# Patient Record
Sex: Female | Born: 1965
Health system: Southern US, Community
[De-identification: ages and names within clinical notes are randomized; demographics above are authoritative.]

## PROBLEM LIST (undated history)

## (undated) DIAGNOSIS — K7460 Unspecified cirrhosis of liver: Secondary | ICD-10-CM

## (undated) DIAGNOSIS — I864 Gastric varices: Secondary | ICD-10-CM

## (undated) DIAGNOSIS — R600 Localized edema: Secondary | ICD-10-CM

## (undated) DIAGNOSIS — R0683 Snoring: Secondary | ICD-10-CM

## (undated) DIAGNOSIS — K429 Umbilical hernia without obstruction or gangrene: Secondary | ICD-10-CM

## (undated) DIAGNOSIS — E119 Type 2 diabetes mellitus without complications: Secondary | ICD-10-CM

## (undated) DIAGNOSIS — Z460 Encounter for fitting and adjustment of spectacles and contact lenses: Secondary | ICD-10-CM

## (undated) DIAGNOSIS — K219 Gastro-esophageal reflux disease without esophagitis: Secondary | ICD-10-CM

## (undated) DIAGNOSIS — M2550 Pain in unspecified joint: Secondary | ICD-10-CM

## (undated) DIAGNOSIS — K743 Primary biliary cirrhosis: Secondary | ICD-10-CM

## (undated) DIAGNOSIS — T7840XA Allergy, unspecified, initial encounter: Secondary | ICD-10-CM

## (undated) DIAGNOSIS — R011 Cardiac murmur, unspecified: Secondary | ICD-10-CM

## (undated) DIAGNOSIS — D649 Anemia, unspecified: Secondary | ICD-10-CM

## (undated) DIAGNOSIS — Z91018 Allergy to other foods: Secondary | ICD-10-CM

## (undated) DIAGNOSIS — K5900 Constipation, unspecified: Secondary | ICD-10-CM

## (undated) DIAGNOSIS — E785 Hyperlipidemia, unspecified: Secondary | ICD-10-CM

## (undated) DIAGNOSIS — E559 Vitamin D deficiency, unspecified: Secondary | ICD-10-CM

## (undated) DIAGNOSIS — I10 Essential (primary) hypertension: Secondary | ICD-10-CM

## (undated) HISTORY — DX: Vitamin D deficiency, unspecified: E55.9

## (undated) HISTORY — DX: Hyperlipidemia, unspecified: E78.5

## (undated) HISTORY — PX: LEEP: SHX91

## (undated) HISTORY — DX: Essential (primary) hypertension: I10

## (undated) HISTORY — DX: Pain in unspecified joint: M25.50

## (undated) HISTORY — DX: Type 2 diabetes mellitus without complications: E11.9

## (undated) HISTORY — DX: Cardiac murmur, unspecified: R01.1

## (undated) HISTORY — DX: Localized edema: R60.0

## (undated) HISTORY — DX: Umbilical hernia without obstruction or gangrene: K42.9

## (undated) HISTORY — DX: Gastric varices: I86.4

## (undated) HISTORY — DX: Allergy to other foods: Z91.018

## (undated) HISTORY — DX: Allergy, unspecified, initial encounter: T78.40XA

## (undated) HISTORY — DX: Anemia, unspecified: D64.9

## (undated) HISTORY — PX: DILATION AND CURETTAGE OF UTERUS: SHX78

## (undated) HISTORY — PX: LIVER BIOPSY: SHX301

## (undated) HISTORY — DX: Primary biliary cirrhosis: K74.3

## (undated) HISTORY — DX: Gastro-esophageal reflux disease without esophagitis: K21.9

## (undated) HISTORY — DX: Unspecified cirrhosis of liver: K74.60

## (undated) HISTORY — DX: Constipation, unspecified: K59.00

---

## 1997-10-25 HISTORY — PX: TUBAL LIGATION: SHX77

## 2003-04-22 ENCOUNTER — Encounter: Payer: Self-pay | Admitting: Emergency Medicine

## 2003-04-22 ENCOUNTER — Emergency Department (HOSPITAL_COMMUNITY): Admission: EM | Admit: 2003-04-22 | Discharge: 2003-04-22 | Payer: Self-pay | Admitting: Emergency Medicine

## 2009-06-15 ENCOUNTER — Encounter: Admission: RE | Admit: 2009-06-15 | Discharge: 2009-06-15 | Payer: Self-pay | Admitting: Obstetrics and Gynecology

## 2010-06-16 ENCOUNTER — Encounter: Admission: RE | Admit: 2010-06-16 | Discharge: 2010-06-16 | Payer: Self-pay | Admitting: Obstetrics and Gynecology

## 2011-07-30 ENCOUNTER — Other Ambulatory Visit: Payer: Self-pay | Admitting: Obstetrics and Gynecology

## 2011-07-30 DIAGNOSIS — Z1231 Encounter for screening mammogram for malignant neoplasm of breast: Secondary | ICD-10-CM

## 2011-08-17 ENCOUNTER — Ambulatory Visit
Admission: RE | Admit: 2011-08-17 | Discharge: 2011-08-17 | Disposition: A | Discharge status: Home / Self Care | Payer: BC Managed Care – PPO | Source: Ambulatory Visit | Attending: Obstetrics and Gynecology | Admitting: Obstetrics and Gynecology

## 2011-08-17 DIAGNOSIS — Z1231 Encounter for screening mammogram for malignant neoplasm of breast: Secondary | ICD-10-CM

## 2012-03-25 ENCOUNTER — Other Ambulatory Visit (HOSPITAL_COMMUNITY)
Admission: RE | Admit: 2012-03-25 | Discharge: 2012-03-25 | Disposition: A | Discharge status: Home / Self Care | Payer: BC Managed Care – PPO | Source: Ambulatory Visit | Attending: Obstetrics and Gynecology | Admitting: Obstetrics and Gynecology

## 2012-03-25 ENCOUNTER — Other Ambulatory Visit: Payer: Self-pay | Admitting: Obstetrics and Gynecology

## 2012-03-25 DIAGNOSIS — Z124 Encounter for screening for malignant neoplasm of cervix: Secondary | ICD-10-CM | POA: Insufficient documentation

## 2012-04-23 DIAGNOSIS — Z0000 Encounter for general adult medical examination without abnormal findings: Secondary | ICD-10-CM | POA: Insufficient documentation

## 2012-04-29 ENCOUNTER — Encounter (INDEPENDENT_AMBULATORY_CARE_PROVIDER_SITE_OTHER): Payer: Self-pay | Admitting: General Surgery

## 2012-04-29 ENCOUNTER — Ambulatory Visit (INDEPENDENT_AMBULATORY_CARE_PROVIDER_SITE_OTHER): Payer: BC Managed Care – PPO | Admitting: General Surgery

## 2012-04-29 VITALS — BP 122/84 | HR 70 | Temp 97.6°F | Resp 16 | Ht 61.0 in | Wt 239.1 lb

## 2012-04-29 DIAGNOSIS — K429 Umbilical hernia without obstruction or gangrene: Secondary | ICD-10-CM

## 2012-04-29 NOTE — Progress Notes (Signed)
Patient ID: Susan [name], female   DOB: May 01, 1966, 46 y.o.   MRN: [id number]  Chief Complaint  Patient presents with  . Hernia    Peri-umbilical    HPI Susan [name] is a 46 y.o. female.  Umbilical Hernia HPI  Known hernia about one year, now more symptomatic.  No nausea, vomiting, or diarrhea.  No excruciating abdominal pain. Past Medical History  Diagnosis Date  . Hypertension   . Periumbilical hernia     Past Surgical History  Procedure Date  . Tubal ligation 10/1997  . Dilation and curettage of uterus 11/1995, 10/1997    Family History  Problem Relation Age of Onset  . Cancer Mother     breast  . Cancer Maternal Grandmother     lung  . Cancer Maternal Grandfather     lung    Social History History  Substance Use Topics  . Smoking status: Never Smoker   . Smokeless tobacco: Never Used  . Alcohol Use: No    Allergies  Allergen Reactions  . Sulfa Antibiotics Rash and Other (See Comments)    Severe dehydration    Current Outpatient Prescriptions  Medication Sig Dispense Refill  . amlodipine-olmesartan (AZOR) 10-20 MG per tablet Take 1 tablet by mouth daily.        Review of Systems Review of Systems  Constitutional: Negative.   HENT: Negative.   Eyes: Negative.   Respiratory: Negative.   Cardiovascular: Negative.   Gastrointestinal: Positive for abdominal pain (periumbilical).  Genitourinary: Negative.   Musculoskeletal: Negative.   Skin: Negative.   Neurological: Negative.   Hematological: Negative.   Psychiatric/Behavioral: Negative.     Blood pressure 122/84, pulse 70, temperature 97.6 F (36.4 C), temperature source Temporal, resp. rate 16, height 5\' 1"  (1.549 m), weight 239 lb 2 oz (108.466 kg).  Physical Exam Physical Exam  Constitutional: She appears well-developed and well-nourished.  HENT:  Head: Normocephalic and atraumatic.  Eyes: Conjunctivae and EOM are normal. Pupils are equal, round, and reactive to light.  Neck: Normal  range of motion. Neck supple.  Cardiovascular: Normal rate, regular rhythm and normal heart sounds.   Pulmonary/Chest: Effort normal and breath sounds normal.  Abdominal: Soft. Bowel sounds are normal. There is tenderness in the periumbilical area. A hernia (umbilical in the superior aspect) is present.    Musculoskeletal: Normal range of motion.  Skin: Skin is warm and dry.  Psychiatric: She has a normal mood and affect. Her behavior is normal. Thought content normal.    Data Reviewed None  Assessment    Umbilical hernia with symptoms    Plan    Umbilical hernia repair with mesh.   Patient to call back to schedule.       Cherylynn Ridges 04/29/2012, 12:54 PM

## 2012-09-05 ENCOUNTER — Other Ambulatory Visit: Payer: Self-pay | Admitting: Obstetrics and Gynecology

## 2012-09-05 DIAGNOSIS — Z803 Family history of malignant neoplasm of breast: Secondary | ICD-10-CM

## 2012-09-05 DIAGNOSIS — Z1231 Encounter for screening mammogram for malignant neoplasm of breast: Secondary | ICD-10-CM

## 2012-10-17 ENCOUNTER — Ambulatory Visit
Admission: RE | Admit: 2012-10-17 | Discharge: 2012-10-17 | Disposition: A | Discharge status: Home / Self Care | Payer: Self-pay | Source: Ambulatory Visit | Attending: Obstetrics and Gynecology | Admitting: Obstetrics and Gynecology

## 2012-10-17 DIAGNOSIS — Z1231 Encounter for screening mammogram for malignant neoplasm of breast: Secondary | ICD-10-CM

## 2012-10-17 DIAGNOSIS — Z803 Family history of malignant neoplasm of breast: Secondary | ICD-10-CM

## 2013-07-14 ENCOUNTER — Encounter (INDEPENDENT_AMBULATORY_CARE_PROVIDER_SITE_OTHER): Payer: Self-pay | Admitting: General Surgery

## 2013-07-14 ENCOUNTER — Ambulatory Visit (INDEPENDENT_AMBULATORY_CARE_PROVIDER_SITE_OTHER): Payer: BC Managed Care – PPO | Admitting: General Surgery

## 2013-07-14 VITALS — BP 160/110 | HR 68 | Temp 97.4°F | Resp 14 | Ht 60.0 in | Wt 242.4 lb

## 2013-07-14 DIAGNOSIS — K429 Umbilical hernia without obstruction or gangrene: Secondary | ICD-10-CM

## 2013-07-14 NOTE — Progress Notes (Signed)
Comes in with a continuing supraumbilical hernia. She's also moderately hypertensive with a blood pressure 160/110.  On examination the hernia appears to be slightly larger than previously. The last I saw her was in August of 2013. It is still mostly reducible but I could not reduce the hernia to where I could feel the fascial edges.  The patient wants surgical repair at this time we will go ahead and get it scheduled.  Her hypertension appears to be spurious but her last blood pressure on this visit 122/84. If this continues to be a problem we will get it reevaluated by primary care physician. 

## 2013-08-05 ENCOUNTER — Encounter (HOSPITAL_BASED_OUTPATIENT_CLINIC_OR_DEPARTMENT_OTHER): Payer: Self-pay | Admitting: *Deleted

## 2013-08-05 NOTE — Progress Notes (Signed)
08/05/13 1734  OBSTRUCTIVE SLEEP APNEA  Have you ever been diagnosed with sleep apnea through a sleep study? No  Do you snore loudly (loud enough to be heard through closed doors)?  1  Do you often feel tired, fatigued, or sleepy during the daytime? 1  Has anyone observed you stop breathing during your sleep? 1  Do you have, or are you being treated for high blood pressure? 0  BMI more than 35 kg/m2? 1  Age over 46 years old? 0  Gender: 0  Obstructive Sleep Apnea Score 4  Score 4 or greater  Results sent to PCP

## 2013-08-05 NOTE — Progress Notes (Signed)
Pt probably has sleep apnea-will go ahead and pack overnight bag-was told she may stay if she has problems-we would keep her safe, To come in for ekg per [facility]-

## 2013-08-06 ENCOUNTER — Encounter (HOSPITAL_BASED_OUTPATIENT_CLINIC_OR_DEPARTMENT_OTHER)
Admission: RE | Admit: 2013-08-06 | Discharge: 2013-08-06 | Disposition: A | Discharge status: Home / Self Care | Payer: BC Managed Care – PPO | Source: Ambulatory Visit | Attending: General Surgery | Admitting: General Surgery

## 2013-08-06 DIAGNOSIS — Z01810 Encounter for preprocedural cardiovascular examination: Secondary | ICD-10-CM | POA: Insufficient documentation

## 2013-08-11 ENCOUNTER — Encounter (HOSPITAL_BASED_OUTPATIENT_CLINIC_OR_DEPARTMENT_OTHER): Payer: BC Managed Care – PPO | Admitting: Certified Registered Nurse Anesthetist

## 2013-08-11 ENCOUNTER — Ambulatory Visit (HOSPITAL_BASED_OUTPATIENT_CLINIC_OR_DEPARTMENT_OTHER): Payer: BC Managed Care – PPO | Admitting: Certified Registered Nurse Anesthetist

## 2013-08-11 ENCOUNTER — Encounter (HOSPITAL_BASED_OUTPATIENT_CLINIC_OR_DEPARTMENT_OTHER)
Admission: RE | Disposition: A | Discharge status: Home / Self Care | Payer: Self-pay | Source: Ambulatory Visit | Attending: General Surgery

## 2013-08-11 ENCOUNTER — Ambulatory Visit (HOSPITAL_BASED_OUTPATIENT_CLINIC_OR_DEPARTMENT_OTHER)
Admission: RE | Admit: 2013-08-11 | Discharge: 2013-08-11 | Disposition: A | Discharge status: Home / Self Care | Payer: BC Managed Care – PPO | Source: Ambulatory Visit | Attending: General Surgery | Admitting: General Surgery

## 2013-08-11 ENCOUNTER — Encounter (HOSPITAL_BASED_OUTPATIENT_CLINIC_OR_DEPARTMENT_OTHER): Payer: Self-pay

## 2013-08-11 DIAGNOSIS — E119 Type 2 diabetes mellitus without complications: Secondary | ICD-10-CM | POA: Insufficient documentation

## 2013-08-11 DIAGNOSIS — K439 Ventral hernia without obstruction or gangrene: Secondary | ICD-10-CM

## 2013-08-11 DIAGNOSIS — I10 Essential (primary) hypertension: Secondary | ICD-10-CM | POA: Insufficient documentation

## 2013-08-11 DIAGNOSIS — K429 Umbilical hernia without obstruction or gangrene: Secondary | ICD-10-CM

## 2013-08-11 HISTORY — DX: Snoring: R06.83

## 2013-08-11 HISTORY — PX: UMBILICAL HERNIA REPAIR: SHX196

## 2013-08-11 HISTORY — DX: Encounter for fitting and adjustment of spectacles and contact lenses: Z46.0

## 2013-08-11 LAB — POCT I-STAT, CHEM 8
BUN: 10 mg/dL (ref 6–23)
Calcium, Ion: 1.06 mmol/L — ABNORMAL LOW (ref 1.12–1.23)
Chloride: 104 mEq/L (ref 96–112)
Creatinine, Ser: 0.9 mg/dL (ref 0.50–1.10)
Glucose, Bld: 161 mg/dL — ABNORMAL HIGH (ref 70–99)
HCT: 45 % (ref 36.0–46.0)
Hemoglobin: 15.3 g/dL — ABNORMAL HIGH (ref 12.0–15.0)
Potassium: 4.4 mEq/L (ref 3.5–5.1)
Sodium: 137 mEq/L (ref 135–145)
TCO2: 25 mmol/L (ref 0–100)

## 2013-08-11 LAB — GLUCOSE, CAPILLARY: Glucose-Capillary: 205 mg/dL — ABNORMAL HIGH (ref 70–99)

## 2013-08-11 SURGERY — REPAIR, HERNIA, UMBILICAL, ADULT
Anesthesia: General | Site: Abdomen | Wound class: Clean

## 2013-08-11 MED ORDER — OXYCODONE-ACETAMINOPHEN 5-325 MG PO TABS: 1.0000 | ORAL_TABLET | ORAL | Status: DC | PRN

## 2013-08-11 MED ORDER — SUCCINYLCHOLINE CHLORIDE 20 MG/ML IJ SOLN
INTRAMUSCULAR | Status: DC | PRN
  Administered 2013-08-11: 100 mg via INTRAVENOUS

## 2013-08-11 MED ORDER — KETOROLAC TROMETHAMINE 30 MG/ML IJ SOLN: 15.0000 mg | Freq: Once | INTRAMUSCULAR | Status: DC | PRN

## 2013-08-11 MED ORDER — MIDAZOLAM HCL 5 MG/5ML IJ SOLN
INTRAMUSCULAR | Status: DC | PRN
  Administered 2013-08-11: 2 mg via INTRAVENOUS

## 2013-08-11 MED ORDER — FENTANYL CITRATE 0.05 MG/ML IJ SOLN: 50.0000 ug | INTRAMUSCULAR | Status: DC | PRN

## 2013-08-11 MED ORDER — HYDROMORPHONE HCL PF 1 MG/ML IJ SOLN
0.2500 mg | INTRAMUSCULAR | Status: DC | PRN
  Administered 2013-08-11 (×4): 0.5 mg via INTRAVENOUS

## 2013-08-11 MED ORDER — ONDANSETRON HCL 4 MG/2ML IJ SOLN: 4.0000 mg | Freq: Once | INTRAMUSCULAR | Status: DC | PRN

## 2013-08-11 MED ORDER — FENTANYL CITRATE 0.05 MG/ML IJ SOLN
INTRAMUSCULAR | Status: DC | PRN
  Administered 2013-08-11: 100 ug via INTRAVENOUS

## 2013-08-11 MED ORDER — CHLORHEXIDINE GLUCONATE 4 % EX LIQD: 1.0000 "application " | Freq: Once | CUTANEOUS | Status: DC

## 2013-08-11 MED ORDER — CEFAZOLIN SODIUM-DEXTROSE 2-3 GM-% IV SOLR
2.0000 g | INTRAVENOUS | Status: AC
  Administered 2013-08-11: 2 g via INTRAVENOUS

## 2013-08-11 MED ORDER — HYDROMORPHONE HCL PF 1 MG/ML IJ SOLN
INTRAMUSCULAR | Status: AC
  Filled 2013-08-11: qty 1

## 2013-08-11 MED ORDER — EPHEDRINE SULFATE 50 MG/ML IJ SOLN
INTRAMUSCULAR | Status: DC | PRN
  Administered 2013-08-11: 10 mg via INTRAVENOUS

## 2013-08-11 MED ORDER — SODIUM CHLORIDE 0.9 % IR SOLN
Status: DC | PRN
  Administered 2013-08-11: 09:00:00

## 2013-08-11 MED ORDER — LABETALOL HCL 5 MG/ML IV SOLN
INTRAVENOUS | Status: AC
  Filled 2013-08-11: qty 4

## 2013-08-11 MED ORDER — PHENYLEPHRINE HCL 10 MG/ML IJ SOLN
INTRAMUSCULAR | Status: DC | PRN
  Administered 2013-08-11 (×2): 40 ug via INTRAVENOUS

## 2013-08-11 MED ORDER — LABETALOL HCL 5 MG/ML IV SOLN
5.0000 mg | INTRAVENOUS | Status: DC | PRN
  Administered 2013-08-11 (×2): 5 mg via INTRAVENOUS

## 2013-08-11 MED ORDER — LIDOCAINE HCL (CARDIAC) 20 MG/ML IV SOLN
INTRAVENOUS | Status: DC | PRN
  Administered 2013-08-11: 75 mg via INTRAVENOUS

## 2013-08-11 MED ORDER — MIDAZOLAM HCL 2 MG/2ML IJ SOLN: 1.0000 mg | INTRAMUSCULAR | Status: DC | PRN

## 2013-08-11 MED ORDER — PROPOFOL 10 MG/ML IV EMUL
INTRAVENOUS | Status: AC
  Filled 2013-08-11: qty 50

## 2013-08-11 MED ORDER — LACTATED RINGERS IV SOLN
INTRAVENOUS | Status: DC
  Administered 2013-08-11 (×2): via INTRAVENOUS

## 2013-08-11 MED ORDER — CEFAZOLIN SODIUM 1-5 GM-% IV SOLN
INTRAVENOUS | Status: AC
  Filled 2013-08-11: qty 100

## 2013-08-11 MED ORDER — PROPOFOL 10 MG/ML IV BOLUS
INTRAVENOUS | Status: DC | PRN
  Administered 2013-08-11: 250 mg via INTRAVENOUS
  Administered 2013-08-11: 200 mg via INTRAVENOUS

## 2013-08-11 MED ORDER — ONDANSETRON HCL 4 MG/2ML IJ SOLN
INTRAMUSCULAR | Status: DC | PRN
  Administered 2013-08-11: 4 mg via INTRAVENOUS

## 2013-08-11 MED ORDER — FENTANYL CITRATE 0.05 MG/ML IJ SOLN
INTRAMUSCULAR | Status: AC
  Filled 2013-08-11: qty 6

## 2013-08-11 MED ORDER — MIDAZOLAM HCL 2 MG/2ML IJ SOLN
INTRAMUSCULAR | Status: AC
  Filled 2013-08-11: qty 2

## 2013-08-11 MED ORDER — BUPIVACAINE HCL (PF) 0.5 % IJ SOLN
INTRAMUSCULAR | Status: AC
  Filled 2013-08-11: qty 30

## 2013-08-11 MED ORDER — LABETALOL HCL 5 MG/ML IV SOLN
INTRAVENOUS | Status: DC | PRN
  Administered 2013-08-11: 10 mg via INTRAVENOUS

## 2013-08-11 MED ORDER — BUPIVACAINE HCL (PF) 0.5 % IJ SOLN
INTRAMUSCULAR | Status: DC | PRN
  Administered 2013-08-11: 9 mL

## 2013-08-11 SURGICAL SUPPLY — 66 items
BAG DECANTER FOR FLEXI CONT (MISCELLANEOUS) ×3 IMPLANT
BINDER ABD UNIV 12 30-45 (MISCELLANEOUS) ×2 IMPLANT
BINDER ABDOMINAL 12 (MISCELLANEOUS) ×3
BLADE SURG 10 STRL SS (BLADE) ×3 IMPLANT
BLADE SURG 15 STRL LF DISP TIS (BLADE) ×2 IMPLANT
BLADE SURG 15 STRL SS (BLADE) ×1
BLADE SURG ROTATE 9660 (MISCELLANEOUS) IMPLANT
CANISTER SUCT 1200ML W/VALVE (MISCELLANEOUS) ×3 IMPLANT
CHLORAPREP W/TINT 26ML (MISCELLANEOUS) ×3 IMPLANT
CLEANER CAUTERY TIP 5X5 PAD (MISCELLANEOUS) ×2 IMPLANT
CONT SPEC 4OZ CLIKSEAL STRL BL (MISCELLANEOUS) IMPLANT
COVER MAYO STAND STRL (DRAPES) ×3 IMPLANT
COVER TABLE BACK 60X90 (DRAPES) ×3 IMPLANT
DECANTER SPIKE VIAL GLASS SM (MISCELLANEOUS) IMPLANT
DERMABOND ADVANCED (GAUZE/BANDAGES/DRESSINGS) ×1
DERMABOND ADVANCED .7 DNX12 (GAUZE/BANDAGES/DRESSINGS) ×2 IMPLANT
DRAPE LAPAROTOMY TRNSV 102X78 (DRAPE) ×3 IMPLANT
DRAPE UTILITY XL STRL (DRAPES) ×3 IMPLANT
DRSG TEGADERM 2-3/8X2-3/4 SM (GAUZE/BANDAGES/DRESSINGS) IMPLANT
DRSG TEGADERM 4X4.75 (GAUZE/BANDAGES/DRESSINGS) ×6 IMPLANT
ELECT REM PT RETURN 9FT ADLT (ELECTROSURGICAL) ×3
ELECTRODE REM PT RTRN 9FT ADLT (ELECTROSURGICAL) ×2 IMPLANT
GLOVE BIO SURGEON STRL SZ 6.5 (GLOVE) ×3 IMPLANT
GLOVE BIOGEL PI IND STRL 7.0 (GLOVE) ×2 IMPLANT
GLOVE BIOGEL PI IND STRL 7.5 (GLOVE) ×2 IMPLANT
GLOVE BIOGEL PI IND STRL 8 (GLOVE) ×2 IMPLANT
GLOVE BIOGEL PI INDICATOR 7.0 (GLOVE) ×1
GLOVE BIOGEL PI INDICATOR 7.5 (GLOVE) ×1
GLOVE BIOGEL PI INDICATOR 8 (GLOVE) ×1
GLOVE ECLIPSE 7.5 STRL STRAW (GLOVE) ×3 IMPLANT
GLOVE SURG SS PI 7.5 STRL IVOR (GLOVE) ×3 IMPLANT
GOWN PREVENTION PLUS XLARGE (GOWN DISPOSABLE) ×6 IMPLANT
GOWN PREVENTION PLUS XXLARGE (GOWN DISPOSABLE) ×3 IMPLANT
NEEDLE HYPO 25X1 1.5 SAFETY (NEEDLE) ×3 IMPLANT
NS IRRIG 1000ML POUR BTL (IV SOLUTION) ×3 IMPLANT
PACK BASIN DAY SURGERY FS (CUSTOM PROCEDURE TRAY) ×3 IMPLANT
PAD CLEANER CAUTERY TIP 5X5 (MISCELLANEOUS) ×1
PENCIL BUTTON HOLSTER BLD 10FT (ELECTRODE) ×3 IMPLANT
SLEEVE SCD COMPRESS KNEE MED (MISCELLANEOUS) ×3 IMPLANT
SPONGE INTESTINAL PEANUT (DISPOSABLE) IMPLANT
SPONGE LAP 4X18 X RAY DECT (DISPOSABLE) ×3 IMPLANT
STAPLER VISISTAT 35W (STAPLE) IMPLANT
STRIP CLOSURE SKIN 1/2X4 (GAUZE/BANDAGES/DRESSINGS) ×3 IMPLANT
SUT ETHIBOND 0 MO6 C/R (SUTURE) IMPLANT
SUT MNCRL AB 4-0 PS2 18 (SUTURE) ×3 IMPLANT
SUT NOVA NAB DX-16 0-1 5-0 T12 (SUTURE) ×9 IMPLANT
SUT NOVA NAB GS-21 0 18 T12 DT (SUTURE) IMPLANT
SUT PROLENE 0 CT 2 (SUTURE) IMPLANT
SUT PROLENE 1 CT (SUTURE) ×6 IMPLANT
SUT VIC AB 3-0 FS2 27 (SUTURE) IMPLANT
SUT VIC AB 3-0 SH 27 (SUTURE) ×1
SUT VIC AB 3-0 SH 27X BRD (SUTURE) ×2 IMPLANT
SUT VIC AB 4-0 RB1 27 (SUTURE)
SUT VIC AB 4-0 RB1 27X BRD (SUTURE) IMPLANT
SUT VIC AB 4-0 SH 27 (SUTURE)
SUT VIC AB 4-0 SH 27XANBCTRL (SUTURE) IMPLANT
SUT VIC AB 5-0 PS2 18 (SUTURE) IMPLANT
SUT VICRYL 4-0 PS2 18IN ABS (SUTURE) IMPLANT
SUT VICRYL AB 2 0 TIE (SUTURE) ×6 IMPLANT
SUT VICRYL AB 2 0 TIES (SUTURE) ×3
SYR BULB 3OZ (MISCELLANEOUS) ×3 IMPLANT
SYR CONTROL 10ML LL (SYRINGE) ×3 IMPLANT
TOWEL OR 17X24 6PK STRL BLUE (TOWEL DISPOSABLE) ×3 IMPLANT
TOWEL OR NON WOVEN STRL DISP B (DISPOSABLE) IMPLANT
TUBE CONNECTING 20X1/4 (TUBING) ×3 IMPLANT
YANKAUER SUCT BULB TIP NO VENT (SUCTIONS) ×3 IMPLANT

## 2013-08-11 NOTE — Transfer of Care (Signed)
Immediate Anesthesia Transfer of Care Note  Patient: Susan [name]  Procedure(s) Performed: Procedure(s): HERNIA REPAIR UMBILICAL ADULT (N/A)  Patient Location: PACU  Anesthesia Type:General  Level of Consciousness: awake and alert   Airway & Oxygen Therapy: Patient Spontanous Breathing and Patient connected to face mask oxygen  Post-op Assessment: Report given to PACU RN and Post -op Vital signs reviewed and stable  Post vital signs: Reviewed and stable  Complications: No apparent anesthesia complications

## 2013-08-11 NOTE — H&P (View-Only) (Signed)
Comes in with a continuing supraumbilical hernia. She's also moderately hypertensive with a blood pressure 160/110.  On examination the hernia appears to be slightly larger than previously. The last I saw her was in August of 2013. It is still mostly reducible but I could not reduce the hernia to where I could feel the fascial edges.  The patient wants surgical repair at this time we will go ahead and get it scheduled.  Her hypertension appears to be spurious but her last blood pressure on this visit 122/84. If this continues to be a problem we will get it reevaluated by primary care physician.

## 2013-08-11 NOTE — Op Note (Signed)
OPERATIVE REPORT  DATE OF OPERATION: 08/11/2013  PATIENT:  Norris Cross  47 y.o. female  PRE-OPERATIVE DIAGNOSIS:  Symptomatic umbilical hernia  POST-OPERATIVE DIAGNOSIS:  Symptomatic periumbilical ventral hernia  PROCEDURE:  Procedure(s): HERNIA REPAIR Peri-UMBILICAL Ventral ADULT  SURGEON:  Surgeon(s): Cherylynn Ridges, MD  ASSISTANT: Salomon Fick  ANESTHESIA:   general  EBL: <30 ml  BLOOD ADMINISTERED: none  DRAINS: none   SPECIMEN:  No Specimen  COUNTS CORRECT:  YES  PROCEDURE DETAILS: The patient was taken to the operating room and placed on the table in the supine position. After an adequate general laryngeal airway anesthetic was administered she was prepped and draped in the usual sterile manner.  After proper time out was performed identifying the patient and the procedure to be performed we marked the patient's abdomen in the supraumbilical area using a marking pen. The incision line was approximately 9 cm long. We made the incision in the supraumbilical area in a curvilinear manner down to subcutaneous tissue. We dissected out the large hernia sac from the subumbilical areaUsing electrocautery. We circumferentially isolated the hernia sac down to the fascial level. There was significant scar tissue from patient's previous operation in the periumbilical area from a previous laparoscopy.  Once we had the hernia defect isolated circumferentially we cut into the sac at the level of the fascia using electrocautery. Contained within the hernia sac was omentum and mesentery and small bowel. We resected some of the excess omentum and fatty tissue and allowed that to fall back into the peritoneal cavity making sure that bleeding structures were ligated with 2-0 Vicryl ties.  Care was taken not to injure the bowel. Once the fascia defect was isolated and the bowel was allowed to fall back into the peritoneal cavity we remain was an approximately 7 x 5 cm defect which we subsequently  closed.  Because of the patient's size and the tendency to be a diabetic and hypertensive is felt as though her risk for recurrence in the future was great. That being the case she was also at risk for infection because of her diabetes. I did not want Add meshto this patient. Therefore the hernia was repaired using interrupted simple stitches of #1 Novafil in a transverse manner. We subsequently reinforce his primary closure with a running #1 Prolene suture back-and-forth tied down on top of the interrupted closure. We subsequently irrigated the area with antibiotic solution. 3-0 Vicryl was used in reapproximate the subcutaneous tissue. We then injected cortisone Marcaine without epinephrine into the subcutaneous tissue. We then closed the skin using running subcuticular stitch of 4-0 Monocryl.  All needle counts, sponge count counts and instrument counts were correct.  PATIENT DISPOSITION:  PACU - hemodynamically stable.   Cherylynn Ridges 11/18/20149:41 AM

## 2013-08-11 NOTE — Interval H&P Note (Signed)
History and Physical Interval Note:  08/11/2013 7:35 AM  Norris Cross  has presented today for surgery, with the diagnosis of Symptomatic umbilical hernia  The various methods of treatment have been discussed with the patient and family. After consideration of risks, benefits and other options for treatment, the patient has consented to  Procedure(s): HERNIA REPAIR UMBILICAL ADULT (N/A) INSERTION OF MESH (N/A) as a surgical intervention .  The patient's history has been reviewed, patient examined, no change in status, stable for surgery.  I have reviewed the patient's chart and labs.  Questions were answered to the patient's satisfaction.    Patient had significant diastolic hypertension on arrival today.  Also, her glucose was elevated.  She was encouraged to see her PCP and to lose weight.  She understand.  Will be able to go through with surgery today.  Marta Lamas. Gae Bon, MD, FACS [phone] [facility] Surgery   Bryttney Netzer, Marta Lamas

## 2013-08-11 NOTE — Anesthesia Postprocedure Evaluation (Signed)
  Anesthesia Post-op Note  Patient: Susan [name]  Procedure(s) Performed: Procedure(s): HERNIA REPAIR UMBILICAL ADULT (N/A)  Patient Location: PACU  Anesthesia Type:General  Level of Consciousness: awake, alert  and oriented  Airway and Oxygen Therapy: Patient Spontanous Breathing and Patient connected to nasal cannula oxygen  Post-op Pain: mild  Post-op Assessment: Post-op Vital signs reviewed, Patient's Cardiovascular Status Stable, Respiratory Function Stable, Patent Airway, No signs of Nausea or vomiting and Pain level controlled  Post-op Vital Signs: stable  Complications: No apparent anesthesia complications

## 2013-08-11 NOTE — Anesthesia Preprocedure Evaluation (Signed)
Anesthesia Evaluation  Patient identified by MRN, date of birth, ID band Patient awake    Reviewed: Allergy & Precautions, H&P , NPO status , Patient's Chart, lab work & pertinent test results  Airway Mallampati: II TM Distance: >3 FB Neck ROM: Full    Dental  (+) Teeth Intact and Dental Advisory Given   Pulmonary  breath sounds clear to auscultation        Cardiovascular hypertension, Rhythm:Regular Rate:Normal     Neuro/Psych    GI/Hepatic   Endo/Other    Renal/GU      Musculoskeletal   Abdominal (+) + obese,   Peds  Hematology   Anesthesia Other Findings   Reproductive/Obstetrics                           Anesthesia Physical Anesthesia Plan  ASA: III  Anesthesia Plan: General   Post-op Pain Management:    Induction: Intravenous  Airway Management Planned: Oral ETT  Additional Equipment:   Intra-op Plan:   Post-operative Plan: Extubation in OR  Informed Consent: I have reviewed the patients History and Physical, chart, labs and discussed the procedure including the risks, benefits and alternatives for the proposed anesthesia with the patient or authorized representative who has indicated his/her understanding and acceptance.   Dental advisory given  Plan Discussed with: CRNA and Anesthesiologist  Anesthesia Plan Comments: (Ventral Hernia Hypertension, not compliant with meds,  GERD Obesity  Plan GA with oral ETT  Kipp Brood, MD)        Anesthesia Quick Evaluation

## 2013-08-11 NOTE — Anesthesia Procedure Notes (Addendum)
Procedure Name: LMA Insertion Date/Time: 08/11/2013 7:51 AM Performed by: Zenia Resides D Pre-anesthesia Checklist: Patient identified, Emergency Drugs available, Suction available and Patient being monitored Patient Re-evaluated:Patient Re-evaluated prior to inductionOxygen Delivery Method: Circle System Utilized Preoxygenation: Pre-oxygenation with 100% oxygen Intubation Type: IV induction Ventilation: Mask ventilation without difficulty LMA: LMA inserted LMA Size: 4.0 Number of attempts: 1 Airway Equipment and Method: bite block Placement Confirmation: positive ETCO2 Tube secured with: Tape Dental Injury: Teeth and Oropharynx as per pre-operative assessment    Procedure Name: Intubation Date/Time: 08/11/2013 8:08 AM Performed by: Zenia Resides D Pre-anesthesia Checklist: Patient identified, Emergency Drugs available, Suction available and Patient being monitored Patient Re-evaluated:Patient Re-evaluated prior to inductionOxygen Delivery Method: Circle System Utilized Preoxygenation: Pre-oxygenation with 100% oxygen Intubation Type: IV induction Ventilation: Mask ventilation without difficulty Grade View: Grade II Tube type: Oral Number of attempts: 1 Airway Equipment and Method: stylet,  oral airway and Video-laryngoscopy Placement Confirmation: ETT inserted through vocal cords under direct vision,  positive ETCO2 and breath sounds checked- equal and bilateral Secured at: 22 cm Tube secured with: Tape Dental Injury: Teeth and Oropharynx as per pre-operative assessment and Bloody posterior oropharynx

## 2013-08-12 ENCOUNTER — Encounter (HOSPITAL_BASED_OUTPATIENT_CLINIC_OR_DEPARTMENT_OTHER): Payer: Self-pay | Admitting: General Surgery

## 2013-08-12 ENCOUNTER — Telehealth (INDEPENDENT_AMBULATORY_CARE_PROVIDER_SITE_OTHER): Payer: Self-pay | Admitting: *Deleted

## 2013-08-12 NOTE — Telephone Encounter (Signed)
Husband called in stating that Percocet is not helping with the pain and asking for something stronger.  Explained that the Percocet is the strongest pain medication we prescribe for this type of surgery.  Encourage per protocol for patient to try to add in Ibuprofen 800mg  every 8 hours as needed for pain.  Explained this helps with inflammation and helps in different ways so maybe adding this in would help.  Husband states understanding and agreeable at this time.

## 2013-08-16 ENCOUNTER — Telehealth (INDEPENDENT_AMBULATORY_CARE_PROVIDER_SITE_OTHER): Payer: Self-pay | Admitting: General Surgery

## 2013-08-16 NOTE — Telephone Encounter (Signed)
Pt called because of redness at her umbilical hernia incision.  She wasn't sure if it was bruising or bright red.  She also had some temperatures of 100-100.5 and some dizziness.  Pain was controlled with ibuprofen.  No n/v.  Had a BM.      I advised her to come to urgent office tomorrow.    I will send messages to dr. Lindie Spruce and his nurse.  i also advised her to drink plenty of fluids.

## 2013-08-17 ENCOUNTER — Ambulatory Visit (INDEPENDENT_AMBULATORY_CARE_PROVIDER_SITE_OTHER): Payer: BC Managed Care – PPO | Admitting: General Surgery

## 2013-08-17 ENCOUNTER — Encounter (INDEPENDENT_AMBULATORY_CARE_PROVIDER_SITE_OTHER): Payer: Self-pay | Admitting: General Surgery

## 2013-08-17 VITALS — BP 154/112 | HR 88 | Temp 97.2°F | Resp 25 | Ht 60.0 in | Wt 235.0 lb

## 2013-08-17 DIAGNOSIS — R0602 Shortness of breath: Secondary | ICD-10-CM | POA: Insufficient documentation

## 2013-08-17 DIAGNOSIS — K429 Umbilical hernia without obstruction or gangrene: Secondary | ICD-10-CM

## 2013-08-17 NOTE — Progress Notes (Signed)
Subjective:     Patient ID: Susan [name], female   DOB: [birth year], 47 y.o.   MRN: [id number]  HPI The patient is a 47 year old black female who is 6 days status post umbilical hernia repair by Dr. Lindie Spruce. She states she had some fever over the weekend but this has resolved. Her husband was concerned that there may be some redness near the incision. Her main complaint is that she feels a little bit short of breath. She has not eaten very much. Her bowels move yesterday for the first time. She has not had any vomiting since surgery.  Review of Systems     Objective:   Physical Exam On exam her abdomen is soft with minimal tenderness. There is no distention. Her incision looks good. There is no surrounding cellulitis. Her lungs are clear bilaterally with no use of accessory respiratory muscles    Assessment:     The patient is 6 days status post umbilical hernia repair     Plan:     At this point I will plan to check a chest x-ray to begin working up her shortness of breath. Her shortness of breath is mottled. She has no leg swelling to suggest clots. If her shortness of breath worsens then she will need a CT angiogram chest to rule out pulmonary emboli. If it gradually gets better then I think this is normal postoperative pain causing her to not take deep breaths.

## 2013-08-17 NOTE — Patient Instructions (Signed)
Call if shortness of breath worsens

## 2013-08-18 ENCOUNTER — Ambulatory Visit
Admission: RE | Admit: 2013-08-18 | Discharge: 2013-08-18 | Disposition: A | Discharge status: Home / Self Care | Payer: BC Managed Care – PPO | Source: Ambulatory Visit | Attending: General Surgery | Admitting: General Surgery

## 2013-08-18 DIAGNOSIS — R0602 Shortness of breath: Secondary | ICD-10-CM

## 2013-09-01 ENCOUNTER — Ambulatory Visit (INDEPENDENT_AMBULATORY_CARE_PROVIDER_SITE_OTHER): Payer: BC Managed Care – PPO | Admitting: General Surgery

## 2013-09-01 ENCOUNTER — Encounter (INDEPENDENT_AMBULATORY_CARE_PROVIDER_SITE_OTHER): Payer: Self-pay | Admitting: General Surgery

## 2013-09-01 ENCOUNTER — Encounter (INDEPENDENT_AMBULATORY_CARE_PROVIDER_SITE_OTHER): Payer: Self-pay

## 2013-09-01 VITALS — BP 158/92 | HR 68 | Temp 98.3°F | Resp 16 | Ht 60.0 in | Wt 236.8 lb

## 2013-09-01 DIAGNOSIS — Z09 Encounter for follow-up examination after completed treatment for conditions other than malignant neoplasm: Secondary | ICD-10-CM | POA: Insufficient documentation

## 2013-09-01 NOTE — Progress Notes (Signed)
The patient is doing well although started complaining of some tenderness and pain in her right lower quadrant. This happened last night as she was laying in bed. I postulated that this is secondary to spasm from sutures placed in the fascia over on that side.  A wound has healed well with no evidence of infection. The firmness around the wound likely represents normal postoperative scarring. She is otherwise doing well. Umbilicus is viable and intact.  She is return to see me on a when necessary basis. She is to continue to wear her binder for a total of 6 weeks from the time of surgery. She may need to get another one from a medical supply store.

## 2014-02-21 ENCOUNTER — Encounter (HOSPITAL_BASED_OUTPATIENT_CLINIC_OR_DEPARTMENT_OTHER): Payer: Self-pay | Admitting: Emergency Medicine

## 2014-02-21 ENCOUNTER — Emergency Department (HOSPITAL_BASED_OUTPATIENT_CLINIC_OR_DEPARTMENT_OTHER)
Admission: EM | Admit: 2014-02-21 | Discharge: 2014-02-21 | Disposition: A | Discharge status: Home / Self Care | Payer: BC Managed Care – PPO | Attending: Emergency Medicine | Admitting: Emergency Medicine

## 2014-02-21 ENCOUNTER — Emergency Department (HOSPITAL_BASED_OUTPATIENT_CLINIC_OR_DEPARTMENT_OTHER): Payer: BC Managed Care – PPO

## 2014-02-21 DIAGNOSIS — L0390 Cellulitis, unspecified: Secondary | ICD-10-CM

## 2014-02-21 DIAGNOSIS — Z3202 Encounter for pregnancy test, result negative: Secondary | ICD-10-CM | POA: Insufficient documentation

## 2014-02-21 DIAGNOSIS — I10 Essential (primary) hypertension: Secondary | ICD-10-CM | POA: Insufficient documentation

## 2014-02-21 DIAGNOSIS — F411 Generalized anxiety disorder: Secondary | ICD-10-CM | POA: Insufficient documentation

## 2014-02-21 DIAGNOSIS — L0201 Cutaneous abscess of face: Secondary | ICD-10-CM | POA: Insufficient documentation

## 2014-02-21 DIAGNOSIS — Z79899 Other long term (current) drug therapy: Secondary | ICD-10-CM | POA: Insufficient documentation

## 2014-02-21 DIAGNOSIS — D72829 Elevated white blood cell count, unspecified: Secondary | ICD-10-CM | POA: Insufficient documentation

## 2014-02-21 DIAGNOSIS — L03211 Cellulitis of face: Secondary | ICD-10-CM

## 2014-02-21 DIAGNOSIS — R229 Localized swelling, mass and lump, unspecified: Secondary | ICD-10-CM | POA: Insufficient documentation

## 2014-02-21 DIAGNOSIS — R220 Localized swelling, mass and lump, head: Secondary | ICD-10-CM

## 2014-02-21 DIAGNOSIS — K219 Gastro-esophageal reflux disease without esophagitis: Secondary | ICD-10-CM | POA: Insufficient documentation

## 2014-02-21 LAB — CBC WITH DIFFERENTIAL/PLATELET
Basophils Absolute: 0 10*3/uL (ref 0.0–0.1)
Basophils Relative: 0 % (ref 0–1)
Eosinophils Absolute: 0.4 10*3/uL (ref 0.0–0.7)
Eosinophils Relative: 4 % (ref 0–5)
HCT: 40.8 % (ref 36.0–46.0)
Hemoglobin: 13.9 g/dL (ref 12.0–15.0)
Lymphocytes Relative: 31 % (ref 12–46)
Lymphs Abs: 3.4 10*3/uL (ref 0.7–4.0)
MCH: 30.2 pg (ref 26.0–34.0)
MCHC: 34.1 g/dL (ref 30.0–36.0)
MCV: 88.5 fL (ref 78.0–100.0)
Monocytes Absolute: 0.7 10*3/uL (ref 0.1–1.0)
Monocytes Relative: 6 % (ref 3–12)
Neutro Abs: 6.3 10*3/uL (ref 1.7–7.7)
Neutrophils Relative %: 58 % (ref 43–77)
Platelets: 327 10*3/uL (ref 150–400)
RBC: 4.61 MIL/uL (ref 3.87–5.11)
RDW: 12.5 % (ref 11.5–15.5)
WBC: 10.9 10*3/uL — ABNORMAL HIGH (ref 4.0–10.5)

## 2014-02-21 LAB — BASIC METABOLIC PANEL
BUN: 10 mg/dL (ref 6–23)
CO2: 26 mEq/L (ref 19–32)
Calcium: 8.3 mg/dL — ABNORMAL LOW (ref 8.4–10.5)
Chloride: 102 mEq/L (ref 96–112)
Creatinine, Ser: 0.7 mg/dL (ref 0.50–1.10)
GFR calc Af Amer: >90 mL/min (ref >90)
GFR calc non Af Amer: >90 mL/min (ref >90)
Glucose, Bld: 165 mg/dL — ABNORMAL HIGH (ref 70–99)
Potassium: 3.4 mEq/L — ABNORMAL LOW (ref 3.7–5.3)
Sodium: 139 mEq/L (ref 137–147)

## 2014-02-21 LAB — PREGNANCY, URINE: Preg Test, Ur: NEGATIVE

## 2014-02-21 MED ORDER — IOHEXOL 300 MG/ML  SOLN: 100.0000 mL | Freq: Once | INTRAMUSCULAR | Status: DC | PRN

## 2014-02-21 MED ORDER — CLINDAMYCIN HCL 300 MG PO CAPS: 300.0000 mg | ORAL_CAPSULE | Freq: Three times a day (TID) | ORAL | Status: DC

## 2014-02-21 MED ORDER — IBUPROFEN 600 MG PO TABS: 600.0000 mg | ORAL_TABLET | Freq: Four times a day (QID) | ORAL | Status: DC | PRN

## 2014-02-21 MED ORDER — LABETALOL HCL 5 MG/ML IV SOLN
10.0000 mg | Freq: Once | INTRAVENOUS | Status: AC
  Administered 2014-02-21: 10 mg via INTRAVENOUS
  Filled 2014-02-21: qty 4

## 2014-02-21 MED ORDER — SODIUM CHLORIDE 0.9 % IV BOLUS (SEPSIS)
1000.0000 mL | Freq: Once | INTRAVENOUS | Status: AC
  Administered 2014-02-21: 1000 mL via INTRAVENOUS

## 2014-02-21 MED ORDER — IOHEXOL 350 MG/ML SOLN
100.0000 mL | Freq: Once | INTRAVENOUS | Status: AC | PRN
  Administered 2014-02-21: 100 mL via INTRAVENOUS

## 2014-02-21 NOTE — ED Provider Notes (Addendum)
TIME SEEN: 2:32 PM  CHIEF COMPLAINT: Left-sided facial swelling  HPI: Patient is a 48 y.o. F with history of hypertension who presents to the emergency Department left-sided facial swelling. She states she was eating breakfast at [employer] this morning when shed felt her left cheek and underneath her right ear along her neck was swollen. She states she began to arrival the side of her cheek and ear and noticed that this area was swollen. She did not have any tenderness here. No urothelial or warmth. No history of injury. She states she was seen at urgent care today and they instructed her to come to the emergency department for a CT scan to rule out infection. She denies any fevers or chills. No nausea, vomiting or diarrhea. No ear pain, sore throat, difficulty swallowing or breathing. No rash. No new medications. No swelling over time her lips. No facial numbness or weakness.  ROS: See HPI Constitutional: no fever  Eyes: no drainage  ENT: no runny nose   Cardiovascular:  no chest pain  Resp: no SOB  GI: no vomiting GU: no dysuria Integumentary: no rash  Allergy: no hives  Musculoskeletal: no leg swelling  Neurological: no slurred speech ROS otherwise negative  PAST MEDICAL HISTORY/PAST SURGICAL HISTORY:  Past Medical History  Diagnosis Date  . Hypertension   . Periumbilical hernia   . Snores   . Contact lens/glasses fitting     wears contacts or glasses  . GERD (gastroesophageal reflux disease)     occ pepcid ac    MEDICATIONS:  Prior to Admission medications   Medication Sig Start Date End Date Taking? Authorizing Provider  Ascorbic Acid (VITAMIN C) 1000 MG tablet Take 1,000 mg by mouth daily.    Historical Provider, MD  calcium carbonate (OS-CAL) 600 MG TABS tablet Take 600 mg by mouth 2 (two) times daily with a meal.    Historical Provider, MD  famotidine (PEPCID AC) 10 MG chewable tablet Chew 10 mg by mouth as needed for heartburn.    Historical Provider, MD  ibuprofen  (ADVIL,MOTRIN) 800 MG tablet Take 800 mg by mouth every 8 (eight) hours as needed.    Historical Provider, MD  Multiple Vitamins-Minerals (MULTIVITAMIN WITH MINERALS) tablet Take 1 tablet by mouth daily.    Historical Provider, MD    ALLERGIES:  Allergies  Allergen Reactions  . Sulfa Antibiotics Rash and Other (See Comments)    Severe dehydration  . Mushroom Extract Complex   . Shellfish Allergy   . Strawberry     SOCIAL HISTORY:  History  Substance Use Topics  . Smoking status: Never Smoker   . Smokeless tobacco: Never Used  . Alcohol Use: No     Comment: rare wine    FAMILY HISTORY: Family History  Problem Relation Age of Onset  . Cancer Mother     breast  . Cancer Maternal Grandmother     lung  . Cancer Maternal Grandfather     lung    EXAM: BP 223/108  Pulse 87  Temp(Src) 98.1 F (36.7 C) (Oral)  Resp 26  Ht 5' (1.524 m)  Wt 236 lb (107.049 kg)  BMI 46.09 kg/m2  SpO2 96% CONSTITUTIONAL: Alert and oriented and responds appropriately to questions. Well-appearing; well-nourished HEAD: Normocephalic EYES: Conjunctivae clear, PERRL ENT: normal nose; no rhinorrhea; moist mucous membranes; pharynx without lesions noted, TMs are clear bilaterally, no trismus or drooling, no dental abscess or caries noted, patient does have some soft tissue swelling with fluctuance to  the left cheek next of the ear and underneath the left ear with no associated erythema or warmth or induration or drainage NECK: Supple, no meningismus, no LAD  CARD: RRR; S1 and S2 appreciated; no murmurs, no clicks, no rubs, no gallops RESP: Normal chest excursion without splinting or tachypnea; breath sounds clear and equal bilaterally; no wheezes, no rhonchi, no rales,  ABD/GI: Normal bowel sounds; non-distended; soft, non-tender, no rebound, no guarding BACK:  The back appears normal and is non-tender to palpation, there is no CVA tenderness EXT: Normal ROM in all joints; non-tender to palpation; no  edema; normal capillary refill; no cyanosis    SKIN: Normal color for age and race; warm NEURO: Moves all extremities equally PSYCH: The patient's mood and manner are appropriate. Grooming and personal hygiene are appropriate.  MEDICAL DECISION MAKING: Patient here with sudden onset left-sided facial swelling. She has no angioedema. Her airway is patent. No difficulty swallowing. No obvious sign of infection. Her oropharynx is clear. There is incidental axis negative. TMs are clear bilaterally. She is hypertensive and states she has not been taking her medication and states she is feeling very anxious. Will recheck when patient is more relaxed. We'll obtain labs and a CT of the soft tissues of her neck to rule out infection.  ED PROGRESS: Patient is a mild leukocytosis with left shift. Her CT scan shows very minimal inflammation of her platysmas muscle on the left side compared to the right but only by 1 mm - d/w Dr. [place] Pink with radiology. May represent very early infection. Patient feels that her swelling has improved and this fluctuant area has decreased in size. Unclear etiology of this swelling but given possible infection with leukocytosis, will discharge with prescription for Clindamycin. Have discussed strict return precautions and supportive care instructions. Will have her followup with her primary care physician. Patient verbalizes understanding and is comfortable with plan.     [place], DO 02/21/14 [place], DO 02/21/14 1742

## 2014-02-21 NOTE — ED Notes (Signed)
Patient states that she was eating breakfast and her tongue began to feel numb,  Her left cheek started swelling and then she noticed swelling behind her left ear. Went to UC but was sent here for a CT because the dr felt there might be a cyst there

## 2014-02-21 NOTE — ED Notes (Signed)
Patient transported to CT via stretcher per tech. 

## 2014-02-21 NOTE — ED Notes (Signed)
MD at bedside. 

## 2014-02-21 NOTE — Discharge Instructions (Signed)
Possible Cellulitis Cellulitis is an infection of the skin and the tissue beneath it. The infected area is usually red and tender. Cellulitis occurs most often in the arms and lower legs.  CAUSES  Cellulitis is caused by bacteria that enter the skin through cracks or cuts in the skin. The most common types of bacteria that cause cellulitis are Staphylococcus and Streptococcus. SYMPTOMS   Redness and warmth.  Swelling.  Tenderness or pain.  Fever. DIAGNOSIS  Your caregiver can usually determine what is wrong based on a physical exam. Blood tests may also be done. TREATMENT  Treatment usually involves taking an antibiotic medicine. HOME CARE INSTRUCTIONS   Take your antibiotics as directed. Finish them even if you start to feel better.  Keep the infected arm or leg elevated to reduce swelling.  Apply a warm cloth to the affected area up to 4 times per day to relieve pain.  Only take over-the-counter or prescription medicines for pain, discomfort, or fever as directed by your caregiver.  Keep all follow-up appointments as directed by your caregiver. SEEK MEDICAL CARE IF:   You notice red streaks coming from the infected area.  Your red area gets larger or turns dark in color.  Your bone or joint underneath the infected area becomes painful after the skin has healed.  Your infection returns in the same area or another area.  You notice a swollen bump in the infected area.  You develop new symptoms. SEEK IMMEDIATE MEDICAL CARE IF:   You have a fever.  You feel very sleepy.  You develop vomiting or diarrhea.  You have a general ill feeling (malaise) with muscle aches and pains. MAKE SURE YOU:   Understand these instructions.  Will watch your condition.  Will get help right away if you are not doing well or get worse. Document Released: 06/20/2005 Document Revised: 03/11/2012 Document Reviewed: 11/26/2011 [facility] Patient Information 2014 [place].

## 2016-03-01 DIAGNOSIS — R6889 Other general symptoms and signs: Secondary | ICD-10-CM | POA: Diagnosis not present

## 2016-03-01 DIAGNOSIS — I10 Essential (primary) hypertension: Secondary | ICD-10-CM | POA: Diagnosis not present

## 2016-03-15 DIAGNOSIS — I10 Essential (primary) hypertension: Secondary | ICD-10-CM | POA: Diagnosis not present

## 2016-03-26 DIAGNOSIS — R809 Proteinuria, unspecified: Secondary | ICD-10-CM | POA: Diagnosis not present

## 2016-03-26 DIAGNOSIS — R1013 Epigastric pain: Secondary | ICD-10-CM | POA: Diagnosis not present

## 2016-03-26 DIAGNOSIS — I10 Essential (primary) hypertension: Secondary | ICD-10-CM | POA: Diagnosis not present

## 2016-03-30 DIAGNOSIS — R1013 Epigastric pain: Secondary | ICD-10-CM | POA: Diagnosis not present

## 2016-04-02 ENCOUNTER — Encounter: Payer: Self-pay | Admitting: Physician Assistant

## 2016-04-02 ENCOUNTER — Telehealth: Payer: Self-pay | Admitting: Physician Assistant

## 2016-04-02 NOTE — Telephone Encounter (Signed)
A user error has taken place.

## 2016-06-14 DIAGNOSIS — J019 Acute sinusitis, unspecified: Secondary | ICD-10-CM | POA: Diagnosis not present

## 2016-06-14 DIAGNOSIS — I10 Essential (primary) hypertension: Secondary | ICD-10-CM | POA: Diagnosis not present

## 2016-08-24 LAB — HM DIABETES EYE EXAM

## 2016-11-06 DIAGNOSIS — I10 Essential (primary) hypertension: Secondary | ICD-10-CM | POA: Diagnosis not present

## 2016-11-06 DIAGNOSIS — Z7689 Persons encountering health services in other specified circumstances: Secondary | ICD-10-CM | POA: Diagnosis not present

## 2016-11-06 DIAGNOSIS — R739 Hyperglycemia, unspecified: Secondary | ICD-10-CM | POA: Diagnosis not present

## 2016-11-06 LAB — HEMOGLOBIN A1C: Hemoglobin A1C: 9.8

## 2016-11-06 LAB — HEPATIC FUNCTION PANEL
ALT: 64 U/L — AB (ref 7–35)
AST: 42 U/L — AB (ref 13–35)

## 2016-11-06 LAB — BASIC METABOLIC PANEL: Glucose: 307 mg/dL

## 2016-11-20 DIAGNOSIS — E1165 Type 2 diabetes mellitus with hyperglycemia: Secondary | ICD-10-CM | POA: Diagnosis not present

## 2016-11-20 DIAGNOSIS — I10 Essential (primary) hypertension: Secondary | ICD-10-CM | POA: Diagnosis not present

## 2016-11-20 DIAGNOSIS — Z23 Encounter for immunization: Secondary | ICD-10-CM | POA: Diagnosis not present

## 2016-11-20 DIAGNOSIS — Z6837 Body mass index (BMI) 37.0-37.9, adult: Secondary | ICD-10-CM | POA: Diagnosis not present

## 2016-11-20 DIAGNOSIS — E6609 Other obesity due to excess calories: Secondary | ICD-10-CM | POA: Diagnosis not present

## 2017-01-22 ENCOUNTER — Ambulatory Visit (INDEPENDENT_AMBULATORY_CARE_PROVIDER_SITE_OTHER): Payer: BLUE CROSS/BLUE SHIELD | Admitting: Internal Medicine

## 2017-01-22 ENCOUNTER — Encounter: Payer: Self-pay | Admitting: Internal Medicine

## 2017-01-22 VITALS — BP 130/78 | HR 80 | Temp 98.3°F | Ht 61.25 in | Wt 200.0 lb

## 2017-01-22 DIAGNOSIS — E119 Type 2 diabetes mellitus without complications: Secondary | ICD-10-CM | POA: Diagnosis not present

## 2017-01-22 DIAGNOSIS — I10 Essential (primary) hypertension: Secondary | ICD-10-CM | POA: Diagnosis not present

## 2017-01-22 DIAGNOSIS — E1165 Type 2 diabetes mellitus with hyperglycemia: Secondary | ICD-10-CM | POA: Insufficient documentation

## 2017-01-22 DIAGNOSIS — K219 Gastro-esophageal reflux disease without esophagitis: Secondary | ICD-10-CM | POA: Diagnosis not present

## 2017-01-22 LAB — LIPID PANEL
Cholesterol: 378 mg/dL — ABNORMAL HIGH (ref 0–200)
HDL: 141.8 mg/dL (ref >39.00)
LDL Cholesterol: 209 mg/dL — ABNORMAL HIGH (ref 0–99)
NonHDL: 235.96
Total CHOL/HDL Ratio: 3
Triglycerides: 137 mg/dL (ref 0.0–149.0)
VLDL: 27.4 mg/dL (ref 0.0–40.0)

## 2017-01-22 LAB — CBC
HCT: 38.6 % (ref 36.0–46.0)
Hemoglobin: 12.7 g/dL (ref 12.0–15.0)
MCHC: 32.9 g/dL (ref 30.0–36.0)
MCV: 90.7 fl (ref 78.0–100.0)
Platelets: 327 10*3/uL (ref 150.0–400.0)
RBC: 4.26 Mil/uL (ref 3.87–5.11)
RDW: 13.5 % (ref 11.5–15.5)
WBC: 10.4 10*3/uL (ref 4.0–10.5)

## 2017-01-22 LAB — COMPREHENSIVE METABOLIC PANEL
ALT: 62 U/L — ABNORMAL HIGH (ref 0–35)
AST: 45 U/L — ABNORMAL HIGH (ref 0–37)
Albumin: 3.8 g/dL (ref 3.5–5.2)
Alkaline Phosphatase: 227 U/L — ABNORMAL HIGH (ref 39–117)
BUN: 10 mg/dL (ref 6–23)
CO2: 30 mEq/L (ref 19–32)
Calcium: 10 mg/dL (ref 8.4–10.5)
Chloride: 97 mEq/L (ref 96–112)
Creatinine, Ser: 0.74 mg/dL (ref 0.40–1.20)
GFR: 106.71 mL/min (ref >60.00)
Glucose, Bld: 179 mg/dL — ABNORMAL HIGH (ref 70–99)
Potassium: 4.1 mEq/L (ref 3.5–5.1)
Sodium: 132 mEq/L — ABNORMAL LOW (ref 135–145)
Total Bilirubin: 0.6 mg/dL (ref 0.2–1.2)
Total Protein: 8.6 g/dL — ABNORMAL HIGH (ref 6.0–8.3)

## 2017-01-22 LAB — HEMOGLOBIN A1C: Hgb A1c MFr Bld: 7.7 % — ABNORMAL HIGH (ref 4.6–6.5)

## 2017-01-22 MED ORDER — CEPHALEXIN 500 MG PO CAPS: 500.0000 mg | ORAL_CAPSULE | Freq: Three times a day (TID) | ORAL | 0 refills | Status: DC

## 2017-01-22 NOTE — Progress Notes (Signed)
HPI  Pt presents to the clinic today to establish care and for management of the conditions listed below. She is transferring care from Dr. Bernadette Hoit.  GERD: She reports this is intermittent. It is triggered by acidic and tomato based foods. She takes Tums or Rolaids as needed with good relief.  HTN: Her BP today is 130/78. She is taking Lisinopril and Norvasc as prescribed. ECG from 07/2013 reviewed.  DM 2: Her last A1C was 9.8%, 10/2016. She is taking Metformin as prescribed. She is not checking her sugars. She does not check her feet daily. Her last eye exam was 08/2017.  She does c/o swelling, warmth and redness of her left lower extremity. She noticed this 1 week ago after she fell on her knee. She denies calf pain. The swelling is affecting her ability to walk. She has not tried anything OTC for this.  Flu: 06/2015 Tetanus: ? 2015 Pneumovax: 10/2016 Pap Smear: < 5 years ago Mammogram: 09/2012 Colon Screening: never Vision Screening: annually Dentist: biannually  Past Medical History:  Diagnosis Date  . Contact lens/glasses fitting    wears contacts or glasses  . GERD (gastroesophageal reflux disease)    occ pepcid ac  . Hypertension   . Periumbilical hernia   . Snores     Current Outpatient Prescriptions  Medication Sig Dispense Refill  . Ascorbic Acid (VITAMIN C) 1000 MG tablet Take 1,000 mg by mouth daily.    . calcium carbonate (OS-CAL) 600 MG TABS tablet Take 600 mg by mouth 2 (two) times daily with a meal.    . famotidine (PEPCID AC) 10 MG chewable tablet Chew 10 mg by mouth as needed for heartburn.    Marland Kitchen ibuprofen (ADVIL,MOTRIN) 800 MG tablet Take 800 mg by mouth every 8 (eight) hours as needed.    . Multiple Vitamins-Minerals (MULTIVITAMIN WITH MINERALS) tablet Take 1 tablet by mouth daily.     No current facility-administered medications for this visit.     Allergies  Allergen Reactions  . Grapefruit Concentrate Other (See Comments)    blisters  . Sulfa  Antibiotics Rash and Other (See Comments)    Severe dehydration  . Mushroom Extract Complex   . Shellfish Allergy   . Strawberry Extract     Family History  Problem Relation Age of Onset  . Cancer Mother     breast  . Cancer Maternal Grandmother     lung  . Cancer Maternal Grandfather     lung    Social History   Social History  . Marital status: Married    Spouse name: N/A  . Number of children: N/A  . Years of education: N/A   Occupational History  . Not on file.   Social History Main Topics  . Smoking status: Never Smoker  . Smokeless tobacco: Never Used  . Alcohol use Yes     Comment: rare wine  . Drug use: No  . Sexual activity: Not on file   Other Topics Concern  . Not on file   Social History Narrative  . No narrative on file    ROS:  Constitutional: Denies fever, malaise, fatigue, headache or abrupt weight changes.  HEENT: Denies eye pain, eye redness, ear pain, ringing in the ears, wax buildup, runny nose, nasal congestion, bloody nose, or sore throat. Respiratory: Denies difficulty breathing, shortness of breath, cough or sputum production.   Cardiovascular: Pt reports swelling of LLE. Denies chest pain, chest tightness, palpitations or swelling in the hands.  Gastrointestinal: Pt  reports intermittent reflux. Denies abdominal pain, bloating, constipation, diarrhea or blood in the stool.  GU: Denies frequency, urgency, pain with urination, blood in urine, odor or discharge. Musculoskeletal: Denies decrease in range of motion, difficulty with gait, muscle pain or joint pain and swelling.  Skin: Pt reports redness and warmth of LLE. Denies rashes, lesions or ulcercations.  Neurological: Denies dizziness, difficulty with memory, difficulty with speech or problems with balance and coordination.  Psych: Denies anxiety, depression, SI/HI.  No other specific complaints in a complete review of systems (except as listed in HPI above).  PE:  BP 130/78    Pulse 80   Temp 98.3 F (36.8 C) (Oral)   Ht 5' 1.25" (1.556 m)   Wt 200 lb (90.7 kg)   LMP 12/26/2016   SpO2 98%   BMI 37.48 kg/m   Wt Readings from Last 3 Encounters:  01/22/17 200 lb (90.7 kg)  02/21/14 236 lb (107 kg)  09/01/13 236 lb 12.8 oz (107.4 kg)    General: Appears her stated age, obese in NAD. Skin: Dry and intact. No ulcerations noted. Redness and warmth along anterior shin, concerning for cellulitis. Cardiovascular: Normal rate and rhythm. S1,S2 noted.  Murmur noted. 1+ pitting edema LLE, trace pitting edema RLE. Pulmonary/Chest: Normal effort and positive vesicular breath sounds. No respiratory distress. No wheezes, rales or ronchi noted.  Abdomen: Soft and nontender. Normal bowel sounds, no bruits noted. No distention or masses noted. Musculoskeletal: Normal range of motion. Strength 5/5 BUE/BLE. No signs of joint swelling. No difficulty with gait.  Neurological: Alert and oriented. Cranial nerves II-XII grossly intact. Coordination normal.  Psychiatric: Mood and affect normal. Behavior is normal. Judgment and thought content normal.     BMET    Component Value Date/Time   NA 139 02/21/2014 1510   K 3.4 (L) 02/21/2014 1510   CL 102 02/21/2014 1510   CO2 26 02/21/2014 1510   GLUCOSE 165 (H) 02/21/2014 1510   BUN 10 02/21/2014 1510   CREATININE 0.70 02/21/2014 1510   CALCIUM 8.3 (L) 02/21/2014 1510   GFRNONAA >90 02/21/2014 1510   GFRAA >90 02/21/2014 1510    Lipid Panel  No results found for: CHOL, TRIG, HDL, CHOLHDL, VLDL, LDLCALC  CBC    Component Value Date/Time   WBC 10.9 (H) 02/21/2014 1442   RBC 4.61 02/21/2014 1442   HGB 13.9 02/21/2014 1442   HCT 40.8 02/21/2014 1442   PLT 327 02/21/2014 1442   MCV 88.5 02/21/2014 1442   MCH 30.2 02/21/2014 1442   MCHC 34.1 02/21/2014 1442   RDW 12.5 02/21/2014 1442   LYMPHSABS 3.4 02/21/2014 1442   MONOABS 0.7 02/21/2014 1442   EOSABS 0.4 02/21/2014 1442   BASOSABS 0.0 02/21/2014 1442    Hgb  A1C No results found for: HGBA1C   Assessment and Plan:  Cellulitis of Left Leg:  eRx for Keflex TID x 7 days Encouraged elevation  Make an appt for your annual exam [name], NP

## 2017-01-22 NOTE — Patient Instructions (Signed)
Cellulitis, Adult Cellulitis is a skin infection. The infected area is usually red and sore. This condition occurs most often in the arms and lower legs. It is very important to get treated for this condition. Follow these instructions at home:  Take over-the-counter and prescription medicines only as told by your doctor.  If you were prescribed an antibiotic medicine, take it as told by your doctor. Do not stop taking the antibiotic even if you start to feel better.  Drink enough fluid to keep your pee (urine) clear or pale yellow.  Do not touch or rub the infected area.  Raise (elevate) the infected area above the level of your heart while you are sitting or lying down.  Place warm or cold wet cloths (warm or cold compresses) on the infected area. Do this as told by your doctor.  Keep all follow-up visits as told by your doctor. This is important. These visits let your doctor make sure your infection is not getting worse. Contact a doctor if:  You have a fever.  Your symptoms do not get better after 1-2 days of treatment.  Your bone or joint under the infected area starts to hurt after the skin has healed.  Your infection comes back. This can happen in the same area or another area.  You have a swollen bump in the infected area.  You have new symptoms.  You feel ill and also have muscle aches and pains. Get help right away if:  Your symptoms get worse.  You feel very sleepy.  You throw up (vomit) or have watery poop (diarrhea) for a long time.  There are red streaks coming from the infected area.  Your red area gets larger.  Your red area turns darker. This information is not intended to replace advice given to you by your health care provider. Make sure you discuss any questions you have with your health care provider. Document Released: 02/27/2008 Document Revised: 02/16/2016 Document Reviewed: 07/20/2015 Elsevier Interactive Patient Education  2017 Elsevier  Inc.  

## 2017-01-22 NOTE — Assessment & Plan Note (Signed)
Controlled on Lisinopril and Norvasc Given edema, would like to stop Norvasc and change Lisinopril to Lisinopril HCT 20-12.5 Will obtain CMET first

## 2017-01-22 NOTE — Assessment & Plan Note (Signed)
Discussed avoiding foods that trigger reflux Encouraged weight loss Continue Tums or Rolaids OTC for now Will monitor for new or worsening symptoms

## 2017-01-22 NOTE — Assessment & Plan Note (Signed)
A1C today No microalbumin secondary to ACEI therapy Encouraged her to consume a low fat, low carb diet and exercise for weight loss Continue yearly eye exams Continue Metformin for now Foot exam today

## 2017-01-24 MED ORDER — GLIPIZIDE 5 MG PO TABS: 5.0000 mg | ORAL_TABLET | Freq: Two times a day (BID) | ORAL | 3 refills | Status: DC

## 2017-01-24 MED ORDER — LISINOPRIL-HYDROCHLOROTHIAZIDE 20-12.5 MG PO TABS: 1.0000 | ORAL_TABLET | Freq: Every day | ORAL | 3 refills | Status: DC

## 2017-01-24 MED ORDER — ROSUVASTATIN CALCIUM 10 MG PO TABS: 10.0000 mg | ORAL_TABLET | Freq: Every day | ORAL | 3 refills | Status: DC

## 2017-01-24 NOTE — Addendum Note (Signed)
Addended by: Lurlean Nanny on: 01/24/2017 05:23 PM   Modules accepted: Orders

## 2017-02-14 ENCOUNTER — Encounter: Payer: Self-pay | Admitting: Internal Medicine

## 2017-02-14 ENCOUNTER — Ambulatory Visit (INDEPENDENT_AMBULATORY_CARE_PROVIDER_SITE_OTHER): Payer: BLUE CROSS/BLUE SHIELD | Admitting: Internal Medicine

## 2017-02-14 DIAGNOSIS — E119 Type 2 diabetes mellitus without complications: Secondary | ICD-10-CM

## 2017-02-14 DIAGNOSIS — I10 Essential (primary) hypertension: Secondary | ICD-10-CM | POA: Diagnosis not present

## 2017-02-14 NOTE — Assessment & Plan Note (Signed)
Reports she could not tolerate Glipizide due to subjective hypoglycemia Will stop for now Continue Metformin Repeat A1C, Metformin and CMET in 2 months, lab only

## 2017-02-14 NOTE — Progress Notes (Signed)
Subjective:    Patient ID: Susan [name], female    DOB: 1966-04-23, 51 y.o.   MRN: [id number]  HPI  Pt presents to the clinic today for 3 week follow up of HTN. At her last visit, her Norvasc was stopped due to complaints of LE edema. Her Lisinopril was changed to Lisinopril HCT. She has been taking the medication as directed. She denies adverse side effects. She has noticed improvement in her edema. Her BP today is 122/78.   She also reports concerns with the Glipizide. She reports 20 minutes after taking it, she would feel jittery and lightheaded. She reports she never took her blood sugar when this occurred. She stopped taking it because of these symptoms. She continues to take Metformin.   Review of Systems      Past Medical History:  Diagnosis Date  . Contact lens/glasses fitting    wears contacts or glasses  . Diabetes mellitus without complication ([place])   . GERD (gastroesophageal reflux disease)    occ pepcid ac  . Hypertension   . Periumbilical hernia   . Snores     Current Outpatient Prescriptions  Medication Sig Dispense Refill  . Blood Glucose Monitoring Suppl (ONE TOUCH ULTRA 2) w/Device KIT     . cephALEXin (KEFLEX) 500 MG capsule Take 1 capsule (500 mg total) by mouth 3 (three) times daily. 21 capsule 0  . glipiZIDE (GLUCOTROL) 5 MG tablet Take 1 tablet (5 mg total) by mouth 2 (two) times daily before a meal. 60 tablet 3  . lisinopril-hydrochlorothiazide (ZESTORETIC) 20-12.5 MG tablet Take 1 tablet by mouth daily. 30 tablet 3  . metFORMIN (GLUCOPHAGE-XR) 500 MG 24 hr tablet Take by mouth.    . ONE TOUCH ULTRA TEST test strip     . ONETOUCH DELICA LANCETS 40J MISC     . rosuvastatin (CRESTOR) 10 MG tablet Take 1 tablet (10 mg total) by mouth daily. 30 tablet 3   No current facility-administered medications for this visit.     Allergies  Allergen Reactions  . Grapefruit Concentrate Other (See Comments)    blisters  . Sulfa Antibiotics Rash and Other (See  Comments)    Severe dehydration  . Mushroom Extract Complex   . Shellfish Allergy   . Strawberry Extract     Family History  Problem Relation Age of Onset  . Breast cancer Mother   . Schizophrenia Mother   . Depression Mother   . Renal Disease Father   . Lung cancer Maternal Grandmother   . Lung cancer Maternal Grandfather   . Heart disease Maternal Uncle     Social History   Social History  . Marital status: Married    Spouse name: N/A  . Number of children: N/A  . Years of education: N/A   Occupational History  . Not on file.   Social History Main Topics  . Smoking status: Never Smoker  . Smokeless tobacco: Never Used  . Alcohol use Yes     Comment: rare wine  . Drug use: No  . Sexual activity: Yes   Other Topics Concern  . Not on file   Social History Narrative  . No narrative on file     Constitutional: Denies fever, malaise, fatigue, headache or abrupt weight changes.  Respiratory: Denies difficulty breathing, shortness of breath, cough or sputum production.   Cardiovascular: Denies chest pain, chest tightness, palpitations or swelling in the hands or feet.   No other specific complaints in a complete  review of systems (except as listed in HPI above).  Objective:   Physical Exam  BP 122/78   Pulse 74   Temp 97.9 F (36.6 C) (Oral)   Wt 195 lb 8 oz (88.7 kg)   SpO2 97%   BMI 36.64 kg/m  Wt Readings from Last 3 Encounters:  02/14/17 195 lb 8 oz (88.7 kg)  01/22/17 200 lb (90.7 kg)  02/21/14 236 lb (107 kg)    General: Appears her stated age, obese in NAD. Cardiovascular: Normal rate and rhythm. S1,S2 noted.  No murmur, rubs or gallops noted. No BLE edema.  Pulmonary/Chest: Normal effort and positive vesicular breath sounds. No respiratory distress. No wheezes, rales or ronchi noted.  Neurological: Alert and oriented.    BMET    Component Value Date/Time   NA 132 (L) 01/22/2017 1121   K 4.1 01/22/2017 1121   CL 97 01/22/2017 1121   CO2  30 01/22/2017 1121   GLUCOSE 179 (H) 01/22/2017 1121   BUN 10 01/22/2017 1121   CREATININE 0.74 01/22/2017 1121   CALCIUM 10.0 01/22/2017 1121   GFRNONAA >90 02/21/2014 1510   GFRAA >90 02/21/2014 1510    Lipid Panel     Component Value Date/Time   CHOL 378 (H) 01/22/2017 1121   TRIG 137.0 01/22/2017 1121   HDL 141.80 01/22/2017 1121   CHOLHDL 3 01/22/2017 1121   VLDL 27.4 01/22/2017 1121   LDLCALC 209 (H) 01/22/2017 1121    CBC    Component Value Date/Time   WBC 10.4 01/22/2017 1121   RBC 4.26 01/22/2017 1121   HGB 12.7 01/22/2017 1121   HCT 38.6 01/22/2017 1121   PLT 327.0 01/22/2017 1121   MCV 90.7 01/22/2017 1121   MCH 30.2 02/21/2014 1442   MCHC 32.9 01/22/2017 1121   RDW 13.5 01/22/2017 1121   LYMPHSABS 3.4 02/21/2014 1442   MONOABS 0.7 02/21/2014 1442   EOSABS 0.4 02/21/2014 1442   BASOSABS 0.0 02/21/2014 1442    Hgb A1C Lab Results  Component Value Date   HGBA1C 7.7 (H) 01/22/2017            Assessment & Plan:

## 2017-02-14 NOTE — Patient Instructions (Signed)
Hypertension °Hypertension is another name for high blood pressure. High blood pressure forces your heart to work harder to pump blood. This can cause problems over time. °There are two numbers in a blood pressure reading. There is a top number (systolic) over a bottom number (diastolic). It is best to have a blood pressure below 120/80. Healthy choices can help lower your blood pressure. You may need medicine to help lower your blood pressure if: °· Your blood pressure cannot be lowered with healthy choices. °· Your blood pressure is higher than 130/80. °Follow these instructions at home: °Eating and drinking  °· If directed, follow the DASH eating plan. This diet includes: °¨ Filling half of your plate at each meal with fruits and vegetables. °¨ Filling one quarter of your plate at each meal with whole grains. Whole grains include whole wheat pasta, brown rice, and whole grain bread. °¨ Eating or drinking low-fat dairy products, such as skim milk or low-fat yogurt. °¨ Filling one quarter of your plate at each meal with low-fat (lean) proteins. Low-fat proteins include fish, skinless chicken, eggs, beans, and tofu. °¨ Avoiding fatty meat, cured and processed meat, or chicken with skin. °¨ Avoiding premade or processed food. °· Eat less than 1,500 mg of salt (sodium) a day. °· Limit alcohol use to no more than 1 drink a day for nonpregnant women and 2 drinks a day for men. One drink equals 12 oz of beer, 5 oz of wine, or 1½ oz of hard liquor. °Lifestyle  °· Work with your doctor to stay at a healthy weight or to lose weight. Ask your doctor what the best weight is for you. °· Get at least 30 minutes of exercise that causes your heart to beat faster (aerobic exercise) most days of the week. This may include walking, swimming, or biking. °· Get at least 30 minutes of exercise that strengthens your muscles (resistance exercise) at least 3 days a week. This may include lifting weights or pilates. °· Do not use any  products that contain nicotine or tobacco. This includes cigarettes and e-cigarettes. If you need help quitting, ask your doctor. °· Check your blood pressure at home as told by your doctor. °· Keep all follow-up visits as told by your doctor. This is important. °Medicines  °· Take over-the-counter and prescription medicines only as told by your doctor. Follow directions carefully. °· Do not skip doses of blood pressure medicine. The medicine does not work as well if you skip doses. Skipping doses also puts you at risk for problems. °· Ask your doctor about side effects or reactions to medicines that you should watch for. °Contact a doctor if: °· You think you are having a reaction to the medicine you are taking. °· You have headaches that keep coming back (recurring). °· You feel dizzy. °· You have swelling in your ankles. °· You have trouble with your vision. °Get help right away if: °· You get a very bad headache. °· You start to feel confused. °· You feel weak or numb. °· You feel faint. °· You get very bad pain in your: °¨ Chest. °¨ Belly (abdomen). °· You throw up (vomit) more than once. °· You have trouble breathing. °Summary °· Hypertension is another name for high blood pressure. °· Making healthy choices can help lower blood pressure. If your blood pressure cannot be controlled with healthy choices, you may need to take medicine. °This information is not intended to replace advice given to you by your   health care provider. Make sure you discuss any questions you have with your health care provider. °Document Released: 02/27/2008 Document Revised: 08/08/2016 Document Reviewed: 08/08/2016 °Elsevier Interactive Patient Education © 2017 Elsevier Inc. ° °

## 2017-02-14 NOTE — Assessment & Plan Note (Signed)
At goal Continue Lisinopril HCT Monitor

## 2017-04-25 ENCOUNTER — Other Ambulatory Visit (INDEPENDENT_AMBULATORY_CARE_PROVIDER_SITE_OTHER): Payer: BLUE CROSS/BLUE SHIELD

## 2017-04-25 ENCOUNTER — Other Ambulatory Visit: Payer: Self-pay | Admitting: Internal Medicine

## 2017-04-25 DIAGNOSIS — E119 Type 2 diabetes mellitus without complications: Secondary | ICD-10-CM

## 2017-04-25 LAB — LIPID PANEL
Cholesterol: 167 mg/dL (ref 0–200)
HDL: 84.5 mg/dL (ref >39.00)
LDL Cholesterol: 64 mg/dL (ref 0–99)
NonHDL: 82.06
Total CHOL/HDL Ratio: 2
Triglycerides: 92 mg/dL (ref 0.0–149.0)
VLDL: 18.4 mg/dL (ref 0.0–40.0)

## 2017-04-25 LAB — COMPREHENSIVE METABOLIC PANEL
ALT: 61 U/L — ABNORMAL HIGH (ref 0–35)
AST: 59 U/L — ABNORMAL HIGH (ref 0–37)
Albumin: 3.6 g/dL (ref 3.5–5.2)
Alkaline Phosphatase: 262 U/L — ABNORMAL HIGH (ref 39–117)
BUN: 13 mg/dL (ref 6–23)
CO2: 28 mEq/L (ref 19–32)
Calcium: 9.6 mg/dL (ref 8.4–10.5)
Chloride: 100 mEq/L (ref 96–112)
Creatinine, Ser: 0.87 mg/dL (ref 0.40–1.20)
GFR: 88.44 mL/min (ref >60.00)
Glucose, Bld: 165 mg/dL — ABNORMAL HIGH (ref 70–99)
Potassium: 4 mEq/L (ref 3.5–5.1)
Sodium: 134 mEq/L — ABNORMAL LOW (ref 135–145)
Total Bilirubin: 0.4 mg/dL (ref 0.2–1.2)
Total Protein: 7.9 g/dL (ref 6.0–8.3)

## 2017-04-25 LAB — HEMOGLOBIN A1C: Hgb A1c MFr Bld: 8.2 % — ABNORMAL HIGH (ref 4.6–6.5)

## 2017-04-25 MED ORDER — GLIPIZIDE 10 MG PO TABS: 10.0000 mg | ORAL_TABLET | Freq: Two times a day (BID) | ORAL | 3 refills | Status: DC

## 2017-04-25 NOTE — Addendum Note (Signed)
Addended by: Lurlean Nanny on: 04/25/2017 05:56 PM   Modules accepted: Orders

## 2017-04-29 ENCOUNTER — Ambulatory Visit (INDEPENDENT_AMBULATORY_CARE_PROVIDER_SITE_OTHER): Payer: Self-pay | Admitting: Internal Medicine

## 2017-05-02 NOTE — Progress Notes (Signed)
Erroneous encounter

## 2017-05-20 ENCOUNTER — Other Ambulatory Visit: Payer: Self-pay

## 2017-05-20 MED ORDER — METFORMIN HCL ER 500 MG PO TB24: 1000.0000 mg | ORAL_TABLET | Freq: Every day | ORAL | 2 refills | Status: DC

## 2017-05-20 NOTE — Telephone Encounter (Signed)
Pt request refill metformin 500 mg taking 2 tabs po at hs. Last seen 02/14/17; pt has f/u appt on 07/30/17. Refill done per protocol; pt voiced understanding.[facility].

## 2017-05-22 ENCOUNTER — Other Ambulatory Visit: Payer: Self-pay | Admitting: Internal Medicine

## 2017-07-17 ENCOUNTER — Other Ambulatory Visit: Payer: Self-pay | Admitting: Internal Medicine

## 2017-07-30 ENCOUNTER — Ambulatory Visit: Payer: BLUE CROSS/BLUE SHIELD | Admitting: Internal Medicine

## 2017-07-30 ENCOUNTER — Encounter: Payer: Self-pay | Admitting: Internal Medicine

## 2017-07-30 VITALS — BP 122/70 | HR 76 | Temp 98.0°F | Wt 203.0 lb

## 2017-07-30 DIAGNOSIS — E7800 Pure hypercholesterolemia, unspecified: Secondary | ICD-10-CM | POA: Diagnosis not present

## 2017-07-30 DIAGNOSIS — Z23 Encounter for immunization: Secondary | ICD-10-CM

## 2017-07-30 DIAGNOSIS — E119 Type 2 diabetes mellitus without complications: Secondary | ICD-10-CM | POA: Diagnosis not present

## 2017-07-30 DIAGNOSIS — I10 Essential (primary) hypertension: Secondary | ICD-10-CM | POA: Diagnosis not present

## 2017-07-30 DIAGNOSIS — R748 Abnormal levels of other serum enzymes: Secondary | ICD-10-CM

## 2017-07-30 LAB — HEMOGLOBIN A1C: Hgb A1c MFr Bld: 8 % — ABNORMAL HIGH (ref 4.6–6.5)

## 2017-07-30 LAB — LIPID PANEL
Cholesterol: 229 mg/dL — ABNORMAL HIGH (ref 0–200)
HDL: 111.1 mg/dL (ref >39.00)
LDL Cholesterol: 103 mg/dL — ABNORMAL HIGH (ref 0–99)
NonHDL: 117.69
Total CHOL/HDL Ratio: 2
Triglycerides: 74 mg/dL (ref 0.0–149.0)
VLDL: 14.8 mg/dL (ref 0.0–40.0)

## 2017-07-30 LAB — COMPREHENSIVE METABOLIC PANEL
ALT: 88 U/L — ABNORMAL HIGH (ref 0–35)
AST: 70 U/L — ABNORMAL HIGH (ref 0–37)
Albumin: 3.8 g/dL (ref 3.5–5.2)
Alkaline Phosphatase: 239 U/L — ABNORMAL HIGH (ref 39–117)
BUN: 12 mg/dL (ref 6–23)
CO2: 28 mEq/L (ref 19–32)
Calcium: 10.1 mg/dL (ref 8.4–10.5)
Chloride: 99 mEq/L (ref 96–112)
Creatinine, Ser: 0.85 mg/dL (ref 0.40–1.20)
GFR: 90.75 mL/min (ref >60.00)
Glucose, Bld: 163 mg/dL — ABNORMAL HIGH (ref 70–99)
Potassium: 4.2 mEq/L (ref 3.5–5.1)
Sodium: 134 mEq/L — ABNORMAL LOW (ref 135–145)
Total Bilirubin: 0.6 mg/dL (ref 0.2–1.2)
Total Protein: 8.6 g/dL — ABNORMAL HIGH (ref 6.0–8.3)

## 2017-07-30 NOTE — Progress Notes (Signed)
Subjective:    Patient ID: Susan [name], female    DOB: 1966/03/30, 51 y.o.   MRN: [id number]  HPI  Pt presents to the clinic today for follow up of chronic conditions.  HTN: Her BP today is 122/70. She is taking Lisinopril HCT as prescribed. ECG from 07/2013 reviewed.  DM 2: Her last A1C was 8.2%, 04/2017. She is taking Glipizide and Metformin as prescribed. She does not check her sugars. Her last eye exam 08/2016, [facility]. She needs a flu shot today. Her pneumovax was 10/2016.  HLD: Her last LDL was 64, 04/2017. She denies myalgias on Crestor. She tries to consume a low fat diet but could be better at it.   Review of Systems      Past Medical History:  Diagnosis Date  . Contact lens/glasses fitting    wears contacts or glasses  . Diabetes mellitus without complication ([place])   . GERD (gastroesophageal reflux disease)    occ pepcid ac  . Hypertension   . Periumbilical hernia   . Snores     Current Outpatient Medications  Medication Sig Dispense Refill  . Blood Glucose Monitoring Suppl (ONE TOUCH ULTRA 2) w/Device KIT     . glipiZIDE (GLUCOTROL) 10 MG tablet Take 1 tablet (10 mg total) by mouth 2 (two) times daily before a meal. 60 tablet 3  . lisinopril-hydrochlorothiazide (PRINZIDE,ZESTORETIC) 20-12.5 MG tablet TAKE 1 TABLET BY MOUTH EVERY DAY 30 tablet 1  . metFORMIN (GLUCOPHAGE-XR) 500 MG 24 hr tablet Take 2 tablets (1,000 mg total) by mouth at bedtime. 60 tablet 2  . ONE TOUCH ULTRA TEST test strip     . ONETOUCH DELICA LANCETS 35T MISC     . rosuvastatin (CRESTOR) 10 MG tablet TAKE 1 TABLET BY MOUTH EVERY DAY 30 tablet 1   No current facility-administered medications for this visit.     Allergies  Allergen Reactions  . Grapefruit Concentrate Other (See Comments)    blisters  . Sulfa Antibiotics Rash and Other (See Comments)    Severe dehydration  . Mushroom Extract Complex   . Shellfish Allergy   . Strawberry Extract     Family History  Problem  Relation Age of Onset  . Breast cancer Mother   . Schizophrenia Mother   . Depression Mother   . Renal Disease Father   . Lung cancer Maternal Grandmother   . Lung cancer Maternal Grandfather   . Heart disease Maternal Uncle     Social History   Socioeconomic History  . Marital status: Married    Spouse name: Not on file  . Number of children: Not on file  . Years of education: Not on file  . Highest education level: Not on file  Social Needs  . Financial resource strain: Not on file  . Food insecurity - worry: Not on file  . Food insecurity - inability: Not on file  . Transportation needs - medical: Not on file  . Transportation needs - non-medical: Not on file  Occupational History  . Not on file  Tobacco Use  . Smoking status: Never Smoker  . Smokeless tobacco: Never Used  Substance and Sexual Activity  . Alcohol use: Yes    Comment: rare wine  . Drug use: No  . Sexual activity: Yes  Other Topics Concern  . Not on file  Social History Narrative  . Not on file     Constitutional: Denies fever, malaise, fatigue, headache or abrupt weight changes.  Respiratory: Denies difficulty breathing, shortness of breath, cough or sputum production.   Cardiovascular: Denies chest pain, chest tightness, palpitations or swelling in the hands or feet.  Skin: Denies redness, rashes, lesions or ulcercations.  Neurological: Denies dizziness, difficulty with memory, difficulty with speech or problems with balance and coordination.    No other specific complaints in a complete review of systems (except as listed in HPI above).  Objective:   Physical Exam   BP 122/70 (BP Location: Left Arm, Patient Position: Sitting, Cuff Size: Normal)   Pulse 76   Temp 98 F (36.7 C) (Oral)   Wt 203 lb (92.1 kg)   SpO2 97%   BMI 38.04 kg/m  Wt Readings from Last 3 Encounters:  07/30/17 203 lb (92.1 kg)  02/14/17 195 lb 8 oz (88.7 kg)  01/22/17 200 lb (90.7 kg)    General: Appears her  stated age, obese in NAD. Skin: Warm, dry and intact. No ulcerations noted. Cardiovascular: Normal rate and rhythm. S1,S2 noted.  No murmur, rubs or gallops noted.  Pulmonary/Chest: Normal effort and positive vesicular breath sounds. No respiratory distress. No wheezes, rales or ronchi noted.  Neurological: Alert and oriented. Sensation intact to BLE.   BMET    Component Value Date/Time   NA 134 (L) 07/30/2017 0827   K 4.2 07/30/2017 0827   CL 99 07/30/2017 0827   CO2 28 07/30/2017 0827   GLUCOSE 163 (H) 07/30/2017 0827   BUN 12 07/30/2017 0827   CREATININE 0.85 07/30/2017 0827   CALCIUM 10.1 07/30/2017 0827   GFRNONAA >90 02/21/2014 1510   GFRAA >90 02/21/2014 1510    Lipid Panel     Component Value Date/Time   CHOL 229 (H) 07/30/2017 0827   TRIG 74.0 07/30/2017 0827   HDL 111.10 07/30/2017 0827   CHOLHDL 2 07/30/2017 0827   VLDL 14.8 07/30/2017 0827   LDLCALC 103 (H) 07/30/2017 0827    CBC    Component Value Date/Time   WBC 10.4 01/22/2017 1121   RBC 4.26 01/22/2017 1121   HGB 12.7 01/22/2017 1121   HCT 38.6 01/22/2017 1121   PLT 327.0 01/22/2017 1121   MCV 90.7 01/22/2017 1121   MCH 30.2 02/21/2014 1442   MCHC 32.9 01/22/2017 1121   RDW 13.5 01/22/2017 1121   LYMPHSABS 3.4 02/21/2014 1442   MONOABS 0.7 02/21/2014 1442   EOSABS 0.4 02/21/2014 1442   BASOSABS 0.0 02/21/2014 1442    Hgb A1C Lab Results  Component Value Date   HGBA1C 8.0 (H) 07/30/2017        Assessment & Plan:

## 2017-07-31 NOTE — Patient Instructions (Signed)
Fat and Cholesterol Restricted Diet Getting too much fat and cholesterol in your diet may cause health problems. Following this diet helps keep your fat and cholesterol at normal levels. This can keep you from getting sick. What types of fat should I choose?  Choose monosaturated and polyunsaturated fats. These are found in foods such as olive oil, canola oil, flaxseeds, walnuts, almonds, and seeds.  Eat more omega-3 fats. Good choices include salmon, mackerel, sardines, tuna, flaxseed oil, and ground flaxseeds.  Limit saturated fats. These are in animal products such as meats, butter, and cream. They can also be in plant products such as palm oil, palm kernel oil, and coconut oil.  Avoid foods with partially hydrogenated oils in them. These contain trans fats. Examples of foods that have trans fats are stick margarine, some tub margarines, cookies, crackers, and other baked goods. What general guidelines do I need to follow?  Check food labels. Look for the words "trans fat" and "saturated fat."  When preparing a meal: ? Fill half of your plate with vegetables and green salads. ? Fill one fourth of your plate with whole grains. Look for the word "whole" as the first word in the ingredient list. ? Fill one fourth of your plate with lean protein foods.  Eat more foods that have fiber, like apples, carrots, beans, peas, and barley.  Eat more home-cooked foods. Eat less at restaurants and buffets.  Limit or avoid alcohol.  Limit foods high in starch and sugar.  Limit fried foods.  Cook foods without frying them. Baking, boiling, grilling, and broiling are all great options.  Lose weight if you are overweight. Losing even a small amount of weight can help your overall health. It can also help prevent diseases such as diabetes and heart disease. What foods can I eat? Grains Whole grains, such as whole wheat or whole grain breads, crackers, cereals, and pasta. Unsweetened oatmeal,  bulgur, barley, quinoa, or brown rice. Corn or whole wheat flour tortillas. Vegetables Fresh or frozen vegetables (raw, steamed, roasted, or grilled). Green salads. Fruits All fresh, canned (in natural juice), or frozen fruits. Meat and Other Protein Products Ground beef (85% or leaner), grass-fed beef, or beef trimmed of fat. Skinless chicken or turkey. Ground chicken or turkey. Pork trimmed of fat. All fish and seafood. Eggs. Dried beans, peas, or lentils. Unsalted nuts or seeds. Unsalted canned or dry beans. Dairy Low-fat dairy products, such as skim or 1% milk, 2% or reduced-fat cheeses, low-fat ricotta or cottage cheese, or plain low-fat yogurt. Fats and Oils Tub margarines without trans fats. Light or reduced-fat mayonnaise and salad dressings. Avocado. Olive, canola, sesame, or safflower oils. Natural peanut or almond butter (choose ones without added sugar and oil). The items listed above may not be a complete list of recommended foods or beverages. Contact your dietitian for more options. What foods are not recommended? Grains White bread. White pasta. White rice. Cornbread. Bagels, pastries, and croissants. Crackers that contain trans fat. Vegetables White potatoes. Corn. Creamed or fried vegetables. Vegetables in a cheese sauce. Fruits Dried fruits. Canned fruit in light or heavy syrup. Fruit juice. Meat and Other Protein Products Fatty cuts of meat. Ribs, chicken wings, bacon, sausage, bologna, salami, chitterlings, fatback, hot dogs, bratwurst, and packaged luncheon meats. Liver and organ meats. Dairy Whole or 2% milk, cream, half-and-half, and cream cheese. Whole milk cheeses. Whole-fat or sweetened yogurt. Full-fat cheeses. Nondairy creamers and whipped toppings. Processed cheese, cheese spreads, or cheese curds. Sweets and Desserts Corn   syrup, sugars, honey, and molasses. Candy. Jam and jelly. Syrup. Sweetened cereals. Cookies, pies, cakes, donuts, muffins, and ice  cream. Fats and Oils Butter, stick margarine, lard, shortening, ghee, or bacon fat. Coconut, palm kernel, or palm oils. Beverages Alcohol. Sweetened drinks (such as sodas, lemonade, and fruit drinks or punches). The items listed above may not be a complete list of foods and beverages to avoid. Contact your dietitian for more information. This information is not intended to replace advice given to you by your health care provider. Make sure you discuss any questions you have with your health care provider. Document Released: 03/11/2012 Document Revised: 05/17/2016 Document Reviewed: 12/10/2013 Elsevier Interactive Patient Education  2018 Elsevier Inc.  

## 2017-08-07 MED ORDER — METFORMIN HCL ER (MOD) 1000 MG PO TB24: 1000.0000 mg | ORAL_TABLET | Freq: Two times a day (BID) | ORAL | 3 refills | Status: DC

## 2017-08-07 NOTE — Addendum Note (Signed)
Addended by: Lurlean Nanny on: 08/07/2017 12:23 PM   Modules accepted: Orders

## 2017-08-07 NOTE — Addendum Note (Signed)
Addended by: Lurlean Nanny on: 08/07/2017 12:20 PM   Modules accepted: Orders

## 2017-08-08 ENCOUNTER — Other Ambulatory Visit (INDEPENDENT_AMBULATORY_CARE_PROVIDER_SITE_OTHER): Payer: BLUE CROSS/BLUE SHIELD

## 2017-08-08 DIAGNOSIS — R748 Abnormal levels of other serum enzymes: Secondary | ICD-10-CM | POA: Diagnosis not present

## 2017-08-09 LAB — HEPATITIS PANEL, ACUTE
Hep A IgM: NONREACTIVE
Hep B C IgM: NONREACTIVE
Hepatitis B Surface Ag: NONREACTIVE
Hepatitis C Ab: NONREACTIVE
SIGNAL TO CUT-OFF: 0.06 (ref <1.00)

## 2017-08-12 ENCOUNTER — Other Ambulatory Visit: Payer: Self-pay | Admitting: Internal Medicine

## 2017-08-13 ENCOUNTER — Other Ambulatory Visit: Payer: Self-pay | Admitting: Internal Medicine

## 2017-08-13 ENCOUNTER — Ambulatory Visit
Admission: RE | Admit: 2017-08-13 | Discharge: 2017-08-13 | Disposition: A | Discharge status: Home / Self Care | Payer: BLUE CROSS/BLUE SHIELD | Source: Ambulatory Visit | Attending: Internal Medicine | Admitting: Internal Medicine

## 2017-08-13 DIAGNOSIS — R748 Abnormal levels of other serum enzymes: Secondary | ICD-10-CM

## 2017-08-13 DIAGNOSIS — R7989 Other specified abnormal findings of blood chemistry: Secondary | ICD-10-CM

## 2017-08-13 DIAGNOSIS — R945 Abnormal results of liver function studies: Principal | ICD-10-CM

## 2017-08-13 DIAGNOSIS — K7460 Unspecified cirrhosis of liver: Secondary | ICD-10-CM

## 2017-08-18 ENCOUNTER — Other Ambulatory Visit: Payer: Self-pay | Admitting: Internal Medicine

## 2017-08-19 ENCOUNTER — Encounter: Payer: Self-pay | Admitting: Gastroenterology

## 2017-08-19 ENCOUNTER — Other Ambulatory Visit: Payer: Self-pay | Admitting: Internal Medicine

## 2017-08-19 MED ORDER — GLIPIZIDE ER 10 MG PO TB24: 10.0000 mg | ORAL_TABLET | Freq: Every day | ORAL | 2 refills | Status: DC

## 2017-09-17 ENCOUNTER — Other Ambulatory Visit: Payer: Self-pay | Admitting: Internal Medicine

## 2017-09-24 LAB — HM DIABETES EYE EXAM

## 2017-10-07 ENCOUNTER — Other Ambulatory Visit (INDEPENDENT_AMBULATORY_CARE_PROVIDER_SITE_OTHER): Payer: BLUE CROSS/BLUE SHIELD

## 2017-10-07 ENCOUNTER — Ambulatory Visit: Payer: BLUE CROSS/BLUE SHIELD | Admitting: Gastroenterology

## 2017-10-07 ENCOUNTER — Encounter: Payer: Self-pay | Admitting: Gastroenterology

## 2017-10-07 VITALS — BP 138/84 | HR 76 | Ht 59.75 in | Wt 201.1 lb

## 2017-10-07 DIAGNOSIS — Z1211 Encounter for screening for malignant neoplasm of colon: Secondary | ICD-10-CM | POA: Diagnosis not present

## 2017-10-07 DIAGNOSIS — R748 Abnormal levels of other serum enzymes: Secondary | ICD-10-CM

## 2017-10-07 DIAGNOSIS — R932 Abnormal findings on diagnostic imaging of liver and biliary tract: Secondary | ICD-10-CM

## 2017-10-07 LAB — CBC WITH DIFFERENTIAL/PLATELET
Basophils Absolute: 0.1 10*3/uL (ref 0.0–0.1)
Basophils Relative: 1 % (ref 0.0–3.0)
Eosinophils Absolute: 0.3 10*3/uL (ref 0.0–0.7)
Eosinophils Relative: 3.5 % (ref 0.0–5.0)
HCT: 42.5 % (ref 36.0–46.0)
Hemoglobin: 14 g/dL (ref 12.0–15.0)
Lymphocytes Relative: 38.4 % (ref 12.0–46.0)
Lymphs Abs: 3 10*3/uL (ref 0.7–4.0)
MCHC: 32.9 g/dL (ref 30.0–36.0)
MCV: 90.6 fl (ref 78.0–100.0)
Monocytes Absolute: 0.5 10*3/uL (ref 0.1–1.0)
Monocytes Relative: 6 % (ref 3.0–12.0)
Neutro Abs: 3.9 10*3/uL (ref 1.4–7.7)
Neutrophils Relative %: 51.1 % (ref 43.0–77.0)
Platelets: 224 10*3/uL (ref 150.0–400.0)
RBC: 4.69 Mil/uL (ref 3.87–5.11)
RDW: 12.9 % (ref 11.5–15.5)
WBC: 7.7 10*3/uL (ref 4.0–10.5)

## 2017-10-07 LAB — PROTIME-INR
INR: 1.1 ratio — ABNORMAL HIGH (ref 0.8–1.0)
Prothrombin Time: 12.4 s (ref 9.6–13.1)

## 2017-10-07 LAB — IBC PANEL
Iron: 100 ug/dL (ref 42–145)
Saturation Ratios: 22 % (ref 20.0–50.0)
Transferrin: 325 mg/dL (ref 212.0–360.0)

## 2017-10-07 LAB — HEPATIC FUNCTION PANEL
ALT: 98 U/L — ABNORMAL HIGH (ref 0–35)
AST: 61 U/L — ABNORMAL HIGH (ref 0–37)
Albumin: 3.7 g/dL (ref 3.5–5.2)
Alkaline Phosphatase: 325 U/L — ABNORMAL HIGH (ref 39–117)
Bilirubin, Direct: 0.2 mg/dL (ref 0.0–0.3)
Total Bilirubin: 0.7 mg/dL (ref 0.2–1.2)
Total Protein: 8.5 g/dL — ABNORMAL HIGH (ref 6.0–8.3)

## 2017-10-07 LAB — FERRITIN: Ferritin: 69.1 ng/mL (ref 10.0–291.0)

## 2017-10-07 NOTE — Progress Notes (Signed)
HPI :  52 y/o female with a history of DM, GERD, HTN, referred here by [name] NP for evaluation for elevated LFTs / abnormal [place].   She reports she is aware of elevated liver enzymes for the past few months.  Review of chart shows elevations in liver enzymes dating back to February 2018. She reports no known history of liver disease. She denies any family history of liver disease. She denies any history of jaundice. She was diagnosed with diabetes about a year ago, currently on glipizide. She denies any alcohol routinely, she has roughly 2 alcoholic beverages per year. She denies any tobacco use. She denies any significant weight change over the past year. 2 years ago she dieted aggressively and lost about 40 pounds over a year intentionally. Her weight has been stable over the past year or so. Crestor is a new medication for her, has been taking this for the past 3 months. She also endorses taking fenugreek for the past 3 weeks. She denies any other herbal supplements.   She's had lab work with negative testing for hepatitis B and C. AST ALT and alkaline phosphatase elevated as outlined with a normal bilirubin. Ultrasound of the liver done November suggests underlying cirrhosis. No fatty liver noted.  Otherwise she's never had a screening colonoscopy. She endorses some baseline constipation which she is managing with high-fiber diet at this time and control her symptoms well. She has roughly 2 bowel movements per day. She has a history of hemorrhoids related to her pregnancy. She denies any routine rectal bleeding.  [place] 08/13/2017 - liver with nodular contour suggestive of cirrhosis, normal gallbladder  ALT 88, AP 239, AST 70 Negative hep B and C testing A1c 8.0    Past Medical History:  Diagnosis Date  . Contact lens/glasses fitting    wears contacts or glasses  . Diabetes mellitus without complication ([place])   . GERD (gastroesophageal reflux disease)    occ pepcid ac  . Hypertension    . Periumbilical hernia   . Snores      Past Surgical History:  Procedure Laterality Date  . DILATION AND CURETTAGE OF UTERUS  11/1995, 10/1997  . TUBAL LIGATION  10/1997  . UMBILICAL HERNIA REPAIR N/A 08/11/2013   Procedure: HERNIA REPAIR UMBILICAL ADULT;  Surgeon: Gwenyth Ober, MD;  Location: [facility];  Service: General;  Laterality: N/A;   Family History  Problem Relation Age of Onset  . Breast cancer Mother   . Schizophrenia Mother   . Depression Mother   . Renal Disease Father   . Diabetes Father   . Other Father        Agent Orange  . Lung cancer Maternal Grandmother   . Lung cancer Maternal Grandfather   . Heart disease Maternal Uncle   . Diabetes Other        Aunts and Uncles on both sides  . Heart disease Maternal Aunt    Social History   Tobacco Use  . Smoking status: Never Smoker  . Smokeless tobacco: Never Used  Substance Use Topics  . Alcohol use: Yes    Comment: wine 2 times a year  . Drug use: No   Current Outpatient Medications  Medication Sig Dispense Refill  . Blood Glucose Monitoring Suppl (ONE TOUCH ULTRA 2) w/Device KIT     . glipiZIDE (GLUCOTROL XL) 10 MG 24 hr tablet Take 1 tablet (10 mg total) by mouth daily with breakfast. 30 tablet 2  .  lisinopril-hydrochlorothiazide (PRINZIDE,ZESTORETIC) 20-12.5 MG tablet TAKE 1 TABLET BY MOUTH EVERY DAY 30 tablet 1  . ONE TOUCH ULTRA TEST test strip     . ONETOUCH DELICA LANCETS 36R MISC     . rosuvastatin (CRESTOR) 10 MG tablet TAKE 1 TABLET BY MOUTH EVERY DAY 30 tablet 1   No current facility-administered medications for this visit.    Allergies  Allergen Reactions  . Grapefruit Concentrate Other (See Comments)    blisters  . Sulfa Antibiotics Rash and Other (See Comments)    Severe dehydration  . Mushroom Extract Complex   . Shellfish Allergy   . Strawberry Extract      Review of Systems: All systems reviewed and negative except where noted in HPI.    Labs reviewed in  Epic  Physical Exam: BP 138/84 (BP Location: Left Arm, Patient Position: Sitting, Cuff Size: Normal)   Pulse 76   Ht 4' 11.75" (1.518 m) Comment: height measured without shoes  Wt 201 lb 2 oz (91.2 kg)   LMP 08/31/2017   BMI 39.61 kg/m  Constitutional: Pleasant,well-developed, female in no acute distress. HEENT: Normocephalic and atraumatic. Conjunctivae are normal. No scleral icterus. Neck supple.  Cardiovascular: Normal rate, regular rhythm.  Pulmonary/chest: Effort normal and breath sounds normal. No wheezing, rales or rhonchi. Abdominal: Soft, potuberant, nontender.  There are no masses palpable. No hepatomegaly. Extremities: no edema Lymphadenopathy: No cervical adenopathy noted. Neurological: Alert and oriented to person place and time. Skin: Skin is warm and dry. No rashes noted. Psychiatric: Normal mood and affect. Behavior is normal.   ASSESSMENT AND PLAN: 52 year old female history diabetes, referred here for evaluation of liver enzymes and abnormal US of the liver.   I reviewed her liver enzymes with her and ultrasound of the liver. The changes suggestive of cirrhosis or concerning, however her platelets and INR relatively normal, not convinced she has cirrhosis at this time, but if she does it appears well compensated. I discussed what cirrhosis is at length and potential for decompensations moving forward if she does have it. I discussed the differential for her liver enzyme elevation with her, which appears to be a mixed pattern. Thus far hepatitis B/C negative, no evidence of steatosis on [place]. Recommending a full serologic workup for underlying chronic liver diseases at this time. Pending this result, may also consider cross sectional imaging of the liver and to assess spleen size, and/or liver biopsy pending her course. Last LFTs done in November, we'll repeat them today to see where they are trending. She started Crestor the past 3 months, her enzymes have risen recently,  we'll stop this at present time and she should stop the fenugreek as well (even though this has not been listed to cause abnormalities in liver enzymes). Recommend she avoid all other herbal supplements at this time.   She will be contacted with lab results with further recommendations.   Otherwise, due for routine colon cancer screening. She is agreeable to optical colonoscopy but wishes to have the liver issue addressed first prior to scheduling. Will follow up with her to schedule this in the upcoming few months.   Weston Cellar, MD [facility] Pager [phone]  CC: Jearld Fenton, NP

## 2017-10-07 NOTE — Patient Instructions (Addendum)
If you are age 51 or older, your body mass index should be between 23-30. Your Body mass index is 39.61 kg/m. If this is out of the aforementioned range listed, please consider follow up with your Primary Care Provider.  If you are age 20 or younger, your body mass index should be between 19-25. Your Body mass index is 39.61 kg/m. If this is out of the aformentioned range listed, please consider follow up with your Primary Care Provider.   It has been recommended to you by your physician that you have a(n) colonoscopy completed. Per your request, we did not schedule the procedure(s) today. Please contact our office at [phone] should you decide to have the procedure completed.  Please go to the lab in the basement of our building to have lab work done as you leave today.  Please hold your Crestor and Fenugreek supplement.   Thank you for entrusting me with your care and for [facility], Dr. [place] Cellar

## 2017-10-09 LAB — HEPATITIS A ANTIBODY, TOTAL: Hepatitis A AB,Total: NONREACTIVE

## 2017-10-09 LAB — ANA: Anti Nuclear Antibody(ANA): POSITIVE — AB

## 2017-10-09 LAB — ANTI-NUCLEAR AB-TITER (ANA TITER): ANA Titer 1: 1:160 {titer} — ABNORMAL HIGH

## 2017-10-09 LAB — IGG: IgG (Immunoglobin G), Serum: 2094 mg/dL — ABNORMAL HIGH (ref 694–1618)

## 2017-10-09 LAB — MITOCHONDRIAL ANTIBODIES: Mitochondrial M2 Ab, IgG: <=20 U

## 2017-10-09 LAB — AFP TUMOR MARKER: AFP-Tumor Marker: 8.5 ng/mL — ABNORMAL HIGH

## 2017-10-09 LAB — ALPHA-1-ANTITRYPSIN: A-1 Antitrypsin, Ser: 148 mg/dL (ref 83–199)

## 2017-10-09 LAB — ANTI-SMOOTH MUSCLE ANTIBODY, IGG: Actin (Smooth Muscle) Antibody (IGG): 35 U — ABNORMAL HIGH (ref <20)

## 2017-10-09 LAB — CERULOPLASMIN: Ceruloplasmin: 54 mg/dL — ABNORMAL HIGH (ref 18–53)

## 2017-10-09 LAB — HEPATITIS B SURFACE ANTIBODY,QUALITATIVE: Hep B S Ab: NONREACTIVE

## 2017-10-10 ENCOUNTER — Other Ambulatory Visit: Payer: Self-pay

## 2017-10-10 DIAGNOSIS — R899 Unspecified abnormal finding in specimens from other organs, systems and tissues: Secondary | ICD-10-CM

## 2017-10-15 ENCOUNTER — Ambulatory Visit (INDEPENDENT_AMBULATORY_CARE_PROVIDER_SITE_OTHER): Payer: BLUE CROSS/BLUE SHIELD | Admitting: Gastroenterology

## 2017-10-15 DIAGNOSIS — Z23 Encounter for immunization: Secondary | ICD-10-CM

## 2017-10-21 ENCOUNTER — Other Ambulatory Visit: Payer: Self-pay | Admitting: General Surgery

## 2017-10-21 ENCOUNTER — Other Ambulatory Visit: Payer: Self-pay | Admitting: Radiology

## 2017-10-22 ENCOUNTER — Encounter (HOSPITAL_COMMUNITY): Payer: Self-pay

## 2017-10-22 ENCOUNTER — Ambulatory Visit (HOSPITAL_COMMUNITY)
Admission: RE | Admit: 2017-10-22 | Discharge: 2017-10-22 | Disposition: A | Discharge status: Home / Self Care | Payer: BLUE CROSS/BLUE SHIELD | Source: Ambulatory Visit | Attending: Gastroenterology | Admitting: Gastroenterology

## 2017-10-22 DIAGNOSIS — K219 Gastro-esophageal reflux disease without esophagitis: Secondary | ICD-10-CM | POA: Insufficient documentation

## 2017-10-22 DIAGNOSIS — R899 Unspecified abnormal finding in specimens from other organs, systems and tissues: Secondary | ICD-10-CM

## 2017-10-22 DIAGNOSIS — Z7984 Long term (current) use of oral hypoglycemic drugs: Secondary | ICD-10-CM | POA: Insufficient documentation

## 2017-10-22 DIAGNOSIS — E119 Type 2 diabetes mellitus without complications: Secondary | ICD-10-CM | POA: Diagnosis not present

## 2017-10-22 DIAGNOSIS — K740 Hepatic fibrosis: Secondary | ICD-10-CM | POA: Diagnosis not present

## 2017-10-22 DIAGNOSIS — R945 Abnormal results of liver function studies: Secondary | ICD-10-CM | POA: Insufficient documentation

## 2017-10-22 DIAGNOSIS — R7989 Other specified abnormal findings of blood chemistry: Secondary | ICD-10-CM | POA: Diagnosis not present

## 2017-10-22 DIAGNOSIS — Z79899 Other long term (current) drug therapy: Secondary | ICD-10-CM | POA: Insufficient documentation

## 2017-10-22 DIAGNOSIS — I10 Essential (primary) hypertension: Secondary | ICD-10-CM | POA: Insufficient documentation

## 2017-10-22 LAB — CBC
HCT: 41.1 % (ref 36.0–46.0)
Hemoglobin: 14.2 g/dL (ref 12.0–15.0)
MCH: 30.3 pg (ref 26.0–34.0)
MCHC: 34.5 g/dL (ref 30.0–36.0)
MCV: 87.8 fL (ref 78.0–100.0)
Platelets: 249 10*3/uL (ref 150–400)
RBC: 4.68 MIL/uL (ref 3.87–5.11)
RDW: 12.3 % (ref 11.5–15.5)
WBC: 9.2 10*3/uL (ref 4.0–10.5)

## 2017-10-22 LAB — GLUCOSE, CAPILLARY: Glucose-Capillary: 260 mg/dL — ABNORMAL HIGH (ref 65–99)

## 2017-10-22 LAB — PROTIME-INR
INR: 0.97
Prothrombin Time: 12.8 s (ref 11.4–15.2)

## 2017-10-22 MED ORDER — FLUMAZENIL 0.5 MG/5ML IV SOLN
INTRAVENOUS | Status: AC
  Filled 2017-10-22: qty 5

## 2017-10-22 MED ORDER — FENTANYL CITRATE (PF) 100 MCG/2ML IJ SOLN
INTRAMUSCULAR | Status: AC | PRN
  Administered 2017-10-22 (×4): 50 ug via INTRAVENOUS

## 2017-10-22 MED ORDER — NALOXONE HCL 0.4 MG/ML IJ SOLN
INTRAMUSCULAR | Status: AC
  Filled 2017-10-22: qty 1

## 2017-10-22 MED ORDER — HYDROCODONE-ACETAMINOPHEN 5-325 MG PO TABS
1.0000 | ORAL_TABLET | ORAL | Status: DC | PRN
  Administered 2017-10-22: 1 via ORAL
  Filled 2017-10-22: qty 1

## 2017-10-22 MED ORDER — MIDAZOLAM HCL 2 MG/2ML IJ SOLN
INTRAMUSCULAR | Status: AC | PRN
  Administered 2017-10-22 (×4): 1 mg via INTRAVENOUS

## 2017-10-22 MED ORDER — LIDOCAINE HCL 1 % IJ SOLN
INTRAMUSCULAR | Status: AC
  Filled 2017-10-22: qty 20

## 2017-10-22 MED ORDER — SODIUM CHLORIDE 0.9 % IV SOLN
INTRAVENOUS | Status: DC
  Administered 2017-10-22: 12:00:00 via INTRAVENOUS

## 2017-10-22 MED ORDER — MIDAZOLAM HCL 2 MG/2ML IJ SOLN
INTRAMUSCULAR | Status: AC
  Filled 2017-10-22: qty 4

## 2017-10-22 MED ORDER — FENTANYL CITRATE (PF) 100 MCG/2ML IJ SOLN
INTRAMUSCULAR | Status: AC
  Filled 2017-10-22: qty 4

## 2017-10-22 NOTE — Sedation Documentation (Signed)
Patient denies pain and is resting comfortably.  

## 2017-10-22 NOTE — Discharge Instructions (Signed)
Liver Biopsy, Care After These instructions give you information on caring for yourself after your procedure. Your doctor may also give you more specific instructions. Call your doctor if you have any problems or questions after your procedure. Follow these instructions at home:  Rest at home for 1-2 days or as told by your doctor.  Have someone stay with you for at least 24 hours.  Do not do these things in the first 24 hours: ? Drive. ? Use machinery. ? Take care of other people. ? Sign legal documents. ? Take a bath or shower.  You may shower tomorrow.  There are many different ways to close and cover a cut (incision). For example, a cut can be closed with stitches, skin glue, or adhesive strips. Follow your doctor's instructions on: ? Taking care of your cut. ? Changing and removing your bandage (dressing). ? Removing whatever was used to close your cut.  Do not drink alcohol in the first week.  Do not lift more than 5 pounds or play contact sports for the first 2 weeks. Contact a doctor if:  A cut bleeds and leaves more than just a small spot of blood.  A cut is red, puffs up (swells), or hurts more than before.  Fluid or something else comes from a cut.  A cut smells bad.  You have a fever or chills. Get help right away if:  You have swelling, bloating, or pain in your belly (abdomen).  You get dizzy or faint.  You have a rash.  You feel sick to your stomach (nauseous) or throw up (vomit).  You have trouble breathing, feel short of breath, or feel faint.  Your chest hurts.  You have problems talking or seeing.  You have trouble balancing or moving your arms or legs. This information is not intended to replace advice given to you by your health care provider. Make sure you discuss any questions you have with your health care provider. Document Released: 06/19/2008 Document Revised: 02/16/2016 Document Reviewed: 11/06/2013 Elsevier Interactive Patient  Education  2018 [place].   Moderate Conscious Sedation, Adult, Care After These instructions provide you with information about caring for yourself after your procedure. Your health care provider may also give you more specific instructions. Your treatment has been planned according to current medical practices, but problems sometimes occur. Call your health care provider if you have any problems or questions after your procedure. What can I expect after the procedure? After your procedure, it is common:  To feel sleepy for several hours.  To feel clumsy and have poor balance for several hours.  To have poor judgment for several hours.  To vomit if you eat too soon.  Follow these instructions at home: For at least 24 hours after the procedure:   Do not: ? Participate in activities where you could fall or become injured. ? Drive. ? Use heavy machinery. ? Drink alcohol. ? Take sleeping pills or medicines that cause drowsiness. ? Make important decisions or sign legal documents. ? Take care of children on your own.  Rest. Eating and drinking  Follow the diet recommended by your health care provider.  If you vomit: ? Drink water, juice, or soup when you can drink without vomiting. ? Make sure you have little or no nausea before eating solid foods. General instructions  Have a responsible adult stay with you until you are awake and alert.  Take over-the-counter and prescription medicines only as told by your health care  provider.  If you smoke, do not smoke without supervision.  Keep all follow-up visits as told by your health care provider. This is important. Contact a health care provider if:  You keep feeling nauseous or you keep vomiting.  You feel light-headed.  You develop a rash.  You have a fever. Get help right away if:  You have trouble breathing. This information is not intended to replace advice given to you by your health care provider. Make  sure you discuss any questions you have with your health care provider. Document Released: 07/01/2013 Document Revised: 02/13/2016 Document Reviewed: 12/31/2015 Elsevier Interactive Patient Education  [employer].

## 2017-10-22 NOTE — Consult Note (Signed)
Chief Complaint: Patient was seen in consultation today for ultrasound-guided random core liver biopsy  Referring Physician(s): [place]  Supervising Physician: Aletta Edouard  Patient Status: [facility]  History of Present Illness: Susan [name] is a 52 y.o. female with history of elevated liver function tests, diabetes, cirrhosis by imaging who presents today for ultrasound-guided random core liver biopsy for further evaluation.  Past Medical History:  Diagnosis Date  . Contact lens/glasses fitting    wears contacts or glasses  . Diabetes mellitus without complication ([place])   . GERD (gastroesophageal reflux disease)    occ pepcid ac  . Hypertension   . Periumbilical hernia   . Snores     Past Surgical History:  Procedure Laterality Date  . DILATION AND CURETTAGE OF UTERUS  11/1995, 10/1997  . TUBAL LIGATION  10/1997  . UMBILICAL HERNIA REPAIR N/A 08/11/2013   Procedure: HERNIA REPAIR UMBILICAL ADULT;  Surgeon: Gwenyth Ober, MD;  Location: [facility];  Service: General;  Laterality: N/A;    Allergies: Grapefruit concentrate; Sulfa antibiotics; Mushroom extract complex; Shellfish allergy; and Strawberry extract  Medications: Prior to Admission medications   Medication Sig Start Date End Date Taking? Authorizing Provider  lisinopril-hydrochlorothiazide (PRINZIDE,ZESTORETIC) 20-12.5 MG tablet TAKE 1 TABLET BY MOUTH EVERY DAY 09/18/17  Yes Baity, Coralie Keens, NP  Blood Glucose Monitoring Suppl (ONE TOUCH ULTRA 2) w/Device KIT  11/20/16   [provider]  glipiZIDE (GLUCOTROL XL) 10 MG 24 hr tablet Take 1 tablet (10 mg total) by mouth daily with breakfast. 08/19/17   Jearld Fenton, NP  ONE TOUCH ULTRA TEST test strip  11/20/16   [provider]  Jonetta Speak LANCETS 11X [place]  11/20/16   [provider]  rosuvastatin (CRESTOR) 10 MG tablet TAKE 1 TABLET BY MOUTH EVERY DAY 09/18/17   Jearld Fenton, NP     Family  History  Problem Relation Age of Onset  . Breast cancer Mother   . Schizophrenia Mother   . Depression Mother   . Renal Disease Father   . Diabetes Father   . Other Father        Agent Orange  . Lung cancer Maternal Grandmother   . Lung cancer Maternal Grandfather   . Heart disease Maternal Uncle   . Diabetes Other        Aunts and Uncles on both sides  . Heart disease Maternal Aunt     Social History   Socioeconomic History  . Marital status: Married    Spouse name: None  . Number of children: 1  . Years of education: None  . Highest education level: None  Social Needs  . Financial resource strain: None  . Food insecurity - worry: None  . Food insecurity - inability: None  . Transportation needs - medical: None  . Transportation needs - non-medical: None  Occupational History  . Occupation: Professional  Tobacco Use  . Smoking status: Never Smoker  . Smokeless tobacco: Never Used  Substance and Sexual Activity  . Alcohol use: Yes    Comment: wine 2 times a year  . Drug use: No  . Sexual activity: Yes  Other Topics Concern  . None  Social History Narrative  . None      Review of Systems patient currently denies fever, headache, chest pain, dyspnea, cough, abdominal pain, nausea, vomiting.  She does report some occasional constipation, occasional lower back pain as well as one instance of bloody stool,  possibly hemorrhoidal in origin.  Vital Signs: BP (!) 149/86 (BP Location: Right Arm)   Pulse 72   Temp 98 F (36.7 C) (Oral)   Resp 16   LMP 08/31/2017   SpO2 99%   Physical Exam patient awake, alert.  Chest clear to auscultation bilaterally.  Heart with regular rate and rhythm.  Abdomen soft, positive bowel sounds, nontender.  No lower extremity edema.  Imaging: No results found.  Labs:  CBC: Recent Labs    01/22/17 1121 10/07/17 1603  WBC 10.4 7.7  HGB 12.7 14.0  HCT 38.6 42.5  PLT 327.0 224.0    COAGS: Recent Labs    10/07/17 1603    INR 1.1*    BMP: Recent Labs    01/22/17 1121 04/25/17 0826 07/30/17 0827  NA 132* 134* 134*  K 4.1 4.0 4.2  CL 97 100 99  CO2 30 28 28  GLUCOSE 179* 165* 163*  BUN 10 13 12  CALCIUM 10.0 9.6 10.1  CREATININE 0.74 0.87 0.85    LIVER FUNCTION TESTS: Recent Labs    01/22/17 1121 04/25/17 0826 07/30/17 0827 10/07/17 1603  BILITOT 0.6 0.4 0.6 0.7  AST 45* 59* 70* 61*  ALT 62* 61* 88* 98*  ALKPHOS 227* 262* 239* 325*  PROT 8.6* 7.9 8.6* 8.5*  ALBUMIN 3.8 3.6 3.8 3.7    TUMOR MARKERS: Recent Labs    10/07/17 1603  AFPTM 8.5*    Assessment and Plan: 52 y.o. female with history of elevated liver function tests, diabetes, cirrhosis by imaging who presents today for ultrasound-guided random core liver biopsy for further evaluation.Risks and benefits discussed with the patient including, but not limited to bleeding, infection, damage to adjacent structures or low yield requiring additional tests.All of the patient's questions were answered, patient is agreeable to proceed.Consent signed and in chart.     Thank you for this interesting consult.  I greatly enjoyed meeting Susan [name] and look forward to participating in their care.  A copy of this report was sent to the requesting provider on this date.  Electronically Signed: D. Kevin , PA-C 10/22/2017, 11:37 AM   I spent a total of 25 minutes in face to face in clinical consultation, greater than 50% of which was counseling/coordinating care for ultrasound-guided random core liver biopsy  

## 2017-10-22 NOTE — Procedures (Signed)
Interventional Radiology Procedure Note  Procedure: US guided liver biopsy  Complications: None  Estimated Blood Loss: < 10 mL  Findings: 18 G core biopsy x 2 via 17 G needle in right lobe of liver.  Gelfoam slurry injected on completion as outer needle retracted and removed.  Venetia Night. Kathlene Cote, M.D Pager:  [phone]

## 2017-11-07 ENCOUNTER — Other Ambulatory Visit: Payer: Self-pay

## 2017-11-07 DIAGNOSIS — K760 Fatty (change of) liver, not elsewhere classified: Secondary | ICD-10-CM

## 2017-11-15 ENCOUNTER — Telehealth: Payer: Self-pay

## 2017-11-15 ENCOUNTER — Other Ambulatory Visit: Payer: Self-pay

## 2017-11-15 NOTE — Telephone Encounter (Signed)
Called [facility] today, left a message to see if they have scheduled patient for 2nd opinion consult, asking them to call back to let us know when it is scheduled. Also contacted [facility] to see if they have contacted patient about scheduling MRCP, they lvm on 2/18 for patient. Called patient today, [place] and [place] not get the message. I have given her their phone number to call them to schedule this test.

## 2017-11-18 ENCOUNTER — Ambulatory Visit (INDEPENDENT_AMBULATORY_CARE_PROVIDER_SITE_OTHER): Payer: BLUE CROSS/BLUE SHIELD | Admitting: Gastroenterology

## 2017-11-18 DIAGNOSIS — Z23 Encounter for immunization: Secondary | ICD-10-CM

## 2017-11-20 ENCOUNTER — Telehealth: Payer: Self-pay

## 2017-11-20 NOTE — Telephone Encounter (Signed)
Patient is scheduled for 12/06/17 at [facility], patient needed a Friday appointment.

## 2017-11-28 ENCOUNTER — Other Ambulatory Visit: Payer: Self-pay | Admitting: Internal Medicine

## 2017-11-28 ENCOUNTER — Ambulatory Visit
Admission: RE | Admit: 2017-11-28 | Discharge: 2017-11-28 | Disposition: A | Discharge status: Home / Self Care | Payer: BLUE CROSS/BLUE SHIELD | Source: Ambulatory Visit | Attending: Gastroenterology | Admitting: Gastroenterology

## 2017-11-28 DIAGNOSIS — K760 Fatty (change of) liver, not elsewhere classified: Secondary | ICD-10-CM

## 2017-11-28 DIAGNOSIS — K7460 Unspecified cirrhosis of liver: Secondary | ICD-10-CM | POA: Diagnosis not present

## 2017-11-28 MED ORDER — GADOBENATE DIMEGLUMINE 529 MG/ML IV SOLN
20.0000 mL | Freq: Once | INTRAVENOUS | Status: AC | PRN
  Administered 2017-11-28: 20 mL via INTRAVENOUS

## 2017-12-05 ENCOUNTER — Telehealth: Payer: Self-pay

## 2017-12-05 NOTE — Telephone Encounter (Signed)
Called patient had to lvm that per Dr. Havery Moros it is okay to schedule screening EGD (screen for varices)/colonoscopy (screening for colon cancer) at our facility. Asked her to call back to schedule, will also need a pre-visit appointment with endoscopy nurse.

## 2017-12-18 DIAGNOSIS — K743 Primary biliary cirrhosis: Secondary | ICD-10-CM | POA: Diagnosis not present

## 2017-12-24 DIAGNOSIS — K740 Hepatic fibrosis: Secondary | ICD-10-CM | POA: Diagnosis not present

## 2018-01-02 ENCOUNTER — Encounter: Payer: Self-pay | Admitting: Internal Medicine

## 2018-01-02 ENCOUNTER — Ambulatory Visit: Payer: BLUE CROSS/BLUE SHIELD | Admitting: Internal Medicine

## 2018-01-02 VITALS — BP 120/82 | HR 71 | Temp 98.1°F | Wt 198.0 lb

## 2018-01-02 DIAGNOSIS — K743 Primary biliary cirrhosis: Secondary | ICD-10-CM | POA: Diagnosis not present

## 2018-01-02 DIAGNOSIS — E7800 Pure hypercholesterolemia, unspecified: Secondary | ICD-10-CM | POA: Insufficient documentation

## 2018-01-02 DIAGNOSIS — I10 Essential (primary) hypertension: Secondary | ICD-10-CM | POA: Diagnosis not present

## 2018-01-02 DIAGNOSIS — E119 Type 2 diabetes mellitus without complications: Secondary | ICD-10-CM

## 2018-01-02 DIAGNOSIS — K219 Gastro-esophageal reflux disease without esophagitis: Secondary | ICD-10-CM | POA: Diagnosis not present

## 2018-01-02 NOTE — Assessment & Plan Note (Signed)
Following with [facility] today Continue Urosidol as prescribed

## 2018-01-02 NOTE — Progress Notes (Signed)
Subjective:    Patient ID: Susan [name], female    DOB: April 23, 1966, 52 y.o.   MRN: [id number]  HPI  Pt presents to the clinic today to follow up chronic conditions.   HTN: Her BP today is 120/82. She is taking Lisinopril HCT as prescribed. ECG from 07/2013 reviewed.  HLD: Her last LDL was 103, 07/2017. She stopped taking Rosuvastatin due to elevated liver enzymes. She denies myalgias. She has been trying to consume a low fat diet.  DM 2: Her last A1C was 8%, 07/2017. She is taking Glipizide as prescribed, although she feels like this may be causing her to itch some. She does not really monitor her sugars. She checks her feet routinely. Her last eye exam was 09/2017, no retinopathy. Flu 07/2017. Pneumovax 10/2016.  GERD: Triggered by eating too fast. She is not currently taking anything OTC for this.  PBC: She had elevated liver enzymes. RUQ ultrasound was concerning for cirrhosis. She had a liver biopsy which was normal per her report. She follows with Dr. Enis Gash. She has also been referred to the [facility]. She has an UGI and colonoscopy pending. She is taking Urosidol as prescribed.   Review of Systems  Past Medical History:  Diagnosis Date  . Contact lens/glasses fitting    wears contacts or glasses  . Diabetes mellitus without complication ([place])   . GERD (gastroesophageal reflux disease)    occ pepcid ac  . Hypertension   . Periumbilical hernia   . Snores     Current Outpatient Medications  Medication Sig Dispense Refill  . Blood Glucose Monitoring Suppl (ONE TOUCH ULTRA 2) w/Device KIT     . glipiZIDE (GLUCOTROL XL) 10 MG 24 hr tablet TAKE 1 TABLET (10 MG TOTAL) BY MOUTH DAILY WITH BREAKFAST. 30 tablet 2  . lisinopril-hydrochlorothiazide (PRINZIDE,ZESTORETIC) 20-12.5 MG tablet Take 1 tablet by mouth daily. MUST SCHEDULE ANNUAL PHYSICAL 30 tablet 1  . ONE TOUCH ULTRA TEST test strip     . ONETOUCH DELICA LANCETS 01B MISC     . rosuvastatin (CRESTOR) 10 MG  tablet Take 1 tablet (10 mg total) by mouth daily. MUST SCHEDULE ANNUAL PHYSICAL 30 tablet 1   No current facility-administered medications for this visit.     Allergies  Allergen Reactions  . Grapefruit Concentrate Other (See Comments)    blisters  . Sulfa Antibiotics Rash and Other (See Comments)    Severe dehydration  . Mushroom Extract Complex   . Shellfish Allergy   . Strawberry Extract     Family History  Problem Relation Age of Onset  . Breast cancer Mother   . Schizophrenia Mother   . Depression Mother   . Renal Disease Father   . Diabetes Father   . Other Father        Agent Orange  . Lung cancer Maternal Grandmother   . Lung cancer Maternal Grandfather   . Heart disease Maternal Uncle   . Diabetes Other        Aunts and Uncles on both sides  . Heart disease Maternal Aunt     Social History   Socioeconomic History  . Marital status: Married    Spouse name: Not on file  . Number of children: 1  . Years of education: Not on file  . Highest education level: Not on file  Occupational History  . Occupation: Medical illustrator  Social Needs  . Financial resource strain: Not on file  . Food insecurity:    Worry:  Not on file    Inability: Not on file  . Transportation needs:    Medical: Not on file    Non-medical: Not on file  Tobacco Use  . Smoking status: Never Smoker  . Smokeless tobacco: Never Used  Substance and Sexual Activity  . Alcohol use: Yes    Comment: wine 2 times a year  . Drug use: No  . Sexual activity: Yes  Lifestyle  . Physical activity:    Days per week: Not on file    Minutes per session: Not on file  . Stress: Not on file  Relationships  . Social connections:    Talks on phone: Not on file    Gets together: Not on file    Attends religious service: Not on file    Active member of club or organization: Not on file    Attends meetings of clubs or organizations: Not on file    Relationship status: Not on file  . Intimate partner  violence:    Fear of current or ex partner: Not on file    Emotionally abused: Not on file    Physically abused: Not on file    Forced sexual activity: Not on file  Other Topics Concern  . Not on file  Social History Narrative  . Not on file     Constitutional: Denies fever, malaise, fatigue, headache or abrupt weight changes.  HEENT: Denies eye pain, eye redness, ear pain, ringing in the ears, wax buildup, runny nose, nasal congestion, bloody nose, or sore throat. Respiratory: Denies difficulty breathing, shortness of breath, cough or sputum production.   Cardiovascular: Denies chest pain, chest tightness, palpitations or swelling in the hands or feet.  Gastrointestinal: Denies abdominal pain, bloating, constipation, diarrhea or blood in the stool.  GU: Denies urgency, frequency, pain with urination, burning sensation, blood in urine, odor or discharge. Musculoskeletal: Denies decrease in range of motion, difficulty with gait, muscle pain or joint pain and swelling.  Skin: Denies redness, rashes, lesions or ulcercations.  Neurological: Denies dizziness, difficulty with memory, difficulty with speech or problems with balance and coordination.  Psych: Denies anxiety, depression, SI/HI.  No other specific complaints in a complete review of systems (except as listed in HPI above).     Objective:   Physical Exam  BP 120/82   Pulse 71   Temp 98.1 F (36.7 C) (Oral)   Wt 198 lb (89.8 kg)   SpO2 98%   BMI 38.99 kg/m  Wt Readings from Last 3 Encounters:  01/02/18 198 lb (89.8 kg)  10/07/17 201 lb 2 oz (91.2 kg)  07/30/17 203 lb (92.1 kg)    General: Appears her stated age, obese in NAD. Skin: Warm, dry and intact. No ulcerations noted. Cardiovascular: Normal rate and rhythm. S1,S2 noted.  Murmur noted. No JVD or BLE edema. No carotid bruits noted. Pulmonary/Chest: Normal effort and positive vesicular breath sounds. No respiratory distress. No wheezes, rales or ronchi noted.    Abdomen: Soft and nontender. Normal bowel sounds. No distention or masses noted.  Neurological: Alert and oriented. Sensation intact to BLE. Psychiatric: Mood and affect normal. Behavior is normal. Judgment and thought content normal.    BMET    Component Value Date/Time   NA 134 (L) 07/30/2017 0827   K 4.2 07/30/2017 0827   CL 99 07/30/2017 0827   CO2 28 07/30/2017 0827   GLUCOSE 163 (H) 07/30/2017 0827   BUN 12 07/30/2017 0827   CREATININE 0.85 07/30/2017 0827  CALCIUM 10.1 07/30/2017 0827   GFRNONAA >90 02/21/2014 1510   GFRAA >90 02/21/2014 1510    Lipid Panel     Component Value Date/Time   CHOL 229 (H) 07/30/2017 0827   TRIG 74.0 07/30/2017 0827   HDL 111.10 07/30/2017 0827   CHOLHDL 2 07/30/2017 0827   VLDL 14.8 07/30/2017 0827   LDLCALC 103 (H) 07/30/2017 0827    CBC    Component Value Date/Time   WBC 9.2 10/22/2017 1116   RBC 4.68 10/22/2017 1116   HGB 14.2 10/22/2017 1116   HCT 41.1 10/22/2017 1116   PLT 249 10/22/2017 1116   MCV 87.8 10/22/2017 1116   MCH 30.3 10/22/2017 1116   MCHC 34.5 10/22/2017 1116   RDW 12.3 10/22/2017 1116   LYMPHSABS 3.0 10/07/2017 1603   MONOABS 0.5 10/07/2017 1603   EOSABS 0.3 10/07/2017 1603   BASOSABS 0.1 10/07/2017 1603    Hgb A1C Lab Results  Component Value Date   HGBA1C 8.0 (H) 07/30/2017            Assessment & Plan:

## 2018-01-02 NOTE — Assessment & Plan Note (Signed)
A1C today No microalbumin secondary to ACEI therapy Encouraged her to consume a low carb diet and exercise for weight loss Encouraged yearly eye exam Foot exam today Continue Glipizide- concerned that this may be causing some itching Fly and Pneumovax UTD

## 2018-01-02 NOTE — Assessment & Plan Note (Signed)
CBC and CMET today Continue Lisinopril HCT Reinforced DASH diet and exercise for weight loss

## 2018-01-02 NOTE — Assessment & Plan Note (Signed)
CMET and Lipid profile today No statins secondary to PBC Encouraged her to consume a low fat diet

## 2018-01-02 NOTE — Patient Instructions (Signed)
Fat and Cholesterol Restricted Diet Getting too much fat and cholesterol in your diet may cause health problems. Following this diet helps keep your fat and cholesterol at normal levels. This can keep you from getting sick. What types of fat should I choose?  Choose monosaturated and polyunsaturated fats. These are found in foods such as olive oil, canola oil, flaxseeds, walnuts, almonds, and seeds.  Eat more omega-3 fats. Good choices include salmon, mackerel, sardines, tuna, flaxseed oil, and ground flaxseeds.  Limit saturated fats. These are in animal products such as meats, butter, and cream. They can also be in plant products such as palm oil, palm kernel oil, and coconut oil.  Avoid foods with partially hydrogenated oils in them. These contain trans fats. Examples of foods that have trans fats are stick margarine, some tub margarines, cookies, crackers, and other baked goods. What general guidelines do I need to follow?  Check food labels. Look for the words "trans fat" and "saturated fat."  When preparing a meal: ? Fill half of your plate with vegetables and green salads. ? Fill one fourth of your plate with whole grains. Look for the word "whole" as the first word in the ingredient list. ? Fill one fourth of your plate with lean protein foods.  Eat more foods that have fiber, like apples, carrots, beans, peas, and barley.  Eat more home-cooked foods. Eat less at restaurants and buffets.  Limit or avoid alcohol.  Limit foods high in starch and sugar.  Limit fried foods.  Cook foods without frying them. Baking, boiling, grilling, and broiling are all great options.  Lose weight if you are overweight. Losing even a small amount of weight can help your overall health. It can also help prevent diseases such as diabetes and heart disease. What foods can I eat? Grains Whole grains, such as whole wheat or whole grain breads, crackers, cereals, and pasta. Unsweetened oatmeal,  bulgur, barley, quinoa, or brown rice. Corn or whole wheat flour tortillas. Vegetables Fresh or frozen vegetables (raw, steamed, roasted, or grilled). Green salads. Fruits All fresh, canned (in natural juice), or frozen fruits. Meat and Other Protein Products Ground beef (85% or leaner), grass-fed beef, or beef trimmed of fat. Skinless chicken or turkey. Ground chicken or turkey. Pork trimmed of fat. All fish and seafood. Eggs. Dried beans, peas, or lentils. Unsalted nuts or seeds. Unsalted canned or dry beans. Dairy Low-fat dairy products, such as skim or 1% milk, 2% or reduced-fat cheeses, low-fat ricotta or cottage cheese, or plain low-fat yogurt. Fats and Oils Tub margarines without trans fats. Light or reduced-fat mayonnaise and salad dressings. Avocado. Olive, canola, sesame, or safflower oils. Natural peanut or almond butter (choose ones without added sugar and oil). The items listed above may not be a complete list of recommended foods or beverages. Contact your dietitian for more options. What foods are not recommended? Grains White bread. White pasta. White rice. Cornbread. Bagels, pastries, and croissants. Crackers that contain trans fat. Vegetables White potatoes. Corn. Creamed or fried vegetables. Vegetables in a cheese sauce. Fruits Dried fruits. Canned fruit in light or heavy syrup. Fruit juice. Meat and Other Protein Products Fatty cuts of meat. Ribs, chicken wings, bacon, sausage, bologna, salami, chitterlings, fatback, hot dogs, bratwurst, and packaged luncheon meats. Liver and organ meats. Dairy Whole or 2% milk, cream, half-and-half, and cream cheese. Whole milk cheeses. Whole-fat or sweetened yogurt. Full-fat cheeses. Nondairy creamers and whipped toppings. Processed cheese, cheese spreads, or cheese curds. Sweets and Desserts Corn   syrup, sugars, honey, and molasses. Candy. Jam and jelly. Syrup. Sweetened cereals. Cookies, pies, cakes, donuts, muffins, and ice  cream. Fats and Oils Butter, stick margarine, lard, shortening, ghee, or bacon fat. Coconut, palm kernel, or palm oils. Beverages Alcohol. Sweetened drinks (such as sodas, lemonade, and fruit drinks or punches). The items listed above may not be a complete list of foods and beverages to avoid. Contact your dietitian for more information. This information is not intended to replace advice given to you by your health care provider. Make sure you discuss any questions you have with your health care provider. Document Released: 03/11/2012 Document Revised: 05/17/2016 Document Reviewed: 12/10/2013 Elsevier Interactive Patient Education  2018 Elsevier Inc.  

## 2018-01-02 NOTE — Assessment & Plan Note (Signed)
Currently not an issue Will monitor 

## 2018-01-03 LAB — COMPREHENSIVE METABOLIC PANEL
ALT: 23 U/L (ref 0–35)
AST: 28 U/L (ref 0–37)
Albumin: 3.7 g/dL (ref 3.5–5.2)
Alkaline Phosphatase: 167 U/L — ABNORMAL HIGH (ref 39–117)
BUN: 12 mg/dL (ref 6–23)
CO2: 28 mEq/L (ref 19–32)
Calcium: 9.7 mg/dL (ref 8.4–10.5)
Chloride: 100 mEq/L (ref 96–112)
Creatinine, Ser: 0.83 mg/dL (ref 0.40–1.20)
GFR: 93.12 mL/min (ref >60.00)
Glucose, Bld: 282 mg/dL — ABNORMAL HIGH (ref 70–99)
Potassium: 4 mEq/L (ref 3.5–5.1)
Sodium: 136 mEq/L (ref 135–145)
Total Bilirubin: 0.6 mg/dL (ref 0.2–1.2)
Total Protein: 8.2 g/dL (ref 6.0–8.3)

## 2018-01-03 LAB — LIPID PANEL
Cholesterol: 269 mg/dL — ABNORMAL HIGH (ref 0–200)
HDL: 66.9 mg/dL (ref >39.00)
LDL Cholesterol: 172 mg/dL — ABNORMAL HIGH (ref 0–99)
NonHDL: 202.3
Total CHOL/HDL Ratio: 4
Triglycerides: 154 mg/dL — ABNORMAL HIGH (ref 0.0–149.0)
VLDL: 30.8 mg/dL (ref 0.0–40.0)

## 2018-01-03 LAB — CBC
HCT: 38.1 % (ref 36.0–46.0)
Hemoglobin: 13 g/dL (ref 12.0–15.0)
MCHC: 34.1 g/dL (ref 30.0–36.0)
MCV: 90.8 fl (ref 78.0–100.0)
Platelets: 271 10*3/uL (ref 150.0–400.0)
RBC: 4.19 Mil/uL (ref 3.87–5.11)
RDW: 13.3 % (ref 11.5–15.5)
WBC: 8 10*3/uL (ref 4.0–10.5)

## 2018-01-03 LAB — HEMOGLOBIN A1C: Hgb A1c MFr Bld: 8.5 % — ABNORMAL HIGH (ref 4.6–6.5)

## 2018-01-08 ENCOUNTER — Other Ambulatory Visit: Payer: Self-pay | Admitting: Internal Medicine

## 2018-01-08 MED ORDER — LISINOPRIL-HYDROCHLOROTHIAZIDE 20-12.5 MG PO TABS: 1.0000 | ORAL_TABLET | Freq: Every day | ORAL | 3 refills | Status: DC

## 2018-01-08 MED ORDER — SIMVASTATIN 10 MG PO TABS: 10.0000 mg | ORAL_TABLET | Freq: Every day | ORAL | 2 refills | Status: DC

## 2018-01-08 MED ORDER — GLIPIZIDE ER 10 MG PO TB24: 10.0000 mg | ORAL_TABLET | Freq: Two times a day (BID) | ORAL | 3 refills | Status: DC

## 2018-01-08 NOTE — Addendum Note (Signed)
Addended by: Lurlean Nanny on: 01/08/2018 05:06 PM   Modules accepted: Orders

## 2018-03-07 ENCOUNTER — Other Ambulatory Visit: Payer: Self-pay | Admitting: Internal Medicine

## 2018-03-12 ENCOUNTER — Ambulatory Visit: Payer: BLUE CROSS/BLUE SHIELD | Admitting: Internal Medicine

## 2018-03-12 ENCOUNTER — Encounter: Payer: Self-pay | Admitting: Internal Medicine

## 2018-03-12 ENCOUNTER — Ambulatory Visit (INDEPENDENT_AMBULATORY_CARE_PROVIDER_SITE_OTHER)
Admission: RE | Admit: 2018-03-12 | Discharge: 2018-03-12 | Disposition: A | Discharge status: Home / Self Care | Payer: BLUE CROSS/BLUE SHIELD | Source: Ambulatory Visit | Attending: Internal Medicine | Admitting: Internal Medicine

## 2018-03-12 VITALS — BP 136/92 | HR 85 | Temp 98.1°F | Wt 205.0 lb

## 2018-03-12 DIAGNOSIS — M25512 Pain in left shoulder: Secondary | ICD-10-CM | POA: Diagnosis not present

## 2018-03-12 MED ORDER — PREDNISONE 20 MG PO TABS
40.0000 mg | ORAL_TABLET | Freq: Every day | ORAL | 0 refills | Status: DC
Start: 2018-03-12 — End: 2018-04-15

## 2018-03-12 NOTE — Progress Notes (Signed)
Subjective:    Patient ID: Susan [name], female    DOB: 06-27-1966, 52 y.o.   MRN: [id number]  HPI  Pt presents to the clinic today with c/o left shoulder pain. This started 1 week ago. She describes the pain as dull and achy. The pain can be sharp and stabbing. She denies numbness, tingling or weakness. She denies any injury to the area that she is aware of. She has tried Aleve and Ibuprofen, heat, ice and Blue Emu with minimal relief.  Review of Systems      Past Medical History:  Diagnosis Date  . Contact lens/glasses fitting    wears contacts or glasses  . Diabetes mellitus without complication ([place])   . GERD (gastroesophageal reflux disease)    occ pepcid ac  . Hypertension   . Periumbilical hernia   . Snores     Current Outpatient Medications  Medication Sig Dispense Refill  . Blood Glucose Monitoring Suppl (ONE TOUCH ULTRA 2) w/Device KIT     . glipiZIDE (GLUCOTROL XL) 10 MG 24 hr tablet Take 1 tablet (10 mg total) by mouth 2 (two) times daily with a meal. 60 tablet 3  . lisinopril-hydrochlorothiazide (PRINZIDE,ZESTORETIC) 20-12.5 MG tablet Take 1 tablet by mouth daily. 30 tablet 3  . ONE TOUCH ULTRA TEST test strip     . ONETOUCH DELICA LANCETS 62E MISC     . simvastatin (ZOCOR) 10 MG tablet Take 1 tablet (10 mg total) by mouth at bedtime. 30 tablet 2  . ursodiol (ACTIGALL) 250 MG tablet Take 250 mg by mouth 2 (two) times daily.  3   No current facility-administered medications for this visit.     Allergies  Allergen Reactions  . Grapefruit Concentrate Other (See Comments)    blisters  . Sulfa Antibiotics Rash and Other (See Comments)    Severe dehydration  . Mushroom Extract Complex   . Shellfish Allergy   . Strawberry Extract     Family History  Problem Relation Age of Onset  . Breast cancer Mother   . Schizophrenia Mother   . Depression Mother   . Renal Disease Father   . Diabetes Father   . Other Father        Agent Orange  . Lung cancer  Maternal Grandmother   . Lung cancer Maternal Grandfather   . Heart disease Maternal Uncle   . Diabetes Other        Aunts and Uncles on both sides  . Heart disease Maternal Aunt     Social History   Socioeconomic History  . Marital status: Married    Spouse name: Not on file  . Number of children: 1  . Years of education: Not on file  . Highest education level: Not on file  Occupational History  . Occupation: Medical illustrator  Social Needs  . Financial resource strain: Not on file  . Food insecurity:    Worry: Not on file    Inability: Not on file  . Transportation needs:    Medical: Not on file    Non-medical: Not on file  Tobacco Use  . Smoking status: Never Smoker  . Smokeless tobacco: Never Used  Substance and Sexual Activity  . Alcohol use: Yes    Comment: wine 2 times a year  . Drug use: No  . Sexual activity: Yes  Lifestyle  . Physical activity:    Days per week: Not on file    Minutes per session: Not on file  .  Stress: Not on file  Relationships  . Social connections:    Talks on phone: Not on file    Gets together: Not on file    Attends religious service: Not on file    Active member of club or organization: Not on file    Attends meetings of clubs or organizations: Not on file    Relationship status: Not on file  . Intimate partner violence:    Fear of current or ex partner: Not on file    Emotionally abused: Not on file    Physically abused: Not on file    Forced sexual activity: Not on file  Other Topics Concern  . Not on file  Social History Narrative  . Not on file     Constitutional: Denies fever, malaise, fatigue, headache or abrupt weight changes.  Musculoskeletal: Pt reports left shoulder pain. Denies difficulty with gait, muscle pain or joint swelling.   No other specific complaints in a complete review of systems (except as listed in HPI above).  Objective:   Physical Exam   BP (!) 136/92   Pulse 85   Temp 98.1 F (36.7 C)  (Oral)   Wt 205 lb (93 kg)   SpO2 98%   BMI 40.37 kg/m  Wt Readings from Last 3 Encounters:  03/12/18 205 lb (93 kg)  01/02/18 198 lb (89.8 kg)  10/07/17 201 lb 2 oz (91.2 kg)    General: Appears her stated age, obese in NAD. Cardiovascular: Radial pulse 2+ bilaterally. Musculoskeletal: Unable to externally rotate. Decreased internal rotation. Unable to abduct her left shoulder. Pain with palpation over the left AC joint, anterior proximal biceps tendon. No pain with palpation of the subacromial bursa, scapula. Strength 3/5 LUE, 5/5 RUE. Hand grips equal.  Neurological: Alert and oriented. Sensation intact to BUE.   BMET    Component Value Date/Time   NA 136 01/02/2018 1523   K 4.0 01/02/2018 1523   CL 100 01/02/2018 1523   CO2 28 01/02/2018 1523   GLUCOSE 282 (H) 01/02/2018 1523   BUN 12 01/02/2018 1523   CREATININE 0.83 01/02/2018 1523   CALCIUM 9.7 01/02/2018 1523   GFRNONAA >90 02/21/2014 1510   GFRAA >90 02/21/2014 1510    Lipid Panel     Component Value Date/Time   CHOL 269 (H) 01/02/2018 1523   TRIG 154.0 (H) 01/02/2018 1523   HDL 66.90 01/02/2018 1523   CHOLHDL 4 01/02/2018 1523   VLDL 30.8 01/02/2018 1523   LDLCALC 172 (H) 01/02/2018 1523    CBC    Component Value Date/Time   WBC 8.0 01/02/2018 1523   RBC 4.19 01/02/2018 1523   HGB 13.0 01/02/2018 1523   HCT 38.1 01/02/2018 1523   PLT 271.0 01/02/2018 1523   MCV 90.8 01/02/2018 1523   MCH 30.3 10/22/2017 1116   MCHC 34.1 01/02/2018 1523   RDW 13.3 01/02/2018 1523   LYMPHSABS 3.0 10/07/2017 1603   MONOABS 0.5 10/07/2017 1603   EOSABS 0.3 10/07/2017 1603   BASOSABS 0.1 10/07/2017 1603    Hgb A1C Lab Results  Component Value Date   HGBA1C 8.5 (H) 01/02/2018          Assessment & Plan:   Left Shoulder Pain:  Xray left shoulder today eRx for Pred Burst x 5 days, avoid NSAID's Tylenol 650 TID prn Alternate ice and heat throughout the day Consider PT vs MRI  Will follow up after xray,  return precautions discussed [name], NP

## 2018-03-12 NOTE — Patient Instructions (Signed)
Shoulder Exercises Ask your health care provider which exercises are safe for you. Do exercises exactly as told by your health care provider and adjust them as directed. It is normal to feel mild stretching, pulling, tightness, or discomfort as you do these exercises, but you should stop right away if you feel sudden pain or your pain gets worse.Do not begin these exercises until told by your health care provider. RANGE OF MOTION EXERCISES These exercises warm up your muscles and joints and improve the movement and flexibility of your shoulder. These exercises also help to relieve pain, numbness, and tingling. These exercises involve stretching your injured shoulder directly. Exercise A: Pendulum  1. Stand near a wall or a surface that you can hold onto for balance. 2. Bend at the waist and let your left / right arm hang straight down. Use your other arm to support you. Keep your back straight and do not lock your knees. 3. Relax your left / right arm and shoulder muscles, and move your hips and your trunk so your left / right arm swings freely. Your arm should swing because of the motion of your body, not because you are using your arm or shoulder muscles. 4. Keep moving your body so your arm swings in the following directions, as told by your health care provider: ? Side to side. ? Forward and backward. ? In clockwise and counterclockwise circles. 5. Continue each motion for __________ seconds, or for as long as told by your health care provider. 6. Slowly return to the starting position. Repeat __________ times. Complete this exercise __________ times a day. Exercise B:Flexion, Standing  1. Stand and hold a broomstick, a cane, or a similar object. Place your hands a little more than shoulder-width apart on the object. Your left / right hand should be palm-up, and your other hand should be palm-down. 2. Keep your elbow straight and keep your shoulder muscles relaxed. Push the stick down with  your healthy arm to raise your left / right arm in front of your body, and then over your head until you feel a stretch in your shoulder. ? Avoid shrugging your shoulder while you raise your arm. Keep your shoulder blade tucked down toward the middle of your back. 3. Hold for __________ seconds. 4. Slowly return to the starting position. Repeat __________ times. Complete this exercise __________ times a day. Exercise C: Abduction, Standing 1. Stand and hold a broomstick, a cane, or a similar object. Place your hands a little more than shoulder-width apart on the object. Your left / right hand should be palm-up, and your other hand should be palm-down. 2. While keeping your elbow straight and your shoulder muscles relaxed, push the stick across your body toward your left / right side. Raise your left / right arm to the side of your body and then over your head until you feel a stretch in your shoulder. ? Do not raise your arm above shoulder height, unless your health care provider tells you to do that. ? Avoid shrugging your shoulder while you raise your arm. Keep your shoulder blade tucked down toward the middle of your back. 3. Hold for __________ seconds. 4. Slowly return to the starting position. Repeat __________ times. Complete this exercise __________ times a day. Exercise D:Internal Rotation  1. Place your left / right hand behind your back, palm-up. 2. Use your other hand to dangle an exercise band, a towel, or a similar object over your shoulder. Grasp the band with   your left / right hand so you are holding onto both ends. 3. Gently pull up on the band until you feel a stretch in the front of your left / right shoulder. ? Avoid shrugging your shoulder while you raise your arm. Keep your shoulder blade tucked down toward the middle of your back. 4. Hold for __________ seconds. 5. Release the stretch by letting go of the band and lowering your hands. Repeat __________ times. Complete  this exercise __________ times a day. STRETCHING EXERCISES These exercises warm up your muscles and joints and improve the movement and flexibility of your shoulder. These exercises also help to relieve pain, numbness, and tingling. These exercises are done using your healthy shoulder to help stretch the muscles of your injured shoulder. Exercise E: Corner Stretch (External Rotation and Abduction)  1. Stand in a doorway with one of your feet slightly in front of the other. This is called a staggered stance. If you cannot reach your forearms to the door frame, stand facing a corner of a room. 2. Choose one of the following positions as told by your health care provider: ? Place your hands and forearms on the door frame above your head. ? Place your hands and forearms on the door frame at the height of your head. ? Place your hands on the door frame at the height of your elbows. 3. Slowly move your weight onto your front foot until you feel a stretch across your chest and in the front of your shoulders. Keep your head and chest upright and keep your abdominal muscles tight. 4. Hold for __________ seconds. 5. To release the stretch, shift your weight to your back foot. Repeat __________ times. Complete this stretch __________ times a day. Exercise F:Extension, Standing 1. Stand and hold a broomstick, a cane, or a similar object behind your back. ? Your hands should be a little wider than shoulder-width apart. ? Your palms should face away from your back. 2. Keeping your elbows straight and keeping your shoulder muscles relaxed, move the stick away from your body until you feel a stretch in your shoulder. ? Avoid shrugging your shoulders while you move the stick. Keep your shoulder blade tucked down toward the middle of your back. 3. Hold for __________ seconds. 4. Slowly return to the starting position. Repeat __________ times. Complete this exercise __________ times a day. STRENGTHENING  EXERCISES These exercises build strength and endurance in your shoulder. Endurance is the ability to use your muscles for a long time, even after they get tired. Exercise G:External Rotation  1. Sit in a stable chair without armrests. 2. Secure an exercise band at elbow height on your left / right side. 3. Place a soft object, such as a folded towel or a small pillow, between your left / right upper arm and your body to move your elbow a few inches away (about 10 cm) from your side. 4. Hold the end of the band so it is tight and there is no slack. 5. Keeping your elbow pressed against the soft object, move your left / right forearm out, away from your abdomen. Keep your body steady so only your forearm moves. 6. Hold for __________ seconds. 7. Slowly return to the starting position. Repeat __________ times. Complete this exercise __________ times a day. Exercise H:Shoulder Abduction  1. Sit in a stable chair without armrests, or stand. 2. Hold a __________ weight in your left / right hand, or hold an exercise band with both hands.   3. Start with your arms straight down and your left / right palm facing in, toward your body. 4. Slowly lift your left / right hand out to your side. Do not lift your hand above shoulder height unless your health care provider tells you that this is safe. ? Keep your arms straight. ? Avoid shrugging your shoulder while you do this movement. Keep your shoulder blade tucked down toward the middle of your back. 5. Hold for __________ seconds. 6. Slowly lower your arm, and return to the starting position. Repeat __________ times. Complete this exercise __________ times a day. Exercise I:Shoulder Extension 1. Sit in a stable chair without armrests, or stand. 2. Secure an exercise band to a stable object in front of you where it is at shoulder height. 3. Hold one end of the exercise band in each hand. Your palms should face each other. 4. Straighten your elbows and  lift your hands up to shoulder height. 5. Step back, away from the secured end of the exercise band, until the band is tight and there is no slack. 6. Squeeze your shoulder blades together as you pull your hands down to the sides of your thighs. Stop when your hands are straight down by your sides. Do not let your hands go behind your body. 7. Hold for __________ seconds. 8. Slowly return to the starting position. Repeat __________ times. Complete this exercise __________ times a day. Exercise J:Standing Shoulder Row 1. Sit in a stable chair without armrests, or stand. 2. Secure an exercise band to a stable object in front of you so it is at waist height. 3. Hold one end of the exercise band in each hand. Your palms should be in a thumbs-up position. 4. Bend each of your elbows to an "L" shape (about 90 degrees) and keep your upper arms at your sides. 5. Step back until the band is tight and there is no slack. 6. Slowly pull your elbows back behind you. 7. Hold for __________ seconds. 8. Slowly return to the starting position. Repeat __________ times. Complete this exercise __________ times a day. Exercise K:Shoulder Press-Ups  1. Sit in a stable chair that has armrests. Sit upright, with your feet flat on the floor. 2. Put your hands on the armrests so your elbows are bent and your fingers are pointing forward. Your hands should be about even with the sides of your body. 3. Push down on the armrests and use your arms to lift yourself off of the chair. Straighten your elbows and lift yourself up as much as you comfortably can. ? Move your shoulder blades down, and avoid letting your shoulders move up toward your ears. ? Keep your feet on the ground. As you get stronger, your feet should support less of your body weight as you lift yourself up. 4. Hold for __________ seconds. 5. Slowly lower yourself back into the chair. Repeat __________ times. Complete this exercise __________ times a  day. Exercise L: Wall Push-Ups  1. Stand so you are facing a stable wall. Your feet should be about one arm-length away from the wall. 2. Lean forward and place your palms on the wall at shoulder height. 3. Keep your feet flat on the floor as you bend your elbows and lean forward toward the wall. 4. Hold for __________ seconds. 5. Straighten your elbows to push yourself back to the starting position. Repeat __________ times. Complete this exercise __________ times a day. This information is not intended to replace advice   given to you by your health care provider. Make sure you discuss any questions you have with your health care provider. Document Released: 07/25/2005 Document Revised: 06/04/2016 Document Reviewed: 05/22/2015 Elsevier Interactive Patient Education  2018 [employer]  

## 2018-03-18 ENCOUNTER — Telehealth: Payer: Self-pay

## 2018-03-18 NOTE — Telephone Encounter (Signed)
-----   Message from [place] sent at 11/18/2017  1:47 PM EST ----- Patient is due for 6 month f/u standard Twinrix inj in July 2019.

## 2018-03-18 NOTE — Telephone Encounter (Signed)
Pt due for 3rd and final Twinrix on July 25th. Orders entered.  Called patient. Scheduled for 04-17-18 at 9:00am

## 2018-03-19 ENCOUNTER — Other Ambulatory Visit: Payer: Self-pay | Admitting: Internal Medicine

## 2018-03-19 DIAGNOSIS — M25512 Pain in left shoulder: Secondary | ICD-10-CM

## 2018-03-19 DIAGNOSIS — K743 Primary biliary cirrhosis: Secondary | ICD-10-CM | POA: Diagnosis not present

## 2018-03-19 DIAGNOSIS — E559 Vitamin D deficiency, unspecified: Secondary | ICD-10-CM | POA: Diagnosis not present

## 2018-03-24 ENCOUNTER — Other Ambulatory Visit: Payer: Self-pay | Admitting: Internal Medicine

## 2018-03-24 DIAGNOSIS — M25512 Pain in left shoulder: Secondary | ICD-10-CM | POA: Diagnosis not present

## 2018-04-07 ENCOUNTER — Other Ambulatory Visit: Payer: Self-pay | Admitting: Internal Medicine

## 2018-04-15 ENCOUNTER — Encounter: Payer: Self-pay | Admitting: Internal Medicine

## 2018-04-15 ENCOUNTER — Other Ambulatory Visit (HOSPITAL_COMMUNITY)
Admission: RE | Admit: 2018-04-15 | Discharge: 2018-04-15 | Disposition: A | Discharge status: Home / Self Care | Payer: BLUE CROSS/BLUE SHIELD | Source: Ambulatory Visit | Attending: Internal Medicine | Admitting: Internal Medicine

## 2018-04-15 ENCOUNTER — Ambulatory Visit (INDEPENDENT_AMBULATORY_CARE_PROVIDER_SITE_OTHER): Payer: BLUE CROSS/BLUE SHIELD | Admitting: Internal Medicine

## 2018-04-15 VITALS — BP 128/76 | HR 85 | Temp 98.1°F | Ht 59.75 in | Wt 202.2 lb

## 2018-04-15 DIAGNOSIS — K219 Gastro-esophageal reflux disease without esophagitis: Secondary | ICD-10-CM

## 2018-04-15 DIAGNOSIS — Z0000 Encounter for general adult medical examination without abnormal findings: Secondary | ICD-10-CM | POA: Diagnosis not present

## 2018-04-15 DIAGNOSIS — E119 Type 2 diabetes mellitus without complications: Secondary | ICD-10-CM

## 2018-04-15 DIAGNOSIS — Z1231 Encounter for screening mammogram for malignant neoplasm of breast: Secondary | ICD-10-CM | POA: Diagnosis not present

## 2018-04-15 DIAGNOSIS — Z1321 Encounter for screening for nutritional disorder: Secondary | ICD-10-CM

## 2018-04-15 DIAGNOSIS — Z1239 Encounter for other screening for malignant neoplasm of breast: Secondary | ICD-10-CM

## 2018-04-15 DIAGNOSIS — E7800 Pure hypercholesterolemia, unspecified: Secondary | ICD-10-CM

## 2018-04-15 DIAGNOSIS — K743 Primary biliary cirrhosis: Secondary | ICD-10-CM | POA: Diagnosis not present

## 2018-04-15 DIAGNOSIS — I10 Essential (primary) hypertension: Secondary | ICD-10-CM | POA: Diagnosis not present

## 2018-04-15 NOTE — Patient Instructions (Signed)

## 2018-04-15 NOTE — Progress Notes (Signed)
Subjective:    Patient ID: Susan [name], female    DOB: 12-22-1965, 52 y.o.   MRN: [id number]  HPI  Pt presents to the clinic today for her annual exam. She is also due to follow up chronic conditions.   HLD: Her last LDL was 172, 12/2017. She is taking Simvastatin as prescribed. She tries to consume a low fat diet.   HTN: Her BP today is 128/76. She is taking Lisinopril HCT as prescribed. ECG from 07/2013 reviewed.  GERD: Triggered by eating too fast. She in not taking any medications OTC.  PBC: She is taking Urosidiol as prescribed. She follows with Dr. Enis Gash.  DM 2: Her last A1C was 8.5%, 12/2017. She is taking Glipizide as prescribed. She does not check her sugars. She checks her feet daily. Her last eye exam was within the last year. Her pneumovax is UTD.  Flu: 07/2017 Tetanus: 07/2013 Pneumovax: 10/2016 Pap Smear: 03/2012 Mammogram: 09/2012 Colon Screening: never Vision Screening: annually Dentist: biannually  Diet: She does not eat a lot of meat. She consumes fruits and veggies daily. She tries to avoid fried foods. She drinks mostly water, coffee. Exercise: walking,    Review of Systems      Past Medical History:  Diagnosis Date  . Contact lens/glasses fitting    wears contacts or glasses  . Diabetes mellitus without complication ([place])   . GERD (gastroesophageal reflux disease)    occ pepcid ac  . Hypertension   . Periumbilical hernia   . Snores     Current Outpatient Medications  Medication Sig Dispense Refill  . Blood Glucose Monitoring Suppl (ONE TOUCH ULTRA 2) w/Device KIT     . Cholecalciferol (VITAMIN D3) 50000 units CAPS 1 CAPSULE ORAL QWEEK (EVERY WEEK),X12 WEEK  0  . glipiZIDE (GLUCOTROL XL) 10 MG 24 hr tablet Take 1 tablet (10 mg total) by mouth 2 (two) times daily with a meal. 60 tablet 3  . lisinopril-hydrochlorothiazide (PRINZIDE,ZESTORETIC) 20-12.5 MG tablet TAKE 1 TABLET BY MOUTH EVERY DAY 30 tablet 0  . Multiple Vitamins-Minerals  (WOMENS MULTIVITAMIN PO) Take 2 tablets by mouth daily.    . ONE TOUCH ULTRA TEST test strip     . ONETOUCH DELICA LANCETS 11M MISC     . simvastatin (ZOCOR) 10 MG tablet TAKE 1 TABLET BY MOUTH EVERYDAY AT BEDTIME 30 tablet 0  . ursodiol (ACTIGALL) 250 MG tablet Take 250 mg by mouth 2 (two) times daily.  3   No current facility-administered medications for this visit.     Allergies  Allergen Reactions  . Grapefruit Concentrate Other (See Comments)    blisters  . Sulfa Antibiotics Rash and Other (See Comments)    Severe dehydration  . Mushroom Extract Complex   . Shellfish Allergy   . Strawberry Extract     Family History  Problem Relation Age of Onset  . Breast cancer Mother   . Schizophrenia Mother   . Depression Mother   . Renal Disease Father   . Diabetes Father   . Other Father        Agent Orange  . Lung cancer Maternal Grandmother   . Lung cancer Maternal Grandfather   . Heart disease Maternal Uncle   . Diabetes Other        Aunts and Uncles on both sides  . Heart disease Maternal Aunt     Social History   Socioeconomic History  . Marital status: Married    Spouse name: Not on file  .  Number of children: 1  . Years of education: Not on file  . Highest education level: Not on file  Occupational History  . Occupation: Medical illustrator  Social Needs  . Financial resource strain: Not on file  . Food insecurity:    Worry: Not on file    Inability: Not on file  . Transportation needs:    Medical: Not on file    Non-medical: Not on file  Tobacco Use  . Smoking status: Never Smoker  . Smokeless tobacco: Never Used  Substance and Sexual Activity  . Alcohol use: Yes    Comment: wine 2 times a year  . Drug use: No  . Sexual activity: Yes  Lifestyle  . Physical activity:    Days per week: Not on file    Minutes per session: Not on file  . Stress: Not on file  Relationships  . Social connections:    Talks on phone: Not on file    Gets together: Not on file      Attends religious service: Not on file    Active member of club or organization: Not on file    Attends meetings of clubs or organizations: Not on file    Relationship status: Not on file  . Intimate partner violence:    Fear of current or ex partner: Not on file    Emotionally abused: Not on file    Physically abused: Not on file    Forced sexual activity: Not on file  Other Topics Concern  . Not on file  Social History Narrative  . Not on file     Constitutional: Pt reports weight gain. Denies fever, malaise, fatigue, headache.  HEENT: Denies eye pain, eye redness, ear pain, ringing in the ears, wax buildup, runny nose, nasal congestion, bloody nose, or sore throat. Respiratory: Denies difficulty breathing, shortness of breath, cough or sputum production.   Cardiovascular: Pt reports swelling in her legs. Denies chest pain, chest tightness, palpitations or swelling in the hands.  Gastrointestinal: Denies abdominal pain, bloating, constipation, diarrhea or blood in the stool.  GU: Denies urgency, frequency, pain with urination, burning sensation, blood in urine, odor or discharge. Musculoskeletal: Denies decrease in range of motion, difficulty with gait, muscle pain or joint pain and swelling.  Skin: Denies redness, rashes, lesions or ulcercations.  Neurological: Denies dizziness, difficulty with memory, difficulty with speech or problems with balance and coordination.  Psych: Denies anxiety, depression, SI/HI.  No other specific complaints in a complete review of systems (except as listed in HPI above).  Objective:   Physical Exam  BP 128/76   Pulse 85   Temp 98.1 F (36.7 C) (Oral)   Ht 4' 11.75" (1.518 m)   Wt 202 lb 4 oz (91.7 kg)   LMP 03/24/2018   SpO2 96%   BMI 39.83 kg/m  Wt Readings from Last 3 Encounters:  04/15/18 202 lb 4 oz (91.7 kg)  03/12/18 205 lb (93 kg)  01/02/18 198 lb (89.8 kg)    General: Appears her stated age, obese in NAD. Skin: Warm, dry  and intact. No  ulcerations noted. HEENT: Head: normal shape and size; Eyes: sclera white, no icterus, conjunctiva pink, PERRLA and EOMs intact; Ears: Tm's gray and intact, normal light reflex;  Throat/Mouth: Teeth present, mucosa pink and moist, no exudate, lesions or ulcerations noted.  Neck:  Neck supple, trachea midline. No masses, lumps or thyromegaly present.  Cardiovascular: Normal rate and rhythm. S1,S2 noted.  No murmur, rubs or gallops  noted. No JVD or BLE edema. No carotid bruits noted. Pulmonary/Chest: Normal effort and positive vesicular breath sounds. No respiratory distress. No wheezes, rales or ronchi noted.  Abdomen: Soft and nontender. Normal bowel sounds. No distention or masses noted. Liver, spleen and kidneys non palpable. Pelvic: Normal female anatomy. Cervix not visualized. No CMT. Adnexa non palpable. No discharge or odor present. Musculoskeletal: Strength 5/5 BUE/BLE. No difficulty with gait.  Neurological: Alert and oriented. Cranial nerves II-XII grossly intact. Coordination normal.  Psychiatric: Mood and affect normal. Behavior is normal. Judgment and thought content normal.     BMET    Component Value Date/Time   NA 136 01/02/2018 1523   K 4.0 01/02/2018 1523   CL 100 01/02/2018 1523   CO2 28 01/02/2018 1523   GLUCOSE 282 (H) 01/02/2018 1523   BUN 12 01/02/2018 1523   CREATININE 0.83 01/02/2018 1523   CALCIUM 9.7 01/02/2018 1523   GFRNONAA >90 02/21/2014 1510   GFRAA >90 02/21/2014 1510    Lipid Panel     Component Value Date/Time   CHOL 269 (H) 01/02/2018 1523   TRIG 154.0 (H) 01/02/2018 1523   HDL 66.90 01/02/2018 1523   CHOLHDL 4 01/02/2018 1523   VLDL 30.8 01/02/2018 1523   LDLCALC 172 (H) 01/02/2018 1523    CBC    Component Value Date/Time   WBC 8.0 01/02/2018 1523   RBC 4.19 01/02/2018 1523   HGB 13.0 01/02/2018 1523   HCT 38.1 01/02/2018 1523   PLT 271.0 01/02/2018 1523   MCV 90.8 01/02/2018 1523   MCH 30.3 10/22/2017 1116   MCHC  34.1 01/02/2018 1523   RDW 13.3 01/02/2018 1523   LYMPHSABS 3.0 10/07/2017 1603   MONOABS 0.5 10/07/2017 1603   EOSABS 0.3 10/07/2017 1603   BASOSABS 0.1 10/07/2017 1603    Hgb A1C Lab Results  Component Value Date   HGBA1C 8.5 (H) 01/02/2018            Assessment & Plan:   Preventative Health Maintenance:  Encouraged her to get a flu shot in the fall Tetanus UTD Pap smear today- she declines STD screening Mammogram ordered, she will call to schedule She declines referral to GI for screening colonoscopy Encouraged her to consume a balanced diet and exercise regimen Advised her to see an eye doctor and dentist annually  Will check CBC, CMET, Lipid, Vit D and A1C today  RTC in 3 moths, follow up DM 2 [name], NP

## 2018-04-17 ENCOUNTER — Ambulatory Visit (INDEPENDENT_AMBULATORY_CARE_PROVIDER_SITE_OTHER): Payer: BLUE CROSS/BLUE SHIELD | Admitting: Gastroenterology

## 2018-04-17 DIAGNOSIS — Z23 Encounter for immunization: Secondary | ICD-10-CM | POA: Diagnosis not present

## 2018-04-17 DIAGNOSIS — K760 Fatty (change of) liver, not elsewhere classified: Secondary | ICD-10-CM

## 2018-04-17 DIAGNOSIS — R748 Abnormal levels of other serum enzymes: Secondary | ICD-10-CM

## 2018-04-17 LAB — CYTOLOGY - PAP
Diagnosis: NEGATIVE
HPV: NOT DETECTED

## 2018-04-18 ENCOUNTER — Encounter: Payer: Self-pay | Admitting: Internal Medicine

## 2018-04-18 NOTE — Assessment & Plan Note (Signed)
Continue Urosidiol She will continue to follow with GI

## 2018-04-18 NOTE — Assessment & Plan Note (Signed)
A1C today No microalbumin secondary to ACEI therapy Encouraged her to consume a low carb diet and exercise for weight loss Continue Glipizide Foot exam today Pneumovax UTD Encouraged yearly eye exam

## 2018-04-18 NOTE — Assessment & Plan Note (Signed)
Controlled off meds CBC and CMET today

## 2018-04-18 NOTE — Assessment & Plan Note (Signed)
CMET and Lipid profile today Encouraged herto consume a low fat diet Continue Simvastatin for now, will adjust if needed based on labs 

## 2018-04-18 NOTE — Assessment & Plan Note (Signed)
Controlled on Lisinopril HCT CBC and CMET today Reinforced DASH diet and exercise for weight loss

## 2018-05-02 ENCOUNTER — Other Ambulatory Visit: Payer: Self-pay | Admitting: Internal Medicine

## 2018-05-06 ENCOUNTER — Other Ambulatory Visit: Payer: Self-pay | Admitting: Internal Medicine

## 2018-05-06 DIAGNOSIS — Z1239 Encounter for other screening for malignant neoplasm of breast: Secondary | ICD-10-CM

## 2018-05-07 MED ORDER — GLIPIZIDE ER 10 MG PO TB24: 10.0000 mg | ORAL_TABLET | Freq: Two times a day (BID) | ORAL | 3 refills | Status: DC

## 2018-05-21 ENCOUNTER — Ambulatory Visit
Admission: RE | Admit: 2018-05-21 | Discharge: 2018-05-21 | Disposition: A | Discharge status: Home / Self Care | Payer: BLUE CROSS/BLUE SHIELD | Source: Ambulatory Visit | Attending: Internal Medicine | Admitting: Internal Medicine

## 2018-05-21 DIAGNOSIS — Z1239 Encounter for other screening for malignant neoplasm of breast: Secondary | ICD-10-CM

## 2018-05-21 DIAGNOSIS — Z1231 Encounter for screening mammogram for malignant neoplasm of breast: Secondary | ICD-10-CM | POA: Diagnosis not present

## 2018-05-28 ENCOUNTER — Other Ambulatory Visit: Payer: Self-pay

## 2018-06-09 ENCOUNTER — Other Ambulatory Visit: Payer: Self-pay | Admitting: Internal Medicine

## 2018-06-17 ENCOUNTER — Ambulatory Visit: Payer: BLUE CROSS/BLUE SHIELD | Admitting: Gastroenterology

## 2018-06-17 ENCOUNTER — Encounter: Payer: Self-pay | Admitting: Gastroenterology

## 2018-06-17 ENCOUNTER — Ambulatory Visit (INDEPENDENT_AMBULATORY_CARE_PROVIDER_SITE_OTHER)
Admission: RE | Admit: 2018-06-17 | Discharge: 2018-06-17 | Disposition: A | Discharge status: Home / Self Care | Payer: BLUE CROSS/BLUE SHIELD | Source: Ambulatory Visit | Attending: Gastroenterology | Admitting: Gastroenterology

## 2018-06-17 VITALS — BP 134/92 | HR 80 | Ht 59.75 in | Wt 201.4 lb

## 2018-06-17 DIAGNOSIS — Z1211 Encounter for screening for malignant neoplasm of colon: Secondary | ICD-10-CM | POA: Diagnosis not present

## 2018-06-17 DIAGNOSIS — K7460 Unspecified cirrhosis of liver: Secondary | ICD-10-CM | POA: Diagnosis not present

## 2018-06-17 DIAGNOSIS — K743 Primary biliary cirrhosis: Secondary | ICD-10-CM | POA: Diagnosis not present

## 2018-06-17 MED ORDER — SUPREP BOWEL PREP KIT 17.5-3.13-1.6 GM/177ML PO SOLN: ORAL | 0 refills | Status: DC

## 2018-06-17 NOTE — Patient Instructions (Addendum)
If you are age 51 or older, your body mass index should be between 23-30. Your Body mass index is 39.66 kg/m. If this is out of the aforementioned range listed, please consider follow up with your Primary Care Provider.  If you are age 51 or younger, your body mass index should be between 19-25. Your Body mass index is 39.66 kg/m. If this is out of the aformentioned range listed, please consider follow up with your Primary Care Provider.   You have been scheduled for an endoscopy and colonoscopy. Please follow the written instructions given to you at your visit today. Please pick up your prep supplies at the pharmacy within the next 1-3 days. If you use inhalers (even only as needed), please bring them with you on the day of your procedure. Your physician has requested that you go to www.startemmi.com and enter the access code given to you at your visit today. This web site gives a general overview about your procedure. However, you should still follow specific instructions given to you by our office regarding your preparation for the procedure.   You have been scheduled for an abdominal ultrasound at [facility] Radiology (1st floor of hospital) on Friday, 06-20-18 at 9:00am. Please arrive 15 minutes prior to your appointment for registration. Make certain not to have anything to eat or drink 6 hours prior to your appointment. Should you need to reschedule your appointment, please contact radiology at [phone]. This test typically takes about 30 minutes.  You have been scheduled for a bone density test. Please go to Radiology in the basement floor of [place] location for this test. If you need to cancel or reschedule for any reason, please contact radiology at [phone].  We will request your labs from [facility] office.  Thank you for entrusting me with your care and for choosing [facility], Dr. Sneads Cellar

## 2018-06-17 NOTE — Progress Notes (Signed)
HPI :  52 year old female here for a follow-up visit. She was referred to [place] for abnormal liver enzymes in January of this year. She had a serologic workup done which showed ANA positive 1:160, SMA 35, and IgG 2094, AMA negative. Liver biopsy done 10/22/2017 - fibrotic changes noted 3-4/4 noted, AMA negative PBC versus PSC versus drug reaction were noted. MRCP 11/28/2017 - suggestion of cirrhosis, bile ducts normal, no evidence of PSC, suggestion of gastric varices. She was referred to see Hepatology, Dr. Zollie Scale / Corliss Skains - thought to have AMA negative PBC, started Ursodiol 541m BID. Her liver enzymes improved on this regimen (although per report, can't see labs from liver clinic). She is tolerating the ursodiol well.  She denies any abdominal pains. She is having some rare reflux which is usually related to diet, using Pepcid as needed which works well. No dysphagia. She denies any significant problems with her bowels. She has some rare scant bleeding at times if she is constipated, nothing significant. She denies any family history of colon cancer. She denies any family history of liver disease. She's never had a prior colonoscopy. She has not yet had an EGD for screening for varices. She has not had a DEXA scan for screening for osteoporosis. I believe she has had TSH checked with the liver clinic.  We immunized her to hepatitis A and B since her last visit.     Past Medical History:  Diagnosis Date  . Cirrhosis ([facility]   . Contact lens/glasses fitting    wears contacts or glasses  . Diabetes mellitus without complication ([facility]   . GERD (gastroesophageal reflux disease)    occ pepcid ac  . Hypertension   . Periumbilical hernia   . Primary biliary cholangitis ([facility])    AMA negative PBC  . Snores      Past Surgical History:  Procedure Laterality Date  . DILATION AND CURETTAGE OF UTERUS  11/1995, 10/1997  . TUBAL LIGATION  10/1997  . UMBILICAL HERNIA REPAIR N/A 08/11/2013   Procedure:  HERNIA REPAIR UMBILICAL ADULT;  Surgeon: JGwenyth Ober MD;  [facility]  Service: General;  Laterality: N/A;   Family History  Problem Relation Age of Onset  . Breast cancer Mother   . Schizophrenia Mother   . Depression Mother   . Renal Disease Father   . Diabetes Father   . Other Father        Agent Orange  . Lung cancer Maternal Grandmother   . Lung cancer Maternal Grandfather   . Heart disease Maternal Uncle   . Diabetes Other        Aunts and Uncles on both sides  . Heart disease Maternal Aunt    Social History   Tobacco Use  . Smoking status: Never Smoker  . Smokeless tobacco: Never Used  Substance Use Topics  . Alcohol use: Yes    Comment: wine 2 times a year  . Drug use: No   Current Outpatient Medications  Medication Sig Dispense Refill  . Blood Glucose Monitoring Suppl (ONE TOUCH ULTRA 2) w/Device KIT     . Cholecalciferol (VITAMIN D3) 50000 units CAPS 1 CAPSULE ORAL QWEEK (EVERY WEEK),X12 WEEK  0  . glipiZIDE (GLUCOTROL XL) 10 MG 24 hr tablet Take 1 tablet (10 mg total) by mouth 2 (two) times daily with a meal. 60 tablet 3  . lisinopril-hydrochlorothiazide (PRINZIDE,ZESTORETIC) 20-12.5 MG tablet TAKE 1 TABLET BY MOUTH EVERY DAY 30 tablet 0  .  Multiple Vitamins-Minerals (WOMENS MULTIVITAMIN PO) Take 2 tablets by mouth daily.    . ONE TOUCH ULTRA TEST test strip     . ONETOUCH DELICA LANCETS 84O MISC     . simvastatin (ZOCOR) 10 MG tablet TAKE 1 TABLET BY MOUTH EVERYDAY AT BEDTIME 30 tablet 1  . ursodiol (ACTIGALL) 250 MG tablet Take 250 mg by mouth 2 (two) times daily.  3  . SUPREP BOWEL PREP KIT 17.5-3.13-1.6 GM/177ML SOLN Suprep-Use as directed 354 mL 0   No current facility-administered medications for this visit.    Allergies  Allergen Reactions  . Grapefruit Concentrate Other (See Comments)    blisters  . Sulfa Antibiotics Rash and Other (See Comments)    Severe dehydration  . Mushroom Extract Complex   . Shellfish Allergy     . Strawberry Extract      Review of Systems: All systems reviewed and negative except where noted in HPI.   Lab Results  Component Value Date   WBC 8.0 01/02/2018   HGB 13.0 01/02/2018   HCT 38.1 01/02/2018   MCV 90.8 01/02/2018   PLT 271.0 01/02/2018    Lab Results  Component Value Date   CREATININE 0.83 01/02/2018   BUN 12 01/02/2018   NA 136 01/02/2018   K 4.0 01/02/2018   CL 100 01/02/2018   CO2 28 01/02/2018    Lab Results  Component Value Date   ALT 23 01/02/2018   AST 28 01/02/2018   ALKPHOS 167 (H) 01/02/2018   BILITOT 0.6 01/02/2018    Lab Results  Component Value Date   INR 0.97 10/22/2017   INR 1.1 (H) 10/07/2017      Physical Exam: BP (!) 134/92   Pulse 80   Ht 4' 11.75" (1.518 m)   Wt 201 lb 6 oz (91.3 kg)   BMI 39.66 kg/m  Constitutional: Pleasant,well-developed, female in no acute distress. HEENT: Normocephalic and atraumatic. Conjunctivae are normal. No scleral icterus. Neck supple.  Cardiovascular: Normal rate, regular rhythm.  Pulmonary/chest: Effort normal and breath sounds normal. No wheezing, rales or rhonchi. Abdominal: Soft, nondistended, nontender. There are no masses palpable. No hepatomegaly. Extremities: no edema Lymphadenopathy: No cervical adenopathy noted. Neurological: Alert and oriented to person place and time. No asterixis. Skin: Skin is warm and dry. No rashes noted. Psychiatric: Normal mood and affect. Behavior is normal.   ASSESSMENT AND PLAN: 52 year old female here for reassessment of the following issues:  AMA negative PBC / cirrhosis - given liver biopsy and lab findings, her workup has revealed a diagnosis of AMA negative PBC along with compensated cirrhosis. She's been placed on ursodiol and appears to be tolerating it well, her labs have reportedly improved, I will reach out to [facility] office to get a copy of her most recent lab work. We discussed PBC and cirrhosis in general, long-term risks for  decompensation and HCC. Generally she is done quite well on the regimen so far and improvement of her liver function testing is encouraging. Otherwise we discussed options for colon cancer screening, optical colonoscopy is recommended.  I'm recommending the following at this time:  - get most recent lab work from Hepatology office, ensure up to date, check LFTs q 6 months or so - continue Ursodiol indefinitely - upper endoscopy to screen for esophageal/gastric varices. - DEXA scan to screen for osteoporosis, will refer for this - ultrasound of the right upper quadrant for [place] screening, will schedule this, to be done every 6 months - screening colonoscopy  I discussed the risks and benefits of endoscopy and anesthesia with the patient. Following this discussion she wanted to proceed. Further recommendations pending the results  Finlayson Cellar, MD [facility]

## 2018-06-18 ENCOUNTER — Telehealth: Payer: Self-pay | Admitting: Gastroenterology

## 2018-06-18 ENCOUNTER — Other Ambulatory Visit: Payer: Self-pay

## 2018-06-18 DIAGNOSIS — Z1211 Encounter for screening for malignant neoplasm of colon: Secondary | ICD-10-CM | POA: Diagnosis not present

## 2018-06-18 DIAGNOSIS — K743 Primary biliary cirrhosis: Secondary | ICD-10-CM | POA: Diagnosis not present

## 2018-06-18 DIAGNOSIS — R748 Abnormal levels of other serum enzymes: Secondary | ICD-10-CM

## 2018-06-18 DIAGNOSIS — K7460 Unspecified cirrhosis of liver: Secondary | ICD-10-CM | POA: Diagnosis not present

## 2018-06-18 NOTE — Progress Notes (Signed)
Repeats labs due 08-2018 (CMET)

## 2018-06-18 NOTE — Telephone Encounter (Signed)
Records received from Hepatology:  Labs 03/19/18:  - T bil 0.7, AP 176, AST 43, ALT 55  Labs 12/18/17: - T bil 0.6, AP 351, AST 82, ALT 122   Improved while on Ursodiol, recommend repeat CMET in December. Jan can you please place recall for labs for this patient? Thanks

## 2018-06-20 ENCOUNTER — Ambulatory Visit (HOSPITAL_COMMUNITY)
Admission: RE | Admit: 2018-06-20 | Discharge: 2018-06-20 | Disposition: A | Discharge status: Home / Self Care | Payer: BLUE CROSS/BLUE SHIELD | Source: Ambulatory Visit | Attending: Gastroenterology | Admitting: Gastroenterology

## 2018-06-20 DIAGNOSIS — K743 Primary biliary cirrhosis: Secondary | ICD-10-CM

## 2018-06-20 DIAGNOSIS — K7460 Unspecified cirrhosis of liver: Secondary | ICD-10-CM | POA: Diagnosis not present

## 2018-06-20 DIAGNOSIS — Z1211 Encounter for screening for malignant neoplasm of colon: Secondary | ICD-10-CM | POA: Diagnosis not present

## 2018-06-23 DIAGNOSIS — K743 Primary biliary cirrhosis: Secondary | ICD-10-CM | POA: Diagnosis not present

## 2018-07-08 ENCOUNTER — Other Ambulatory Visit: Payer: Self-pay

## 2018-07-08 MED ORDER — SIMVASTATIN 10 MG PO TABS: ORAL_TABLET | ORAL | 2 refills | Status: DC

## 2018-07-08 MED ORDER — GLIPIZIDE ER 10 MG PO TB24: 10.0000 mg | ORAL_TABLET | Freq: Two times a day (BID) | ORAL | 0 refills | Status: DC

## 2018-07-08 MED ORDER — LISINOPRIL-HYDROCHLOROTHIAZIDE 20-12.5 MG PO TABS: 1.0000 | ORAL_TABLET | Freq: Every day | ORAL | 2 refills | Status: DC

## 2018-07-10 ENCOUNTER — Other Ambulatory Visit: Payer: Self-pay | Admitting: Internal Medicine

## 2018-07-17 ENCOUNTER — Encounter: Payer: Self-pay | Admitting: Internal Medicine

## 2018-07-17 ENCOUNTER — Ambulatory Visit: Payer: BLUE CROSS/BLUE SHIELD | Admitting: Internal Medicine

## 2018-07-17 VITALS — BP 128/82 | HR 65 | Temp 98.0°F | Wt 203.0 lb

## 2018-07-17 DIAGNOSIS — E1165 Type 2 diabetes mellitus with hyperglycemia: Secondary | ICD-10-CM | POA: Diagnosis not present

## 2018-07-17 DIAGNOSIS — E782 Mixed hyperlipidemia: Secondary | ICD-10-CM | POA: Diagnosis not present

## 2018-07-17 DIAGNOSIS — E119 Type 2 diabetes mellitus without complications: Secondary | ICD-10-CM

## 2018-07-17 DIAGNOSIS — Z23 Encounter for immunization: Secondary | ICD-10-CM

## 2018-07-17 LAB — LIPID PANEL
Cholesterol: 274 mg/dL — ABNORMAL HIGH (ref 0–200)
HDL: 79.8 mg/dL (ref >39.00)
LDL Cholesterol: 174 mg/dL — ABNORMAL HIGH (ref 0–99)
NonHDL: 193.72
Total CHOL/HDL Ratio: 3
Triglycerides: 100 mg/dL (ref 0.0–149.0)
VLDL: 20 mg/dL (ref 0.0–40.0)

## 2018-07-17 LAB — COMPREHENSIVE METABOLIC PANEL
ALT: 29 U/L (ref 0–35)
AST: 28 U/L (ref 0–37)
Albumin: 3.8 g/dL (ref 3.5–5.2)
Alkaline Phosphatase: 140 U/L — ABNORMAL HIGH (ref 39–117)
BUN: 14 mg/dL (ref 6–23)
CO2: 29 mEq/L (ref 19–32)
Calcium: 9.8 mg/dL (ref 8.4–10.5)
Chloride: 100 mEq/L (ref 96–112)
Creatinine, Ser: 0.88 mg/dL (ref 0.40–1.20)
GFR: 86.85 mL/min (ref >60.00)
Glucose, Bld: 176 mg/dL — ABNORMAL HIGH (ref 70–99)
Potassium: 4 mEq/L (ref 3.5–5.1)
Sodium: 135 mEq/L (ref 135–145)
Total Bilirubin: 0.6 mg/dL (ref 0.2–1.2)
Total Protein: 8.1 g/dL (ref 6.0–8.3)

## 2018-07-17 LAB — HEMOGLOBIN A1C: Hgb A1c MFr Bld: 8.4 % — ABNORMAL HIGH (ref 4.6–6.5)

## 2018-07-17 NOTE — Assessment & Plan Note (Signed)
A1C today No microalbumin secondary to ACEI therapy Encouraged her to consume a low carb, low saturated fat diet and exercise for weight loss Continue Glipizide for now Encouraged yearly eye exams Foot exam today Flu shot today Pneumovax UTD

## 2018-07-17 NOTE — Progress Notes (Signed)
Subjective:    Patient ID: Susan [name], female    DOB: Sep 04, 1966, 52 y.o.   MRN: [id number]  HPI  Pt presents to the clinic today to follow up of HLD and DM 2.  HLD: Her last LDL was 172, 12/2017. She was started on Simvastatin at that time. She has been taking the medication as prescribed (although she has not taken in 2 weeks because she had to transition to mail order) and denies myalgias but reports she has noticed some joint pains. She has been trying to consume a low fat diet.  DM 2: Her last A1C was 8.5%. Her Glipizide was increased to 10 mg BID. She has been taking the medication as prescribed. She has not been checking her sugars, but denies feelings of hypoglycemia. She checks her feet routinely. Her last eye exam was 08/2017. Flu 07/2017. Pneumovax 10/2016.  Review of Systems  Past Medical History:  Diagnosis Date  . Cirrhosis ([place])   . Contact lens/glasses fitting    wears contacts or glasses  . Diabetes mellitus without complication ([place])   . GERD (gastroesophageal reflux disease)    occ pepcid ac  . Hypertension   . Periumbilical hernia   . Primary biliary cholangitis ([facility])    AMA negative PBC  . Snores     Current Outpatient Medications  Medication Sig Dispense Refill  . Blood Glucose Monitoring Suppl (ONE TOUCH ULTRA 2) w/Device KIT     . glipiZIDE (GLUCOTROL XL) 10 MG 24 hr tablet Take 1 tablet (10 mg total) by mouth 2 (two) times daily with a meal. 180 tablet 0  . lisinopril-hydrochlorothiazide (PRINZIDE,ZESTORETIC) 20-12.5 MG tablet Take 1 tablet by mouth daily. 90 tablet 2  . Multiple Vitamins-Minerals (WOMENS MULTIVITAMIN PO) Take 2 tablets by mouth daily.    . ONE TOUCH ULTRA TEST test strip     . ONETOUCH DELICA LANCETS 35T MISC     . simvastatin (ZOCOR) 10 MG tablet TAKE 1 TABLET BY MOUTH EVERYDAY AT BEDTIME 90 tablet 2  . SUPREP BOWEL PREP KIT 17.5-3.13-1.6 GM/177ML SOLN Suprep-Use as directed 354 mL 0  . ursodiol (ACTIGALL) 250 MG tablet Take 250  mg by mouth 2 (two) times daily.  3   No current facility-administered medications for this visit.     Allergies  Allergen Reactions  . Grapefruit Concentrate Other (See Comments)    blisters  . Sulfa Antibiotics Rash and Other (See Comments)    Severe dehydration  . Mushroom Extract Complex   . Shellfish Allergy   . Strawberry Extract     Family History  Problem Relation Age of Onset  . Breast cancer Mother   . Schizophrenia Mother   . Depression Mother   . Renal Disease Father   . Diabetes Father   . Other Father        Agent Orange  . Lung cancer Maternal Grandmother   . Lung cancer Maternal Grandfather   . Heart disease Maternal Uncle   . Diabetes Other        Aunts and Uncles on both sides  . Heart disease Maternal Aunt     Social History   Socioeconomic History  . Marital status: Married    Spouse name: Not on file  . Number of children: 1  . Years of education: Not on file  . Highest education level: Not on file  Occupational History  . Occupation: Medical illustrator  Social Needs  . Financial resource strain: Not on file  .  Food insecurity:    Worry: Not on file    Inability: Not on file  . Transportation needs:    Medical: Not on file    Non-medical: Not on file  Tobacco Use  . Smoking status: Never Smoker  . Smokeless tobacco: Never Used  Substance and Sexual Activity  . Alcohol use: Yes    Comment: wine 2 times a year  . Drug use: No  . Sexual activity: Yes  Lifestyle  . Physical activity:    Days per week: Not on file    Minutes per session: Not on file  . Stress: Not on file  Relationships  . Social connections:    Talks on phone: Not on file    Gets together: Not on file    Attends religious service: Not on file    Active member of club or organization: Not on file    Attends meetings of clubs or organizations: Not on file    Relationship status: Not on file  . Intimate partner violence:    Fear of current or ex partner: Not on file     Emotionally abused: Not on file    Physically abused: Not on file    Forced sexual activity: Not on file  Other Topics Concern  . Not on file  Social History Narrative  . Not on file     Constitutional: Denies fever, malaise, fatigue, headache or abrupt weight changes.  Respiratory: Denies difficulty breathing, shortness of breath, cough or sputum production.   Cardiovascular: Denies chest pain, chest tightness, palpitations or swelling in the hands or feet.  Musculoskeletal: Pt reports intermittent joint pains. Denies decrease in range of motion, difficulty with gait, muscle pain or joint swelling.  Skin: Denies redness, rashes, lesions or ulcercations.  Neurological: Denies dizziness, difficulty with memory, difficulty with speech or problems with balance and coordination.    No other specific complaints in a complete review of systems (except as listed in HPI above).     Objective:   Physical Exam   BP 128/82   Pulse 65   Temp 98 F (36.7 C) (Oral)   Wt 203 lb (92.1 kg)   SpO2 98%   BMI 39.98 kg/m  Wt Readings from Last 3 Encounters:  07/17/18 203 lb (92.1 kg)  06/17/18 201 lb 6 oz (91.3 kg)  04/15/18 202 lb 4 oz (91.7 kg)    General: Appears her stated age, obese, in NAD. Skin: Warm, dry and intact. No ulcerations noted. Cardiovascular: Normal rate and rhythm. S1,S2 noted.  No murmur, rubs or gallops noted. No JVD or BLE edema. No carotid bruits noted. Pulmonary/Chest: Normal effort and positive vesicular breath sounds. No respiratory distress. No wheezes, rales or ronchi noted.  Neurological: Alert and oriented. Sensation intact to BLE.   BMET    Component Value Date/Time   NA 136 01/02/2018 1523   K 4.0 01/02/2018 1523   CL 100 01/02/2018 1523   CO2 28 01/02/2018 1523   GLUCOSE 282 (H) 01/02/2018 1523   BUN 12 01/02/2018 1523   CREATININE 0.83 01/02/2018 1523   CALCIUM 9.7 01/02/2018 1523   GFRNONAA >90 02/21/2014 1510   GFRAA >90 02/21/2014 1510     Lipid Panel     Component Value Date/Time   CHOL 269 (H) 01/02/2018 1523   TRIG 154.0 (H) 01/02/2018 1523   HDL 66.90 01/02/2018 1523   CHOLHDL 4 01/02/2018 1523   VLDL 30.8 01/02/2018 1523   LDLCALC 172 (H) 01/02/2018 1523  CBC    Component Value Date/Time   WBC 8.0 01/02/2018 1523   RBC 4.19 01/02/2018 1523   HGB 13.0 01/02/2018 1523   HCT 38.1 01/02/2018 1523   PLT 271.0 01/02/2018 1523   MCV 90.8 01/02/2018 1523   MCH 30.3 10/22/2017 1116   MCHC 34.1 01/02/2018 1523   RDW 13.3 01/02/2018 1523   LYMPHSABS 3.0 10/07/2017 1603   MONOABS 0.5 10/07/2017 1603   EOSABS 0.3 10/07/2017 1603   BASOSABS 0.1 10/07/2017 1603    Hgb A1C Lab Results  Component Value Date   HGBA1C 8.5 (H) 01/02/2018           Assessment & Plan:

## 2018-07-17 NOTE — Patient Instructions (Signed)
Fat and Cholesterol Restricted Diet Getting too much fat and cholesterol in your diet may cause health problems. Following this diet helps keep your fat and cholesterol at normal levels. This can keep you from getting sick. What types of fat should I choose?  Choose monosaturated and polyunsaturated fats. These are found in foods such as olive oil, canola oil, flaxseeds, walnuts, almonds, and seeds.  Eat more omega-3 fats. Good choices include salmon, mackerel, sardines, tuna, flaxseed oil, and ground flaxseeds.  Limit saturated fats. These are in animal products such as meats, butter, and cream. They can also be in plant products such as palm oil, palm kernel oil, and coconut oil.  Avoid foods with partially hydrogenated oils in them. These contain trans fats. Examples of foods that have trans fats are stick margarine, some tub margarines, cookies, crackers, and other baked goods. What general guidelines do I need to follow?  Check food labels. Look for the words "trans fat" and "saturated fat."  When preparing a meal: ? Fill half of your plate with vegetables and green salads. ? Fill one fourth of your plate with whole grains. Look for the word "whole" as the first word in the ingredient list. ? Fill one fourth of your plate with lean protein foods.  Eat more foods that have fiber, like apples, carrots, beans, peas, and barley.  Eat more home-cooked foods. Eat less at restaurants and buffets.  Limit or avoid alcohol.  Limit foods high in starch and sugar.  Limit fried foods.  Cook foods without frying them. Baking, boiling, grilling, and broiling are all great options.  Lose weight if you are overweight. Losing even a small amount of weight can help your overall health. It can also help prevent diseases such as diabetes and heart disease. What foods can I eat? Grains Whole grains, such as whole wheat or whole grain breads, crackers, cereals, and pasta. Unsweetened oatmeal,  bulgur, barley, quinoa, or brown rice. Corn or whole wheat flour tortillas. Vegetables Fresh or frozen vegetables (raw, steamed, roasted, or grilled). Green salads. Fruits All fresh, canned (in natural juice), or frozen fruits. Meat and Other Protein Products Ground beef (85% or leaner), grass-fed beef, or beef trimmed of fat. Skinless chicken or turkey. Ground chicken or turkey. Pork trimmed of fat. All fish and seafood. Eggs. Dried beans, peas, or lentils. Unsalted nuts or seeds. Unsalted canned or dry beans. Dairy Low-fat dairy products, such as skim or 1% milk, 2% or reduced-fat cheeses, low-fat ricotta or cottage cheese, or plain low-fat yogurt. Fats and Oils Tub margarines without trans fats. Light or reduced-fat mayonnaise and salad dressings. Avocado. Olive, canola, sesame, or safflower oils. Natural peanut or almond butter (choose ones without added sugar and oil). The items listed above may not be a complete list of recommended foods or beverages. Contact your dietitian for more options. What foods are not recommended? Grains White bread. White pasta. White rice. Cornbread. Bagels, pastries, and croissants. Crackers that contain trans fat. Vegetables White potatoes. Corn. Creamed or fried vegetables. Vegetables in a cheese sauce. Fruits Dried fruits. Canned fruit in light or heavy syrup. Fruit juice. Meat and Other Protein Products Fatty cuts of meat. Ribs, chicken wings, bacon, sausage, bologna, salami, chitterlings, fatback, hot dogs, bratwurst, and packaged luncheon meats. Liver and organ meats. Dairy Whole or 2% milk, cream, half-and-half, and cream cheese. Whole milk cheeses. Whole-fat or sweetened yogurt. Full-fat cheeses. Nondairy creamers and whipped toppings. Processed cheese, cheese spreads, or cheese curds. Sweets and Desserts Corn   syrup, sugars, honey, and molasses. Candy. Jam and jelly. Syrup. Sweetened cereals. Cookies, pies, cakes, donuts, muffins, and ice  cream. Fats and Oils Butter, stick margarine, lard, shortening, ghee, or bacon fat. Coconut, palm kernel, or palm oils. Beverages Alcohol. Sweetened drinks (such as sodas, lemonade, and fruit drinks or punches). The items listed above may not be a complete list of foods and beverages to avoid. Contact your dietitian for more information. This information is not intended to replace advice given to you by your health care provider. Make sure you discuss any questions you have with your health care provider. Document Released: 03/11/2012 Document Revised: 05/17/2016 Document Reviewed: 12/10/2013 Elsevier Interactive Patient Education  2018 Elsevier Inc.  

## 2018-07-17 NOTE — Assessment & Plan Note (Signed)
CMET and Lipid profile today I don't think her joint pains are r/t Simvastatin Encouraged her to consume a low carb, low saturated fat diet Continue Simvastatin for now, will adjust if needed based on labs

## 2018-07-23 ENCOUNTER — Telehealth: Payer: Self-pay

## 2018-07-23 NOTE — Telephone Encounter (Signed)
Left detailed msg on VM per HIPAA  A1C is unchanged. We would like to know if pt is ok to add Januvia 50 mg daily for diabetes.She needs to restart cholesterol medicine. Schedule follow up with in 3 months.

## 2018-07-31 ENCOUNTER — Encounter: Payer: Self-pay | Admitting: Gastroenterology

## 2018-07-31 ENCOUNTER — Ambulatory Visit (AMBULATORY_SURGERY_CENTER): Payer: BLUE CROSS/BLUE SHIELD | Admitting: Gastroenterology

## 2018-07-31 VITALS — BP 143/76 | HR 94 | Temp 98.9°F | Resp 16 | Ht 59.75 in | Wt 203.0 lb

## 2018-07-31 DIAGNOSIS — K5140 Inflammatory polyps of colon without complications: Secondary | ICD-10-CM

## 2018-07-31 DIAGNOSIS — Z1211 Encounter for screening for malignant neoplasm of colon: Secondary | ICD-10-CM

## 2018-07-31 DIAGNOSIS — K743 Primary biliary cirrhosis: Secondary | ICD-10-CM

## 2018-07-31 DIAGNOSIS — D120 Benign neoplasm of cecum: Secondary | ICD-10-CM

## 2018-07-31 DIAGNOSIS — K7460 Unspecified cirrhosis of liver: Secondary | ICD-10-CM | POA: Diagnosis not present

## 2018-07-31 MED ORDER — SODIUM CHLORIDE 0.9 % IV SOLN: 500.0000 mL | Freq: Once | INTRAVENOUS | Status: DC

## 2018-07-31 NOTE — Patient Instructions (Signed)
Handouts given on polyps and hemorrhoids.   YOU HAD AN ENDOSCOPIC PROCEDURE TODAY AT [facility]:   Refer to the procedure report that was given to you for any specific questions about what was found during the examination.  If the procedure report does not answer your questions, please call your gastroenterologist to clarify.  If you requested that your care partner not be given the details of your procedure findings, then the procedure report has been included in a sealed envelope for you to review at your convenience later.  YOU SHOULD EXPECT: Some feelings of bloating in the abdomen. Passage of more gas than usual.  Walking can help get rid of the air that was put into your GI tract during the procedure and reduce the bloating. If you had a lower endoscopy (such as a colonoscopy or flexible sigmoidoscopy) you may notice spotting of blood in your stool or on the toilet paper. If you underwent a bowel prep for your procedure, you may not have a normal bowel movement for a few days.  Please Note:  You might notice some irritation and congestion in your nose or some drainage.  This is from the oxygen used during your procedure.  There is no need for concern and it should clear up in a day or so.  SYMPTOMS TO REPORT IMMEDIATELY:   Following lower endoscopy (colonoscopy or flexible sigmoidoscopy):  Excessive amounts of blood in the stool  Significant tenderness or worsening of abdominal pains  Swelling of the abdomen that is new, acute  Fever of 100F or higher   Following upper endoscopy (EGD)  Vomiting of blood or coffee ground material  New chest pain or pain under the shoulder blades  Painful or persistently difficult swallowing  New shortness of breath  Fever of 100F or higher  Black, tarry-looking stools  For urgent or emergent issues, a gastroenterologist can be reached at any hour by calling [phone].   DIET:  We do recommend a small meal at first, but then  you may proceed to your regular diet.  Drink plenty of fluids but you should avoid alcoholic beverages for 24 hours.  ACTIVITY:  You should plan to take it easy for the rest of today and you should NOT DRIVE or use heavy machinery until tomorrow (because of the sedation medicines used during the test).    FOLLOW UP: Our staff will call the number listed on your records the next business day following your procedure to check on you and address any questions or concerns that you may have regarding the information given to you following your procedure. If we do not reach you, we will leave a message.  However, if you are feeling well and you are not experiencing any problems, there is no need to return our call.  We will assume that you have returned to your regular daily activities without incident.  If any biopsies were taken you will be contacted by phone or by letter within the next 1-3 weeks.  Please call us at [phone] if you have not heard about the biopsies in 3 weeks.    SIGNATURES/CONFIDENTIALITY: You and/or your care partner have signed paperwork which will be entered into your electronic medical record.  These signatures attest to the fact that that the information above on your After Visit Summary has been reviewed and is understood.  Full responsibility of the confidentiality of this discharge information lies with you and/or your care-partner.

## 2018-07-31 NOTE — Op Note (Signed)
[place] Patient Name: Susan [name] Procedure Date: 07/31/2018 8:22 AM MRN: [id number] Endoscopist: Remo Lipps P. Havery Moros , MD Age: 52 Referring MD:  Date of Birth: 1966/02/14 Gender: Female Account #: [id number] Procedure:                Colonoscopy Indications:              Screening for colorectal malignant neoplasm, This                            is the patient's first colonoscopy Medicines:                Monitored Anesthesia Care Procedure:                Pre-Anesthesia Assessment:                           - Prior to the procedure, a History and Physical                            was performed, and patient medications and                            allergies were reviewed. The patient's tolerance of                            previous anesthesia was also reviewed. The risks                            and benefits of the procedure and the sedation                            options and risks were discussed with the patient.                            All questions were answered, and informed consent                            was obtained. Prior Anticoagulants: The patient has                            taken no previous anticoagulant or antiplatelet                            agents. ASA Grade Assessment: II - A patient with                            mild systemic disease. After reviewing the risks                            and benefits, the patient was deemed in                            satisfactory condition to undergo the procedure.  After obtaining informed consent, the colonoscope                            was passed under direct vision. Throughout the                            procedure, the patient's blood pressure, pulse, and                            oxygen saturations were monitored continuously. The                            Model PCF-[id number]) scope was introduced                            through the anus  and advanced to the the cecum,                            identified by appendiceal orifice and ileocecal                            valve. The colonoscopy was performed without                            difficulty. The patient tolerated the procedure                            well. The quality of the bowel preparation was                            good. The ileocecal valve, appendiceal orifice, and                            rectum were photographed. Scope In: 8:42:05 AM Scope Out: 8:58:35 AM Scope Withdrawal Time: 0 hours 13 minutes 21 seconds  Total Procedure Duration: 0 hours 16 minutes 30 seconds  Findings:                 The perianal and digital rectal examinations were                            normal.                           A 3 mm polyp was found in the cecum. The polyp was                            sessile. The polyp was removed with a cold snare.                            Resection and retrieval were complete.                           A diminutive polyp was found in the cecum. The  polyp was sessile. It was not able to be grasped                            with the snare. The polyp was removed with a cold                            biopsy forceps. Resection and retrieval were                            complete.                           Anal papilla(e) were hypertrophied.                           Internal hemorrhoids were found during                            retroflexion, with a suspected rectal varix.                           There were prominent lymphoid aggregates in the                            right colon. The exam was otherwise without                            abnormality. Complications:            No immediate complications. Estimated blood loss:                            Minimal. Estimated Blood Loss:     Estimated blood loss was minimal. Impression:               - One 3 mm polyp in the cecum, removed with a cold                             snare. Resected and retrieved.                           - One diminutive polyp in the cecum, removed with a                            cold biopsy forceps. Resected and retrieved.                           - Anal papilla(e) were hypertrophied.                           - Internal hemorrhoids.                           - The examination was otherwise normal. Recommendation:           - Patient has a contact number available for  emergencies. The signs and symptoms of potential                            delayed complications were discussed with the                            patient. Return to normal activities tomorrow.                            Written discharge instructions were provided to the                            patient.                           - Resume previous diet.                           - Continue present medications.                           - Await pathology results. Remo Lipps P. Sharay Bellissimo, MD 07/31/2018 9:03:08 AM This report has been signed electronically.

## 2018-07-31 NOTE — Progress Notes (Signed)
Called to room to assist during endoscopic procedure.  Patient ID and intended procedure confirmed with present staff. Received instructions for my participation in the procedure from the performing physician.  

## 2018-07-31 NOTE — Progress Notes (Signed)
Report given to PACU, vss 

## 2018-07-31 NOTE — Op Note (Signed)
[facility] Patient Name: Susan [name] Procedure Date: 07/31/2018 8:23 AM MRN: [id number] Endoscopist: Remo Lipps P. Havery Moros , MD Age: 52 Referring MD:  Date of Birth: March 19, 1966 Gender: Female Account #: [id number] Procedure:                Upper GI endoscopy Indications:              Cirrhosis rule out esophageal varices (history of                            PBC with compensated cirrhosis) Medicines:                Monitored Anesthesia Care Procedure:                Pre-Anesthesia Assessment:                           - Prior to the procedure, a History and Physical                            was performed, and patient medications and                            allergies were reviewed. The patient's tolerance of                            previous anesthesia was also reviewed. The risks                            and benefits of the procedure and the sedation                            options and risks were discussed with the patient.                            All questions were answered, and informed consent                            was obtained. Prior Anticoagulants: The patient has                            taken no previous anticoagulant or antiplatelet                            agents. ASA Grade Assessment: II - A patient with                            mild systemic disease. After reviewing the risks                            and benefits, the patient was deemed in                            satisfactory condition to undergo the procedure.  After obtaining informed consent, the endoscope was                            passed under direct vision. Throughout the                            procedure, the patient's blood pressure, pulse, and                            oxygen saturations were monitored continuously. The                            Model GIF-[id number]) scope was introduced                            through the  mouth, and advanced to the second part                            of duodenum. The upper GI endoscopy was                            accomplished without difficulty. The patient                            tolerated the procedure well. Scope In: Scope Out: Findings:                 Esophagogastric landmarks were identified: the                            Z-line was found at 37 cm, the gastroesophageal                            junction was found at 37 cm and the upper extent of                            the gastric folds was found at 37 cm from the                            incisors.                           The exam of the esophagus was otherwise normal. No                            esophageal varices.                           Possible type 1 isolated gastric varices (IGV1,                            varices located in the fundus) were found in the                            cardia /  fundus. The fundus was angulated. Despite                            the endoscope maximally retroflexed in multiple                            angles, the entire cardia could not be well                            visualized.                           The exam of the stomach was otherwise normal.                           The duodenal bulb and second portion of the                            duodenum were normal. Complications:            No immediate complications. Estimated blood loss:                            None. Estimated Blood Loss:     Estimated blood loss: none. Impression:               - Esophagogastric landmarks identified.                           - No esophageal varices                           - Possible type 1 isolated gastric varices (IGV1,                            varices located in the fundus) - views of the                            cardia limited due to angulated proximal stomach -                            unclear if varices are truly present versus                             prominent soft tissue / folds.                           - Normal duodenal bulb and second portion of the                            duodenum. Recommendation:           - Patient has a contact number available for                            emergencies. The signs and symptoms of potential  delayed complications were discussed with the                            patient. Return to normal activities tomorrow.                            Written discharge instructions were provided to the                            patient.                           - Resume previous diet.                           - Continue present medications.                           - Prior MRI showed no gastric varices or                            splenomegaly. Will discuss with advanced endoscopy                            regarding possible EUS to confirm if gastric                            varices are present Remo Lipps P. Armbruster, MD 07/31/2018 9:11:19 AM This report has been signed electronically.

## 2018-08-01 ENCOUNTER — Telehealth: Payer: Self-pay | Admitting: *Deleted

## 2018-08-01 ENCOUNTER — Telehealth: Payer: Self-pay

## 2018-08-01 NOTE — Telephone Encounter (Signed)
  Follow up Call-  Call back number 07/31/2018  Post procedure Call Back phone  # [phone]  Permission to leave phone message Yes  Some recent data might be hidden     Patient questions:  Do you have a fever, pain , or abdominal swelling? No. Pain Score  0 *  Have you tolerated food without any problems? Yes.    Have you been able to return to your normal activities? Yes.    Do you have any questions about your discharge instructions: Diet   No. Medications  No. Follow up visit  No.  Do you have questions or concerns about your Care? No.  Actions: * If pain score is 4 or above: No action needed, pain <4.

## 2018-08-01 NOTE — Telephone Encounter (Signed)
  Follow up Call-  Call back number 07/31/2018  Post procedure Call Back phone  # [phone]  Permission to leave phone message Yes  Some recent data might be hidden     Left message on voicemail

## 2018-08-08 MED ORDER — SITAGLIPTIN PHOSPHATE 50 MG PO TABS: 50.0000 mg | ORAL_TABLET | Freq: Every day | ORAL | 0 refills | Status: DC

## 2018-08-08 NOTE — Addendum Note (Signed)
Addended by: Lurlean Nanny on: 08/08/2018 11:21 AM   Modules accepted: Orders

## 2018-08-14 ENCOUNTER — Telehealth: Payer: Self-pay | Admitting: Gastroenterology

## 2018-08-14 ENCOUNTER — Encounter: Payer: Self-pay | Admitting: Internal Medicine

## 2018-08-14 DIAGNOSIS — I864 Gastric varices: Secondary | ICD-10-CM

## 2018-08-14 MED ORDER — NADOLOL 40 MG PO TABS: 40.0000 mg | ORAL_TABLET | Freq: Every day | ORAL | 3 refills | Status: DC

## 2018-08-14 NOTE — Telephone Encounter (Signed)
Called patient. She had small isolated gastric varices noted on EGD. These were also seen on MRI earlier in the year. We discussed options - namely if she wanted to try a beta blocker for preventative purposes versus close monitoring. For prevention for first variceal hemorrhage, guidelines state that NSBB can be used although the evidence is not as strong as for esophageal varices. We discussed whether or not she wished to try nadolol or propranolol versus close surveillance. I have spoken with her PCP [name] - if she wishes to start one of these we may dose reduce her other BP med to ensure she tolerates it. The patient did want to try nadolol 40mg  once daily after discussion of options. Will prescribe this for her. She may wish to cut her lisinopril / HCTZ dose in half when she starts this to ensure she tolerates it. She will follow up with her PCP For BP check in a few weeks. I will see her back in March or so for routine office follow up. She will contact me in the interim with any questions.

## 2018-09-01 ENCOUNTER — Ambulatory Visit: Payer: BLUE CROSS/BLUE SHIELD | Admitting: Internal Medicine

## 2018-09-01 ENCOUNTER — Encounter: Payer: Self-pay | Admitting: Internal Medicine

## 2018-09-01 DIAGNOSIS — E119 Type 2 diabetes mellitus without complications: Secondary | ICD-10-CM

## 2018-09-01 DIAGNOSIS — I10 Essential (primary) hypertension: Secondary | ICD-10-CM | POA: Diagnosis not present

## 2018-09-01 MED ORDER — LISINOPRIL 10 MG PO TABS: 10.0000 mg | ORAL_TABLET | Freq: Every day | ORAL | 2 refills | Status: DC

## 2018-09-01 MED ORDER — HYDROCHLOROTHIAZIDE 25 MG PO TABS: 25.0000 mg | ORAL_TABLET | Freq: Every day | ORAL | 2 refills | Status: DC

## 2018-09-01 NOTE — Progress Notes (Signed)
Subjective:    Patient ID: Susan [name], female    DOB: 01-29-1966, 52 y.o.   MRN: [id number]  HPI  Pt presents to the clinic today for blood pressure follow up. She was started on Nadolol by Dr. Enis Gash for esophageal varices. She has been taking this as night as prescribed. We cute her Lisinopril HCT in half to 10-12.5 mg. Over the last 2 weeks, she has been experiencing lower extremity swelling, shortness of breath, especially when walking up the stairs. She also reports headache which she describes as pressure, and intermittent dizziness. She reports associated joint pain but denies stiffness or joint swelling. She denies chest pain or chest tightness. Her BP today is 122/76. She reports she also recently started the Januvia around this time and is not sure if this could be related. She has not been checking her sugars. Her last A1C was 8.5%, 06/2018.  Review of Systems      Past Medical History:  Diagnosis Date  . Allergy   . Anemia   . Cirrhosis ([place])   . Contact lens/glasses fitting    wears contacts or glasses  . Diabetes mellitus without complication ([place])   . GERD (gastroesophageal reflux disease)    occ pepcid ac  . Heart murmur   . Hyperlipidemia   . Hypertension   . Periumbilical hernia   . Primary biliary cholangitis ([facility])    AMA negative PBC  . Snores     Current Outpatient Medications  Medication Sig Dispense Refill  . Blood Glucose Monitoring Suppl (ONE TOUCH ULTRA 2) w/Device KIT     . glipiZIDE (GLUCOTROL XL) 10 MG 24 hr tablet Take 1 tablet (10 mg total) by mouth 2 (two) times daily with a meal. 180 tablet 0  . lisinopril-hydrochlorothiazide (PRINZIDE,ZESTORETIC) 20-12.5 MG tablet Take 1 tablet by mouth daily. 90 tablet 2  . Multiple Vitamins-Minerals (WOMENS MULTIVITAMIN PO) Take 2 tablets by mouth daily.    . nadolol (CORGARD) 40 MG tablet Take 1 tablet (40 mg total) by mouth daily. 90 tablet 3  . ONE TOUCH ULTRA TEST test strip     . ONETOUCH  DELICA LANCETS 67H MISC     . simvastatin (ZOCOR) 10 MG tablet TAKE 1 TABLET BY MOUTH EVERYDAY AT BEDTIME 90 tablet 2  . sitaGLIPtin (JANUVIA) 50 MG tablet Take 1 tablet (50 mg total) by mouth daily. 90 tablet 0  . ursodiol (ACTIGALL) 250 MG tablet Take 250 mg by mouth 2 (two) times daily.  3   No current facility-administered medications for this visit.     Allergies  Allergen Reactions  . Grapefruit Concentrate Other (See Comments)    blisters  . Sulfa Antibiotics Rash and Other (See Comments)    Severe dehydration  . Mushroom Extract Complex     Throat swells  . Shellfish Allergy     Hives   . Strawberry Extract     hives    Family History  Problem Relation Age of Onset  . Breast cancer Mother   . Schizophrenia Mother   . Depression Mother   . Renal Disease Father   . Diabetes Father   . Other Father        Agent Orange  . Lung cancer Maternal Grandmother   . Lung cancer Maternal Grandfather   . Heart disease Maternal Uncle   . Diabetes Other        Aunts and Uncles on both sides  . Heart disease Maternal Aunt   .  Colon cancer Neg Hx   . Esophageal cancer Neg Hx   . Stomach cancer Neg Hx   . Rectal cancer Neg Hx     Social History   Socioeconomic History  . Marital status: Married    Spouse name: Not on file  . Number of children: 1  . Years of education: Not on file  . Highest education level: Not on file  Occupational History  . Occupation: Medical illustrator  Social Needs  . Financial resource strain: Not on file  . Food insecurity:    Worry: Not on file    Inability: Not on file  . Transportation needs:    Medical: Not on file    Non-medical: Not on file  Tobacco Use  . Smoking status: Never Smoker  . Smokeless tobacco: Never Used  Substance and Sexual Activity  . Alcohol use: Yes    Comment: wine 2 times a year  . Drug use: No  . Sexual activity: Yes    Birth control/protection: Surgical  Lifestyle  . Physical activity:    Days per week: Not  on file    Minutes per session: Not on file  . Stress: Not on file  Relationships  . Social connections:    Talks on phone: Not on file    Gets together: Not on file    Attends religious service: Not on file    Active member of club or organization: Not on file    Attends meetings of clubs or organizations: Not on file    Relationship status: Not on file  . Intimate partner violence:    Fear of current or ex partner: Not on file    Emotionally abused: Not on file    Physically abused: Not on file    Forced sexual activity: Not on file  Other Topics Concern  . Not on file  Social History Narrative  . Not on file     Constitutional: Pt reports headache and abrupt weight changes. Denies fever, malaise, fatigue.  HEENT: Denies eye pain, eye redness, ear pain, ringing in the ears, wax buildup, runny nose, nasal congestion, bloody nose, or sore throat. Respiratory: Pt reports shortness of breath while walking up stairs. Denies difficulty breathing, cough or sputum production.   Cardiovascular: Pt reports swelling in legs, worse in the evening, better in the morning. Denies chest pain, chest tightness, palpitations or swelling in the hands.  Gastrointestinal: Denies abdominal pain, bloating, constipation, diarrhea or blood in the stool.  GU: Denies urgency, frequency, pain with urination, burning sensation, blood in urine, odor or discharge. Musculoskeletal: Pt reports joint pain. Denies decrease in range of motion, difficulty with gait, muscle pain or joint swelling.  Skin: Denies redness, rashes, lesions or ulcercations.  Neurological: Denies dizziness, difficulty with memory, difficulty with speech or problems with balance and coordination.  Psych: Denies anxiety, depression, SI/HI.  No other specific complaints in a complete review of systems (except as listed in HPI above).  Objective:   Physical Exam  BP 122/76   Pulse 61   Temp 98.4 F (36.9 C) (Oral)   Wt 210 lb (95.3 kg)    LMP 08/19/2018   SpO2 98%   BMI 41.36 kg/m  Wt Readings from Last 3 Encounters:  09/01/18 210 lb (95.3 kg)  07/31/18 203 lb (92.1 kg)  07/17/18 203 lb (92.1 kg)    General: Appears her stated age, obese in NAD. Neck:  Neck supple, trachea midline. No masses, lumps or thyromegaly present.  Cardiovascular:  Normal rate and rhythm. S1,S2 noted.  No murmur, rubs or gallops noted. Trace nonpitting BLE edema.  Pulmonary/Chest: Normal effort and positive vesicular breath sounds. No respiratory distress. No wheezes, rales or ronchi noted.  Musculoskeletal:  No signs of joint swelling. No difficulty with gait.  Neurological: Alert and oriented.    BMET    Component Value Date/Time   NA 135 07/17/2018 0822   K 4.0 07/17/2018 0822   CL 100 07/17/2018 0822   CO2 29 07/17/2018 0822   GLUCOSE 176 (H) 07/17/2018 0822   BUN 14 07/17/2018 0822   CREATININE 0.88 07/17/2018 0822   CALCIUM 9.8 07/17/2018 0822   GFRNONAA >90 02/21/2014 1510   GFRAA >90 02/21/2014 1510    Lipid Panel     Component Value Date/Time   CHOL 274 (H) 07/17/2018 0822   TRIG 100.0 07/17/2018 0822   HDL 79.80 07/17/2018 0822   CHOLHDL 3 07/17/2018 0822   VLDL 20.0 07/17/2018 0822   LDLCALC 174 (H) 07/17/2018 0822    CBC    Component Value Date/Time   WBC 8.0 01/02/2018 1523   RBC 4.19 01/02/2018 1523   HGB 13.0 01/02/2018 1523   HCT 38.1 01/02/2018 1523   PLT 271.0 01/02/2018 1523   MCV 90.8 01/02/2018 1523   MCH 30.3 10/22/2017 1116   MCHC 34.1 01/02/2018 1523   RDW 13.3 01/02/2018 1523   LYMPHSABS 3.0 10/07/2017 1603   MONOABS 0.5 10/07/2017 1603   EOSABS 0.3 10/07/2017 1603   BASOSABS 0.1 10/07/2017 1603    Hgb A1C Lab Results  Component Value Date   HGBA1C 8.4 (H) 07/17/2018           Assessment & Plan:

## 2018-09-01 NOTE — Patient Instructions (Signed)

## 2018-09-01 NOTE — Assessment & Plan Note (Signed)
Controlled, but concerned about SE and weight gain Likely fluid related Encouraged DASH diet, exercise for weight loss Continue Nadalol Stop Lisinopril HCTZ combined RX for Lisinopril 10 mg daily RX for HCTZ 25 mg daily Monitor BP and let me know the readings in 2 weeks If worse, repeat CMET, obtain BNP, ECG and 2D echo

## 2018-09-01 NOTE — Assessment & Plan Note (Signed)
Advised her to start monitoring fasting sugars Let me know if < 70 consistently

## 2018-09-11 ENCOUNTER — Telehealth: Payer: Self-pay

## 2018-09-11 ENCOUNTER — Other Ambulatory Visit: Payer: Self-pay

## 2018-09-11 DIAGNOSIS — K743 Primary biliary cirrhosis: Secondary | ICD-10-CM

## 2018-09-11 NOTE — Telephone Encounter (Signed)
Called and spoke to pt. Asked her to go to the lab. She expressed understanding.

## 2018-09-11 NOTE — Progress Notes (Unsigned)
Patient due for labs in December 2019.

## 2018-09-11 NOTE — Telephone Encounter (Signed)
-----   Message from Roetta Sessions, [place] sent at 06/23/2018 10:06 AM EDT ----- Regarding: labs due in December Labs due in Dec 2019: CMET, INR, AFP  PBC (primary biliary cholangitis) and cirrhosis w/o ascites,

## 2018-10-24 ENCOUNTER — Other Ambulatory Visit: Payer: Self-pay

## 2018-10-24 DIAGNOSIS — I864 Gastric varices: Secondary | ICD-10-CM

## 2018-10-24 MED ORDER — NADOLOL 40 MG PO TABS: 40.0000 mg | ORAL_TABLET | Freq: Every day | ORAL | 3 refills | Status: DC

## 2018-10-24 NOTE — Progress Notes (Signed)
Ok to fill per SA

## 2018-11-05 ENCOUNTER — Other Ambulatory Visit: Payer: Self-pay

## 2018-11-05 NOTE — Telephone Encounter (Signed)
Susan [name] with [facility] called to ck on status of refill request for HCTZ 25 mg and Lisinopril 10 mg; pt seen on 09/01/18;and pt was to cb in 2 wks with BP readings. Do not see where pt called back and unable to reach pt by phone.

## 2018-11-07 MED ORDER — HYDROCHLOROTHIAZIDE 25 MG PO TABS: 25.0000 mg | ORAL_TABLET | Freq: Every day | ORAL | 1 refills | Status: DC

## 2018-11-07 MED ORDER — LISINOPRIL 10 MG PO TABS: 10.0000 mg | ORAL_TABLET | Freq: Every day | ORAL | 1 refills | Status: DC

## 2018-11-19 ENCOUNTER — Other Ambulatory Visit: Payer: Self-pay | Admitting: Internal Medicine

## 2018-11-25 ENCOUNTER — Other Ambulatory Visit: Payer: Self-pay | Admitting: Internal Medicine

## 2018-12-29 DIAGNOSIS — K7469 Other cirrhosis of liver: Secondary | ICD-10-CM | POA: Diagnosis not present

## 2018-12-29 DIAGNOSIS — K743 Primary biliary cirrhosis: Secondary | ICD-10-CM | POA: Diagnosis not present

## 2019-02-09 ENCOUNTER — Other Ambulatory Visit: Payer: Self-pay | Admitting: Nurse Practitioner

## 2019-02-09 DIAGNOSIS — K743 Primary biliary cirrhosis: Secondary | ICD-10-CM

## 2019-02-10 ENCOUNTER — Other Ambulatory Visit: Payer: Self-pay | Admitting: Internal Medicine

## 2019-02-19 ENCOUNTER — Ambulatory Visit: Payer: BLUE CROSS/BLUE SHIELD | Admitting: Internal Medicine

## 2019-02-20 ENCOUNTER — Ambulatory Visit (INDEPENDENT_AMBULATORY_CARE_PROVIDER_SITE_OTHER): Payer: BLUE CROSS/BLUE SHIELD | Admitting: Internal Medicine

## 2019-02-20 ENCOUNTER — Encounter: Payer: Self-pay | Admitting: Internal Medicine

## 2019-02-20 DIAGNOSIS — R21 Rash and other nonspecific skin eruption: Secondary | ICD-10-CM

## 2019-02-20 MED ORDER — PERMETHRIN 5 % EX CREA: 1.0000 "application " | TOPICAL_CREAM | Freq: Once | CUTANEOUS | 0 refills | Status: AC

## 2019-02-20 MED ORDER — PREDNISONE 10 MG PO TABS: ORAL_TABLET | ORAL | 0 refills | Status: DC

## 2019-02-20 NOTE — Progress Notes (Signed)
Virtual Visit via Video Note  I connected with Susan [name] on 02/20/19 at  2:00 PM EDT by a video enabled telemedicine application and verified that I am speaking with the correct person using two identifiers.  Location: Patient: Home Provider: Office   I discussed the limitations of evaluation and management by telemedicine and the availability of in person appointments. The patient expressed understanding and agreed to proceed.  History of Present Illness:  Pt reports a rash. This started 2 weeks ago. The rash is on her face, back, arms and legs. She reports the rash looks like scratches with little red bumps. The rash is very itchy. She denies changes to soaps, lotions or detergents. She recently started a Probiotic but that is the only new med. No one in her home has a similar rash. She has tried Neosporin with minimal relief.    Past Medical History:  Diagnosis Date  . Allergy   . Anemia   . Cirrhosis ([place])   . Contact lens/glasses fitting    wears contacts or glasses  . Diabetes mellitus without complication ([place])   . GERD (gastroesophageal reflux disease)    occ pepcid ac  . Heart murmur   . Hyperlipidemia   . Hypertension   . Periumbilical hernia   . Primary biliary cholangitis ([facility])    AMA negative PBC  . Snores     Current Outpatient Medications  Medication Sig Dispense Refill  . Blood Glucose Monitoring Suppl (ONE TOUCH ULTRA 2) w/Device KIT     . glipiZIDE (GLUCOTROL XL) 10 MG 24 hr tablet Take 1 tablet (10 mg total) by mouth 2 (two) times daily with a meal. 180 tablet 0  . hydrochlorothiazide (HYDRODIURIL) 25 MG tablet Take 1 tablet (25 mg total) by mouth daily. 90 tablet 1  . JANUVIA 50 MG tablet TAKE 1 TABLET DAILY 90 tablet 0  . lisinopril (PRINIVIL,ZESTRIL) 10 MG tablet Take 1 tablet (10 mg total) by mouth daily. 90 tablet 1  . Multiple Vitamins-Minerals (WOMENS MULTIVITAMIN PO) Take 2 tablets by mouth daily.    . nadolol (CORGARD) 40 MG tablet Take 1  tablet (40 mg total) by mouth daily. 90 tablet 3  . ONE TOUCH ULTRA TEST test strip     . ONETOUCH DELICA LANCETS 87G MISC     . simvastatin (ZOCOR) 10 MG tablet TAKE 1 TABLET BY MOUTH EVERYDAY AT BEDTIME 90 tablet 2  . ursodiol (ACTIGALL) 250 MG tablet Take 250 mg by mouth 2 (two) times daily.  3  . predniSONE (DELTASONE) 10 MG tablet Take 3 tabs on days 1-3, take 2 tabs on days 4-6, take 1 tab on days 7-9 18 tablet 0   No current facility-administered medications for this visit.     Allergies  Allergen Reactions  . Grapefruit Concentrate Other (See Comments)    blisters  . Sulfa Antibiotics Rash and Other (See Comments)    Severe dehydration  . Mushroom Extract Complex     Throat swells  . Shellfish Allergy     Hives   . Strawberry Extract     hives    Family History  Problem Relation Age of Onset  . Breast cancer Mother   . Schizophrenia Mother   . Depression Mother   . Renal Disease Father   . Diabetes Father   . Other Father        Agent Orange  . Lung cancer Maternal Grandmother   . Lung cancer Maternal Grandfather   .  Heart disease Maternal Uncle   . Diabetes Other        Aunts and Uncles on both sides  . Heart disease Maternal Aunt   . Colon cancer Neg Hx   . Esophageal cancer Neg Hx   . Stomach cancer Neg Hx   . Rectal cancer Neg Hx     Social History   Socioeconomic History  . Marital status: Married    Spouse name: Not on file  . Number of children: 1  . Years of education: Not on file  . Highest education level: Not on file  Occupational History  . Occupation: Medical illustrator  Social Needs  . Financial resource strain: Not on file  . Food insecurity:    Worry: Not on file    Inability: Not on file  . Transportation needs:    Medical: Not on file    Non-medical: Not on file  Tobacco Use  . Smoking status: Never Smoker  . Smokeless tobacco: Never Used  Substance and Sexual Activity  . Alcohol use: Yes    Comment: wine 2 times a year  . Drug  use: No  . Sexual activity: Yes    Birth control/protection: Surgical  Lifestyle  . Physical activity:    Days per week: Not on file    Minutes per session: Not on file  . Stress: Not on file  Relationships  . Social connections:    Talks on phone: Not on file    Gets together: Not on file    Attends religious service: Not on file    Active member of club or organization: Not on file    Attends meetings of clubs or organizations: Not on file    Relationship status: Not on file  . Intimate partner violence:    Fear of current or ex partner: Not on file    Emotionally abused: Not on file    Physically abused: Not on file    Forced sexual activity: Not on file  Other Topics Concern  . Not on file  Social History Narrative  . Not on file     Constitutional: Denies fever, malaise, fatigue, headache or abrupt weight changes.  Respiratory: Denies difficulty breathing, shortness of breath, cough or sputum production.   Cardiovascular: Denies chest pain, chest tightness, palpitations or swelling in the hands or feet.  Skin: Pt reports rash. Denies ulcercations.   No other specific complaints in a complete review of systems (except as listed in HPI above).   Wt Readings from Last 3 Encounters:  09/01/18 210 lb (95.3 kg)  07/31/18 203 lb (92.1 kg)  07/17/18 203 lb (92.1 kg)    General: Appears her stated age, well developed, well nourished in NAD. Skin: Scattered excoriated lesions noted on face, arms, legs. Pulmonary/Chest: Normal effort. No respiratory distress.  Neurological: Alert and oriented.    BMET    Component Value Date/Time   NA 135 07/17/2018 0822   K 4.0 07/17/2018 0822   CL 100 07/17/2018 0822   CO2 29 07/17/2018 0822   GLUCOSE 176 (H) 07/17/2018 0822   BUN 14 07/17/2018 0822   CREATININE 0.88 07/17/2018 0822   CALCIUM 9.8 07/17/2018 0822   GFRNONAA >90 02/21/2014 1510   GFRAA >90 02/21/2014 1510    Lipid Panel     Component Value Date/Time   CHOL  274 (H) 07/17/2018 0822   TRIG 100.0 07/17/2018 0822   HDL 79.80 07/17/2018 0822   CHOLHDL 3 07/17/2018 0822   VLDL 20.0  07/17/2018 0822   LDLCALC 174 (H) 07/17/2018 0822    CBC    Component Value Date/Time   WBC 8.0 01/02/2018 1523   RBC 4.19 01/02/2018 1523   HGB 13.0 01/02/2018 1523   HCT 38.1 01/02/2018 1523   PLT 271.0 01/02/2018 1523   MCV 90.8 01/02/2018 1523   MCH 30.3 10/22/2017 1116   MCHC 34.1 01/02/2018 1523   RDW 13.3 01/02/2018 1523   LYMPHSABS 3.0 10/07/2017 1603   MONOABS 0.5 10/07/2017 1603   EOSABS 0.3 10/07/2017 1603   BASOSABS 0.1 10/07/2017 1603    Hgb A1C Lab Results  Component Value Date   HGBA1C 8.4 (H) 07/17/2018       Assessment and Plan:  Rash:  Allergic Dermatitis vs Scabies Will treat with Pred Taper x 6 days RX also for Permethrin 5%cream  Return precautions discussed   Follow Up Instructions:    I discussed the assessment and treatment plan with the patient. The patient was provided an opportunity to ask questions and all were answered. The patient agreed with the plan and demonstrated an understanding of the instructions.   The patient was advised to call back or seek an in-person evaluation if the symptoms worsen or if the condition fails to improve as anticipated.    [name], NP

## 2019-02-21 NOTE — Patient Instructions (Signed)
Rash, Adult    A rash is a change in the color of your skin. A rash can also change the way your skin feels. There are many different conditions and factors that can cause a rash.  Follow these instructions at home:  The goal of treatment is to stop the itching and keep the rash from spreading. Watch for any changes in your symptoms. Let your doctor know about them. Follow these instructions to help with your condition:  Medicine  Take or apply over-the-counter and prescription medicines only as told by your doctor. These may include medicines:   To treat red or swollen skin (corticosteroid creams).   To treat itching.   To treat an allergy (oral antihistamines).   To treat very bad symptoms (oral corticosteroids).    Skin care   Put cool cloths (compresses) on the affected areas.   Do not scratch or rub your skin.   Avoid covering the rash. Make sure that the rash is exposed to air as much as possible.  Managing itching and discomfort   Avoid hot showers or baths. These can make itching worse. A cold shower may help.   Try taking a bath with:  ? Epsom salts. You can get these at your local pharmacy or grocery store. Follow the instructions on the package.  ? Baking soda. Pour a small amount into the bath as told by your doctor.  ? Colloidal oatmeal. You can get this at your local pharmacy or grocery store. Follow the instructions on the package.   Try putting baking soda paste onto your skin. Stir water into baking soda until it gets like a paste.   Try putting on a lotion that relieves itchiness (calamine lotion).   Keep cool and out of the sun. Sweating and being hot can make itching worse.  General instructions     Rest as needed.   Drink enough fluid to keep your pee (urine) pale yellow.   Wear loose-fitting clothing.   Avoid scented soaps, detergents, and perfumes. Use gentle soaps, detergents, perfumes, and other cosmetic products.   Avoid anything that causes your rash. Keep a journal to  help track what causes your rash. Write down:  ? What you eat.  ? What cosmetic products you use.  ? What you drink.  ? What you wear. This includes jewelry.   Keep all follow-up visits as told by your doctor. This is important.  Contact a doctor if:   You sweat at night.   You lose weight.   You pee (urinate) more than normal.   You pee less than normal, or you notice that your pee is a darker color than normal.   You feel weak.   You throw up (vomit).   Your skin or the whites of your eyes look yellow (jaundice).   Your skin:  ? Tingles.  ? Is numb.   Your rash:  ? Does not go away after a few days.  ? Gets worse.   You are:  ? More thirsty than normal.  ? More tired than normal.   You have:  ? New symptoms.  ? Pain in your belly (abdomen).  ? A fever.  ? Watery poop (diarrhea).  Get help right away if:   You have a fever and your symptoms suddenly get worse.   You start to feel mixed up (confused).   You have a very bad headache or a stiff neck.   You have very bad joint pains   or stiffness.   You have jerky movements that you cannot control (seizure).   Your rash covers all or most of your body. The rash may or may not be painful.   You have blisters that:  ? Are on top of the rash.  ? Grow larger.  ? Grow together.  ? Are painful.  ? Are inside your nose or mouth.   You have a rash that:  ? Looks like purple pinprick-sized spots all over your body.  ? Has a "bull's eye" or looks like a target.  ? Is red and painful, causes your skin to peel, and is not from being in the sun too long.  Summary   A rash is a change in the color of your skin. A rash can also change the way your skin feels.   The goal of treatment is to stop the itching and keep the rash from spreading.   Take or apply over-the-counter and prescription medicines only as told by your doctor.   Contact a doctor if you have new symptoms or symptoms that get worse.   Keep all follow-up visits as told by your doctor. This is  important.  This information is not intended to replace advice given to you by your health care provider. Make sure you discuss any questions you have with your health care provider.  Document Released: 02/27/2008 Document Revised: 04/14/2018 Document Reviewed: 04/14/2018  Elsevier Interactive Patient Education  2019 [employer]

## 2019-02-23 ENCOUNTER — Encounter: Payer: Self-pay | Admitting: Internal Medicine

## 2019-03-12 IMAGING — MG DIGITAL SCREENING BILATERAL MAMMOGRAM WITH CAD
4 series · 4 of 4 positions shown · non-contrast
Comparison: Previous exam(s).

CLINICAL DATA: Screening.

EXAM:
DIGITAL SCREENING BILATERAL MAMMOGRAM WITH CAD

[R CC]
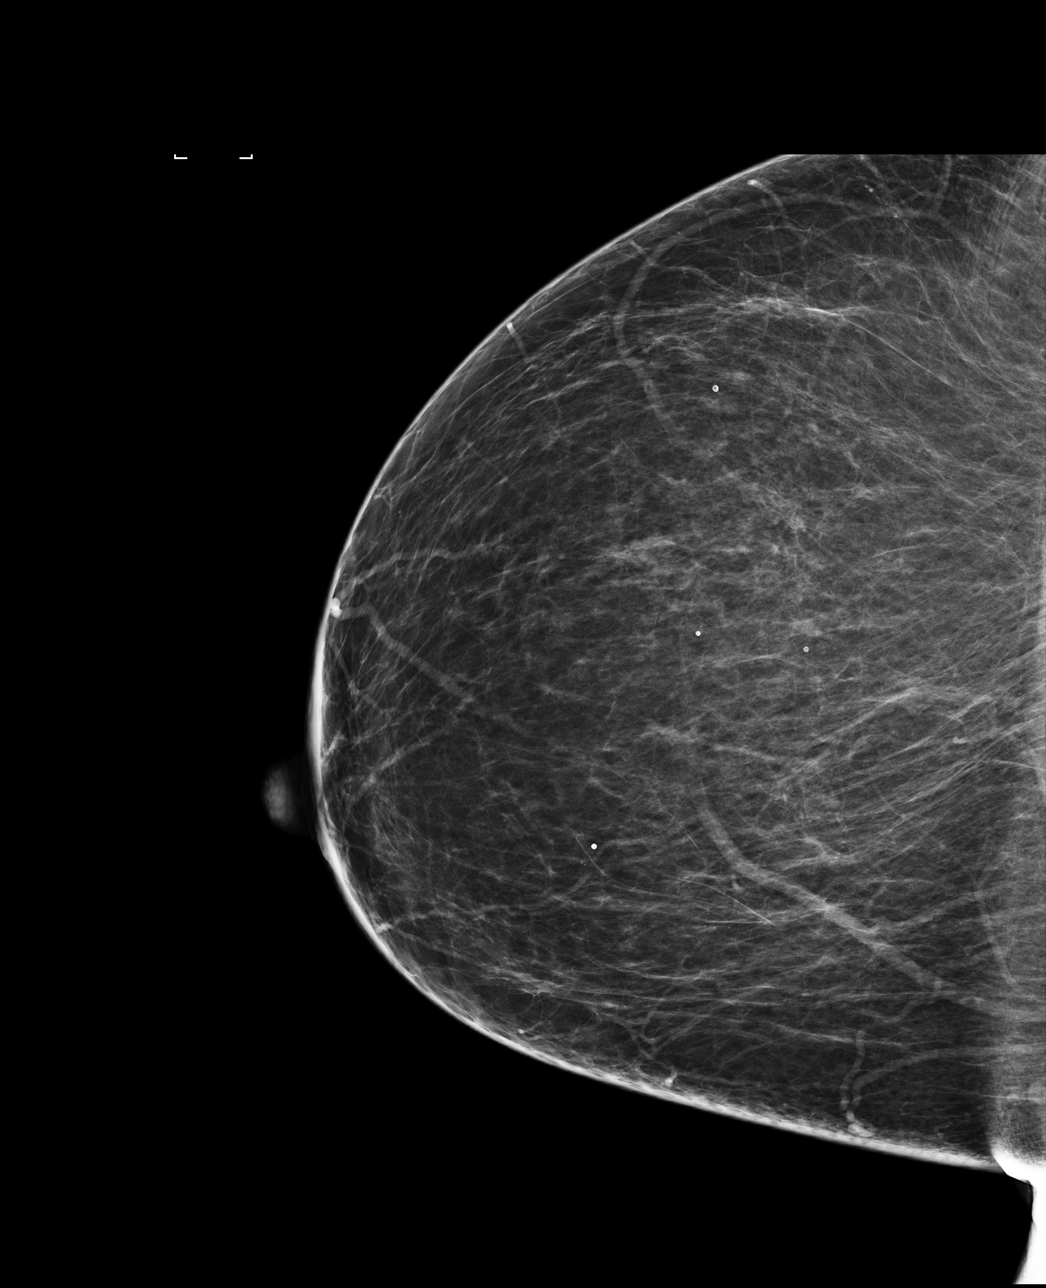

[R MLO]
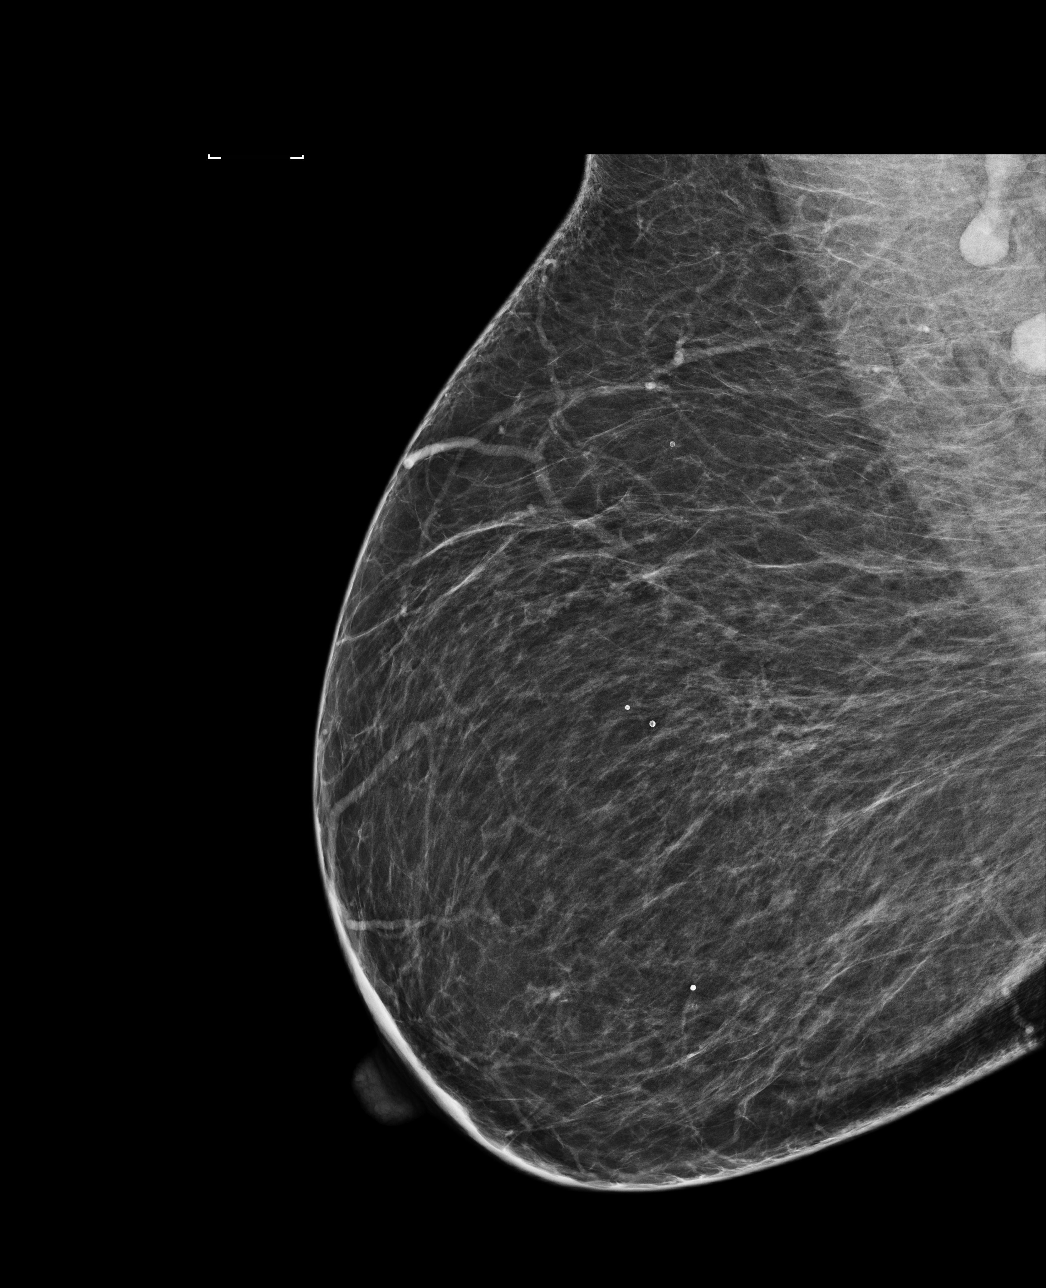

[L MLO]
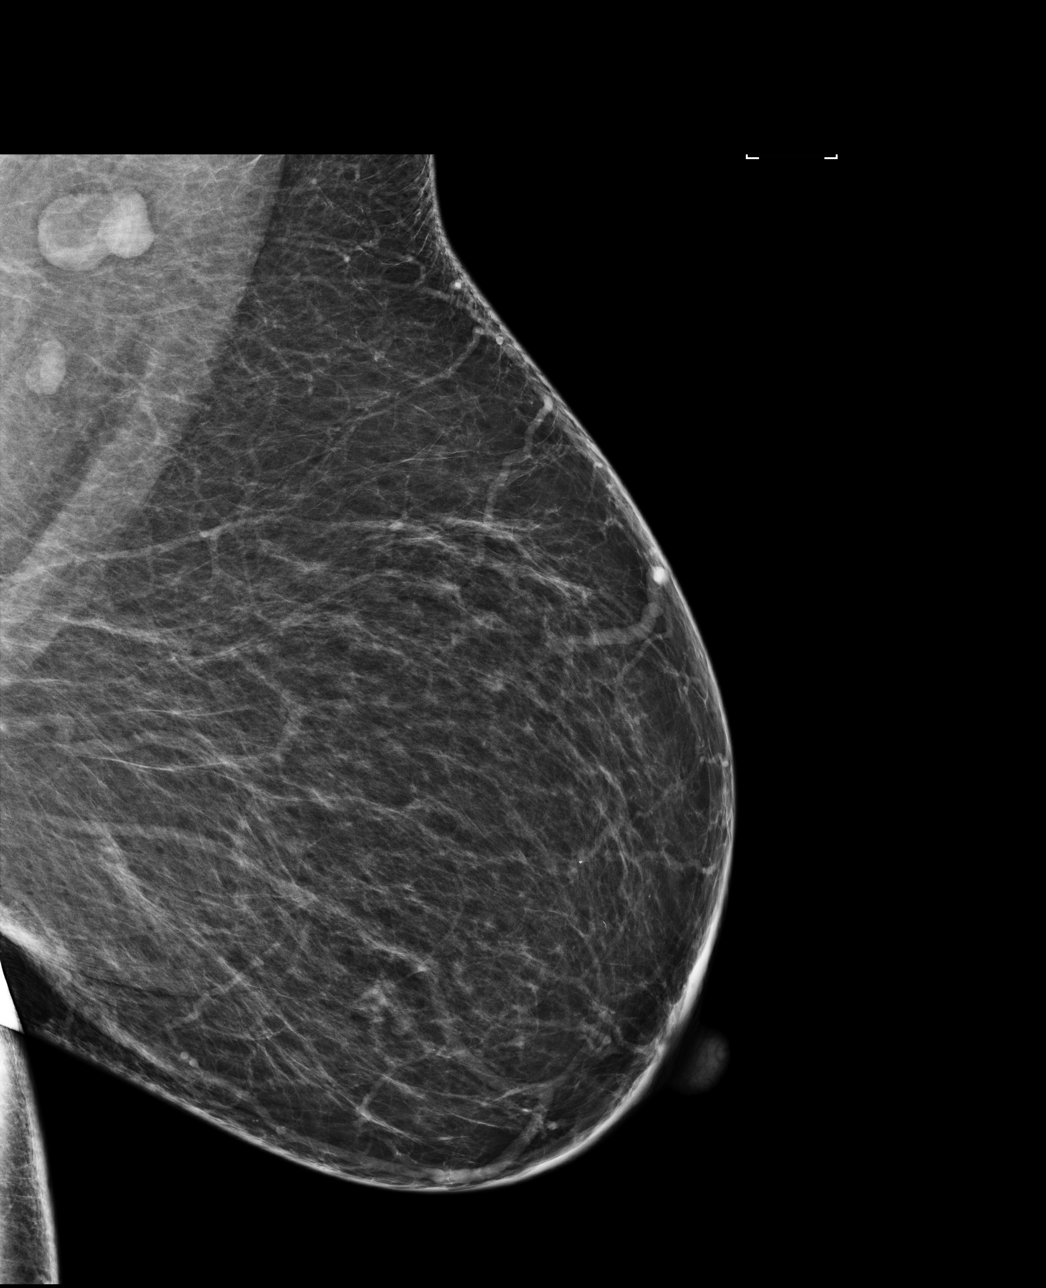

[L CC]
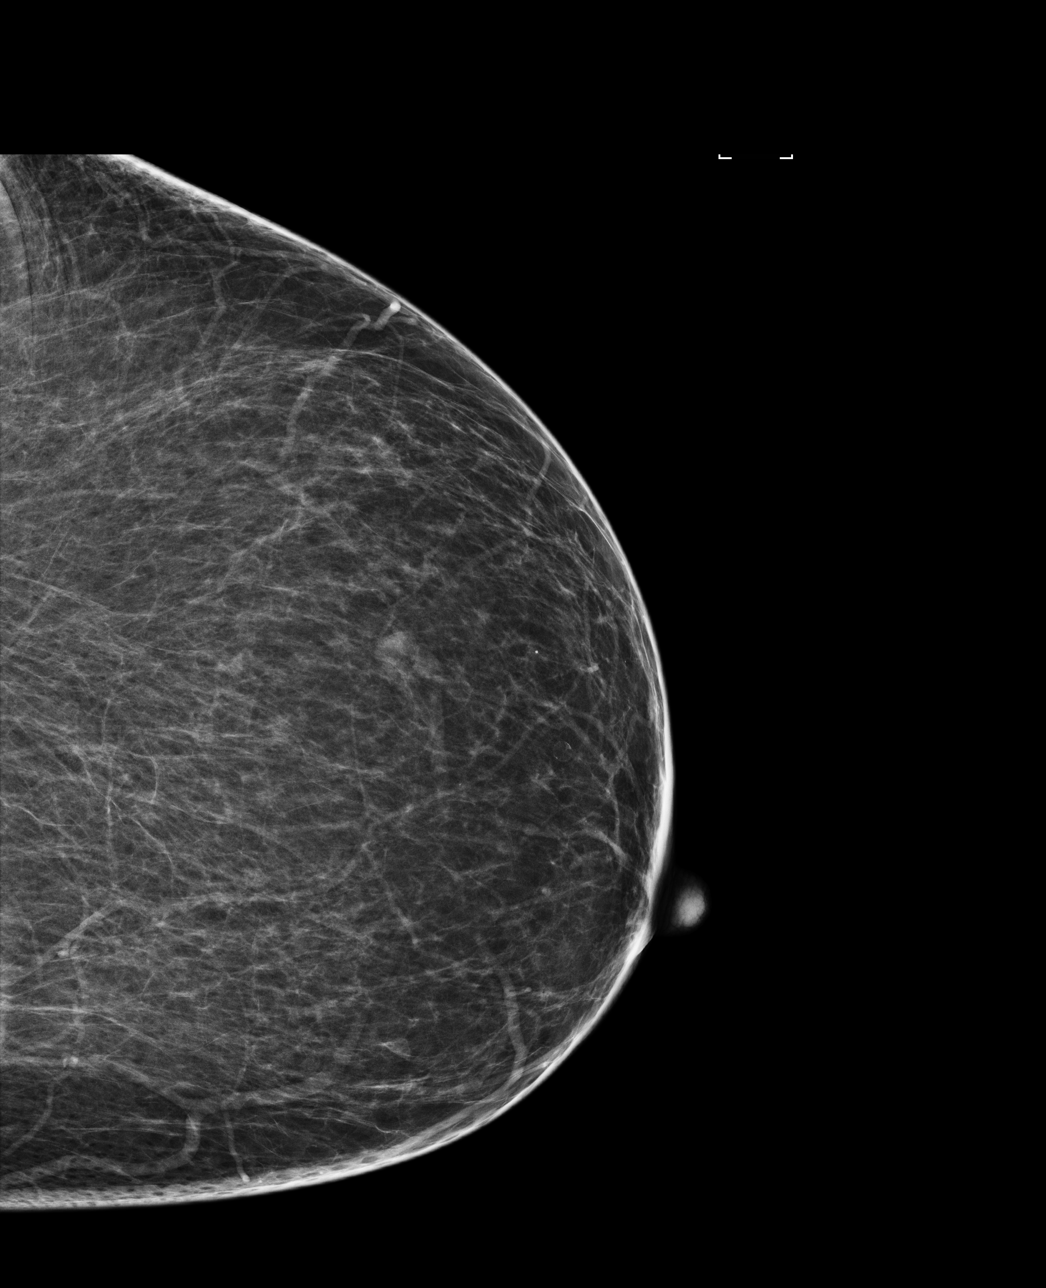

[4 of 4 positions shown; findings below may reference images not displayed]

ACR Breast Density Category b: There are scattered areas of
fibroglandular density.
FINDINGS: There are no findings suspicious for malignancy. Images were
processed with CAD.
IMPRESSION: No mammographic evidence of malignancy. A result letter of this
screening mammogram will be mailed directly to the patient.

RECOMMENDATION:
Screening mammogram in one year. (Code:AS-G-LCT)

BI-RADS CATEGORY  1: Negative.

## 2019-05-04 DIAGNOSIS — M7662 Achilles tendinitis, left leg: Secondary | ICD-10-CM | POA: Diagnosis not present

## 2019-05-04 DIAGNOSIS — M71571 Other bursitis, not elsewhere classified, right ankle and foot: Secondary | ICD-10-CM | POA: Diagnosis not present

## 2019-05-04 DIAGNOSIS — M7731 Calcaneal spur, right foot: Secondary | ICD-10-CM | POA: Diagnosis not present

## 2019-05-04 DIAGNOSIS — M71572 Other bursitis, not elsewhere classified, left ankle and foot: Secondary | ICD-10-CM | POA: Diagnosis not present

## 2019-05-04 DIAGNOSIS — M7732 Calcaneal spur, left foot: Secondary | ICD-10-CM | POA: Diagnosis not present

## 2019-05-04 DIAGNOSIS — M7661 Achilles tendinitis, right leg: Secondary | ICD-10-CM | POA: Diagnosis not present

## 2019-05-04 DIAGNOSIS — M722 Plantar fascial fibromatosis: Secondary | ICD-10-CM | POA: Diagnosis not present

## 2019-05-07 ENCOUNTER — Other Ambulatory Visit: Payer: Self-pay | Admitting: Internal Medicine

## 2019-05-12 DIAGNOSIS — M71572 Other bursitis, not elsewhere classified, left ankle and foot: Secondary | ICD-10-CM | POA: Diagnosis not present

## 2019-05-12 DIAGNOSIS — M71571 Other bursitis, not elsewhere classified, right ankle and foot: Secondary | ICD-10-CM | POA: Diagnosis not present

## 2019-05-12 DIAGNOSIS — M722 Plantar fascial fibromatosis: Secondary | ICD-10-CM | POA: Diagnosis not present

## 2019-05-12 DIAGNOSIS — M7661 Achilles tendinitis, right leg: Secondary | ICD-10-CM | POA: Diagnosis not present

## 2019-05-12 DIAGNOSIS — M7662 Achilles tendinitis, left leg: Secondary | ICD-10-CM | POA: Diagnosis not present

## 2019-05-20 DIAGNOSIS — M7661 Achilles tendinitis, right leg: Secondary | ICD-10-CM | POA: Diagnosis not present

## 2019-05-20 DIAGNOSIS — M71571 Other bursitis, not elsewhere classified, right ankle and foot: Secondary | ICD-10-CM | POA: Diagnosis not present

## 2019-05-20 DIAGNOSIS — M71572 Other bursitis, not elsewhere classified, left ankle and foot: Secondary | ICD-10-CM | POA: Diagnosis not present

## 2019-06-12 ENCOUNTER — Other Ambulatory Visit: Payer: Self-pay | Admitting: Internal Medicine

## 2019-07-03 DIAGNOSIS — K743 Primary biliary cirrhosis: Secondary | ICD-10-CM | POA: Diagnosis not present

## 2019-07-03 DIAGNOSIS — K7469 Other cirrhosis of liver: Secondary | ICD-10-CM | POA: Diagnosis not present

## 2019-07-08 ENCOUNTER — Other Ambulatory Visit: Payer: Self-pay | Admitting: Nurse Practitioner

## 2019-07-08 DIAGNOSIS — K7469 Other cirrhosis of liver: Secondary | ICD-10-CM

## 2019-07-13 ENCOUNTER — Ambulatory Visit: Payer: BC Managed Care – PPO | Admitting: Internal Medicine

## 2019-07-15 ENCOUNTER — Ambulatory Visit
Admission: RE | Admit: 2019-07-15 | Discharge: 2019-07-15 | Disposition: A | Discharge status: Home / Self Care | Payer: BLUE CROSS/BLUE SHIELD | Source: Ambulatory Visit | Attending: Nurse Practitioner | Admitting: Nurse Practitioner

## 2019-07-15 DIAGNOSIS — K7469 Other cirrhosis of liver: Secondary | ICD-10-CM

## 2019-07-15 DIAGNOSIS — K8020 Calculus of gallbladder without cholecystitis without obstruction: Secondary | ICD-10-CM | POA: Diagnosis not present

## 2019-07-15 DIAGNOSIS — K7460 Unspecified cirrhosis of liver: Secondary | ICD-10-CM | POA: Diagnosis not present

## 2019-07-21 ENCOUNTER — Other Ambulatory Visit: Payer: Self-pay

## 2019-07-21 ENCOUNTER — Ambulatory Visit (INDEPENDENT_AMBULATORY_CARE_PROVIDER_SITE_OTHER): Payer: BC Managed Care – PPO | Admitting: Internal Medicine

## 2019-07-21 ENCOUNTER — Encounter: Payer: Self-pay | Admitting: Internal Medicine

## 2019-07-21 VITALS — BP 124/80 | HR 74 | Temp 97.9°F | Ht 60.0 in | Wt 213.0 lb

## 2019-07-21 DIAGNOSIS — K7469 Other cirrhosis of liver: Secondary | ICD-10-CM | POA: Diagnosis not present

## 2019-07-21 DIAGNOSIS — K743 Primary biliary cirrhosis: Secondary | ICD-10-CM | POA: Diagnosis not present

## 2019-07-21 DIAGNOSIS — I10 Essential (primary) hypertension: Secondary | ICD-10-CM | POA: Diagnosis not present

## 2019-07-21 DIAGNOSIS — K219 Gastro-esophageal reflux disease without esophagitis: Secondary | ICD-10-CM | POA: Diagnosis not present

## 2019-07-21 DIAGNOSIS — Z0000 Encounter for general adult medical examination without abnormal findings: Secondary | ICD-10-CM

## 2019-07-21 DIAGNOSIS — E119 Type 2 diabetes mellitus without complications: Secondary | ICD-10-CM | POA: Diagnosis not present

## 2019-07-21 DIAGNOSIS — E7800 Pure hypercholesterolemia, unspecified: Secondary | ICD-10-CM

## 2019-07-21 LAB — COMPREHENSIVE METABOLIC PANEL
ALT: 24 U/L (ref 0–35)
AST: 28 U/L (ref 0–37)
Albumin: 3.7 g/dL (ref 3.5–5.2)
Alkaline Phosphatase: 132 U/L — ABNORMAL HIGH (ref 39–117)
BUN: 12 mg/dL (ref 6–23)
CO2: 34 mEq/L — ABNORMAL HIGH (ref 19–32)
Calcium: 9.6 mg/dL (ref 8.4–10.5)
Chloride: 98 mEq/L (ref 96–112)
Creatinine, Ser: 0.73 mg/dL (ref 0.40–1.20)
GFR: 100.99 mL/min (ref >60.00)
Glucose, Bld: 172 mg/dL — ABNORMAL HIGH (ref 70–99)
Potassium: 4.1 mEq/L (ref 3.5–5.1)
Sodium: 136 mEq/L (ref 135–145)
Total Bilirubin: 0.7 mg/dL (ref 0.2–1.2)
Total Protein: 7.2 g/dL (ref 6.0–8.3)

## 2019-07-21 LAB — LIPID PANEL
Cholesterol: 321 mg/dL — ABNORMAL HIGH (ref 0–200)
HDL: 104.4 mg/dL (ref >39.00)
LDL Cholesterol: 198 mg/dL — ABNORMAL HIGH (ref 0–99)
NonHDL: 216.17
Total CHOL/HDL Ratio: 3
Triglycerides: 90 mg/dL (ref 0.0–149.0)
VLDL: 18 mg/dL (ref 0.0–40.0)

## 2019-07-21 LAB — CBC
HCT: 41.5 % (ref 36.0–46.0)
Hemoglobin: 13.7 g/dL (ref 12.0–15.0)
MCHC: 33 g/dL (ref 30.0–36.0)
MCV: 93.8 fl (ref 78.0–100.0)
Platelets: 213 10*3/uL (ref 150.0–400.0)
RBC: 4.42 Mil/uL (ref 3.87–5.11)
RDW: 12.9 % (ref 11.5–15.5)
WBC: 8 10*3/uL (ref 4.0–10.5)

## 2019-07-21 LAB — HEMOGLOBIN A1C: Hgb A1c MFr Bld: 8 % — ABNORMAL HIGH (ref 4.6–6.5)

## 2019-07-21 LAB — PROTIME-INR
INR: 1.2 ratio — ABNORMAL HIGH (ref 0.8–1.0)
Prothrombin Time: 13.6 s — ABNORMAL HIGH (ref 9.6–13.1)

## 2019-07-21 MED ORDER — FLUTICASONE PROPIONATE 50 MCG/ACT NA SUSP: 2.0000 | Freq: Every day | NASAL | 1 refills | Status: DC

## 2019-07-21 NOTE — Progress Notes (Signed)
Subjective:    Patient ID: Susan [name], female    DOB: Jul 05, 1966, 53 y.o.   MRN: [id number]  HPI  Patient presents the clinic today for her annual exam.  She is also due to follow-up chronic conditions.  DM 2: Her last A1c was 8.4%, 06/2018.  She does not monitor her sugars.  She is taking Glipizide as prescribed.  She checks her feet routinely.  Her last eye exam was 10/2018.  HLD: Her last LDL was 174, 06/2018.  She denies myalgias on Simvastatin.  She tries to consume a low-fat diet.  HTN: Her BP today is 124/80.  She is taking Lisinopril, and Nadolol and HCTZ as prescribed.  ECG from 07/2013 reviewed.  PBC/Florrid Cirrhosis: No issues on Urosodiol.  She is requesting labs to be performed that are needed by her liver specialist.  She follows with hepatology yearly.  Flu: 06/2018 Tetanus: 07/2013 Pneumovax: 10/2016 Pap smear: 03/2018 Mammogram: 04/2018 Colon screening:07/2018 Vision screening: 10/2018 Dentist: [facility]  Diet: She does eat meat.  She consumes some fruits but more veggies.  She tries to avoid fried foods.  She drinks mostly water. Exercise: Walking.  Review of Systems      Past Medical History:  Diagnosis Date  . Allergy   . Anemia   . Cirrhosis ([place])   . Contact lens/glasses fitting    wears contacts or glasses  . Diabetes mellitus without complication ([place])   . GERD (gastroesophageal reflux disease)    occ pepcid ac  . Heart murmur   . Hyperlipidemia   . Hypertension   . Periumbilical hernia   . Primary biliary cholangitis ([facility])    AMA negative PBC  . Snores     Current Outpatient Medications  Medication Sig Dispense Refill  . Blood Glucose Monitoring Suppl (ONE TOUCH ULTRA 2) w/Device KIT     . glipiZIDE (GLUCOTROL XL) 10 MG 24 hr tablet Take 1 tablet (10 mg total) by mouth 2 (two) times daily with a meal. MUST SCHEDULE PHYSICAL 180 tablet 0  . hydrochlorothiazide (HYDRODIURIL) 25 MG tablet Take 1 tablet (25 mg total) by mouth daily.  MUST SCHEDULE PHYSICAL 90 tablet 0  . lisinopril (ZESTRIL) 10 MG tablet Take 1 tablet (10 mg total) by mouth daily. MUST SCHEDULE PHYSICAL 90 tablet 0  . Multiple Vitamins-Minerals (WOMENS MULTIVITAMIN PO) Take 2 tablets by mouth daily.    . nadolol (CORGARD) 40 MG tablet Take 1 tablet (40 mg total) by mouth daily. 90 tablet 3  . ONE TOUCH ULTRA TEST test strip     . ONETOUCH DELICA LANCETS 37T MISC     . simvastatin (ZOCOR) 10 MG tablet TAKE 1 TABLET BY MOUTH EVERYDAY AT BEDTIME 90 tablet 2  . ursodiol (ACTIGALL) 250 MG tablet Take 250 mg by mouth 2 (two) times daily.  3  . fluticasone (FLONASE) 50 MCG/ACT nasal spray Place 2 sprays into both nostrils daily. 16 g 1  . meloxicam (MOBIC) 7.5 MG tablet Take 7.5 mg by mouth daily.     No current facility-administered medications for this visit.     Allergies  Allergen Reactions  . Grapefruit Concentrate Other (See Comments)    blisters  . Sulfa Antibiotics Rash and Other (See Comments)    Severe dehydration  . Mushroom Extract Complex     Throat swells  . Shellfish Allergy     Hives   . Strawberry Extract     hives    Family History  Problem Relation Age  of Onset  . Breast cancer Mother   . Schizophrenia Mother   . Depression Mother   . Renal Disease Father   . Diabetes Father   . Other Father        Agent Orange  . Lung cancer Maternal Grandmother   . Lung cancer Maternal Grandfather   . Heart disease Maternal Uncle   . Diabetes Other        Aunts and Uncles on both sides  . Heart disease Maternal Aunt   . Colon cancer Neg Hx   . Esophageal cancer Neg Hx   . Stomach cancer Neg Hx   . Rectal cancer Neg Hx     Social History   Socioeconomic History  . Marital status: Married    Spouse name: Not on file  . Number of children: 1  . Years of education: Not on file  . Highest education level: Not on file  Occupational History  . Occupation: Medical illustrator  Social Needs  . Financial resource strain: Not on file  .  Food insecurity    Worry: Not on file    Inability: Not on file  . Transportation needs    Medical: Not on file    Non-medical: Not on file  Tobacco Use  . Smoking status: Never Smoker  . Smokeless tobacco: Never Used  Substance and Sexual Activity  . Alcohol use: Yes    Comment: wine 2 times a year  . Drug use: No  . Sexual activity: Yes    Birth control/protection: Surgical  Lifestyle  . Physical activity    Days per week: Not on file    Minutes per session: Not on file  . Stress: Not on file  Relationships  . Social Herbalist on phone: Not on file    Gets together: Not on file    Attends religious service: Not on file    Active member of club or organization: Not on file    Attends meetings of clubs or organizations: Not on file    Relationship status: Not on file  . Intimate partner violence    Fear of current or ex partner: Not on file    Emotionally abused: Not on file    Physically abused: Not on file    Forced sexual activity: Not on file  Other Topics Concern  . Not on file  Social History Narrative  . Not on file     Constitutional: Denies fever, malaise, fatigue, headache or abrupt weight changes.  HEENT: Denies eye pain, eye redness, ear pain, ringing in the ears, wax buildup, runny nose, nasal congestion, bloody nose, or sore throat. Respiratory: Denies difficulty breathing, shortness of breath, cough or sputum production.   Cardiovascular: Denies chest pain, chest tightness, palpitations or swelling in the hands or feet.  Gastrointestinal: Denies abdominal pain, bloating, constipation, diarrhea or blood in the stool.  GU: Denies urgency, frequency, pain with urination, burning sensation, blood in urine, odor or discharge. Musculoskeletal: Denies decrease in range of motion, difficulty with gait, muscle pain or joint pain and swelling.  Skin: Denies redness, rashes, lesions or ulcercations.  Neurological: Denies dizziness, difficulty with  memory, difficulty with speech or problems with balance and coordination.  Psych: Denies anxiety, depression, SI/HI.  No other specific complaints in a complete review of systems (except as listed in HPI above).  Objective:   Physical Exam BP 124/80   Pulse 74   Temp 97.9 F (36.6 C) (Temporal)   Ht  5' (1.524 m)   Wt 213 lb (96.6 kg)   LMP 07/07/2019   SpO2 98%   BMI 41.60 kg/m  Wt Readings from Last 3 Encounters:  07/21/19 213 lb (96.6 kg)  09/01/18 210 lb (95.3 kg)  07/31/18 203 lb (92.1 kg)    General: Appears her stated age, obese, in NAD. Skin: Warm, dry and intact. No rashes ulcerations noted. HEENT: Head: normal shape and size; Eyes: sclera white, no icterus, conjunctiva pink, PERRLA and EOMs intact; Ears: Tm's gray and intact, normal light reflex;  Neck:  Neck supple, trachea midline. No masses, lumps or thyromegaly present.  Cardiovascular: Normal rate and rhythm. S1,S2 noted.  No murmur, rubs or gallops noted. No JVD or BLE edema. No carotid bruits noted. Pulmonary/Chest: Normal effort and positive vesicular breath sounds. No respiratory distress. No wheezes, rales or ronchi noted.  Abdomen: Soft and nontender. Normal bowel sounds. No distention or masses noted. Liver, spleen and kidneys non palpable. Musculoskeletal: Strength 5/5 BUE/BLE. No difficulty with gait.  Neurological: Alert and oriented. Cranial nerves II-XII grossly intact. Coordination normal.  Psychiatric: Mood and affect normal. Behavior is normal. Judgment and thought content normal.     BMET    Component Value Date/Time   NA 135 07/17/2018 0822   K 4.0 07/17/2018 0822   CL 100 07/17/2018 0822   CO2 29 07/17/2018 0822   GLUCOSE 176 (H) 07/17/2018 0822   BUN 14 07/17/2018 0822   CREATININE 0.88 07/17/2018 0822   CALCIUM 9.8 07/17/2018 0822   GFRNONAA >90 02/21/2014 1510   GFRAA >90 02/21/2014 1510    Lipid Panel     Component Value Date/Time   CHOL 274 (H) 07/17/2018 0822   TRIG 100.0  07/17/2018 0822   HDL 79.80 07/17/2018 0822   CHOLHDL 3 07/17/2018 0822   VLDL 20.0 07/17/2018 0822   LDLCALC 174 (H) 07/17/2018 0822    CBC    Component Value Date/Time   WBC 8.0 01/02/2018 1523   RBC 4.19 01/02/2018 1523   HGB 13.0 01/02/2018 1523   HCT 38.1 01/02/2018 1523   PLT 271.0 01/02/2018 1523   MCV 90.8 01/02/2018 1523   MCH 30.3 10/22/2017 1116   MCHC 34.1 01/02/2018 1523   RDW 13.3 01/02/2018 1523   LYMPHSABS 3.0 10/07/2017 1603   MONOABS 0.5 10/07/2017 1603   EOSABS 0.3 10/07/2017 1603   BASOSABS 0.1 10/07/2017 1603    Hgb A1C Lab Results  Component Value Date   HGBA1C 8.4 (H) 07/17/2018             Assessment & Plan:   Preventative Health Maintenance:  Flu shot today Tetanus UTD Pneumovax UTD Pap smear UTD She will call to schedule her mammogram Colon screening UTD Encouraged her to consume a balanced diet and exercise regimen Advised her to see an eye doctor and dentist annually Will check CBC, CMET, TSH, Lipid, A1C and Vit D today   RTC in 3 months to follow up DM 2 [name], NP

## 2019-07-22 LAB — IGG, IGA, IGM
IgG (Immunoglobin G), Serum: 1823 mg/dL — ABNORMAL HIGH (ref 600–1640)
IgM, Serum: 207 mg/dL (ref 50–300)
Immunoglobulin A: 55 mg/dL (ref 47–310)

## 2019-07-22 LAB — AFP TUMOR MARKER: AFP-Tumor Marker: 10.1 ng/mL — ABNORMAL HIGH

## 2019-07-23 ENCOUNTER — Encounter: Payer: Self-pay | Admitting: Gastroenterology

## 2019-07-23 LAB — VITAMIN D 25 HYDROXY (VIT D DEFICIENCY, FRACTURES): VITD: 35.65 ng/mL (ref 30.00–100.00)

## 2019-07-23 LAB — TSH: TSH: 1.39 u[IU]/mL (ref 0.35–4.50)

## 2019-07-26 NOTE — Patient Instructions (Signed)
Health Maintenance, Female Adopting a healthy lifestyle and getting preventive care are important in promoting health and wellness. Ask your health care provider about:  The right schedule for you to have regular tests and exams.  Things you can do on your own to prevent diseases and keep yourself healthy. What should I know about diet, weight, and exercise? Eat a healthy diet   Eat a diet that includes plenty of vegetables, fruits, low-fat dairy products, and lean protein.  Do not eat a lot of foods that are high in solid fats, added sugars, or sodium. Maintain a healthy weight Body mass index (BMI) is used to identify weight problems. It estimates body fat based on height and weight. Your health care provider can help determine your BMI and help you achieve or maintain a healthy weight. Get regular exercise Get regular exercise. This is one of the most important things you can do for your health. Most adults should:  Exercise for at least 150 minutes each week. The exercise should increase your heart rate and make you sweat (moderate-intensity exercise).  Do strengthening exercises at least twice a week. This is in addition to the moderate-intensity exercise.  Spend less time sitting. Even light physical activity can be beneficial. Watch cholesterol and blood lipids Have your blood tested for lipids and cholesterol at 52 years of age, then have this test every 5 years. Have your cholesterol levels checked more often if:  Your lipid or cholesterol levels are high.  You are older than 52 years of age.  You are at high risk for heart disease. What should I know about cancer screening? Depending on your health history and family history, you may need to have cancer screening at various ages. This may include screening for:  Breast cancer.  Cervical cancer.  Colorectal cancer.  Skin cancer.  Lung cancer. What should I know about heart disease, diabetes, and high blood  pressure? Blood pressure and heart disease  High blood pressure causes heart disease and increases the risk of stroke. This is more likely to develop in people who have high blood pressure readings, are of African descent, or are overweight.  Have your blood pressure checked: ? Every 3-5 years if you are 18-39 years of age. ? Every year if you are 40 years old or older. Diabetes Have regular diabetes screenings. This checks your fasting blood sugar level. Have the screening done:  Once every three years after age 40 if you are at a normal weight and have a low risk for diabetes.  More often and at a younger age if you are overweight or have a high risk for diabetes. What should I know about preventing infection? Hepatitis B If you have a higher risk for hepatitis B, you should be screened for this virus. Talk with your health care provider to find out if you are at risk for hepatitis B infection. Hepatitis C Testing is recommended for:  Everyone born from 1945 through 1965.  Anyone with known risk factors for hepatitis C. Sexually transmitted infections (STIs)  Get screened for STIs, including gonorrhea and chlamydia, if: ? You are sexually active and are younger than 52 years of age. ? You are older than 52 years of age and your health care provider tells you that you are at risk for this type of infection. ? Your sexual activity has changed since you were last screened, and you are at increased risk for chlamydia or gonorrhea. Ask your health care provider if   you are at risk.  Ask your health care provider about whether you are at high risk for HIV. Your health care provider may recommend a prescription medicine to help prevent HIV infection. If you choose to take medicine to prevent HIV, you should first get tested for HIV. You should then be tested every 3 months for as long as you are taking the medicine. Pregnancy  If you are about to stop having your period (premenopausal) and  you may become pregnant, seek counseling before you get pregnant.  Take 400 to 800 micrograms (mcg) of folic acid every day if you become pregnant.  Ask for birth control (contraception) if you want to prevent pregnancy. Osteoporosis and menopause Osteoporosis is a disease in which the bones lose minerals and strength with aging. This can result in bone fractures. If you are 65 years old or older, or if you are at risk for osteoporosis and fractures, ask your health care provider if you should:  Be screened for bone loss.  Take a calcium or vitamin D supplement to lower your risk of fractures.  Be given hormone replacement therapy (HRT) to treat symptoms of menopause. Follow these instructions at home: Lifestyle  Do not use any products that contain nicotine or tobacco, such as cigarettes, e-cigarettes, and chewing tobacco. If you need help quitting, ask your health care provider.  Do not use street drugs.  Do not share needles.  Ask your health care provider for help if you need support or information about quitting drugs. Alcohol use  Do not drink alcohol if: ? Your health care provider tells you not to drink. ? You are pregnant, may be pregnant, or are planning to become pregnant.  If you drink alcohol: ? Limit how much you use to 0-1 drink a day. ? Limit intake if you are breastfeeding.  Be aware of how much alcohol is in your drink. In the [place] one drink equals one 12 oz bottle of beer (355 mL), one 5 oz glass of wine (148 mL), or one 1 oz glass of hard liquor (44 mL). General instructions  Schedule regular health, dental, and eye exams.  Stay current with your vaccines.  Tell your health care provider if: ? You often feel depressed. ? You have ever been abused or do not feel safe at home. Summary  Adopting a healthy lifestyle and getting preventive care are important in promoting health and wellness.  Follow your health care provider's instructions about healthy  diet, exercising, and getting tested or screened for diseases.  Follow your health care provider's instructions on monitoring your cholesterol and blood pressure. This information is not intended to replace advice given to you by your health care provider. Make sure you discuss any questions you have with your health care provider. Document Released: 03/26/2011 Document Revised: 09/03/2018 Document Reviewed: 09/03/2018 Elsevier Patient Education  2020 [employer] Inc.  

## 2019-07-26 NOTE — Assessment & Plan Note (Signed)
Controlled on Lisinoporil, Nadalol and HCTZ Reinforced DASH diet and exercise for weight loss  CMET today

## 2019-07-26 NOTE — Assessment & Plan Note (Signed)
A1c today No urine microalbumin secondary to ACE therapy Encouraged her to consume a low carb diet and exercise for weight loss Continue Glipizide for now Foot exam today Encouraged yearly eye exam Flu shot today Pneumovax up-to-date

## 2019-07-26 NOTE — Assessment & Plan Note (Signed)
AFP, IgG, IgA, and IgM today per request of her hepatologist Continue Urosodiol

## 2019-07-26 NOTE — Assessment & Plan Note (Signed)
CMET and Lipid profile today Encouraged her to consume a low fat diet Continue Simvastatin for now 

## 2019-07-27 ENCOUNTER — Encounter: Payer: Self-pay | Admitting: Internal Medicine

## 2019-07-29 ENCOUNTER — Encounter: Payer: Self-pay | Admitting: Internal Medicine

## 2019-08-05 ENCOUNTER — Other Ambulatory Visit: Payer: Self-pay | Admitting: Internal Medicine

## 2019-08-12 MED ORDER — SIMVASTATIN 10 MG PO TABS: ORAL_TABLET | ORAL | 0 refills | Status: DC

## 2019-08-13 ENCOUNTER — Other Ambulatory Visit: Payer: Self-pay | Admitting: Internal Medicine

## 2019-08-26 ENCOUNTER — Other Ambulatory Visit: Payer: Self-pay | Admitting: Internal Medicine

## 2019-10-12 ENCOUNTER — Ambulatory Visit: Payer: BC Managed Care – PPO | Admitting: Gastroenterology

## 2019-10-12 ENCOUNTER — Encounter: Payer: Self-pay | Admitting: Gastroenterology

## 2019-10-12 VITALS — BP 150/86 | HR 80 | Temp 98.2°F | Ht 59.75 in | Wt 218.1 lb

## 2019-10-12 DIAGNOSIS — K743 Primary biliary cirrhosis: Secondary | ICD-10-CM | POA: Diagnosis not present

## 2019-10-12 DIAGNOSIS — K7460 Unspecified cirrhosis of liver: Secondary | ICD-10-CM

## 2019-10-12 DIAGNOSIS — I864 Gastric varices: Secondary | ICD-10-CM | POA: Diagnosis not present

## 2019-10-12 MED ORDER — NADOLOL 40 MG PO TABS: 40.0000 mg | ORAL_TABLET | Freq: Every day | ORAL | 3 refills | Status: DC

## 2019-10-12 NOTE — Progress Notes (Signed)
HPI :  54 year old female here for a follow-up visit for AMA negative PBC.  Dx January 2019.  ANA positive 1:160, SMA 35, and IgG 2094, AMA negative. Liver biopsy done 10/22/2017 - fibrotic changes noted 3-4/4 noted, AMA negative PBC versus PSC versus drug reaction were noted. MRCP 11/28/2017 - suggestion of cirrhosis, bile ducts normal, no evidence of PSC, suggestion of gastric varices. She was referred to see Hepatology, Dr. Zollie Scale / Osie Cheeks - thought to have AMA negative PBC, started Ursodiol 583m BID. Her liver enzymes improved on this regimen.  I last saw her at the end of 2019 for an EGD and colonoscopy.  Results as outlined below.  She had small gastric varices noted, which were also noted on her prior MRI imaging.  She was started on nadolol 40 mg a day.  She had been tolerating it but ran out of it and recently resumed it, she does need a refill on this.  She has continue to follow-up with hepatology periodically, they do not think she meets criteria for autoimmune overlap at this time.  Her liver enzymes are mostly normal other than mild elevation in alkaline phosphatase.  She had an ultrasound in October which showed stable changes of cirrhosis with some cholelithiasis.  She did not have any abdominal pains that bother her.  She denies any problems with reflux that are bothering her at present.  She had been taking Mobic for ankle pain bilaterally and for her Achilles.  She stopped taking it as a she does not think it works very well.  She has had a few steroid injections to the area.  She is frustrated by weight gain recently, has had a hard time losing weight.  We discussed options for that. We immunized her to hepatitis A and B since her last visit.   EGD 07/31/18 -  - The exam of the esophagus was otherwise normal. No esophageal varices. - Possible type 1 isolated gastric varices (IGV1, varices located in the fundus) were found in the cardia / fundus. The fundus was angulated. Despite  the endoscope maximally retroflexed in multiple angles, the entire cardia could not be well visualized. - The exam of the stomach was otherwise normal. - The duodenal bulb and second portion of the duodenum were normal.   Colonoscopy 07/31/18 - The perianal and digital rectal examinations were normal. - A 3 mm polyp was found in the cecum. The polyp was sessile. The polyp was removed with a cold snare. Resection and retrieval were complete. - A diminutive polyp was found in the cecum. The polyp was sessile. It was not able to be grasped with the snare. The polyp was removed with a cold biopsy forceps. Resection and retrieval were complete. - Anal papilla(e) were hypertrophied. - Internal hemorrhoids were found during retroflexion, with a suspected rectal varix. - There were prominent lymphoid aggregates in the right colon. The exam was otherwise without abnormality.  Inflammatory polyps, no adenomas  [place] - gallstones, cirrhosis       Past Medical History:  Diagnosis Date  . Allergy   . Anemia   . Cirrhosis ([place]   . Contact lens/glasses fitting    wears contacts or glasses  . Diabetes mellitus without complication ([place]   . Gastric varices   . GERD (gastroesophageal reflux disease)    occ pepcid ac  . Heart murmur   . Hyperlipidemia   . Hypertension   . Periumbilical hernia   . Primary biliary cholangitis ([place]  AMA negative PBC  . Snores      Past Surgical History:  Procedure Laterality Date  . DILATION AND CURETTAGE OF UTERUS  11/1995, 10/1997  . TUBAL LIGATION  10/1997  . UMBILICAL HERNIA REPAIR N/A 08/11/2013   Procedure: HERNIA REPAIR UMBILICAL ADULT;  Surgeon: Gwenyth Ober, MD;  Location: [facility];  Service: General;  Laterality: N/A;   Family History  Problem Relation Age of Onset  . Breast cancer Mother   . Schizophrenia Mother   . Depression Mother   . Uterine cancer Mother   . Renal Disease Father   . Diabetes Father   .  Other Father        Agent Orange  . Lung cancer Maternal Grandmother   . Lung cancer Maternal Grandfather   . Heart disease Maternal Uncle   . Diabetes Other        Aunts and Uncles on both sides  . Heart disease Maternal Aunt   . Colon cancer Neg Hx   . Esophageal cancer Neg Hx   . Stomach cancer Neg Hx   . Rectal cancer Neg Hx    Social History   Tobacco Use  . Smoking status: Never Smoker  . Smokeless tobacco: Never Used  Substance Use Topics  . Alcohol use: Yes    Comment: wine 2 times a year  . Drug use: No   Current Outpatient Medications  Medication Sig Dispense Refill  . Blood Glucose Monitoring Suppl (ONE TOUCH ULTRA 2) w/Device KIT     . fluticasone (FLONASE) 50 MCG/ACT nasal spray SPRAY 2 SPRAYS INTO EACH NOSTRIL EVERY DAY (Patient taking differently: as needed. ) 48 mL 1  . glipiZIDE (GLUCOTROL XL) 10 MG 24 hr tablet Take 1 tablet (10 mg total) by mouth 2 (two) times daily with a meal. MUST SCHEDULE PHYSICAL 180 tablet 0  . hydrochlorothiazide (HYDRODIURIL) 25 MG tablet Take 1 tablet (25 mg total) by mouth daily. 90 tablet 0  . lisinopril (ZESTRIL) 10 MG tablet Take 1 tablet (10 mg total) by mouth daily. 90 tablet 0  . meloxicam (MOBIC) 7.5 MG tablet Take 7.5 mg by mouth daily. As needed    . Multiple Vitamins-Minerals (WOMENS MULTIVITAMIN PO) Take 2 tablets by mouth daily.    . nadolol (CORGARD) 40 MG tablet Take 1 tablet (40 mg total) by mouth daily. 90 tablet 3  . ONE TOUCH ULTRA TEST test strip     . ONETOUCH DELICA LANCETS 53I MISC     . simvastatin (ZOCOR) 10 MG tablet TAKE 1 TABLET BY MOUTH EVERYDAY AT BEDTIME 90 tablet 0  . ursodiol (ACTIGALL) 250 MG tablet Take 250 mg by mouth 2 (two) times daily.  3   No current facility-administered medications for this visit.   Allergies  Allergen Reactions  . Grapefruit Concentrate Other (See Comments)    blisters  . Sulfa Antibiotics Rash and Other (See Comments)    Severe dehydration  . Mushroom Extract Complex      Throat swells  . Shellfish Allergy     Hives   . Strawberry Extract     hives     Review of Systems: All systems reviewed and negative except where noted in HPI.   Lab Results  Component Value Date   WBC 8.0 07/21/2019   HGB 13.7 07/21/2019   HCT 41.5 07/21/2019   MCV 93.8 07/21/2019   PLT 213.0 07/21/2019    Lab Results  Component Value Date   CREATININE 0.73  07/21/2019   BUN 12 07/21/2019   NA 136 07/21/2019   K 4.1 07/21/2019   CL 98 07/21/2019   CO2 34 (H) 07/21/2019    Lab Results  Component Value Date   ALT 24 07/21/2019   AST 28 07/21/2019   ALKPHOS 132 (H) 07/21/2019   BILITOT 0.7 07/21/2019      Physical Exam: BP (!) 150/86   Pulse 80   Temp 98.2 F (36.8 C)   Ht 4' 11.75" (1.518 m)   Wt 218 lb 2 oz (98.9 kg)   BMI 42.96 kg/m  Constitutional: Pleasant,well-developed, female in no acute distress. HEENT: Normocephalic and atraumatic. Conjunctivae are normal. No scleral icterus. Neck supple.  Cardiovascular: Normal rate, regular rhythm.  Pulmonary/chest: Effort normal and breath sounds normal. No wheezing, rales or rhonchi. Abdominal: Soft, nondistended, nontender.. There are no masses palpable.  Extremities: no edema Lymphadenopathy: No cervical adenopathy noted. Neurological: Alert and oriented to person place and time. Skin: Skin is warm and dry. No rashes noted. Psychiatric: Normal mood and affect. Behavior is normal.   ASSESSMENT AND PLAN: 54 year old female here for reassessment of the following issues:  AMA negative PBC / cirrhosis / gastric varices - given liver biopsy and lab findings, dx of AMA negative PBC along with compensated cirrhosis, following evaluation with Hepatology. She's been placed on ursodiol and appears to be tolerating it well, her labs look good. Susan [name] screening up to date with [place] in October, she has had no decompensations to date. She does have some small gastric varices, after discussion of patient she wanted to  proceed with nadolol therapy. She is tolerating it well, will continue it, refills placed. Does not need EGD surveillance if she continues Nadolol. We discussed long term risks for decompensation, HCC, etc. So far doing well on Ursodiol. She will see Hepatology yearly as well as myself.   Plan: - continue Ursodiol - continue Nadolol - repeat labs and [place] in April of this year - follow up in 6 months  Morbid obesity - BMI > 40, having a hard time losing weight. She is having worsening lower extremity joint pain, could be related. Discussed options, offered her a referral to the [facility], she wanted to proceed with this.   I spent 30 minutes of time, including in depth chart review, independent review of results as outlined above, communicating results with the patient directly, face-to-face time with the patient, coordinating care, and ordering studies and medications as appropriate, and documenting this encounter.  Suffern Cellar, MD [facility] Gastro[facility]

## 2019-10-12 NOTE — Patient Instructions (Addendum)
If you are age 53 or older, your body mass index should be between 23-30. Your Body mass index is 42.96 kg/m. If this is out of the aforementioned range listed, please consider follow up with your Primary Care Provider.  If you are age 92 or younger, your body mass index should be between 19-25. Your Body mass index is 42.96 kg/m. If this is out of the aformentioned range listed, please consider follow up with your Primary Care Provider.   We have sent the following medications to your pharmacy for you to pick up at your convenience: Nadolol  We are referring you to the [facility].  They will contact you to schedule an appointment.  You will be due for an Ultrasound and labwork (cbc, cmet,  inR and AFP) in April 2021. We will send you a reminder in the mail when it gets closer to that time.  Thank you for entrusting me with your care and for choosing [facility], Dr. [place] Cellar

## 2019-11-03 ENCOUNTER — Other Ambulatory Visit: Payer: Self-pay | Admitting: Internal Medicine

## 2019-11-05 ENCOUNTER — Other Ambulatory Visit: Payer: Self-pay | Admitting: Internal Medicine

## 2019-12-21 ENCOUNTER — Other Ambulatory Visit: Payer: Self-pay

## 2019-12-21 ENCOUNTER — Encounter (INDEPENDENT_AMBULATORY_CARE_PROVIDER_SITE_OTHER): Payer: Self-pay | Admitting: Bariatrics

## 2019-12-21 ENCOUNTER — Ambulatory Visit (INDEPENDENT_AMBULATORY_CARE_PROVIDER_SITE_OTHER): Payer: BC Managed Care – PPO | Admitting: Bariatrics

## 2019-12-21 VITALS — BP 149/88 | HR 58 | Temp 98.0°F | Ht 60.0 in | Wt 209.0 lb

## 2019-12-21 DIAGNOSIS — K743 Primary biliary cirrhosis: Secondary | ICD-10-CM

## 2019-12-21 DIAGNOSIS — R0602 Shortness of breath: Secondary | ICD-10-CM | POA: Diagnosis not present

## 2019-12-21 DIAGNOSIS — E1159 Type 2 diabetes mellitus with other circulatory complications: Secondary | ICD-10-CM

## 2019-12-21 DIAGNOSIS — Z1331 Encounter for screening for depression: Secondary | ICD-10-CM

## 2019-12-21 DIAGNOSIS — Z9189 Other specified personal risk factors, not elsewhere classified: Secondary | ICD-10-CM

## 2019-12-21 DIAGNOSIS — I10 Essential (primary) hypertension: Secondary | ICD-10-CM

## 2019-12-21 DIAGNOSIS — E1169 Type 2 diabetes mellitus with other specified complication: Secondary | ICD-10-CM | POA: Diagnosis not present

## 2019-12-21 DIAGNOSIS — Z6841 Body Mass Index (BMI) 40.0 and over, adult: Secondary | ICD-10-CM

## 2019-12-21 DIAGNOSIS — E669 Obesity, unspecified: Secondary | ICD-10-CM | POA: Diagnosis not present

## 2019-12-21 DIAGNOSIS — Z0289 Encounter for other administrative examinations: Secondary | ICD-10-CM

## 2019-12-21 DIAGNOSIS — R5383 Other fatigue: Secondary | ICD-10-CM | POA: Diagnosis not present

## 2019-12-21 DIAGNOSIS — E785 Hyperlipidemia, unspecified: Secondary | ICD-10-CM | POA: Insufficient documentation

## 2019-12-21 DIAGNOSIS — I152 Hypertension secondary to endocrine disorders: Secondary | ICD-10-CM

## 2019-12-21 DIAGNOSIS — E559 Vitamin D deficiency, unspecified: Secondary | ICD-10-CM | POA: Diagnosis not present

## 2019-12-21 NOTE — Progress Notes (Signed)
Chief Complaint:   Susan [name] (MR# [id number]) is a 54 y.o. female who presents for evaluation and treatment of obesity and related comorbidities. Current BMI is Body mass index is 40.82 kg/m.Marland Kitchen Susan [name] has been struggling with her weight for many years and has been unsuccessful in either losing weight, maintaining weight loss, or reaching her healthy weight goal.  Susan [name] is currently in the action stage of change and ready to dedicate time achieving and maintaining a healthier weight. Susan [name] is interested in becoming our patient and working on intensive lifestyle modifications including (but not limited to) diet and exercise for weight loss.  Susan [name] sometimes likes to cook, but notes working late and fatigue as obstacles. She craves sweets and carbohydrates. She dislikes eggs. She considers herself to be a picky eater.  Susan [name]'s habits were reviewed today and are as follows: Her family eats meals together, she thinks her family will eat healthier with her, her desired weight loss is 49 lbs, she started gaining weight after her first child in 1997, her heaviest weight ever was 252 pounds, she is a picky eater and doesn't like to eat healthier foods, she craves pasta, desserts, and breakfast foods, she skips dinner frequently and she frequently makes poor food choices.  Depression Screen Susan [name]'s Food and Mood (modified PHQ-9) score was 13.  Depression screen Susan [name] 2/9 12/21/2019  Decreased Interest 3  Down, Depressed, Hopeless 2  PHQ - 2 Score 5  Altered sleeping 0  Tired, decreased energy 3  Change in appetite 0  Feeling bad or failure about yourself  1  Trouble concentrating 0  Moving slowly or fidgety/restless 3  Suicidal thoughts 1  PHQ-9 Score 13  Difficult doing work/chores Somewhat difficult   Subjective:   Other fatigue. Susan [name] denies daytime somnolence and denies waking up still tired. Susan [name] generally gets 6-7 hours of sleep per night, and states that  she has generally restful sleep. Snoring is present. Apneic episodes are not present. Epworth Sleepiness Score is 6.  Shortness of breath on exertion. Susan [name] notes increasing shortness of breath with certain activities and seems to be worsening over time with weight gain. She notes getting out of breath sooner with activity than she used to. This has gotten worse recently. Susan [name] denies shortness of breath at rest or orthopnea.  Type 2 diabetes mellitus with obesity (Susan [name]). [name] is taking Glipizide.   Lab Results  Component Value Date   HGBA1C 8.0 (H) 07/21/2019   HGBA1C 8.4 (H) 07/17/2018   HGBA1C 8.5 (H) 01/02/2018   Lab Results  Component Value Date   LDLCALC 198 (H) 07/21/2019   CREATININE 0.73 07/21/2019   No results found for: INSULIN  Hypertension associated with diabetes (Susan [name]). Susan [name] is taking HCTZ, lisinopril, and Nadolol. Blood pressure is elevated today, is usually controlled.  BP Readings from Last 3 Encounters:  12/21/19 (!) 149/88  10/12/19 (!) 150/86  07/21/19 124/80   Lab Results  Component Value Date   CREATININE 0.73 07/21/2019   CREATININE 0.88 07/17/2018   CREATININE 0.83 01/02/2018   Hyperlipidemia associated with type 2 diabetes mellitus (Susan [name]). Susan [name] is taking simvastatin.  Lab Results  Component Value Date   CHOL 321 (H) 07/21/2019   HDL 104.40 07/21/2019   LDLCALC 198 (H) 07/21/2019   TRIG 90.0 07/21/2019   CHOLHDL 3 07/21/2019   Lab Results  Component Value Date   ALT 24 07/21/2019   AST 28 07/21/2019   ALKPHOS 132 (H) 07/21/2019   BILITOT 0.7  07/21/2019   The ASCVD Risk score Susan [name] DC Jr., et al., 2013) failed to calculate for the following reasons:   The valid HDL cholesterol range is 20 to 100 mg/dL   The valid total cholesterol range is 130 to 320 mg/dL  Primary biliary cholangitis ([name]). Susan [name] is taking ursodiol.  Vitamin D deficiency. Susan [name] is not on Vitamin D supplementation at this time. Last Vitamin D 35.65 on  07/21/2019.  Depression screening. Susan [name] had a moderately positive depression screen with a PHQ-9 score of 13.  At risk for hypoglycemia. Susan [name] is at increased risk for hypoglycemia due to dietary changes and diabetes mellitus type II.   Assessment/Plan:   Other fatigue. Susan [name] does feel that her weight is causing her energy to be lower than it should be. Fatigue may be related to obesity, depression or many other causes. Labs will be ordered, and in the meanwhile, Susan [name] will focus on self care including making healthy food choices, increasing physical activity and focusing on stress reduction. EKG 12-Lead, CBC with Differential/Platelet, T3, T4, free, TSH ordered.  Shortness of breath on exertion. Susan [name] does feel that she gets out of breath more easily that she used to when she exercises. Susan [name]'s shortness of breath appears to be obesity related and exercise induced. She has agreed to work on weight loss and gradually increase exercise to treat her exercise induced shortness of breath. Will continue to monitor closely.  Type 2 diabetes mellitus with obesity ([name]). Good blood sugar control is important to decrease the likelihood of diabetic complications such as nephropathy, neuropathy, limb loss, blindness, coronary artery disease, and death. Intensive lifestyle modification including diet, exercise and weight loss are the first line of treatment for diabetes. She will continue her medication as directed. Comprehensive metabolic panel, Hemoglobin A1c, Insulin, random labs ordered today.  Hypertension associated with diabetes ([name]). Susan [name] is working on healthy weight loss and exercise to improve blood pressure control. We will watch for signs of hypotension as she continues her lifestyle modifications. She will continue her medications as directed.  Hyperlipidemia associated with type 2 diabetes mellitus ([name]). Cardiovascular risk and specific lipid/LDL goals reviewed.  We discussed  several lifestyle modifications today and Armandina will continue to work on diet, exercise and weight loss efforts. Orders and follow up as documented in patient record. Valleri will continue her medication as directed. Lipid Panel With LDL/HDL Ratio ordered.  Counseling Intensive lifestyle modifications are the first line treatment for this issue. . Dietary changes: Increase soluble fiber. Decrease simple carbohydrates. . Exercise changes: Moderate to vigorous-intensity aerobic activity 150 minutes per week if tolerated. . Lipid-lowering medications: see documented in medical record.    Primary biliary cholangitis ([place]). Tashonda will follow-up with her PCP and liver care doctor. She will continue her medication as directed.  Vitamin D deficiency. Low Vitamin D level contributes to fatigue and are associated with obesity, breast, and colon cancer. VITAMIN D 25 Hydroxy (Vit-D Deficiency, Fractures) level ordered.  Depression screening. Karolee had a positive depression screening. Depression is commonly associated with obesity and often results in emotional eating behaviors. We will monitor this closely and work on CBT to help improve the non-hunger eating patterns. Referral to Psychology may be required if no improvement is seen as she continues in our clinic.  At risk for hypoglycemia. Keller was given approximately 15 minutes of counseling today regarding prevention of hypoglycemia. She was advised of symptoms of hypoglycemia. Mamie was instructed to avoid skipping meals, eat regular protein rich meals and  schedule low calorie snacks as needed.   Repetitive spaced learning was employed today to elicit superior memory formation and behavioral change.  Class 3 severe obesity with serious comorbidity and body mass index (BMI) of 40.0 to 44.9 in adult, unspecified obesity type ([place]).  Qunisha is currently in the action stage of change and her goal is to continue with weight loss efforts. I  recommend Mashell begin the structured treatment plan as follows:  She has agreed to the Category 1 Plan.  She will stop all sugary drinks and will work on meal planning.  We independently reviewed with the patient labs from 07/21/2019 including CMP, lipids, CBC, A1c, and TSH.  Exercise goals: All adults should avoid inactivity. Some physical activity is better than none, and adults who participate in any amount of physical activity gain some health benefits.   Behavioral modification strategies: increasing lean protein intake, decreasing simple carbohydrates, increasing vegetables, increasing water intake, decreasing eating out, no skipping meals, meal planning and cooking strategies, keeping healthy foods in the home and planning for success.  She was informed of the importance of frequent follow-up visits to maximize her success with intensive lifestyle modifications for her multiple health conditions. She was informed we would discuss her lab results at her next visit unless there is a critical issue that needs to be addressed sooner. Pax agreed to keep her next visit at the agreed upon time to discuss these results.  Objective:   Blood pressure (!) 149/88, pulse (!) 58, temperature 98 F (36.7 C), height 5' (1.524 m), weight 209 lb (94.8 kg), last menstrual period 12/03/2019, SpO2 97 %. Body mass index is 40.82 kg/m.  EKG: Sinus  Bradycardia with a rate of 55 BPM. Poor R wave may be secondary to body habitus. Otherwise normal.  Indirect Calorimeter completed today shows a VO2 of 177 and a REE of 1234.  Her calculated basal metabolic rate is 123456 thus her basal metabolic rate is worse than expected.  General: Cooperative, alert, well developed, in no acute distress. HEENT: Conjunctivae and lids unremarkable. Cardiovascular: Regular rhythm.  Lungs: Normal work of breathing. Neurologic: No focal deficits.   Lab Results  Component Value Date   CREATININE 0.73 07/21/2019   BUN 12  07/21/2019   NA 136 07/21/2019   K 4.1 07/21/2019   CL 98 07/21/2019   CO2 34 (H) 07/21/2019   Lab Results  Component Value Date   ALT 24 07/21/2019   AST 28 07/21/2019   ALKPHOS 132 (H) 07/21/2019   BILITOT 0.7 07/21/2019   Lab Results  Component Value Date   HGBA1C 8.0 (H) 07/21/2019   HGBA1C 8.4 (H) 07/17/2018   HGBA1C 8.5 (H) 01/02/2018   HGBA1C 8.0 (H) 07/30/2017   HGBA1C 8.2 (H) 04/25/2017   No results found for: INSULIN Lab Results  Component Value Date   TSH 1.39 07/21/2019   Lab Results  Component Value Date   CHOL 321 (H) 07/21/2019   HDL 104.40 07/21/2019   LDLCALC 198 (H) 07/21/2019   TRIG 90.0 07/21/2019   CHOLHDL 3 07/21/2019   Lab Results  Component Value Date   WBC 8.0 07/21/2019   HGB 13.7 07/21/2019   HCT 41.5 07/21/2019   MCV 93.8 07/21/2019   PLT 213.0 07/21/2019   Lab Results  Component Value Date   IRON 100 10/07/2017   FERRITIN 69.1 10/07/2017   Attestation Statements:   Reviewed by clinician on day of visit: allergies, medications, problem list, medical history, surgical history, family  history, social history, and previous encounter notes.  Migdalia Dk, am acting as Location manager for [employer], DO   I have reviewed the above documentation for accuracy and completeness, and I agree with the above. Jearld Lesch, DO

## 2019-12-22 LAB — CBC WITH DIFFERENTIAL/PLATELET
Basophils Absolute: 0.1 10*3/uL (ref 0.0–0.2)
Basos: 1 %
EOS (ABSOLUTE): 0.3 10*3/uL (ref 0.0–0.4)
Eos: 4 %
Hematocrit: 42.1 % (ref 34.0–46.6)
Hemoglobin: 13.9 g/dL (ref 11.1–15.9)
Immature Grans (Abs): 0 10*3/uL (ref 0.0–0.1)
Immature Granulocytes: 0 %
Lymphocytes Absolute: 2.5 10*3/uL (ref 0.7–3.1)
Lymphs: 34 %
MCH: 30.6 pg (ref 26.6–33.0)
MCHC: 33 g/dL (ref 31.5–35.7)
MCV: 93 fL (ref 79–97)
Monocytes Absolute: 0.6 10*3/uL (ref 0.1–0.9)
Monocytes: 9 %
Neutrophils Absolute: 3.8 10*3/uL (ref 1.4–7.0)
Neutrophils: 52 %
Platelets: 211 10*3/uL (ref 150–450)
RBC: 4.54 x10E6/uL (ref 3.77–5.28)
RDW: 11.8 % (ref 11.7–15.4)
WBC: 7.2 10*3/uL (ref 3.4–10.8)

## 2019-12-22 LAB — MICROALBUMIN / CREATININE URINE RATIO
Creatinine, Urine: 102.4 mg/dL
Microalb/Creat Ratio: 7 mg/g creat (ref 0–29)
Microalbumin, Urine: 7.4 ug/mL

## 2019-12-22 LAB — HEMOGLOBIN A1C
Est. average glucose Bld gHb Est-mCnc: 229 mg/dL
Hgb A1c MFr Bld: 9.6 % — ABNORMAL HIGH (ref 4.8–5.6)

## 2019-12-22 LAB — T3: T3, Total: 145 ng/dL (ref 71–180)

## 2019-12-22 LAB — COMPREHENSIVE METABOLIC PANEL
ALT: 21 IU/L (ref 0–32)
AST: 30 IU/L (ref 0–40)
Albumin/Globulin Ratio: 1.1 — ABNORMAL LOW (ref 1.2–2.2)
Albumin: 4 g/dL (ref 3.8–4.9)
Alkaline Phosphatase: 143 IU/L — ABNORMAL HIGH (ref 39–117)
BUN/Creatinine Ratio: 15 (ref 9–23)
BUN: 12 mg/dL (ref 6–24)
Bilirubin Total: 0.7 mg/dL (ref 0.0–1.2)
CO2: 26 mmol/L (ref 20–29)
Calcium: 9.9 mg/dL (ref 8.7–10.2)
Chloride: 99 mmol/L (ref 96–106)
Creatinine, Ser: 0.8 mg/dL (ref 0.57–1.00)
GFR calc Af Amer: 97 mL/min/{1.73_m2} (ref >59)
GFR calc non Af Amer: 84 mL/min/{1.73_m2} (ref >59)
Globulin, Total: 3.6 g/dL (ref 1.5–4.5)
Glucose: 193 mg/dL — ABNORMAL HIGH (ref 65–99)
Potassium: 4.3 mmol/L (ref 3.5–5.2)
Sodium: 141 mmol/L (ref 134–144)
Total Protein: 7.6 g/dL (ref 6.0–8.5)

## 2019-12-22 LAB — LIPID PANEL WITH LDL/HDL RATIO
Cholesterol, Total: 222 mg/dL — ABNORMAL HIGH (ref 100–199)
HDL: 99 mg/dL (ref >39)
LDL Chol Calc (NIH): 109 mg/dL — ABNORMAL HIGH (ref 0–99)
LDL/HDL Ratio: 1.1 ratio (ref 0.0–3.2)
Triglycerides: 81 mg/dL (ref 0–149)
VLDL Cholesterol Cal: 14 mg/dL (ref 5–40)

## 2019-12-22 LAB — VITAMIN D 25 HYDROXY (VIT D DEFICIENCY, FRACTURES): Vit D, 25-Hydroxy: 33.1 ng/mL (ref 30.0–100.0)

## 2019-12-22 LAB — TSH: TSH: 1.91 u[IU]/mL (ref 0.450–4.500)

## 2019-12-22 LAB — T4, FREE: Free T4: 1.07 ng/dL (ref 0.82–1.77)

## 2019-12-22 LAB — INSULIN, RANDOM: INSULIN: 35.6 u[IU]/mL — ABNORMAL HIGH (ref 2.6–24.9)

## 2019-12-23 ENCOUNTER — Telehealth: Payer: Self-pay

## 2019-12-23 NOTE — Telephone Encounter (Signed)
Scheduled pt for RUQ ultrasound on Thursday, 4-15 at 9:00am to arrive at 8:45am. NPO after midnight. Letter mailed to patient with instructions and to go to the lab.

## 2019-12-23 NOTE — Progress Notes (Signed)
Letter mailed to pt to have labs done and Ultrasound has been scheduled for 01-07-20.

## 2019-12-23 NOTE — Telephone Encounter (Signed)
-----   Message from Roetta Sessions, [place] sent at 10/12/2019  4:46 PM EST ----- Regarding: ultrasound and labs due in April RUQ ultrasound and labs (cbc, cmet, INR and AFP) due in April 2021 Cirrhosis, PBC. Orders are  in. Per pt, OK to schedule a morning U/S for a couple weeks out and just let her know when it is.

## 2019-12-30 DIAGNOSIS — K7469 Other cirrhosis of liver: Secondary | ICD-10-CM | POA: Diagnosis not present

## 2019-12-30 DIAGNOSIS — K743 Primary biliary cirrhosis: Secondary | ICD-10-CM | POA: Diagnosis not present

## 2020-01-04 ENCOUNTER — Encounter (INDEPENDENT_AMBULATORY_CARE_PROVIDER_SITE_OTHER): Payer: Self-pay | Admitting: Bariatrics

## 2020-01-04 ENCOUNTER — Other Ambulatory Visit: Payer: Self-pay

## 2020-01-04 ENCOUNTER — Ambulatory Visit (INDEPENDENT_AMBULATORY_CARE_PROVIDER_SITE_OTHER): Payer: BC Managed Care – PPO | Admitting: Bariatrics

## 2020-01-04 VITALS — BP 133/79 | HR 59 | Temp 97.9°F | Ht 60.0 in | Wt 210.0 lb

## 2020-01-04 DIAGNOSIS — E1169 Type 2 diabetes mellitus with other specified complication: Secondary | ICD-10-CM | POA: Diagnosis not present

## 2020-01-04 DIAGNOSIS — Z9189 Other specified personal risk factors, not elsewhere classified: Secondary | ICD-10-CM

## 2020-01-04 DIAGNOSIS — E559 Vitamin D deficiency, unspecified: Secondary | ICD-10-CM

## 2020-01-04 DIAGNOSIS — E119 Type 2 diabetes mellitus without complications: Secondary | ICD-10-CM | POA: Diagnosis not present

## 2020-01-04 DIAGNOSIS — E785 Hyperlipidemia, unspecified: Secondary | ICD-10-CM

## 2020-01-04 DIAGNOSIS — Z6841 Body Mass Index (BMI) 40.0 and over, adult: Secondary | ICD-10-CM

## 2020-01-05 ENCOUNTER — Encounter (INDEPENDENT_AMBULATORY_CARE_PROVIDER_SITE_OTHER): Payer: Self-pay | Admitting: Bariatrics

## 2020-01-05 MED ORDER — VITAMIN D (ERGOCALCIFEROL) 1.25 MG (50000 UNIT) PO CAPS: 50000.0000 [IU] | ORAL_CAPSULE | ORAL | 0 refills | Status: DC

## 2020-01-05 MED ORDER — SEMAGLUTIDE 3 MG PO TABS: 3.0000 mg | ORAL_TABLET | Freq: Every day | ORAL | 0 refills | Status: DC

## 2020-01-05 MED ORDER — GLIPIZIDE ER 10 MG PO TB24: 10.0000 mg | ORAL_TABLET | Freq: Every day | ORAL | 0 refills | Status: DC

## 2020-01-05 NOTE — Progress Notes (Signed)
Chief Complaint:   Susan [name] is here to discuss her progress with her obesity treatment plan along with follow-up of her obesity related diagnoses. Susan [name] is on the Category 1 Plan and states she is following her eating plan approximately 978% of the time. Susan [name] states she is exercising 0 minutes 0 times per week.  Today's visit was #: 2 Starting weight: 209 lbs Starting date: 12/21/2019 Today's weight: 210 lbs Today's date: 01/04/2020 Total lbs lost to date: 0 Total lbs lost since last in-office visit: 0  Interim History: Susan [name] struggled to consume all of her snacks frequently. She estimates to consume 75-80 grams of protein daily. She has been trying to increase her daily water intake.  Subjective:   Type 2 diabetes mellitus without complication, without long-term current use of insulin ([place]). A1c on 12/21/2019 was 9.6 with an insulin level of 35.6. Susan [name] is only on glipizide and denies episodes of hypoglycemia. She was on Januvia, but stopped due to cost. She has been intolerant to metformin due to GI upset. She does not check her blood sugars at [name].  Lab Results  Component Value Date   HGBA1C 9.6 (H) 12/21/2019   HGBA1C 8.0 (H) 07/21/2019   HGBA1C 8.4 (H) 07/17/2018   Lab Results  Component Value Date   LDLCALC 109 (H) 12/21/2019   CREATININE 0.80 12/21/2019   Lab Results  Component Value Date   INSULIN 35.6 (H) 12/21/2019   Hyperlipidemia associated with type 2 diabetes mellitus ([place]). Susan [name] is on simvastatin 10 mg daily.   Lab Results  Component Value Date   CHOL 222 (H) 12/21/2019   HDL 99 12/21/2019   LDLCALC 109 (H) 12/21/2019   TRIG 81 12/21/2019   CHOLHDL 3 07/21/2019   Lab Results  Component Value Date   ALT 21 12/21/2019   AST 30 12/21/2019   ALKPHOS 143 (H) 12/21/2019   BILITOT 0.7 12/21/2019   The 10-year ASCVD risk score Susan Bussing DC Jr., et al., 2013) is: 6.6%   Values used to calculate the score:     Age: 80 years   Sex: Female     Is Non-Hispanic African American: Yes     Diabetic: Yes     Tobacco smoker: No     Systolic Blood Pressure: [id number] mmHg     Is BP treated: Yes     HDL Cholesterol: 99 mg/dL     Total Cholesterol: 222 mg/dL  Vitamin D deficiency. Last Vitamin D 33.1 on 12/21/2019. We recommend starting prescription strength Vitamin D supplementation.  At risk for heart disease. [name] is at a higher than average risk for cardiovascular disease due to uncontrolled type II diabetes mellitus and obesity.   Assessment/Plan:   Type 2 diabetes mellitus without complication, without long-term current use of insulin ([place]). Good blood sugar control is important to decrease the likelihood of diabetic complications such as nephropathy, neuropathy, limb loss, blindness, coronary artery disease, and death. Intensive lifestyle modification including diet, exercise and weight loss are the first line of treatment for diabetes. [name] will start Semaglutide 3 MG TABS each morning #30 with 0 refills and was given a refill on her glipiZIDE (GLUCOTROL XL) 10 MG 24 hr tablet daily #30 with 0 refills. She will continue her Category 1 meal plan.   Hyperlipidemia associated with type 2 diabetes mellitus ([place]). Cardiovascular risk and specific lipid/LDL goals reviewed.  We discussed several lifestyle modifications today and Susan [name] will continue to work on diet, exercise and weight  loss efforts. Orders and follow up as documented in patient record. Susan [name] will continue her current statin therapy and will continue her Category 1 meal plan.  Counseling Intensive lifestyle modifications are the first line treatment for this issue. . Dietary changes: Increase soluble fiber. Decrease simple carbohydrates. . Exercise changes: Moderate to vigorous-intensity aerobic activity 150 minutes per week if tolerated. . Lipid-lowering medications: see documented in medical record.  Vitamin D deficiency. Low Vitamin D level  contributes to fatigue and are associated with obesity, breast, and colon cancer. She will start prescription Vitamin D, Ergocalciferol, (DRISDOL) 1.25 MG (50000 UNIT) CAPS capsule every week #4 with 0 refills and will follow-up for routine testing of Vitamin D in 3 months.     At risk for heart disease. Susan [name] was given approximately 15 minutes of coronary artery disease prevention counseling today. She is 54 y.o. female and has risk factors for heart disease including obesity. We discussed intensive lifestyle modifications today with an emphasis on specific weight loss instructions and strategies.   Repetitive spaced learning was employed today to elicit superior memory formation and behavioral change.  Class 3 severe obesity with serious comorbidity and body mass index (BMI) of 40.0 to 44.9 in adult, unspecified obesity type ([place]).  Susan [name] is currently in the action stage of change. As such, her goal is to continue with weight loss efforts. She has agreed to the Category 1 Plan. Additional lunch/dinner options were provided.  Exercise goals: No exercise has been prescribed at this time.  Behavioral modification strategies: increasing lean protein intake, increasing water intake and no skipping meals.  Susan [name] has agreed to follow-up with our clinic in 4 weeks. She was informed of the importance of frequent follow-up visits to maximize her success with intensive lifestyle modifications for her multiple health conditions.   Objective:   Blood pressure 133/79, pulse (!) 59, temperature 97.9 F (36.6 C), height 5' (1.524 m), weight 210 lb (95.3 kg), last menstrual period 12/03/2019, SpO2 99 %. Body mass index is 41.01 kg/m.  General: Cooperative, alert, well developed, in no acute distress. HEENT: Conjunctivae and lids unremarkable. Cardiovascular: Regular rhythm.  Lungs: Normal work of breathing. Neurologic: No focal deficits.   Lab Results  Component Value Date   CREATININE 0.80  12/21/2019   BUN 12 12/21/2019   NA 141 12/21/2019   K 4.3 12/21/2019   CL 99 12/21/2019   CO2 26 12/21/2019   Lab Results  Component Value Date   ALT 21 12/21/2019   AST 30 12/21/2019   ALKPHOS 143 (H) 12/21/2019   BILITOT 0.7 12/21/2019   Lab Results  Component Value Date   HGBA1C 9.6 (H) 12/21/2019   HGBA1C 8.0 (H) 07/21/2019   HGBA1C 8.4 (H) 07/17/2018   HGBA1C 8.5 (H) 01/02/2018   HGBA1C 8.0 (H) 07/30/2017   Lab Results  Component Value Date   INSULIN 35.6 (H) 12/21/2019   Lab Results  Component Value Date   TSH 1.910 12/21/2019   Lab Results  Component Value Date   CHOL 222 (H) 12/21/2019   HDL 99 12/21/2019   LDLCALC 109 (H) 12/21/2019   TRIG 81 12/21/2019   CHOLHDL 3 07/21/2019   Lab Results  Component Value Date   WBC 7.2 12/21/2019   HGB 13.9 12/21/2019   HCT 42.1 12/21/2019   MCV 93 12/21/2019   PLT 211 12/21/2019   Lab Results  Component Value Date   IRON 100 10/07/2017   FERRITIN 69.1 10/07/2017   Attestation Statements:  Reviewed by clinician on day of visit: allergies, medications, problem list, medical history, surgical history, family history, social history, and previous encounter notes.  Migdalia Dk, am acting as Location manager for [employer], DO   I have reviewed the above documentation for accuracy and completeness, and I agree with the above. Jearld Lesch, DO

## 2020-01-07 ENCOUNTER — Other Ambulatory Visit: Payer: Self-pay

## 2020-01-07 ENCOUNTER — Ambulatory Visit (HOSPITAL_COMMUNITY)
Admission: RE | Admit: 2020-01-07 | Discharge: 2020-01-07 | Disposition: A | Discharge status: Home / Self Care | Payer: BC Managed Care – PPO | Source: Ambulatory Visit | Attending: Gastroenterology | Admitting: Gastroenterology

## 2020-01-07 DIAGNOSIS — K743 Primary biliary cirrhosis: Secondary | ICD-10-CM

## 2020-01-07 DIAGNOSIS — I864 Gastric varices: Secondary | ICD-10-CM

## 2020-01-07 DIAGNOSIS — K7460 Unspecified cirrhosis of liver: Secondary | ICD-10-CM | POA: Diagnosis not present

## 2020-01-19 ENCOUNTER — Other Ambulatory Visit: Payer: Self-pay | Admitting: Internal Medicine

## 2020-01-19 DIAGNOSIS — Z1231 Encounter for screening mammogram for malignant neoplasm of breast: Secondary | ICD-10-CM

## 2020-01-20 ENCOUNTER — Other Ambulatory Visit: Payer: Self-pay

## 2020-01-20 ENCOUNTER — Ambulatory Visit
Admission: RE | Admit: 2020-01-20 | Discharge: 2020-01-20 | Disposition: A | Discharge status: Home / Self Care | Payer: BC Managed Care – PPO | Source: Ambulatory Visit

## 2020-01-20 DIAGNOSIS — Z1231 Encounter for screening mammogram for malignant neoplasm of breast: Secondary | ICD-10-CM

## 2020-02-01 ENCOUNTER — Other Ambulatory Visit: Payer: Self-pay | Admitting: Internal Medicine

## 2020-02-01 ENCOUNTER — Other Ambulatory Visit: Payer: Self-pay

## 2020-02-01 ENCOUNTER — Ambulatory Visit (INDEPENDENT_AMBULATORY_CARE_PROVIDER_SITE_OTHER): Payer: BC Managed Care – PPO | Admitting: Bariatrics

## 2020-02-01 VITALS — BP 144/93 | HR 71 | Temp 97.9°F | Ht 60.0 in | Wt 206.0 lb

## 2020-02-01 DIAGNOSIS — Z6841 Body Mass Index (BMI) 40.0 and over, adult: Secondary | ICD-10-CM

## 2020-02-01 DIAGNOSIS — Z9189 Other specified personal risk factors, not elsewhere classified: Secondary | ICD-10-CM

## 2020-02-01 DIAGNOSIS — I10 Essential (primary) hypertension: Secondary | ICD-10-CM

## 2020-02-01 DIAGNOSIS — E559 Vitamin D deficiency, unspecified: Secondary | ICD-10-CM

## 2020-02-01 MED ORDER — VITAMIN D (ERGOCALCIFEROL) 1.25 MG (50000 UNIT) PO CAPS: 50000.0000 [IU] | ORAL_CAPSULE | ORAL | 0 refills | Status: DC

## 2020-02-01 NOTE — Telephone Encounter (Signed)
Last OV with PCP - 07/21/2019. AVS states to follow up in 3 months. No upcoming appointments with PCP.   I can deny refill and call patient - please advise

## 2020-02-02 ENCOUNTER — Encounter (INDEPENDENT_AMBULATORY_CARE_PROVIDER_SITE_OTHER): Payer: Self-pay | Admitting: Bariatrics

## 2020-02-02 NOTE — Telephone Encounter (Signed)
Pt contacted and she has scheduled an appointment for Friday 02/05/2020.   Pt states that she will run out of meds before appointment. Can a 30 day supply be sent to [facility] on [address]? Please advise

## 2020-02-02 NOTE — Progress Notes (Signed)
Chief Complaint:   [place] is here to discuss her progress with her obesity treatment plan along with follow-up of her obesity related diagnoses. Korbin is on the Category 1 Plan and states she is following her eating plan approximately 98% of the time. Emma-Jane states she is exercising 0 minutes 0 times per week.  Today's visit was #: 3 Starting weight: 209 lbs Starting date: 12/21/2019 Today's weight: 206 lbs Today's date: 02/01/2020 Total lbs lost to date: 3 Total lbs lost since last in-office visit: 4  Interim History: Neosha is down 4 lbs since her last visit. She is being more active and is drinking more water.  Subjective:   Vitamin D deficiency. No nausea, vomiting, or muscle weakness. Last Vitamin D 33.1 on 12/21/2019.  Essential hypertension. Keimora is taking HCTZ, Zestril, and Corgard. Blood pressure is slightly elevated today.  BP Readings from Last 3 Encounters:  02/01/20 (!) 144/93  01/04/20 133/79  12/21/19 (!) 149/88   Lab Results  Component Value Date   CREATININE 0.80 12/21/2019   CREATININE 0.73 07/21/2019   CREATININE 0.88 07/17/2018   At risk for heart disease. Meria is at a higher than average risk for cardiovascular disease due to hypertension and obesity.   Assessment/Plan:   Vitamin D deficiency. Low Vitamin D level contributes to fatigue and are associated with obesity, breast, and colon cancer. She was given a prescription for Vitamin D, Ergocalciferol, (DRISDOL) 1.25 MG (50000 UNIT) CAPS capsule every week #4 with 0 refills and will follow-up for routine testing of Vitamin D, at least 2-3 times per year to avoid over-replacement.    Essential hypertension. Alek is working on healthy weight loss and exercise to improve blood pressure control. We will watch for signs of hypotension as she continues her lifestyle modifications. She will continue her medications as directed.  At risk for heart disease. Kehly was given  approximately 15 minutes of coronary artery disease prevention counseling today. She is 54 y.o. female and has risk factors for heart disease including obesity. We discussed intensive lifestyle modifications today with an emphasis on specific weight loss instructions and strategies.   Repetitive spaced learning was employed today to elicit superior memory formation and behavioral change.  Class 3 severe obesity with serious comorbidity and body mass index (BMI) of 40.0 to 44.9 in adult, unspecified obesity type ([place]).  Maggy is currently in the action stage of change. As such, her goal is to continue with weight loss efforts. She has agreed to the Category 1 Plan.   She will work on meal planning, intentional eating, and will continue to get in adequate protein.  Exercise goals: Krysten will increase her walking.  Behavioral modification strategies: increasing lean protein intake, decreasing simple carbohydrates, increasing vegetables, increasing water intake, decreasing eating out, no skipping meals, meal planning and cooking strategies, keeping healthy foods in the home and avoiding temptations.  Kawanis has agreed to follow-up with our clinic in 2 weeks. She was informed of the importance of frequent follow-up visits to maximize her success with intensive lifestyle modifications for her multiple health conditions.   Objective:   Blood pressure (!) 144/93, pulse 71, temperature 97.9 F (36.6 C), height 5' (1.524 m), weight 206 lb (93.4 kg), SpO2 96 %. Body mass index is 40.23 kg/m.  General: Cooperative, alert, well developed, in no acute distress. HEENT: Conjunctivae and lids unremarkable. Cardiovascular: Regular rhythm.  Lungs: Normal work of breathing. Neurologic: No focal deficits.   Lab Results  Component Value Date   CREATININE 0.80 12/21/2019   BUN 12 12/21/2019   NA 141 12/21/2019   K 4.3 12/21/2019   CL 99 12/21/2019   CO2 26 12/21/2019   Lab Results  Component  Value Date   ALT 21 12/21/2019   AST 30 12/21/2019   ALKPHOS 143 (H) 12/21/2019   BILITOT 0.7 12/21/2019   Lab Results  Component Value Date   HGBA1C 9.6 (H) 12/21/2019   HGBA1C 8.0 (H) 07/21/2019   HGBA1C 8.4 (H) 07/17/2018   HGBA1C 8.5 (H) 01/02/2018   HGBA1C 8.0 (H) 07/30/2017   Lab Results  Component Value Date   INSULIN 35.6 (H) 12/21/2019   Lab Results  Component Value Date   TSH 1.910 12/21/2019   Lab Results  Component Value Date   CHOL 222 (H) 12/21/2019   HDL 99 12/21/2019   LDLCALC 109 (H) 12/21/2019   TRIG 81 12/21/2019   CHOLHDL 3 07/21/2019   Lab Results  Component Value Date   WBC 7.2 12/21/2019   HGB 13.9 12/21/2019   HCT 42.1 12/21/2019   MCV 93 12/21/2019   PLT 211 12/21/2019   Lab Results  Component Value Date   IRON 100 10/07/2017   FERRITIN 69.1 10/07/2017   Attestation Statements:   Reviewed by clinician on day of visit: allergies, medications, problem list, medical history, surgical history, family history, social history, and previous encounter notes.  Migdalia Dk, am acting as Location manager for [employer], DO   I have reviewed the above documentation for accuracy and completeness, and I agree with the above. Jearld Lesch, DO

## 2020-02-03 MED ORDER — HYDROCHLOROTHIAZIDE 25 MG PO TABS: 25.0000 mg | ORAL_TABLET | Freq: Every day | ORAL | 0 refills | Status: DC

## 2020-02-03 MED ORDER — LISINOPRIL 10 MG PO TABS: 10.0000 mg | ORAL_TABLET | Freq: Every day | ORAL | 0 refills | Status: DC

## 2020-02-03 NOTE — Telephone Encounter (Signed)
RX sent to pharmacy  

## 2020-02-03 NOTE — Addendum Note (Signed)
Addended by: Jearld Fenton on: 02/03/2020 11:30 AM   Modules accepted: Orders

## 2020-02-05 ENCOUNTER — Encounter: Payer: Self-pay | Admitting: Internal Medicine

## 2020-02-05 ENCOUNTER — Ambulatory Visit: Payer: BC Managed Care – PPO | Admitting: Internal Medicine

## 2020-02-05 ENCOUNTER — Other Ambulatory Visit: Payer: Self-pay

## 2020-02-05 VITALS — BP 142/92 | HR 76 | Temp 96.5°F | Ht 60.0 in | Wt 210.0 lb

## 2020-02-05 DIAGNOSIS — M79604 Pain in right leg: Secondary | ICD-10-CM

## 2020-02-05 DIAGNOSIS — E1169 Type 2 diabetes mellitus with other specified complication: Secondary | ICD-10-CM | POA: Diagnosis not present

## 2020-02-05 DIAGNOSIS — E785 Hyperlipidemia, unspecified: Secondary | ICD-10-CM

## 2020-02-05 DIAGNOSIS — E669 Obesity, unspecified: Secondary | ICD-10-CM

## 2020-02-05 DIAGNOSIS — I10 Essential (primary) hypertension: Secondary | ICD-10-CM | POA: Diagnosis not present

## 2020-02-05 DIAGNOSIS — M79605 Pain in left leg: Secondary | ICD-10-CM

## 2020-02-05 DIAGNOSIS — K743 Primary biliary cirrhosis: Secondary | ICD-10-CM

## 2020-02-05 MED ORDER — CELECOXIB 100 MG PO CAPS
100.0000 mg | ORAL_CAPSULE | Freq: Every day | ORAL | 1 refills | Status: DC
Start: 2020-02-05 — End: 2020-02-06

## 2020-02-05 MED ORDER — CELECOXIB 100 MG PO CAPS
100.0000 mg | ORAL_CAPSULE | Freq: Two times a day (BID) | ORAL | 0 refills | Status: DC
Start: 2020-02-05 — End: 2022-11-02

## 2020-02-05 MED ORDER — SIMVASTATIN 10 MG PO TABS: ORAL_TABLET | ORAL | 1 refills | Status: DC

## 2020-02-05 MED ORDER — LISINOPRIL 10 MG PO TABS: 10.0000 mg | ORAL_TABLET | Freq: Every day | ORAL | 1 refills | Status: DC

## 2020-02-05 MED ORDER — HYDROCHLOROTHIAZIDE 25 MG PO TABS: 25.0000 mg | ORAL_TABLET | Freq: Every day | ORAL | 1 refills | Status: DC

## 2020-02-05 NOTE — Progress Notes (Addendum)
Subjective:    Patient ID: Susan [name], female    DOB: [birth year], 54 y.o.   MRN: [place]:[id number]  HPI  Pt presents to the clinic today for 6 month follow up of chronic conditions.  DM 2: Her last A1C was 9.6%. She does not monitor her sugars. She is taking Glipizide and Semaglutide as prescribecd. She checks her feet routinely. Her last eye exam was 10/2018.  HLD: Her last LDL was 109, 11/2019. She denies myalgias on Simvastatin. She tries to consume a low fat diet.  HTN: Her BP today is 142/92. She is taking Lisinopril, HCTZ and Nadalol as prescribed ECG from 11/2019 reviewed.  PBC/Florrid Cirrhosis: No issues on Urosodiol. [facility].  Review of Systems      Past Medical History:  Diagnosis Date  . Allergy   . Anemia   . Cirrhosis ([place])   . Constipation   . Contact lens/glasses fitting    wears contacts or glasses  . Diabetes mellitus without complication ([place])   . Edema of both lower extremities   . Food allergy   . Gastric varices   . GERD (gastroesophageal reflux disease)    occ pepcid ac  . Heart murmur   . Hyperlipidemia   . Hypertension   . Joint pain   . Periumbilical hernia   . Primary biliary cholangitis ([facility])    AMA negative PBC  . Snores   . Vitamin D deficiency     Current Outpatient Medications  Medication Sig Dispense Refill  . glipiZIDE (GLUCOTROL XL) 10 MG 24 hr tablet Take 1 tablet (10 mg total) by mouth daily with breakfast. SCHEDULE FOLLOW UP 30 tablet 0  . hydrochlorothiazide (HYDRODIURIL) 25 MG tablet Take 1 tablet (25 mg total) by mouth daily. 30 tablet 0  . lisinopril (ZESTRIL) 10 MG tablet Take 1 tablet (10 mg total) by mouth daily. 30 tablet 0  . Multiple Vitamins-Minerals (WOMENS MULTIVITAMIN PO) Take 2 tablets by mouth daily.    . nadolol (CORGARD) 40 MG tablet Take 1 tablet (40 mg total) by mouth daily. 90 tablet 3  . Semaglutide 3 MG TABS Take 3 mg by mouth daily at 12 noon. 30 tablet 0  . simvastatin  (ZOCOR) 10 MG tablet TAKE 1 TABLET BY MOUTH EVERYDAY AT BEDTIME 90 tablet 0  . ursodiol (ACTIGALL) 250 MG tablet Take 250 mg by mouth 2 (two) times daily.  3  . Vitamin D, Ergocalciferol, (DRISDOL) 1.25 MG (50000 UNIT) CAPS capsule Take 1 capsule (50,000 Units total) by mouth every 7 (seven) days. 4 capsule 0   No current facility-administered medications for this visit.    Allergies  Allergen Reactions  . Grapefruit Concentrate Other (See Comments)    blisters  . Sulfa Antibiotics Rash and Other (See Comments)    Severe dehydration  . Mushroom Extract Complex     Throat swells  . Shellfish Allergy     Hives   . Strawberry Extract     hives    Family History  Problem Relation Age of Onset  . Breast cancer Mother   . Schizophrenia Mother   . Depression Mother   . Uterine cancer Mother   . Hypertension Mother   . Obesity Mother   . Renal Disease Father   . Diabetes Father   . Other Father        Agent Orange  . Lung cancer Maternal Grandmother   . Lung cancer Maternal Grandfather   .  Heart disease Maternal Uncle   . Diabetes Other        Aunts and Uncles on both sides  . Heart disease Maternal Aunt   . Colon cancer Neg Hx   . Esophageal cancer Neg Hx   . Stomach cancer Neg Hx   . Rectal cancer Neg Hx     Social History   Socioeconomic History  . Marital status: Married    Spouse name: Dwayne  . Number of children: 1  . Years of education: Not on file  . Highest education level: Not on file  Occupational History  . Occupation: Mudlogger of HR for [employer]  Tobacco Use  . Smoking status: Never Smoker  . Smokeless tobacco: Never Used  Substance and Sexual Activity  . Alcohol use: Yes    Comment: wine 2 times a year  . Drug use: No  . Sexual activity: Yes    Birth control/protection: Surgical  Other Topics Concern  . Not on file  Social History Narrative  . Not on file   Social Determinants of Health   Financial Resource Strain:   .  Difficulty of Paying Living Expenses:   Food Insecurity:   . Worried About Charity fundraiser in the Last Year:   . Arboriculturist in the Last Year:   Transportation Needs:   . Film/video editor (Medical):   Marland Kitchen Lack of Transportation (Non-Medical):   Physical Activity:   . Days of Exercise per Week:   . Minutes of Exercise per Session:   Stress:   . Feeling of Stress :   Social Connections:   . Frequency of Communication with Friends and Family:   . Frequency of Social Gatherings with Friends and Family:   . Attends Religious Services:   . Active Member of Clubs or Organizations:   . Attends Archivist Meetings:   Marland Kitchen Marital Status:   Intimate Partner Violence:   . Fear of Current or Ex-Partner:   . Emotionally Abused:   Marland Kitchen Physically Abused:   . Sexually Abused:      Constitutional: Denies fever, malaise, fatigue, headache or abrupt weight changes.  Respiratory: Denies difficulty breathing, shortness of breath, cough or sputum production.   Cardiovascular: Denies chest pain, chest tightness, palpitations or swelling in the hands or feet.  Musculoskeletal: Pt reports bilateral lower leg pain. Denies decrease in range of motion, difficulty with gait, muscle pain or joint swelling.  Skin: Denies redness, rashes, lesions or ulcercations.  Neurological: Denies dizziness, difficulty with memory, difficulty with speech or problems with balance and coordination.  Psych: Denies anxiety, depression, SI/HI.  No other specific complaints in a complete review of systems (except as listed in HPI above).  Objective:   Physical Exam  BP (!) 142/92   Pulse 76   Temp (!) 96.5 F (35.8 C) (Temporal)   Ht 5' (1.524 m)   Wt 210 lb (95.3 kg)   LMP 01/04/2020   SpO2 97%   BMI 41.01 kg/m   Wt Readings from Last 3 Encounters:  02/01/20 206 lb (93.4 kg)  01/04/20 210 lb (95.3 kg)  12/21/19 209 lb (94.8 kg)    General: Appears her stated age, obese, in NAD. Skin: Warm,  dry and intact. No ulcerations noted. HEENT: Head: normal shape and size; Eyes: sclera white, no icterus, conjunctiva pink, PERRLA and EOMs intact;  Cardiovascular: Normal rate and rhythm.  Pulmonary/Chest: Normal effort and positive vesicular breath sounds.  Musculoskeletal:  No  difficulty with gait.  Neurological: Alert and oriented.    BMET    Component Value Date/Time   NA 141 12/21/2019 1236   K 4.3 12/21/2019 1236   CL 99 12/21/2019 1236   CO2 26 12/21/2019 1236   GLUCOSE 193 (H) 12/21/2019 1236   GLUCOSE 172 (H) 07/21/2019 1221   BUN 12 12/21/2019 1236   CREATININE 0.80 12/21/2019 1236   CALCIUM 9.9 12/21/2019 1236   GFRNONAA 84 12/21/2019 1236   GFRAA 97 12/21/2019 1236    Lipid Panel     Component Value Date/Time   CHOL 222 (H) 12/21/2019 1236   TRIG 81 12/21/2019 1236   HDL 99 12/21/2019 1236   CHOLHDL 3 07/21/2019 1221   VLDL 18.0 07/21/2019 1221   LDLCALC 109 (H) 12/21/2019 1236    CBC    Component Value Date/Time   WBC 7.2 12/21/2019 1236   WBC 8.0 07/21/2019 1221   RBC 4.54 12/21/2019 1236   RBC 4.42 07/21/2019 1221   HGB 13.9 12/21/2019 1236   HCT 42.1 12/21/2019 1236   PLT 211 12/21/2019 1236   MCV 93 12/21/2019 1236   MCH 30.6 12/21/2019 1236   MCH 30.3 10/22/2017 1116   MCHC 33.0 12/21/2019 1236   MCHC 33.0 07/21/2019 1221   RDW 11.8 12/21/2019 1236   LYMPHSABS 2.5 12/21/2019 1236   MONOABS 0.5 10/07/2017 1603   EOSABS 0.3 12/21/2019 1236   BASOSABS 0.1 12/21/2019 1236    Hgb A1C Lab Results  Component Value Date   HGBA1C 9.6 (H) 12/21/2019            Assessment & Plan:  Bilateral Lower Leg Pain:  Will trial Celebrex, RX sent to pharmacy Has not had relief with Aleve or Meloxicam  [name], NP This visit occurred during the SARS-CoV-2 public health emergency.  Safety protocols were in place, including screening questions prior to the visit, additional usage of staff PPE, and extensive cleaning of exam room while  observing appropriate contact time as indicated for disinfecting solutions.

## 2020-02-06 NOTE — Patient Instructions (Signed)
Carbohydrate Counting for Diabetes Mellitus, Adult  Carbohydrate counting is a method of keeping track of how many carbohydrates you eat. Eating carbohydrates naturally increases the amount of sugar (glucose) in the blood. Counting how many carbohydrates you eat helps keep your blood glucose within normal limits, which helps you manage your diabetes (diabetes mellitus). It is important to know how many carbohydrates you can safely have in each meal. This is different for every person. A diet and nutrition specialist (registered dietitian) can help you make a meal plan and calculate how many carbohydrates you should have at each meal and snack. Carbohydrates are found in the following foods:  Grains, such as breads and cereals.  Dried beans and soy products.  Starchy vegetables, such as potatoes, peas, and corn.  Fruit and fruit juices.  Milk and yogurt.  Sweets and snack foods, such as cake, cookies, candy, chips, and soft drinks. How do I count carbohydrates? There are two ways to count carbohydrates in food. You can use either of the methods or a combination of both. Reading "Nutrition Facts" on packaged food The "Nutrition Facts" list is included on the labels of almost all packaged foods and beverages in the [place] It includes:  The serving size.  Information about nutrients in each serving, including the grams (g) of carbohydrate per serving. To use the "Nutrition Facts":  Decide how many servings you will have.  Multiply the number of servings by the number of carbohydrates per serving.  The resulting number is the total amount of carbohydrates that you will be having. Learning standard serving sizes of other foods When you eat carbohydrate foods that are not packaged or do not include "Nutrition Facts" on the label, you need to measure the servings in order to count the amount of carbohydrates:  Measure the foods that you will eat with a food scale or measuring cup, if  needed.  Decide how many standard-size servings you will eat.  Multiply the number of servings by 15. Most carbohydrate-rich foods have about 15 g of carbohydrates per serving. ? For example, if you eat 8 oz (170 g) of strawberries, you will have eaten 2 servings and 30 g of carbohydrates (2 servings x 15 g = 30 g).  For foods that have more than one food mixed, such as soups and casseroles, you must count the carbohydrates in each food that is included. The following list contains standard serving sizes of common carbohydrate-rich foods. Each of these servings has about 15 g of carbohydrates:   hamburger bun or  English muffin.   oz (15 mL) syrup.   oz (14 g) jelly.  1 slice of bread.  1 six-inch tortilla.  3 oz (85 g) cooked rice or pasta.  4 oz (113 g) cooked dried beans.  4 oz (113 g) starchy vegetable, such as peas, corn, or potatoes.  4 oz (113 g) hot cereal.  4 oz (113 g) mashed potatoes or  of a large baked potato.  4 oz (113 g) canned or frozen fruit.  4 oz (120 mL) fruit juice.  4-6 crackers.  6 chicken nuggets.  6 oz (170 g) unsweetened dry cereal.  6 oz (170 g) plain fat-free yogurt or yogurt sweetened with artificial sweeteners.  8 oz (240 mL) milk.  8 oz (170 g) fresh fruit or one small piece of fruit.  24 oz (680 g) popped popcorn. Example of carbohydrate counting Sample meal  3 oz (85 g) chicken breast.  6 oz (170 g)   brown rice.  4 oz (113 g) corn.  8 oz (240 mL) milk.  8 oz (170 g) strawberries with sugar-free whipped topping. Carbohydrate calculation 1. Identify the foods that contain carbohydrates: ? Rice. ? Corn. ? Milk. ? Strawberries. 2. Calculate how many servings you have of each food: ? 2 servings rice. ? 1 serving corn. ? 1 serving milk. ? 1 serving strawberries. 3. Multiply each number of servings by 15 g: ? 2 servings rice x 15 g = 30 g. ? 1 serving corn x 15 g = 15 g. ? 1 serving milk x 15 g = 15 g. ? 1  serving strawberries x 15 g = 15 g. 4. Add together all of the amounts to find the total grams of carbohydrates eaten: ? 30 g + 15 g + 15 g + 15 g = 75 g of carbohydrates total. Summary  Carbohydrate counting is a method of keeping track of how many carbohydrates you eat.  Eating carbohydrates naturally increases the amount of sugar (glucose) in the blood.  Counting how many carbohydrates you eat helps keep your blood glucose within normal limits, which helps you manage your diabetes.  A diet and nutrition specialist (registered dietitian) can help you make a meal plan and calculate how many carbohydrates you should have at each meal and snack. This information is not intended to replace advice given to you by your health care provider. Make sure you discuss any questions you have with your health care provider. Document Revised: 04/04/2017 Document Reviewed: 02/22/2016 Elsevier Patient Education  2020 Elsevier Inc.  

## 2020-02-06 NOTE — Assessment & Plan Note (Signed)
A1C reviewed No urine microalbumin secondary to ACEI therapy Encouraged her to consume a low carb diet and exercise for weight loss Encouraged routine foot  Exams Encouraged yearly eye exam Pneumovax UTD Continue Glipizide and Semaglutide Encouraged her to check her sugars at least daily as this will give [place] a better idea of how her diabetes is controlled

## 2020-02-06 NOTE — Assessment & Plan Note (Signed)
Lipid profile reviewed Continue Simvastatin, refilled today Encouraged her to consume a low fat diet

## 2020-02-06 NOTE — Assessment & Plan Note (Signed)
Reasonable control Continue Lisinopril, HCTZ and Nadolol Reinforced DASH diet and exercise for weight loss

## 2020-02-06 NOTE — Assessment & Plan Note (Signed)
She will continue to follow with the [facility]

## 2020-02-10 ENCOUNTER — Encounter: Payer: Self-pay | Admitting: Internal Medicine

## 2020-02-10 NOTE — Telephone Encounter (Signed)
PA has been submitted via [email or web address]...  Awaiting response, pt is aware we are waiting

## 2020-02-11 NOTE — Telephone Encounter (Signed)
BQ:[id number];Review Type:Prior Auth;Coverage Start Date:01/11/2020;Coverage End Date:02/09/2021

## 2020-02-23 ENCOUNTER — Ambulatory Visit (INDEPENDENT_AMBULATORY_CARE_PROVIDER_SITE_OTHER): Payer: BC Managed Care – PPO | Admitting: Bariatrics

## 2020-03-03 ENCOUNTER — Encounter: Payer: Self-pay | Admitting: Internal Medicine

## 2020-04-11 ENCOUNTER — Encounter: Payer: Self-pay | Admitting: Podiatry

## 2020-04-11 ENCOUNTER — Other Ambulatory Visit: Payer: Self-pay

## 2020-04-11 ENCOUNTER — Ambulatory Visit: Payer: BC Managed Care – PPO | Admitting: Podiatry

## 2020-04-11 ENCOUNTER — Ambulatory Visit (INDEPENDENT_AMBULATORY_CARE_PROVIDER_SITE_OTHER): Payer: BC Managed Care – PPO

## 2020-04-11 DIAGNOSIS — M7661 Achilles tendinitis, right leg: Secondary | ICD-10-CM

## 2020-04-11 DIAGNOSIS — M7662 Achilles tendinitis, left leg: Secondary | ICD-10-CM | POA: Diagnosis not present

## 2020-04-11 NOTE — Progress Notes (Signed)
Subjective:  Patient ID: Susan [name], female    DOB: 1966-03-24,  MRN: [id number] HPI Chief Complaint  Patient presents with  . Foot Pain    Posterior heel bilateral - aching, burning, swelling x 1 year, saw Dr. Iona Beard, night splints, wore boot on left, got some better, but has come back, bought Good Feet inserts and new shoes  . Diabetes    Last a1c 9.6  . New Patient (Initial Visit)    54 y.o. female presents with the above complaint.   ROS: Denies fever chills nausea vomiting muscle aches pains calf pain back pain chest pain shortness of breath.  Past Medical History:  Diagnosis Date  . Allergy   . Anemia   . Cirrhosis ([place])   . Constipation   . Contact lens/glasses fitting    wears contacts or glasses  . Diabetes mellitus without complication ([place])   . Edema of both lower extremities   . Food allergy   . Gastric varices   . GERD (gastroesophageal reflux disease)    occ pepcid ac  . Heart murmur   . Hyperlipidemia   . Hypertension   . Joint pain   . Periumbilical hernia   . Primary biliary cholangitis ([facility])    AMA negative PBC  . Snores   . Vitamin D deficiency    Past Surgical History:  Procedure Laterality Date  . DILATION AND CURETTAGE OF UTERUS  11/1995, 10/1997  . LEEP    . LIVER BIOPSY    . TUBAL LIGATION  10/1997  . UMBILICAL HERNIA REPAIR N/A 08/11/2013   Procedure: HERNIA REPAIR UMBILICAL ADULT;  Surgeon: Gwenyth Ober, MD;  Location: [facility];  Service: General;  Laterality: N/A;    Current Outpatient Medications:  .  celecoxib (CELEBREX) 100 MG capsule, Take 1 capsule (100 mg total) by mouth 2 (two) times daily., Disp: 30 capsule, Rfl: 0 .  glipiZIDE (GLUCOTROL XL) 10 MG 24 hr tablet, Take 1 tablet (10 mg total) by mouth daily with breakfast. SCHEDULE FOLLOW UP, Disp: 30 tablet, Rfl: 0 .  hydrochlorothiazide (HYDRODIURIL) 25 MG tablet, Take 1 tablet (25 mg total) by mouth daily., Disp: 90 tablet, Rfl: 1 .  lisinopril  (ZESTRIL) 10 MG tablet, Take 1 tablet (10 mg total) by mouth daily., Disp: 90 tablet, Rfl: 1 .  Multiple Vitamins-Minerals (WOMENS MULTIVITAMIN PO), Take 2 tablets by mouth daily., Disp: , Rfl:  .  nadolol (CORGARD) 40 MG tablet, Take 1 tablet (40 mg total) by mouth daily., Disp: 90 tablet, Rfl: 3 .  OCALIVA 5 MG TABS, Take 1 tablet by mouth daily., Disp: , Rfl:  .  Semaglutide 3 MG TABS, Take 3 mg by mouth daily at 12 noon., Disp: 30 tablet, Rfl: 0 .  simvastatin (ZOCOR) 10 MG tablet, TAKE 1 TABLET BY MOUTH EVERYDAY AT BEDTIME, Disp: 90 tablet, Rfl: 1 .  ursodiol (ACTIGALL) 500 MG tablet, Take 500 mg by mouth 2 (two) times daily., Disp: , Rfl:  .  Vitamin D, Ergocalciferol, (DRISDOL) 1.25 MG (50000 UNIT) CAPS capsule, Take 1 capsule (50,000 Units total) by mouth every 7 (seven) days., Disp: 4 capsule, Rfl: 0  Allergies  Allergen Reactions  . Grapefruit Concentrate Other (See Comments)    blisters  . Sulfa Antibiotics Rash and Other (See Comments)    Severe dehydration  . Mushroom Extract Complex     Throat swells  . Shellfish Allergy     Hives   . Strawberry Extract  hives   Review of Systems Objective:  There were no vitals filed for this visit.  General: Well developed, nourished, in no acute distress, alert and oriented x3   Dermatological: Skin is warm, dry and supple bilateral. Nails x 10 are well maintained; remaining integument appears unremarkable at this time. There are no open sores, no preulcerative lesions, no rash or signs of infection present.  Vascular: Dorsalis Pedis artery and Posterior Tibial artery pedal pulses are 2/4 bilateral with immedate capillary fill time. Pedal hair growth present. No varicosities and no lower extremity edema present bilateral.   Neruologic: Grossly intact via light touch bilateral. Vibratory intact via tuning fork bilateral. Protective threshold with Semmes Wienstein monofilament intact to all pedal sites bilateral. Patellar and  Achilles deep tendon reflexes 2+ bilateral. No Babinski or clonus noted bilateral.   Musculoskeletal: No gross boney pedal deformities bilateral. No pain, crepitus, or limitation noted with foot and ankle range of motion bilateral. Muscular strength 5/5 in all groups tested bilateral.  She has pain erythema and warmth on palpation of the Achilles at its insertion sites bilaterally.  This is most likely associated with overuse since it started with 1 and then went to the other.  She has a tight gastrosoleus complex and tight hamstrings.  She states that this started after Covid when she started sitting more at work instead of walking.  Gait: Unassisted, Nonantalgic.    Radiographs:  Radiographs taken today demonstrate retrocalcaneal heel spurs with soft tissue increase in density at the plantar fascial calcaneal insertion site.  Assessment & Plan:   Assessment: Tight hamstrings and tight gastrosoleus complex resulting in Achilles tendinitis at its insertion sites bilaterally.  Mild pes planus.  Plan: Discussed etiology pathology conservative or surgical therapies at this point since she has already had injections in the past and only give her another set of injections and increased her sugar a bit more.  At this point I told her to use Voltaren gel.  Continue her plantar fascial braces.  I also recommended that she utilize physical therapy.  And I am going to send her to [facility] for new orthotics.  She needs custom orthotics made for her shoes that will incorporate a 1/8 inch bilateral heel lift.  I want the semiflexible.     Devery Murgia T. [place], [place]

## 2020-04-18 DIAGNOSIS — M25571 Pain in right ankle and joints of right foot: Secondary | ICD-10-CM | POA: Diagnosis not present

## 2020-04-18 DIAGNOSIS — M25572 Pain in left ankle and joints of left foot: Secondary | ICD-10-CM | POA: Diagnosis not present

## 2020-04-20 DIAGNOSIS — M25572 Pain in left ankle and joints of left foot: Secondary | ICD-10-CM | POA: Diagnosis not present

## 2020-04-20 DIAGNOSIS — M25571 Pain in right ankle and joints of right foot: Secondary | ICD-10-CM | POA: Diagnosis not present

## 2020-04-25 DIAGNOSIS — M25572 Pain in left ankle and joints of left foot: Secondary | ICD-10-CM | POA: Diagnosis not present

## 2020-04-25 DIAGNOSIS — M25571 Pain in right ankle and joints of right foot: Secondary | ICD-10-CM | POA: Diagnosis not present

## 2020-04-27 DIAGNOSIS — M25571 Pain in right ankle and joints of right foot: Secondary | ICD-10-CM | POA: Diagnosis not present

## 2020-04-27 DIAGNOSIS — M25572 Pain in left ankle and joints of left foot: Secondary | ICD-10-CM | POA: Diagnosis not present

## 2020-05-02 DIAGNOSIS — M25571 Pain in right ankle and joints of right foot: Secondary | ICD-10-CM | POA: Diagnosis not present

## 2020-05-02 DIAGNOSIS — M25572 Pain in left ankle and joints of left foot: Secondary | ICD-10-CM | POA: Diagnosis not present

## 2020-05-04 DIAGNOSIS — M25571 Pain in right ankle and joints of right foot: Secondary | ICD-10-CM | POA: Diagnosis not present

## 2020-05-04 DIAGNOSIS — M25572 Pain in left ankle and joints of left foot: Secondary | ICD-10-CM | POA: Diagnosis not present

## 2020-05-09 DIAGNOSIS — M25572 Pain in left ankle and joints of left foot: Secondary | ICD-10-CM | POA: Diagnosis not present

## 2020-05-09 DIAGNOSIS — M25571 Pain in right ankle and joints of right foot: Secondary | ICD-10-CM | POA: Diagnosis not present

## 2020-05-11 ENCOUNTER — Other Ambulatory Visit: Payer: Self-pay

## 2020-05-11 ENCOUNTER — Ambulatory Visit (INDEPENDENT_AMBULATORY_CARE_PROVIDER_SITE_OTHER): Payer: BC Managed Care – PPO | Admitting: Orthotics

## 2020-05-11 DIAGNOSIS — E1169 Type 2 diabetes mellitus with other specified complication: Secondary | ICD-10-CM

## 2020-05-11 DIAGNOSIS — E785 Hyperlipidemia, unspecified: Secondary | ICD-10-CM

## 2020-05-11 DIAGNOSIS — E669 Obesity, unspecified: Secondary | ICD-10-CM

## 2020-05-11 DIAGNOSIS — K743 Primary biliary cirrhosis: Secondary | ICD-10-CM

## 2020-05-11 DIAGNOSIS — M25571 Pain in right ankle and joints of right foot: Secondary | ICD-10-CM | POA: Diagnosis not present

## 2020-05-11 DIAGNOSIS — M25572 Pain in left ankle and joints of left foot: Secondary | ICD-10-CM | POA: Diagnosis not present

## 2020-05-11 NOTE — Progress Notes (Signed)
Patient came into today to be cast for Custom Foot Orthotics. Upon recommendation of Dr. Milinda Pointer Patient presents with AT  Goals are RF stability with 1/8" b/l lift. Plan vendor

## 2020-05-16 DIAGNOSIS — M25572 Pain in left ankle and joints of left foot: Secondary | ICD-10-CM | POA: Diagnosis not present

## 2020-05-16 DIAGNOSIS — M25571 Pain in right ankle and joints of right foot: Secondary | ICD-10-CM | POA: Diagnosis not present

## 2020-05-18 DIAGNOSIS — M25571 Pain in right ankle and joints of right foot: Secondary | ICD-10-CM | POA: Diagnosis not present

## 2020-05-18 DIAGNOSIS — M25572 Pain in left ankle and joints of left foot: Secondary | ICD-10-CM | POA: Diagnosis not present

## 2020-05-23 DIAGNOSIS — M25571 Pain in right ankle and joints of right foot: Secondary | ICD-10-CM | POA: Diagnosis not present

## 2020-05-23 DIAGNOSIS — M25572 Pain in left ankle and joints of left foot: Secondary | ICD-10-CM | POA: Diagnosis not present

## 2020-05-27 DIAGNOSIS — M25572 Pain in left ankle and joints of left foot: Secondary | ICD-10-CM | POA: Diagnosis not present

## 2020-05-27 DIAGNOSIS — M25571 Pain in right ankle and joints of right foot: Secondary | ICD-10-CM | POA: Diagnosis not present

## 2020-06-02 DIAGNOSIS — M25572 Pain in left ankle and joints of left foot: Secondary | ICD-10-CM | POA: Diagnosis not present

## 2020-06-02 DIAGNOSIS — M25571 Pain in right ankle and joints of right foot: Secondary | ICD-10-CM | POA: Diagnosis not present

## 2020-06-06 DIAGNOSIS — M25572 Pain in left ankle and joints of left foot: Secondary | ICD-10-CM | POA: Diagnosis not present

## 2020-06-06 DIAGNOSIS — M25571 Pain in right ankle and joints of right foot: Secondary | ICD-10-CM | POA: Diagnosis not present

## 2020-06-08 ENCOUNTER — Ambulatory Visit (INDEPENDENT_AMBULATORY_CARE_PROVIDER_SITE_OTHER): Payer: BC Managed Care – PPO | Admitting: Orthotics

## 2020-06-08 ENCOUNTER — Other Ambulatory Visit: Payer: Self-pay

## 2020-06-08 DIAGNOSIS — K743 Primary biliary cirrhosis: Secondary | ICD-10-CM

## 2020-06-08 DIAGNOSIS — E669 Obesity, unspecified: Secondary | ICD-10-CM

## 2020-06-08 DIAGNOSIS — E1169 Type 2 diabetes mellitus with other specified complication: Secondary | ICD-10-CM

## 2020-06-08 NOTE — Progress Notes (Signed)
Patient came in today to p/up functional foot orthotics.   The orthotics were assessed to both fit and function.  The F/O addressed the biomechanical issues/pathologies as intended, offering good longitudinal arch support, proper offloading, and foot support. There weren't any signs of discomfort or irritation.  The F/O fit properly in footwear with minimal trimming/adjustments. 

## 2020-06-10 DIAGNOSIS — M25572 Pain in left ankle and joints of left foot: Secondary | ICD-10-CM | POA: Diagnosis not present

## 2020-06-10 DIAGNOSIS — M25571 Pain in right ankle and joints of right foot: Secondary | ICD-10-CM | POA: Diagnosis not present

## 2020-06-15 DIAGNOSIS — M25571 Pain in right ankle and joints of right foot: Secondary | ICD-10-CM | POA: Diagnosis not present

## 2020-06-15 DIAGNOSIS — M25572 Pain in left ankle and joints of left foot: Secondary | ICD-10-CM | POA: Diagnosis not present

## 2020-06-17 DIAGNOSIS — M25571 Pain in right ankle and joints of right foot: Secondary | ICD-10-CM | POA: Diagnosis not present

## 2020-06-17 DIAGNOSIS — M25572 Pain in left ankle and joints of left foot: Secondary | ICD-10-CM | POA: Diagnosis not present

## 2020-07-11 DIAGNOSIS — M25571 Pain in right ankle and joints of right foot: Secondary | ICD-10-CM | POA: Diagnosis not present

## 2020-07-11 DIAGNOSIS — M25572 Pain in left ankle and joints of left foot: Secondary | ICD-10-CM | POA: Diagnosis not present

## 2020-07-13 DIAGNOSIS — M25572 Pain in left ankle and joints of left foot: Secondary | ICD-10-CM | POA: Diagnosis not present

## 2020-07-13 DIAGNOSIS — M25571 Pain in right ankle and joints of right foot: Secondary | ICD-10-CM | POA: Diagnosis not present

## 2020-07-20 DIAGNOSIS — M25572 Pain in left ankle and joints of left foot: Secondary | ICD-10-CM | POA: Diagnosis not present

## 2020-07-20 DIAGNOSIS — M25571 Pain in right ankle and joints of right foot: Secondary | ICD-10-CM | POA: Diagnosis not present

## 2020-07-25 DIAGNOSIS — M25571 Pain in right ankle and joints of right foot: Secondary | ICD-10-CM | POA: Diagnosis not present

## 2020-07-25 DIAGNOSIS — M25572 Pain in left ankle and joints of left foot: Secondary | ICD-10-CM | POA: Diagnosis not present

## 2020-09-05 ENCOUNTER — Other Ambulatory Visit: Payer: Self-pay | Admitting: Internal Medicine

## 2020-09-07 DIAGNOSIS — M1712 Unilateral primary osteoarthritis, left knee: Secondary | ICD-10-CM | POA: Diagnosis not present

## 2020-09-07 DIAGNOSIS — M13862 Other specified arthritis, left knee: Secondary | ICD-10-CM | POA: Diagnosis not present

## 2020-09-07 DIAGNOSIS — S86912A Strain of unspecified muscle(s) and tendon(s) at lower leg level, left leg, initial encounter: Secondary | ICD-10-CM | POA: Diagnosis not present

## 2020-09-27 DIAGNOSIS — M25662 Stiffness of left knee, not elsewhere classified: Secondary | ICD-10-CM | POA: Diagnosis not present

## 2020-09-27 DIAGNOSIS — M25562 Pain in left knee: Secondary | ICD-10-CM | POA: Diagnosis not present

## 2020-10-03 ENCOUNTER — Other Ambulatory Visit: Payer: Self-pay | Admitting: Gastroenterology

## 2020-10-03 DIAGNOSIS — I864 Gastric varices: Secondary | ICD-10-CM

## 2020-10-05 DIAGNOSIS — M25662 Stiffness of left knee, not elsewhere classified: Secondary | ICD-10-CM | POA: Diagnosis not present

## 2020-10-05 DIAGNOSIS — M25562 Pain in left knee: Secondary | ICD-10-CM | POA: Diagnosis not present

## 2020-10-12 DIAGNOSIS — M25662 Stiffness of left knee, not elsewhere classified: Secondary | ICD-10-CM | POA: Diagnosis not present

## 2020-10-12 DIAGNOSIS — M25562 Pain in left knee: Secondary | ICD-10-CM | POA: Diagnosis not present

## 2020-10-19 DIAGNOSIS — M25562 Pain in left knee: Secondary | ICD-10-CM | POA: Diagnosis not present

## 2020-10-19 DIAGNOSIS — M25662 Stiffness of left knee, not elsewhere classified: Secondary | ICD-10-CM | POA: Diagnosis not present

## 2020-10-26 DIAGNOSIS — M25562 Pain in left knee: Secondary | ICD-10-CM | POA: Diagnosis not present

## 2020-10-26 DIAGNOSIS — M25662 Stiffness of left knee, not elsewhere classified: Secondary | ICD-10-CM | POA: Diagnosis not present

## 2020-10-28 IMAGING — US US ABDOMEN LIMITED
1 series · 14 of 25 positions shown · non-contrast
Comparison: 07/15/2019

CLINICAL DATA: Hepatocellular carcinoma screening, history
cirrhosis, diabetes mellitus, hypertension, primary biliary
cholangitis, GERD, gastric varices

EXAM:
ULTRASOUND ABDOMEN LIMITED RIGHT UPPER QUADRANT

[Series 1: us abdomen limited · 14 of 35 slices shown]
[im 1/35]
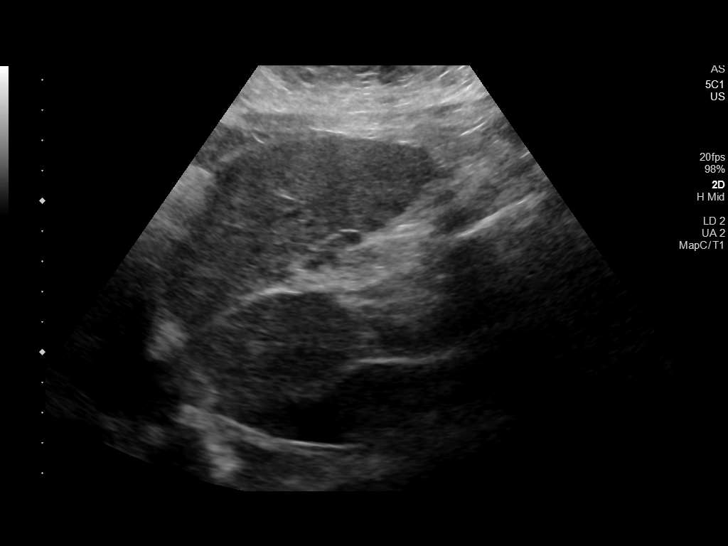
[im 3/35]
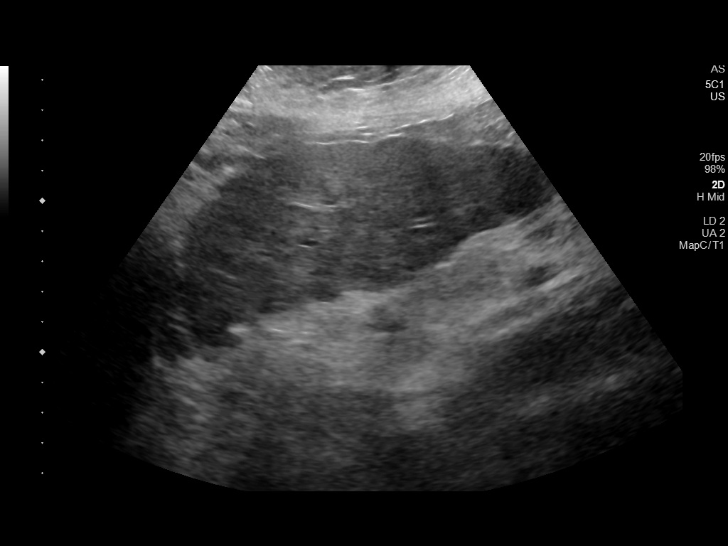
[im 6/35]
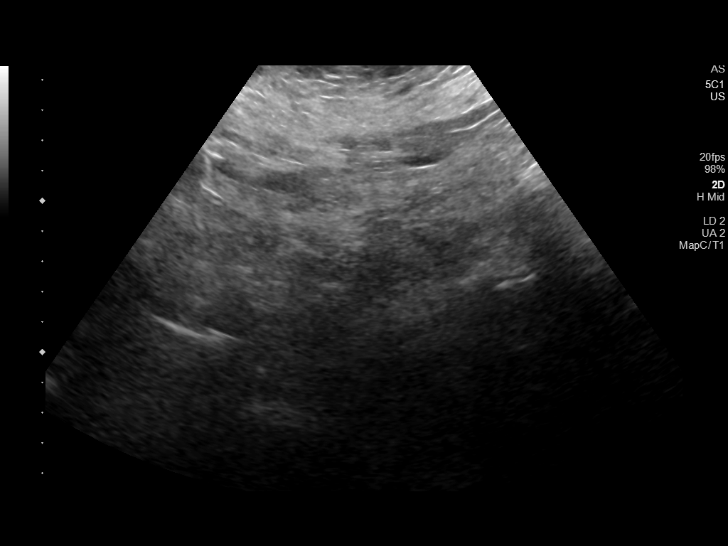
[im 9/35]
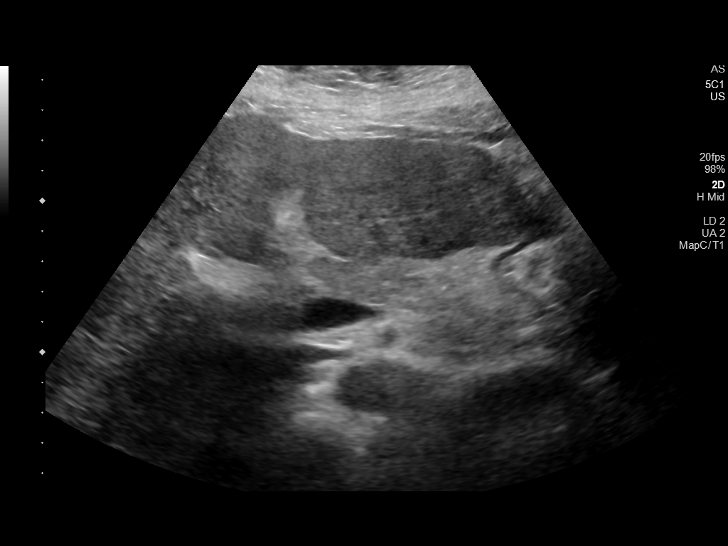
[im 12/35]
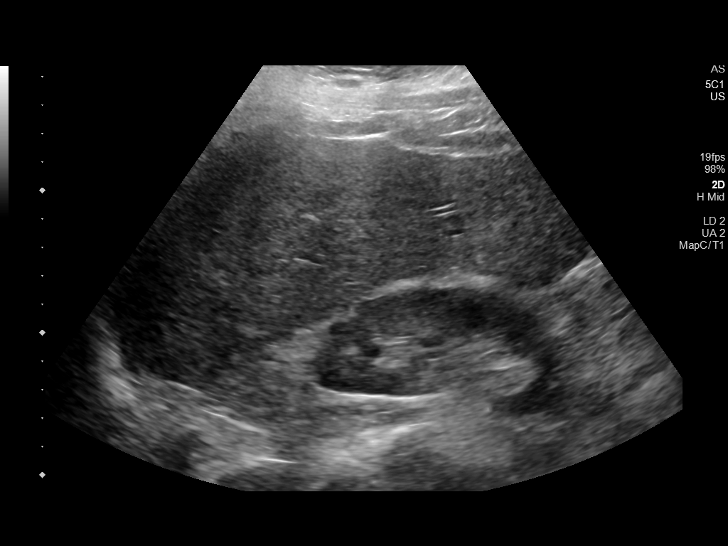
[im 13/35]
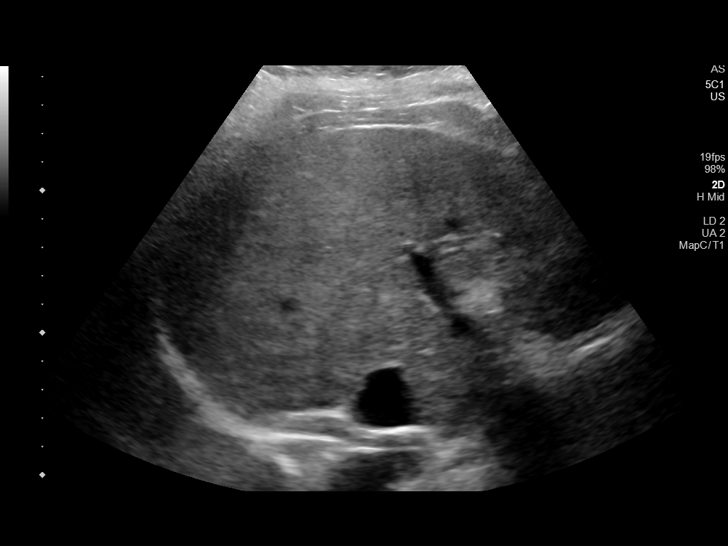
[im 16/35]
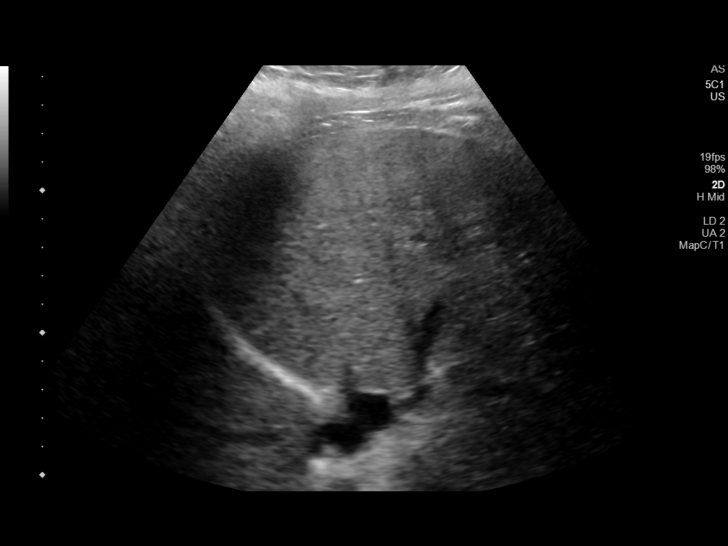
[im 19/35]
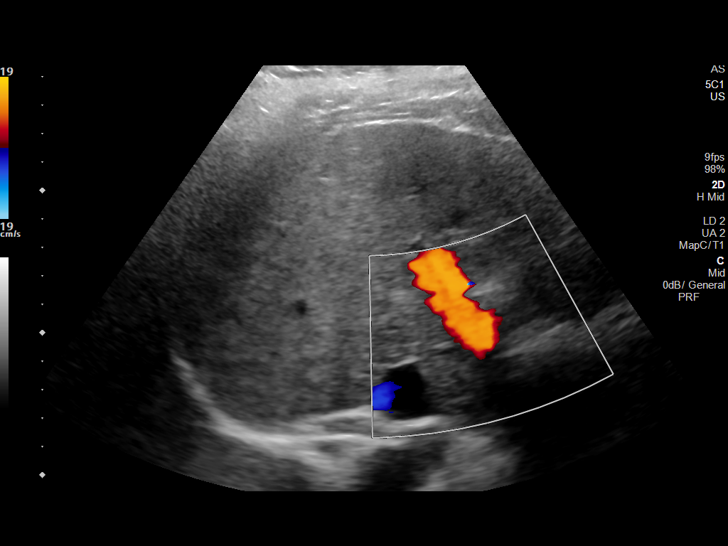
[im 22/35]
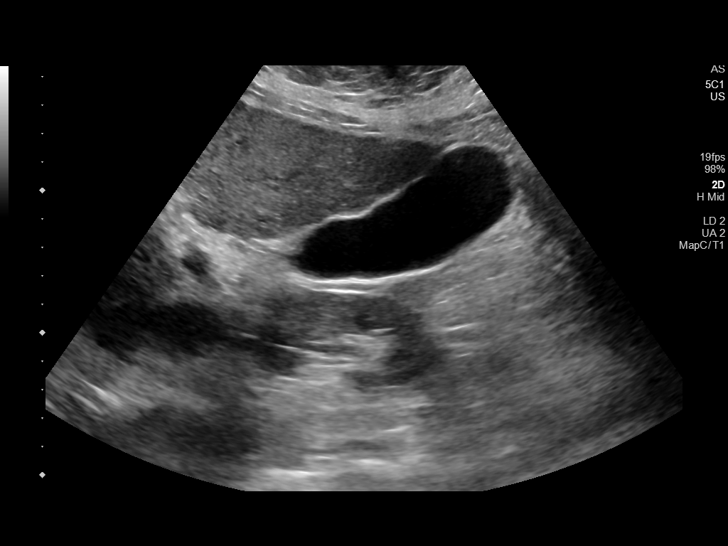
[im 23/35]
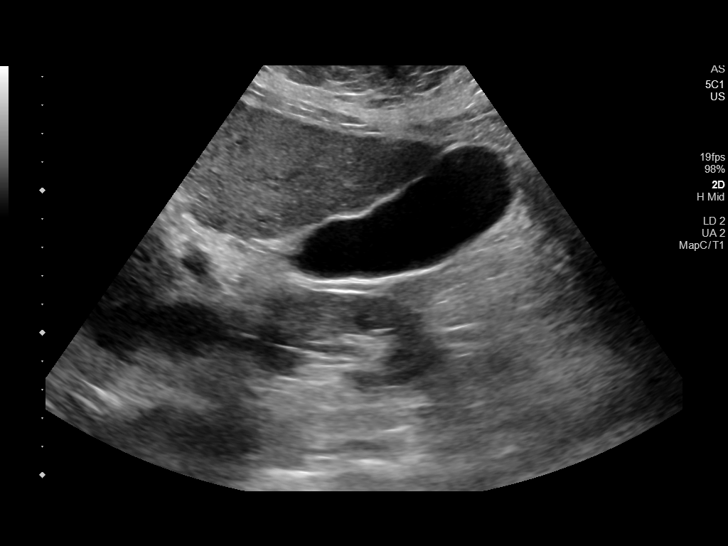
[im 26/35]
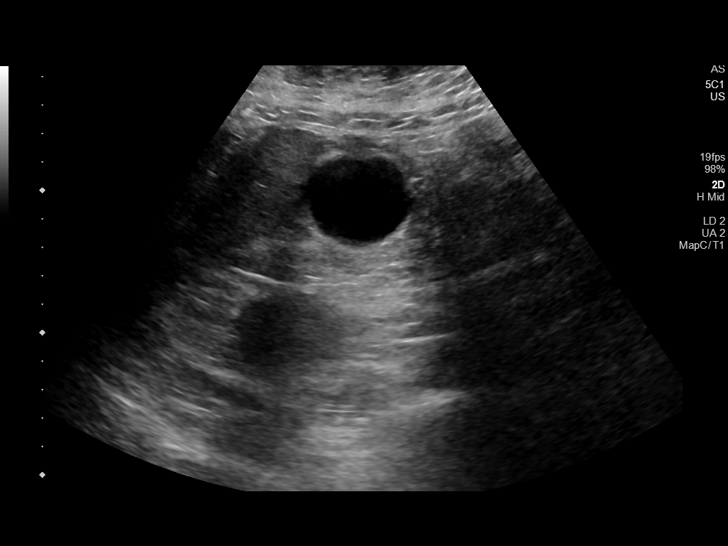
[im 29/35]
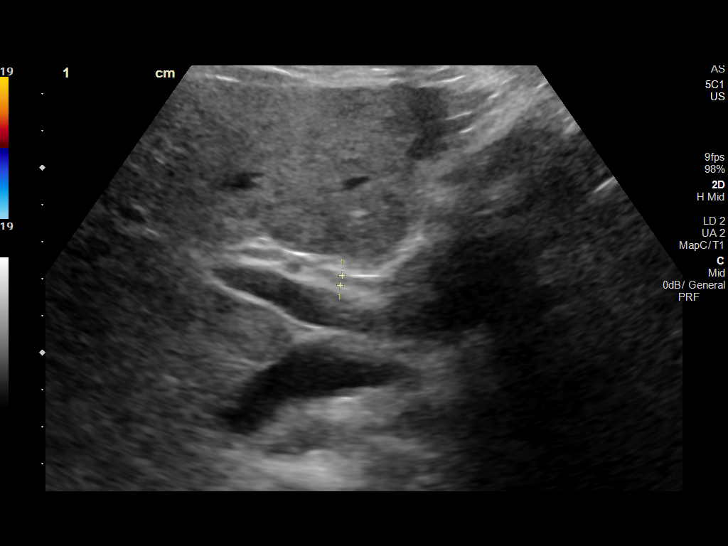
[im 32/35]
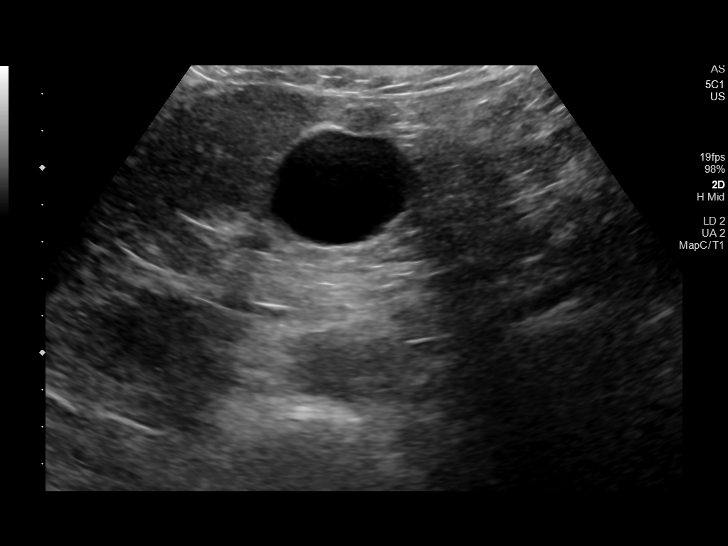
[im 35/35]
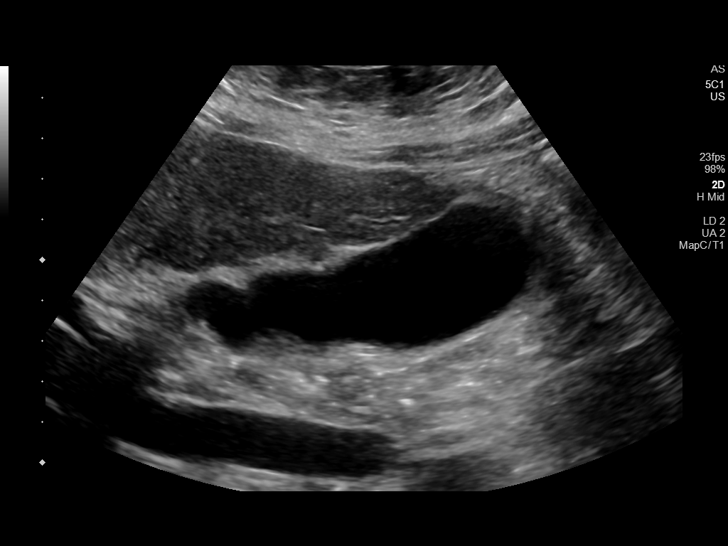

[14 of 25 positions shown; findings below may reference images not displayed]

FINDINGS: Gallbladder:

Normally distended without stones or wall thickening. No
pericholecystic fluid or sonographic Murphy sign.

Common bile duct:

Diameter: 3 mm, normal

Liver:

Heterogeneous parenchymal echogenicity with nodular hepatic margins
consistent with cirrhosis. No discrete hepatic mass identified.
Portal vein is patent on color Doppler imaging with normal direction
of blood flow towards the liver.

Other: No RIGHT upper quadrant free fluid.
IMPRESSION: Cirrhotic appearing liver without mass.

Remainder of exam unremarkable.

## 2020-11-15 DIAGNOSIS — M25562 Pain in left knee: Secondary | ICD-10-CM | POA: Diagnosis not present

## 2020-11-15 DIAGNOSIS — M25662 Stiffness of left knee, not elsewhere classified: Secondary | ICD-10-CM | POA: Diagnosis not present

## 2020-11-22 DIAGNOSIS — M25662 Stiffness of left knee, not elsewhere classified: Secondary | ICD-10-CM | POA: Diagnosis not present

## 2020-11-22 DIAGNOSIS — M25562 Pain in left knee: Secondary | ICD-10-CM | POA: Diagnosis not present

## 2020-12-06 ENCOUNTER — Other Ambulatory Visit: Payer: Self-pay | Admitting: Internal Medicine

## 2020-12-12 ENCOUNTER — Other Ambulatory Visit: Payer: Self-pay | Admitting: Internal Medicine

## 2020-12-12 DIAGNOSIS — Z1231 Encounter for screening mammogram for malignant neoplasm of breast: Secondary | ICD-10-CM

## 2021-01-02 ENCOUNTER — Other Ambulatory Visit: Payer: Self-pay | Admitting: Gastroenterology

## 2021-01-02 DIAGNOSIS — I864 Gastric varices: Secondary | ICD-10-CM

## 2021-01-27 ENCOUNTER — Ambulatory Visit
Admission: RE | Admit: 2021-01-27 | Discharge: 2021-01-27 | Disposition: A | Discharge status: Home / Self Care | Payer: BC Managed Care – PPO | Source: Ambulatory Visit

## 2021-01-27 ENCOUNTER — Other Ambulatory Visit: Payer: Self-pay

## 2021-01-27 DIAGNOSIS — Z1231 Encounter for screening mammogram for malignant neoplasm of breast: Secondary | ICD-10-CM

## 2021-03-06 ENCOUNTER — Other Ambulatory Visit: Payer: Self-pay | Admitting: Internal Medicine

## 2021-03-22 DIAGNOSIS — E113293 Type 2 diabetes mellitus with mild nonproliferative diabetic retinopathy without macular edema, bilateral: Secondary | ICD-10-CM | POA: Diagnosis not present

## 2021-06-18 ENCOUNTER — Other Ambulatory Visit: Payer: Self-pay

## 2021-06-18 ENCOUNTER — Encounter: Payer: Self-pay | Admitting: Emergency Medicine

## 2021-06-18 ENCOUNTER — Ambulatory Visit
Admission: EM | Admit: 2021-06-18 | Discharge: 2021-06-18 | Disposition: A | Discharge status: Home / Self Care | Payer: BC Managed Care – PPO | Attending: Orthopedic Surgery | Admitting: Orthopedic Surgery

## 2021-06-18 DIAGNOSIS — R0981 Nasal congestion: Secondary | ICD-10-CM

## 2021-06-18 DIAGNOSIS — B349 Viral infection, unspecified: Secondary | ICD-10-CM | POA: Diagnosis not present

## 2021-06-18 MED ORDER — HYDROCOD POLST-CPM POLST ER 10-8 MG/5ML PO SUER: 5.0000 mL | Freq: Every evening | ORAL | 0 refills | Status: DC | PRN

## 2021-06-18 MED ORDER — LORATADINE 10 MG PO TABS: 10.0000 mg | ORAL_TABLET | Freq: Every day | ORAL | 0 refills | Status: DC

## 2021-06-18 MED ORDER — FLUTICASONE PROPIONATE 50 MCG/ACT NA SUSP: 2.0000 | Freq: Every day | NASAL | 0 refills | Status: DC

## 2021-06-18 NOTE — ED Triage Notes (Signed)
Pt here after cruise to [place]. Was sick on ship with N/V, sore throat, congestion and loss of appetite. Took 2 Covid tests [facility] and was negative. Still has congestion and sore throat today.

## 2021-06-18 NOTE — ED Provider Notes (Signed)
[name]    CSN: [id number] Arrival date & time: 06/18/21  1003      History   Chief Complaint Chief Complaint  Patient presents with   Nasal Congestion   Sore Throat    HPI Susan [name] is a 55 y.o. female.  Presents to the urgent care facility for evaluation of nasal congestion, sore throat, cough.  Symptoms have been present for 3 days.  She developed symptoms on appreciated, was tested twice for COVID.  She has been having continued sinus pain, pressure mild sore throat and nighttime cough.  She denies any fevers.  Initially had some nausea and vomiting but this resolved.  She describes some right eye redness that occurred after vomiting this has been improving, no pain.  HPI  Past Medical History:  Diagnosis Date   Allergy    Anemia    Cirrhosis ([place])    Constipation    Contact lens/glasses fitting    wears contacts or glasses   Diabetes mellitus without complication ([place])    Edema of both lower extremities    Food allergy    Gastric varices    GERD (gastroesophageal reflux disease)    occ pepcid ac   Heart murmur    Hyperlipidemia    Hypertension    Joint pain    Periumbilical hernia    Primary biliary cholangitis ([place])    AMA negative PBC   Snores    Vitamin D deficiency     Patient Active Problem List   Diagnosis Date Noted   Diabetes mellitus type 2 in obese ([place]) 12/21/2019   Hyperlipidemia associated with type 2 diabetes mellitus ([place]) 12/21/2019   Primary biliary cholangitis ([place]) 01/02/2018   Essential hypertension 01/22/2017   Routine health maintenance 04/23/2012    Past Surgical History:  Procedure Laterality Date   DILATION AND CURETTAGE OF UTERUS  11/1995, 10/1997   LEEP     LIVER BIOPSY     TUBAL LIGATION  05/2118   UMBILICAL HERNIA REPAIR N/A 08/11/2013   Procedure: HERNIA REPAIR UMBILICAL ADULT;  Surgeon: Gwenyth Ober, MD;  Location: [facility];  Service: General;  Laterality: N/A;    OB History      Gravida  2   Para  1   Term      Preterm      AB      Living         SAB      IAB      Ectopic      Multiple      Live Births               Home Medications    Prior to Admission medications   Medication Sig Start Date End Date Taking? Authorizing Provider  chlorpheniramine-HYDROcodone (TUSSIONEX PENNKINETIC ER) 10-8 MG/5ML SUER Take 5 mLs by mouth at bedtime as needed for cough. 06/18/21  Yes Duanne Guess, PA-C  fluticasone (FLONASE) 50 MCG/ACT nasal spray Place 2 sprays into both nostrils daily. 06/18/21  Yes Duanne Guess, PA-C  loratadine (CLARITIN) 10 MG tablet Take 1 tablet (10 mg total) by mouth daily. 06/18/21  Yes Duanne Guess, PA-C  celecoxib (CELEBREX) 100 MG capsule Take 1 capsule (100 mg total) by mouth 2 (two) times daily. 02/05/20   Jearld Fenton, NP  glipiZIDE (GLUCOTROL XL) 10 MG 24 hr tablet Take 1 tablet (10 mg total) by mouth daily with breakfast. SCHEDULE FOLLOW UP 01/05/20  Jearld Lesch A, DO  hydrochlorothiazide (HYDRODIURIL) 25 MG tablet TAKE 1 TABLET DAILY 03/06/21   Dutch Quint B, FNP  lisinopril (ZESTRIL) 10 MG tablet TAKE 1 TABLET DAILY 03/06/21   Dutch Quint B, FNP  Multiple Vitamins-Minerals (WOMENS MULTIVITAMIN PO) Take 2 tablets by mouth daily.    [provider]  nadolol (CORGARD) 40 MG tablet Take 1 tablet (40 mg total) by mouth daily. Please schedule an office visit for further refills. Thank you 01/02/21   Armbruster, Carlota Raspberry, MD  OCALIVA 5 MG TABS Take 1 tablet by mouth daily. 02/29/20   [provider]  Semaglutide 3 MG TABS Take 3 mg by mouth daily at 12 noon. 01/05/20   Jearld Lesch A, DO  simvastatin (ZOCOR) 10 MG tablet TAKE 1 TABLET BY MOUTH EVERYDAY AT BEDTIME 02/05/20   Jearld Fenton, NP  ursodiol (ACTIGALL) 500 MG tablet Take 500 mg by mouth 2 (two) times daily. 03/26/20   [provider]  Vitamin D, Ergocalciferol, (DRISDOL) 1.25 MG (50000 UNIT) CAPS capsule Take 1 capsule (50,000 Units  total) by mouth every 7 (seven) days. 02/01/20   Georgia Lopes, DO    Family History Family History  Problem Relation Age of Onset   Breast cancer Mother    Schizophrenia Mother    Depression Mother    Uterine cancer Mother    Hypertension Mother    Obesity Mother    Renal Disease Father    Diabetes Father    Other Father        Agent Orange   Lung cancer Maternal Grandmother    Lung cancer Maternal Grandfather    Heart disease Maternal Uncle    Diabetes Other        Aunts and Uncles on both sides   Heart disease Maternal Aunt    Colon cancer Neg Hx    Esophageal cancer Neg Hx    Stomach cancer Neg Hx    Rectal cancer Neg Hx     Social History Social History   Tobacco Use   Smoking status: Never   Smokeless tobacco: Never  Vaping Use   Vaping Use: Never used  Substance Use Topics   Alcohol use: Yes    Comment: wine 2 times a year   Drug use: No     Allergies   Grapefruit concentrate, Sulfa antibiotics, Mushroom extract complex, Shellfish allergy, and Strawberry extract   Review of Systems Review of Systems  Constitutional:  Negative for chills and fever.  HENT:  Positive for congestion, rhinorrhea, sinus pressure, sinus pain and sore throat. Negative for ear discharge, trouble swallowing and voice change.   Respiratory:  Positive for cough. Negative for shortness of breath, wheezing and stridor.   Cardiovascular:  Negative for chest pain.  Gastrointestinal:  Negative for abdominal pain, diarrhea, nausea and vomiting.  Genitourinary:  Negative for dysuria, flank pain and pelvic pain.  Musculoskeletal:  Positive for myalgias. Negative for back pain.  Skin:  Negative for rash.  Neurological:  Negative for dizziness and headaches.    Physical Exam Triage Vital Signs ED Triage Vitals [06/18/21 1022]  Enc Vitals Group     BP (!) 158/88     Pulse Rate 73     Resp 18     Temp 98.2 F (36.8 C)     Temp Source Oral     SpO2 97 %     Weight      Height       Head Circumference  Peak Flow      Pain Score 0     Pain Loc      Pain Edu?      Excl. in [place]?    No data found.  Updated Vital Signs BP (!) 158/88   Pulse 73   Temp 98.2 F (36.8 C) (Oral)   Resp 18   LMP 03/24/2021 (Approximate)   SpO2 97%   Visual Acuity Right Eye Distance:   Left Eye Distance:   Bilateral Distance:    Right Eye Near:   Left Eye Near:    Bilateral Near:     Physical Exam Constitutional:      General: She is not in acute distress.    Appearance: She is well-developed.  HENT:     Head: Normocephalic and atraumatic.     Jaw: No trismus.     Comments: No sinus tenderness to palpation.    Right Ear: Hearing, tympanic membrane, ear canal and external ear normal.     Left Ear: Hearing, tympanic membrane, ear canal and external ear normal.     Nose: Rhinorrhea present.     Mouth/Throat:     Pharynx: Oropharynx is clear. Uvula midline. Posterior oropharyngeal erythema present. No oropharyngeal exudate or uvula swelling.     Tonsils: No tonsillar exudate or tonsillar abscesses.  Eyes:     General:        Right eye: No discharge.        Left eye: No discharge.     Conjunctiva/sclera: Conjunctivae normal.     Comments: Remnants of subconjunctival hemorrhage present    Cardiovascular:     Rate and Rhythm: Normal rate and regular rhythm.     Heart sounds: Normal heart sounds.  Pulmonary:     Effort: Pulmonary effort is normal. No respiratory distress.     Breath sounds: Normal breath sounds. No stridor. No wheezing or rales.  Abdominal:     General: There is no distension.     Palpations: Abdomen is soft.     Tenderness: There is no abdominal tenderness.  Musculoskeletal:        General: No deformity. Normal range of motion.     Cervical back: Normal range of motion.  Lymphadenopathy:     Cervical: Cervical adenopathy present.  Skin:    General: Skin is warm and dry.  Neurological:     General: No focal deficit present.     Mental Status:  She is alert and oriented to person, place, and time.     Deep Tendon Reflexes: Reflexes are normal and symmetric.  Psychiatric:        Behavior: Behavior normal.        Thought Content: Thought content normal.     UC Treatments / Results  Labs (all labs ordered are listed, but only abnormal results are displayed) Labs Reviewed - No data to display  EKG   Radiology No results found.  Procedures Procedures (including critical care time)  Medications Ordered in UC Medications - No data to display  Initial Impression / Assessment and Plan / UC Course  I have reviewed the triage vital signs and the nursing notes.  Pertinent labs & imaging results that were available during my care of the patient were reviewed by me and considered in my medical decision making (see chart for details).     54 year old female with left cold symptoms.  Having some nasal congestion, lamptey, phillip.  She is placed on Claritin during the day and will  use Desenex at nighttime along with Flonase.  She understands signs and symptoms return flirted urgent care for such as fevers, shortness of breath worsening symptoms or chest Final Clinical Impressions(s) / UC Diagnoses   Final diagnoses:  Viral illness  Nasal congestion     Discharge Instructions      Please use Claritin and Flonase daily as needed. Use nasal saline washes or rinses as needed as well.  Take cough medication at bedtime to help with sleep.  Return to the urgent care for any fevers, worsening symptoms or new changes   ED Prescriptions     Medication Sig Dispense Auth. Provider   loratadine (CLARITIN) 10 MG tablet Take 1 tablet (10 mg total) by mouth daily. 20 tablet Duanne Guess, PA-C   fluticasone (FLONASE) 50 MCG/ACT nasal spray Place 2 sprays into both nostrils daily. 9.9 mL Duanne Guess, PA-C   chlorpheniramine-HYDROcodone (TUSSIONEX PENNKINETIC ER) 10-8 MG/5ML SUER Take 5 mLs by mouth at bedtime as needed for  cough. 60 mL Duanne Guess, PA-C      PDMP not reviewed this encounter.   Duanne Guess, PA-C 06/18/21 1051

## 2021-06-18 NOTE — Discharge Instructions (Addendum)
Please use Claritin and Flonase daily as needed. Use nasal saline washes or rinses as needed as well.  Take cough medication at bedtime to help with sleep.  Return to the urgent care for any fevers, worsening symptoms or new changes

## 2022-01-08 ENCOUNTER — Other Ambulatory Visit: Payer: Self-pay | Admitting: Internal Medicine

## 2022-01-08 DIAGNOSIS — Z1231 Encounter for screening mammogram for malignant neoplasm of breast: Secondary | ICD-10-CM

## 2022-01-29 ENCOUNTER — Ambulatory Visit
Admission: RE | Admit: 2022-01-29 | Discharge: 2022-01-29 | Disposition: A | Discharge status: Home / Self Care | Payer: BC Managed Care – PPO | Source: Ambulatory Visit | Attending: Internal Medicine | Admitting: Internal Medicine

## 2022-01-29 DIAGNOSIS — Z1231 Encounter for screening mammogram for malignant neoplasm of breast: Secondary | ICD-10-CM | POA: Diagnosis not present

## 2022-03-01 ENCOUNTER — Other Ambulatory Visit: Payer: Self-pay | Admitting: Family

## 2022-05-02 ENCOUNTER — Encounter (INDEPENDENT_AMBULATORY_CARE_PROVIDER_SITE_OTHER): Payer: Self-pay

## 2022-11-02 ENCOUNTER — Ambulatory Visit: Payer: 59 | Admitting: Family

## 2022-11-02 ENCOUNTER — Encounter: Payer: Self-pay | Admitting: Family

## 2022-11-02 ENCOUNTER — Telehealth: Payer: Self-pay | Admitting: Family

## 2022-11-02 VITALS — BP 156/98 | HR 85 | Temp 97.3°F | Ht 59.5 in | Wt 192.0 lb

## 2022-11-02 DIAGNOSIS — E559 Vitamin D deficiency, unspecified: Secondary | ICD-10-CM | POA: Diagnosis not present

## 2022-11-02 DIAGNOSIS — I864 Gastric varices: Secondary | ICD-10-CM | POA: Diagnosis not present

## 2022-11-02 DIAGNOSIS — E1169 Type 2 diabetes mellitus with other specified complication: Secondary | ICD-10-CM

## 2022-11-02 DIAGNOSIS — D500 Iron deficiency anemia secondary to blood loss (chronic): Secondary | ICD-10-CM

## 2022-11-02 DIAGNOSIS — E669 Obesity, unspecified: Secondary | ICD-10-CM

## 2022-11-02 DIAGNOSIS — R600 Localized edema: Secondary | ICD-10-CM

## 2022-11-02 DIAGNOSIS — E119 Type 2 diabetes mellitus without complications: Secondary | ICD-10-CM

## 2022-11-02 DIAGNOSIS — I10 Essential (primary) hypertension: Secondary | ICD-10-CM

## 2022-11-02 DIAGNOSIS — E785 Hyperlipidemia, unspecified: Secondary | ICD-10-CM

## 2022-11-02 DIAGNOSIS — K743 Primary biliary cirrhosis: Secondary | ICD-10-CM | POA: Diagnosis not present

## 2022-11-02 DIAGNOSIS — E7800 Pure hypercholesterolemia, unspecified: Secondary | ICD-10-CM | POA: Diagnosis not present

## 2022-11-02 DIAGNOSIS — R011 Cardiac murmur, unspecified: Secondary | ICD-10-CM

## 2022-11-02 LAB — COMPREHENSIVE METABOLIC PANEL
ALT: 29 U/L (ref 0–35)
AST: 49 U/L — ABNORMAL HIGH (ref 0–37)
Albumin: 3.4 g/dL — ABNORMAL LOW (ref 3.5–5.2)
Alkaline Phosphatase: 200 U/L — ABNORMAL HIGH (ref 39–117)
BUN: 12 mg/dL (ref 6–23)
CO2: 29 mEq/L (ref 19–32)
Calcium: 9.2 mg/dL (ref 8.4–10.5)
Chloride: 100 mEq/L (ref 96–112)
Creatinine, Ser: 0.74 mg/dL (ref 0.40–1.20)
GFR: 90.66 mL/min (ref >60.00)
Glucose, Bld: 311 mg/dL — ABNORMAL HIGH (ref 70–99)
Potassium: 3.7 mEq/L (ref 3.5–5.1)
Sodium: 137 mEq/L (ref 135–145)
Total Bilirubin: 1.2 mg/dL (ref 0.2–1.2)
Total Protein: 7.4 g/dL (ref 6.0–8.3)

## 2022-11-02 LAB — HEMOGLOBIN A1C: Hgb A1c MFr Bld: 8.9 % — ABNORMAL HIGH (ref 4.6–6.5)

## 2022-11-02 LAB — CBC
HCT: 39.1 % (ref 36.0–46.0)
Hemoglobin: 13.1 g/dL (ref 12.0–15.0)
MCHC: 33.5 g/dL (ref 30.0–36.0)
MCV: 87.5 fl (ref 78.0–100.0)
Platelets: 170 10*3/uL (ref 150.0–400.0)
RBC: 4.47 Mil/uL (ref 3.87–5.11)
RDW: 13.7 % (ref 11.5–15.5)
WBC: 6.6 10*3/uL (ref 4.0–10.5)

## 2022-11-02 LAB — LIPID PANEL
Cholesterol: 291 mg/dL — ABNORMAL HIGH (ref 0–200)
HDL: 110.9 mg/dL (ref >39.00)
LDL Cholesterol: 161 mg/dL — ABNORMAL HIGH (ref 0–99)
NonHDL: 180.43
Total CHOL/HDL Ratio: 3
Triglycerides: 99 mg/dL (ref 0.0–149.0)
VLDL: 19.8 mg/dL (ref 0.0–40.0)

## 2022-11-02 LAB — MICROALBUMIN / CREATININE URINE RATIO
Creatinine,U: 119.6 mg/dL
Microalb Creat Ratio: 13.3 mg/g (ref 0.0–30.0)
Microalb, Ur: 15.9 mg/dL — ABNORMAL HIGH (ref 0.0–1.9)

## 2022-11-02 LAB — VITAMIN D 25 HYDROXY (VIT D DEFICIENCY, FRACTURES): VITD: 29.3 ng/mL — ABNORMAL LOW (ref 30.00–100.00)

## 2022-11-02 MED ORDER — HYDROCHLOROTHIAZIDE 25 MG PO TABS: 25.0000 mg | ORAL_TABLET | Freq: Every day | ORAL | 3 refills | Status: DC

## 2022-11-02 MED ORDER — VALSARTAN 80 MG PO TABS: 80.0000 mg | ORAL_TABLET | Freq: Every day | ORAL | 3 refills | Status: DC

## 2022-11-02 MED ORDER — GLIPIZIDE ER 10 MG PO TB24: 10.0000 mg | ORAL_TABLET | Freq: Every day | ORAL | 0 refills | Status: DC

## 2022-11-02 NOTE — Telephone Encounter (Signed)
Patient called in stating that [facility] doesn't take her insurance. She her hydrochlorothiazide (HYDRODIURIL) 25 MG tablet and valsartan (DIOVAN) 80 MG tablet sent over to [facility] in [place], [place] at [employer] at [address].

## 2022-11-02 NOTE — Progress Notes (Signed)
Positive microalbumin in urine.   Your A1c is slightly better however not at desired level for diabetic control. I would like her to restart glipizide. Ask if she has ever taken metformin as I would also like for her to start this with her diabetic regimen. Does she have a device at home to check her sugars? Ideally I'd like her checking this daily, fasting with goal <140. We need this to get back to target.if she does not have a device I will send a new RX in for one.   Your liver function is also very high it's very important to get back with your GI doctor. Let me know if you do not get an appointment in the next few weeks.   Vitamin D is on the lower end of normal. Suggest starting otc vitamin D3 2000 IU once daily.   Lastly cholesterol is very high.

## 2022-11-02 NOTE — Patient Instructions (Addendum)
  Welcome to our clinic, I am happy to have you as my new patient. I am excited to continue on this healthcare journey with you. Call back GI dr Havery Moros to get re-consulted with them for an appointment.   ------------------------------------  You should get called about the ultrasound of heart (echocardiogram) ------------------------------------  Stop by the lab prior to leaving today. I will notify you of your results once received.   Please keep in mind Any my chart messages you send have up to a three business day turnaround for a response.  Phone calls may take up to a one full business day turnaround for a  response.   If you need a medication refill I recommend you request it through the pharmacy as this is easiest for [place] rather than sending a message and or phone call.   Due to recent changes in healthcare laws, you may see results of your imaging and/or laboratory studies on MyChart before I have had a chance to review them.  I understand that in some cases there may be results that are confusing or concerning to you. Please understand that not all results are received at the same time and often I may need to interpret multiple results in order to provide you with the best plan of care or course of treatment. Therefore, I ask that you please give me 2 business days to thoroughly review all your results before contacting my office for clarification. Should we see a critical lab result, you will be contacted sooner.   It was a pleasure seeing you today! Please do not hesitate to reach out with any questions and or concerns.  Regards,   Eugenia Pancoast FNP-C

## 2022-11-02 NOTE — Progress Notes (Unsigned)
Established Patient Office Visit  Subjective:  Patient ID: Susan [name], female    DOB: 10-Sep-1966  Age: 57 y.o. MRN: [id number]  CC:  Chief Complaint  Patient presents with   Establish Care    TOC from [facility]    Hypertension    Has not had medications in several weeks.     HPI Susan [name] is here for a transition of care visit.  Prior provider was: [name], FNP     Pt is without acute concerns.   chronic concerns:  HTN: was on lisinopril but ran out as well as HCTZ 25. Has blood pressure machine at home but has not really been taking it. Denies cp pal and or sob.   Primary biliary colangitis   HLD: was taking simvastatin 10 mg, was borderline with cholesterol. It made her feel 'terrible' so she d/c.  Lab Results  Component Value Date   CHOL 291 (H) 11/02/2022   HDL 110.90 11/02/2022   LDLCALC 161 (H) 11/02/2022   TRIG 99.0 11/02/2022   CHOLHDL 3 11/02/2022      Past Medical History:  Diagnosis Date   Allergy    Anemia    Cirrhosis ([place])    Constipation    Contact lens/glasses fitting    wears contacts or glasses   Diabetes mellitus without complication ([place])    Edema of both lower extremities    Food allergy    Gastric varices    GERD (gastroesophageal reflux disease)    occ pepcid ac   Heart murmur    Hyperlipidemia    Hypertension    Joint pain    Periumbilical hernia    Primary biliary cholangitis ([place])    AMA negative PBC   Snores    Vitamin D deficiency     Past Surgical History:  Procedure Laterality Date   DILATION AND CURETTAGE OF UTERUS  11/1995, 10/1997   LEEP     LIVER BIOPSY     TUBAL LIGATION  [id number]   UMBILICAL HERNIA REPAIR N/A 08/11/2013   Procedure: HERNIA REPAIR UMBILICAL ADULT;  Surgeon: Gwenyth Ober, MD;  Location: [facility];  Service: General;  Laterality: N/A;    Family History  Problem Relation Age of Onset   Breast cancer Mother    Schizophrenia Mother    Depression Mother     Uterine cancer Mother    Hypertension Mother    Obesity Mother    Renal Disease Father    Diabetes Father    Other Father        Agent Orange   Lung cancer Maternal Grandmother    Lung cancer Maternal Grandfather    Heart disease Maternal Uncle    Diabetes Other        Aunts and Uncles on both sides   Heart disease Maternal Aunt    Colon cancer Neg Hx    Esophageal cancer Neg Hx    Stomach cancer Neg Hx    Rectal cancer Neg Hx     Social History   Socioeconomic History   Marital status: Married    Spouse name: Dwayne   Number of children: 1   Years of education: Not on file   Highest education level: Not on file  Occupational History   Occupation: Mudlogger of HR for [employer]  Tobacco Use   Smoking status: Never   Smokeless tobacco: Never  Vaping Use   Vaping Use: Never used  Substance and Sexual Activity  Alcohol use: Yes    Comment: wine 2 times a year   Drug use: No   Sexual activity: Yes    Partners: Male    Birth control/protection: Surgical  Other Topics Concern   Not on file  Social History Narrative   Not on file   Social Determinants of Health   Financial Resource Strain: Not on file  Food Insecurity: Not on file  Transportation Needs: Not on file  Physical Activity: Not on file  Stress: Not on file  Social Connections: Not on file  Intimate Partner Violence: Not on file    Outpatient Medications Prior to Visit  Medication Sig Dispense Refill   fluticasone (FLONASE) 50 MCG/ACT nasal spray Place 2 sprays into both nostrils daily. 9.9 mL 0   loratadine (CLARITIN) 10 MG tablet Take 1 tablet (10 mg total) by mouth daily. 20 tablet 0   Multiple Vitamins-Minerals (WOMENS MULTIVITAMIN PO) Take 2 tablets by mouth daily.     ursodiol (ACTIGALL) 500 MG tablet Take 500 mg by mouth 2 (two) times daily. (Patient not taking: Reported on 11/02/2022)     celecoxib (CELEBREX) 100 MG capsule Take 1 capsule (100 mg total) by mouth 2 (two) times daily.  (Patient not taking: Reported on 11/02/2022) 30 capsule 0   chlorpheniramine-HYDROcodone (TUSSIONEX PENNKINETIC ER) 10-8 MG/5ML SUER Take 5 mLs by mouth at bedtime as needed for cough. (Patient not taking: Reported on 11/02/2022) 60 mL 0   glipiZIDE (GLUCOTROL XL) 10 MG 24 hr tablet Take 1 tablet (10 mg total) by mouth daily with breakfast. SCHEDULE FOLLOW UP (Patient not taking: Reported on 11/02/2022) 30 tablet 0   hydrochlorothiazide (HYDRODIURIL) 25 MG tablet TAKE 1 TABLET DAILY (Patient not taking: Reported on 11/02/2022) 90 tablet 3   lisinopril (ZESTRIL) 10 MG tablet TAKE 1 TABLET DAILY (Patient not taking: Reported on 11/02/2022) 90 tablet 3   nadolol (CORGARD) 40 MG tablet Take 1 tablet (40 mg total) by mouth daily. Please schedule an office visit for further refills. Thank you (Patient not taking: Reported on 11/02/2022) 30 tablet 1   OCALIVA 5 MG TABS Take 1 tablet by mouth daily. (Patient not taking: Reported on 11/02/2022)     Semaglutide 3 MG TABS Take 3 mg by mouth daily at 12 noon. (Patient not taking: Reported on 11/02/2022) 30 tablet 0   simvastatin (ZOCOR) 10 MG tablet TAKE 1 TABLET BY MOUTH EVERYDAY AT BEDTIME (Patient not taking: Reported on 11/02/2022) 90 tablet 1   Vitamin D, Ergocalciferol, (DRISDOL) 1.25 MG (50000 UNIT) CAPS capsule Take 1 capsule (50,000 Units total) by mouth every 7 (seven) days. (Patient not taking: Reported on 11/02/2022) 4 capsule 0   No facility-administered medications prior to visit.    Allergies  Allergen Reactions   Grapefruit Concentrate Other (See Comments)    blisters   Sulfa Antibiotics Rash and Other (See Comments)    Severe dehydration   Amlodipine Other (See Comments)    Heart rate fast   Crestor [Rosuvastatin]    Mushroom Extract Complex     Throat swells   Shellfish Allergy     Hives    Simvastatin Other (See Comments)    Brain fog, fatigue, felt 'terrible'.    Strawberry Extract     hives    ROS: Pertinent symptoms negative unless otherwise  noted in HPI      Objective:    Physical Exam Vitals reviewed.  Constitutional:      Appearance: Normal appearance.  Eyes:  General:        Right eye: No discharge.        Left eye: No discharge.     Conjunctiva/sclera: Conjunctivae normal.  Cardiovascular:     Rate and Rhythm: Normal rate.  Pulmonary:     Effort: Pulmonary effort is normal. No respiratory distress.  Musculoskeletal:        General: Normal range of motion.     Cervical back: Normal range of motion.     Right lower leg: 2+ Pitting Edema present.     Left lower leg: 2+ Pitting Edema present.  Neurological:     General: No focal deficit present.     Mental Status: She is alert and oriented to person, place, and time. Mental status is at baseline.  Psychiatric:        Mood and Affect: Mood normal.        Behavior: Behavior normal.        Thought Content: Thought content normal.        Judgment: Judgment normal.       BP (!) 156/98   Pulse 85   Temp (!) 97.3 F (36.3 C) (Temporal)   Ht 4' 11.5" (1.511 m)   Wt 192 lb (87.1 kg)   SpO2 99%   BMI 38.13 kg/m  Wt Readings from Last 3 Encounters:  11/02/22 192 lb (87.1 kg)  02/05/20 210 lb (95.3 kg)  02/01/20 206 lb (93.4 kg)     Health Maintenance Due  Topic Date Due   HIV Screening  Never done   Zoster Vaccines- Shingrix (1 of 2) Never done   OPHTHALMOLOGY EXAM  09/24/2018   FOOT EXAM  02/04/2021   PAP SMEAR-Modifier  04/15/2021    There are no preventive care reminders to display for this patient.  Lab Results  Component Value Date   TSH 1.910 12/21/2019   Lab Results  Component Value Date   WBC 6.6 11/02/2022   HGB 13.1 11/02/2022   HCT 39.1 11/02/2022   MCV 87.5 11/02/2022   PLT 170.0 11/02/2022   Lab Results  Component Value Date   NA 137 11/02/2022   K 3.7 11/02/2022   CO2 29 11/02/2022   GLUCOSE 311 (H) 11/02/2022   BUN 12 11/02/2022   CREATININE 0.74 11/02/2022   BILITOT 1.2 11/02/2022   ALKPHOS 200 (H) 11/02/2022    AST 49 (H) 11/02/2022   ALT 29 11/02/2022   PROT 7.4 11/02/2022   ALBUMIN 3.4 (L) 11/02/2022   CALCIUM 9.2 11/02/2022   GFR 90.66 11/02/2022   Lab Results  Component Value Date   CHOL 291 (H) 11/02/2022   Lab Results  Component Value Date   HDL 110.90 11/02/2022   Lab Results  Component Value Date   LDLCALC 161 (H) 11/02/2022   Lab Results  Component Value Date   TRIG 99.0 11/02/2022   Lab Results  Component Value Date   CHOLHDL 3 11/02/2022   Lab Results  Component Value Date   HGBA1C 8.9 (H) 11/02/2022      Assessment & Plan:   Primary biliary cholangitis ([place]) Assessment & Plan: Noncompliant advised pt need to f/u with GI for management  Hepatic function panel today pending results.    Orders: -     Comprehensive metabolic panel -     Ambulatory referral to Gastroenterology  Gastric varices  Iron deficiency anemia due to chronic blood loss -     CBC  Diabetes mellitus type 2 in obese [facility]) Assessment &  Plan: Ordered hga1c today pending results. Work on diabetic diet and exercise as tolerated. Yearly foot exam, and annual eye exam.  Also ordering urine microalbumin pending results.  Orders: -     Microalbumin / creatinine urine ratio -     Hemoglobin A1c  Essential hypertension Assessment & Plan: Restart valsartan as well as hctz for pedal edema Pt advised of the following:  Continue medication as prescribed. Monitor blood pressure periodically and/or when you feel symptomatic. Goal is <130/90 on average. Ensure that you have rested for 30 minutes prior to checking your blood pressure. Record your readings and bring them to your next visit if necessary.work on a low sodium diet.   Orders: -     Valsartan; Take 1 tablet (80 mg total) by mouth daily.  Dispense: 90 tablet; Refill: 3 -     hydroCHLOROthiazide; Take 1 tablet (25 mg total) by mouth daily.  Dispense: 90 tablet; Refill: 3  Vitamin D deficiency Assessment & Plan: Ordered vitamin d  pending results.    Orders: -     VITAMIN D 25 Hydroxy (Vit-D Deficiency, Fractures)  Elevated LDL cholesterol level -     Lipid panel  Pedal edema Assessment & Plan: Hctz today pending results Cmp pending  Orders: -     ECHOCARDIOGRAM COMPLETE; Future  Heart murmur Assessment & Plan: Ordering echocardiogram awaiting results  Orders: -     ECHOCARDIOGRAM COMPLETE; Future  Type 2 diabetes mellitus without complication, without long-term current use of insulin (H[facility]) -     glipiZIDE ER; Take 1 tablet (10 mg total) by mouth daily with breakfast. SCHEDULE FOLLOW UP  Dispense: 90 tablet; Refill: 0  Hyperlipidemia associated with type 2 diabetes mellitus ([place]) Assessment & Plan: Ordered lipid panel, pending results. Work on low cholesterol diet and exercise as tolerated      Meds ordered this encounter  Medications   valsartan (DIOVAN) 80 MG tablet    Sig: Take 1 tablet (80 mg total) by mouth daily.    Dispense:  90 tablet    Refill:  3    Order Specific Question:   Supervising Provider    Answer:   BEDSOLE, AMY E [[id number]]   hydrochlorothiazide (HYDRODIURIL) 25 MG tablet    Sig: Take 1 tablet (25 mg total) by mouth daily.    Dispense:  90 tablet    Refill:  3    Order Specific Question:   Supervising Provider    Answer:   BEDSOLE, AMY E [[id number]]   glipiZIDE (GLUCOTROL XL) 10 MG 24 hr tablet    Sig: Take 1 tablet (10 mg total) by mouth daily with breakfast. SCHEDULE FOLLOW UP    Dispense:  90 tablet    Refill:  0    Must schedule follow up    Order Specific Question:   Supervising Provider    Answer:   Diona Browner, AMY E [[id number]]    Follow-up: Return in about 2 weeks (around 11/16/2022) for f/u blood pressure.    Susan Pancoast, FNP

## 2022-11-02 NOTE — Progress Notes (Signed)
Dr. Havery Moros,   This pt has not seen you since 2021 however with PBC and recent labwork shows elevated AST and alk phos at 200. I have advised her to call your office to make f/u appt as overdue, but my question is, anything you'd like me to get started for her prior to appt with you? She was on nadolol and ursoldiol.

## 2022-11-05 ENCOUNTER — Telehealth: Payer: Self-pay

## 2022-11-05 DIAGNOSIS — I864 Gastric varices: Secondary | ICD-10-CM | POA: Insufficient documentation

## 2022-11-05 DIAGNOSIS — D500 Iron deficiency anemia secondary to blood loss (chronic): Secondary | ICD-10-CM | POA: Insufficient documentation

## 2022-11-05 DIAGNOSIS — R011 Cardiac murmur, unspecified: Secondary | ICD-10-CM | POA: Insufficient documentation

## 2022-11-05 DIAGNOSIS — K743 Primary biliary cirrhosis: Secondary | ICD-10-CM

## 2022-11-05 DIAGNOSIS — E559 Vitamin D deficiency, unspecified: Secondary | ICD-10-CM | POA: Insufficient documentation

## 2022-11-05 DIAGNOSIS — R600 Localized edema: Secondary | ICD-10-CM | POA: Insufficient documentation

## 2022-11-05 DIAGNOSIS — K7460 Unspecified cirrhosis of liver: Secondary | ICD-10-CM

## 2022-11-05 DIAGNOSIS — E119 Type 2 diabetes mellitus without complications: Secondary | ICD-10-CM | POA: Insufficient documentation

## 2022-11-05 MED ORDER — HYDROCHLOROTHIAZIDE 25 MG PO TABS: 25.0000 mg | ORAL_TABLET | Freq: Every day | ORAL | 3 refills | Status: DC

## 2022-11-05 MED ORDER — VALSARTAN 80 MG PO TABS: 80.0000 mg | ORAL_TABLET | Freq: Every day | ORAL | 3 refills | Status: DC

## 2022-11-05 NOTE — Telephone Encounter (Signed)
Armbruster, Carlota Raspberry, MD  Yevette Edwards, RN [place] can you help with the following: - she needs an office follow up with me if you can coordindate - can you help schedule her a RUQ [place], she is very overdue. Thanks

## 2022-11-05 NOTE — Assessment & Plan Note (Signed)
Ordered vitamin d pending results.

## 2022-11-05 NOTE — Addendum Note (Signed)
Addended by: Eugenia Pancoast on: 11/05/2022 02:15 PM   Modules accepted: Orders

## 2022-11-05 NOTE — Assessment & Plan Note (Signed)
Ordered lipid panel, pending results. Work on low cholesterol diet and exercise as tolerated ? ?

## 2022-11-05 NOTE — Assessment & Plan Note (Signed)
Hctz today pending results Cmp pending

## 2022-11-05 NOTE — Telephone Encounter (Signed)
Refilled

## 2022-11-05 NOTE — Assessment & Plan Note (Signed)
Noncompliant advised pt need to f/u with GI for management  Hepatic function panel today pending results.

## 2022-11-05 NOTE — Progress Notes (Signed)
Thank you so much for your recommendations. I will notify pt of the following. Appreciate your prompt response.

## 2022-11-05 NOTE — Assessment & Plan Note (Signed)
Ordering echocardiogram awaiting results

## 2022-11-05 NOTE — Assessment & Plan Note (Signed)
Restart valsartan as well as hctz for pedal edema Pt advised of the following:  Continue medication as prescribed. Monitor blood pressure periodically and/or when you feel symptomatic. Goal is <130/90 on average. Ensure that you have rested for 30 minutes prior to checking your blood pressure. Record your readings and bring them to your next visit if necessary.work on a low sodium diet.

## 2022-11-05 NOTE — Telephone Encounter (Signed)
Called and spoke with patient. Pt has been scheduled for an appt with Dr. Havery Moros on Tuesday, 01/22/23 at 8:10 am. Patient is aware that I am ordering a RUQ [place] and knows to expect a call from radiology scheduling to set up her appt. Pt verbalized understanding and had no concerns at the end of the call.   RUQ [place] order in epic. Secure staff message sent to radiology scheduling to contact patient to set up an appt.

## 2022-11-05 NOTE — Progress Notes (Signed)
Can we please call pt and advise her that I reached out to her Gi (Dr.Armbruster) and that he would also like her to schedule f/u with Windell Hummingbird (the hepatologist specialist). He stated she is much overdue for both appointments. Advise her as well GI office is going to be reaching out to her to schedule an abdominal u/s prior to her visit .

## 2022-11-05 NOTE — Assessment & Plan Note (Signed)
Ordered hga1c today pending results. Work on diabetic diet and exercise as tolerated. Yearly foot exam, and annual eye exam.  Also ordering urine microalbumin pending results.

## 2022-11-07 NOTE — Telephone Encounter (Signed)
RUQ [place] scheduled for Tuesday, 11/13/22 at 9 am.

## 2022-11-13 ENCOUNTER — Ambulatory Visit (HOSPITAL_COMMUNITY)
Admission: RE | Admit: 2022-11-13 | Discharge: 2022-11-13 | Disposition: A | Discharge status: Home / Self Care | Payer: 59 | Source: Ambulatory Visit | Attending: Gastroenterology | Admitting: Gastroenterology

## 2022-11-13 DIAGNOSIS — K7460 Unspecified cirrhosis of liver: Secondary | ICD-10-CM | POA: Diagnosis present

## 2022-11-13 DIAGNOSIS — K743 Primary biliary cirrhosis: Secondary | ICD-10-CM | POA: Diagnosis not present

## 2023-01-04 ENCOUNTER — Telehealth: Payer: Self-pay

## 2023-01-04 DIAGNOSIS — I10 Essential (primary) hypertension: Secondary | ICD-10-CM

## 2023-01-04 MED ORDER — HYDROCHLOROTHIAZIDE 25 MG PO TABS: 25.0000 mg | ORAL_TABLET | Freq: Every day | ORAL | 3 refills | Status: DC

## 2023-01-04 MED ORDER — VALSARTAN 80 MG PO TABS: 80.0000 mg | ORAL_TABLET | Freq: Every day | ORAL | 3 refills | Status: DC

## 2023-01-04 NOTE — Telephone Encounter (Signed)
Request to change pharmacy from local to mail and send in rx for   valsartan (DIOVAN) 80 MG tablet    hydrochlorothiazide (HYDRODIURIL) 25 MG tablet   sent

## 2023-01-22 ENCOUNTER — Other Ambulatory Visit (INDEPENDENT_AMBULATORY_CARE_PROVIDER_SITE_OTHER): Payer: 59

## 2023-01-22 ENCOUNTER — Ambulatory Visit (INDEPENDENT_AMBULATORY_CARE_PROVIDER_SITE_OTHER): Payer: 59 | Admitting: Gastroenterology

## 2023-01-22 ENCOUNTER — Encounter: Payer: Self-pay | Admitting: Gastroenterology

## 2023-01-22 VITALS — BP 120/66 | HR 70 | Ht 59.75 in | Wt 187.0 lb

## 2023-01-22 DIAGNOSIS — K7460 Unspecified cirrhosis of liver: Secondary | ICD-10-CM

## 2023-01-22 DIAGNOSIS — I864 Gastric varices: Secondary | ICD-10-CM | POA: Diagnosis not present

## 2023-01-22 DIAGNOSIS — K743 Primary biliary cirrhosis: Secondary | ICD-10-CM | POA: Diagnosis not present

## 2023-01-22 LAB — PROTIME-INR
INR: 1.2 ratio — ABNORMAL HIGH (ref 0.8–1.0)
Prothrombin Time: 12.7 s (ref 9.6–13.1)

## 2023-01-22 LAB — HEPATIC FUNCTION PANEL
ALT: 34 U/L (ref 0–35)
AST: 51 U/L — ABNORMAL HIGH (ref 0–37)
Albumin: 3.3 g/dL — ABNORMAL LOW (ref 3.5–5.2)
Alkaline Phosphatase: 232 U/L — ABNORMAL HIGH (ref 39–117)
Bilirubin, Direct: 0.4 mg/dL — ABNORMAL HIGH (ref 0.0–0.3)
Total Bilirubin: 1.2 mg/dL (ref 0.2–1.2)
Total Protein: 7.7 g/dL (ref 6.0–8.3)

## 2023-01-22 MED ORDER — URSODIOL 300 MG PO CAPS: 600.0000 mg | ORAL_CAPSULE | Freq: Two times a day (BID) | ORAL | 5 refills | Status: DC

## 2023-01-22 MED ORDER — CARVEDILOL 3.125 MG PO TABS: 3.1250 mg | ORAL_TABLET | Freq: Two times a day (BID) | ORAL | 5 refills | Status: DC

## 2023-01-22 NOTE — Progress Notes (Signed)
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  I have not seen her since January 2021.  Recall she was diagnosed with her liver disease in January 2019. ANA positive 1:160, SMA 35, and IgG 2094, AMA negative. Liver biopsy done 10/22/2017 - fibrotic changes noted 3-4/4 noted, AMA negative PBC versus PSC versus drug reaction were noted. MRCP 11/28/2017 - suggestion of cirrhosis, bile ducts normal, no evidence of PSC, suggestion of gastric varices. She was referred to see Hepatology, Dr. Hamilton Capri / Annamarie Major - thought to have AMA negative PBC, started Ursodiol 500mg  BID. Her liver enzymes improved on this regimen. EGD and colonoscopy done in 2019, small gastric varices, was placed on nadolol.   She unfortunately has not been seen by our office or hepatology for almost 3 years now.  Fortunately she has been feeling pretty well and no significant decompensations that we can tell.  She has no jaundice, no ascites, no GI bleeding, no encephalopathy.  She has occasional pruritus but she thought this was due to a shower gel which she switched and seems to be getting better.  She does think that her father has been diagnosed with liver disease and may have PBC as well.  She had struggled to lose weight at her last visit, she has successfully lost weight with significant dietary changes over the past few years, her BMI went from over 40 now to 36, she has done a good job with weight reduction.  She had been taking ursodiol as prescribed, states she ran out a few months ago however.  She did not have any side effects from it and tolerated it okay.  She had also been taking nadolol but ran out of that earlier this month.  She also endorses taking a vitamin D supplement.  I asked her if she was taking Oscaliva, and she is not. She was prescribed this by Hepatology in 12/2019 for persistently  elevated AP despite ursodiol.  She does not recall how long she was on it, or when she stopped it.  She has a hard time recalling this.  Her last LFTs were done in February - AP was 200, AST 49, ALT 29. Bili is 1.2. Her platelet count is 170.  She did have an ultrasound of her liver in February which showed no hepatomas.  She is otherwise feeling well without complaints at this time. We immunized her to hepatitis A and B in the past   Prior workup:  EGD 07/31/18 -  - The exam of the esophagus was otherwise normal. No esophageal varices. - Possible type 1 isolated gastric varices (IGV1, varices located in the fundus) were found in the cardia / fundus. The fundus was angulated. Despite the endoscope maximally retroflexed in multiple angles, the entire cardia could not be well visualized. - The exam of the stomach was otherwise normal. - The duodenal bulb and second portion of the duodenum were normal.     Colonoscopy 07/31/18 - The perianal and digital rectal examinations were normal. - A 3 mm polyp was found in the cecum. The polyp was sessile. The polyp was removed with a cold snare. Resection and retrieval were complete. - A diminutive polyp was found in the cecum. The polyp was sessile. It was not able to be grasped with the snare. The polyp was removed with a cold biopsy forceps. Resection and retrieval were complete. -  Anal papilla(e) were hypertrophied. - Internal hemorrhoids were found during retroflexion, with a suspected rectal varix. - There were prominent lymphoid aggregates in the right colon. The exam was otherwise without abnormality.   Inflammatory polyps, no adenomas    RUQ [place] 11/13/2022: IMPRESSION: 1. Cirrhotic liver with no mass identified. 2. Stones and sludge identified in the gallbladder. The gallbladder is otherwise normal  Vitamin D 29.30  Lab Results  Component Value Date   ALT 29 11/02/2022   AST 49 (H) 11/02/2022   ALKPHOS 200 (H) 11/02/2022    BILITOT 1.2 11/02/2022   Past Medical History:  Diagnosis Date   Allergy    Anemia    Cirrhosis (HCC)    Constipation    Contact lens/glasses fitting    wears contacts or glasses   Diabetes mellitus without complication (HCC)    Edema of both lower extremities    Food allergy    Gastric varices    GERD (gastroesophageal reflux disease)    occ pepcid ac   Heart murmur    Hyperlipidemia    Hypertension    Joint pain    Periumbilical hernia    Primary biliary cholangitis (HCC)    AMA negative PBC   Snores    Vitamin D deficiency      Past Surgical History:  Procedure Laterality Date   DILATION AND CURETTAGE OF UTERUS  11/1995, 10/1997   LEEP     LIVER BIOPSY     TUBAL LIGATION  10/1997   UMBILICAL HERNIA REPAIR N/A 08/11/2013   Procedure: HERNIA REPAIR UMBILICAL ADULT;  Surgeon: Cherylynn Ridges, MD;  Location: [facility];  Service: General;  Laterality: N/A;   Family History  Problem Relation Age of Onset   Breast cancer Mother    Schizophrenia Mother    Depression Mother    Uterine cancer Mother    Hypertension Mother    Obesity Mother    Renal Disease Father    Diabetes Father    Other Father        Agent Orange   Lung cancer Maternal Grandmother    Lung cancer Maternal Grandfather    Heart disease Maternal Uncle    Diabetes Other        Aunts and Uncles on both sides   Heart disease Maternal Aunt    Colon cancer Neg Hx    Esophageal cancer Neg Hx    Stomach cancer Neg Hx    Rectal cancer Neg Hx    Social History   Tobacco Use   Smoking status: Never   Smokeless tobacco: Never  Vaping Use   Vaping Use: Never used  Substance Use Topics   Alcohol use: Yes    Comment: wine 2 times a year   Drug use: No   Current Outpatient Medications  Medication Sig Dispense Refill   fluticasone (FLONASE) 50 MCG/ACT nasal spray Place 2 sprays into both nostrils daily. 9.9 mL 0   glipiZIDE (GLUCOTROL XL) 10 MG 24 hr tablet Take 1 tablet (10 mg total) by  mouth daily with breakfast. SCHEDULE FOLLOW UP 90 tablet 0   hydrochlorothiazide (HYDRODIURIL) 25 MG tablet Take 1 tablet (25 mg total) by mouth daily. 90 tablet 3   loratadine (CLARITIN) 10 MG tablet Take 1 tablet (10 mg total) by mouth daily. (Patient not taking: Reported on 01/22/2023) 20 tablet 0   Multiple Vitamins-Minerals (WOMENS MULTIVITAMIN PO) Take 2 tablets by mouth daily.     valsartan (DIOVAN) 80 MG tablet  Take 1 tablet (80 mg total) by mouth daily. 90 tablet 3   No current facility-administered medications for this visit.   Allergies  Allergen Reactions   Grapefruit Concentrate Other (See Comments)    blisters   Sulfa Antibiotics Rash and Other (See Comments)    Severe dehydration   Amlodipine Other (See Comments)    Heart rate fast   Crestor [Rosuvastatin]    Mushroom Extract Complex     Throat swells   Shellfish Allergy     Hives    Simvastatin Other (See Comments)    Brain fog, fatigue, felt 'terrible'.    Strawberry Extract     hives     Review of Systems: All systems reviewed and negative except where noted in HPI.   Labs per HPI  Physical Exam: BP 120/66   Pulse 70   Ht 4' 11.75" (1.518 m)   Wt 187 lb (84.8 kg)   BMI 36.83 kg/m  Constitutional: Pleasant,well-developed, female in no acute distress. HEENT: Normocephalic and atraumatic. Conjunctivae are normal. No scleral icterus. Neck supple.  Cardiovascular: Normal rate, regular rhythm.  Pulmonary/chest: Effort normal and breath sounds normal. No wheezing, rales or rhonchi. Abdominal: Soft, nondistended, nontender.  There are no masses palpable.  Extremities: no edema Lymphadenopathy: No cervical adenopathy noted. Neurological: Alert and oriented to person place and time. Skin: Skin is warm and dry. No rashes noted. Psychiatric: Normal mood and affect. Behavior is normal.   ASSESSMENT: 57 y.o. female here for assessment of the following  1. Primary biliary cholangitis (HCC)   2. Cirrhosis of  liver without ascites, unspecified hepatic cirrhosis type (HCC)   3. Gastric varices    AMA negative PBC with possible overlap AIH causing cirrhosis. Gastric varices which have never bled, no other decompensations. Component of AIH has not been treated, monitoring based on parameters historically. PBC managed with Ursodiol and Ocaliva added per Hepatology at that last visit but unclear how long she took this and when she stopped. Now off Ursodiol for a few months and recently ran out of her nadolol.   I discussed the importance of maintaining continuity with [place] in her hepatology team to make sure her cirrhosis has not progressed.  We discussed risks for progressive decompensated cirrhosis and risks for [facility] moving forward.  I recommend we resume ursodiol dosed at 13 to 15 mg/kg, she is agreeable to this, refilled and will dose for 600 mg twice daily.  I also will restart her Coreg 3.125 mg twice daily given her history of gastric varices.  As long as she is on this we do not need to rescreen her for varices.  I do think she needs follow-up with hepatology to determine if she needs to go back on [place] or not.  I will repeat her LFTs today, check an IgG, check an INR, and also AFP for HCC screening.  We discussed her risk for osteoporosis, her DEXA scan was last done in 2019, will repeat a DEXA scan at this time.  I also recommend vitamin D supplementation over-the-counter.  She will otherwise continue to work on weight loss.  I will touch base with her hepatology team to see if they can get her back in for a follow-up visit.  She agrees and understands / agrees to plan.   PLAN: - resume Ursodiol 600mg  BID  - resume Coreg 3.125mg  BID - vitamin D OTC - lab today - LFTs, INR, AFP, IgG - order DEXA scan - rule out osteoporosis -  recall for RUQ [place] in 6 months from last exam (due in August) - follow up with [facility] /Hepatology - go back on [place]? Defer to them and autoimmune overlap - continue  to work on weight loss - BMI improved since last visit - f/u 6 months or sooner with issues  Harlin Rain, MD [facility]

## 2023-01-22 NOTE — Patient Instructions (Addendum)
If your blood pressure at your visit was 140/90 or greater, please contact your primary care physician to follow up on this. ______________________________________________________  If you are age 56 or older, your body mass index should be between 23-30. Your Body mass index is 36.83 kg/m. If this is out of the aforementioned range listed, please consider follow up with your Primary Care Provider.  If you are age 55 or younger, your body mass index should be between 19-25. Your Body mass index is 36.83 kg/m. If this is out of the aformentioned range listed, please consider follow up with your Primary Care Provider.  ________________________________________________________  The [facility] providers would like to encourage you to use [facility] to communicate with providers for non-urgent requests or questions.  Due to long hold times on the telephone, sending your provider a message by [facility] may be a faster and more efficient way to get a response.  Please allow 48 business hours for a response.  Please remember that this is for non-urgent requests.  _______________________________________________________  Due to recent changes in healthcare laws, you may see the results of your imaging and laboratory studies on MyChart before your provider has had a chance to review them.  We understand that in some cases there may be results that are confusing or concerning to you. Not all laboratory results come back in the same time frame and the provider may be waiting for multiple results in order to interpret others.  Please give [place] 48 hours in order for your provider to thoroughly review all the results before contacting the office for clarification of your results.    Please go to the lab in the basement of our building to have lab work done as you leave today. Hit "B" for basement when you get on the elevator.  When the doors open the lab is on your left.  We will call you with the results. Thank you.    Please go to the lab in the basement of our building to have a Bone Density Scan done as you leave today. Hit "B" for basement when you get on the elevator.  When the doors open Radiology is straight ahead.   We have sent the following medications to your pharmacy for you to pick up at your convenience: Ursodiol 600 mg: Take twice a day Coreg 3.125mg : Take twice a day  Please follow up with Annamarie Major.  You will be due for an ultrasound of your liver in August.  We will remind you when it it time to schedule.  Please follow up with [place] in 6 months.  Thank you for entrusting me with your care and for choosing [facility], Dr. Ileene Patrick

## 2023-01-23 LAB — IGG: IgG (Immunoglobin G), Serum: 2310 mg/dL — ABNORMAL HIGH (ref 600–1640)

## 2023-01-24 ENCOUNTER — Telehealth: Payer: Self-pay

## 2023-01-24 LAB — AFP TUMOR MARKER: AFP-Tumor Marker: 6.9 ng/mL — ABNORMAL HIGH

## 2023-01-24 NOTE — Telephone Encounter (Signed)
Referral faxed to [facility] at [phone]

## 2023-01-24 NOTE — Telephone Encounter (Signed)
Patient has an appointment on 04-17-23 at 2:15pm.

## 2023-01-25 ENCOUNTER — Ambulatory Visit (INDEPENDENT_AMBULATORY_CARE_PROVIDER_SITE_OTHER)
Admission: RE | Admit: 2023-01-25 | Discharge: 2023-01-25 | Disposition: A | Discharge status: Home / Self Care | Payer: 59 | Source: Ambulatory Visit | Attending: Gastroenterology | Admitting: Gastroenterology

## 2023-01-25 DIAGNOSIS — I864 Gastric varices: Secondary | ICD-10-CM

## 2023-01-25 DIAGNOSIS — Z13820 Encounter for screening for osteoporosis: Secondary | ICD-10-CM

## 2023-01-25 DIAGNOSIS — K7460 Unspecified cirrhosis of liver: Secondary | ICD-10-CM

## 2023-01-25 DIAGNOSIS — K743 Primary biliary cirrhosis: Secondary | ICD-10-CM

## 2023-01-30 ENCOUNTER — Encounter: Payer: Self-pay | Admitting: Gastroenterology

## 2023-01-31 ENCOUNTER — Other Ambulatory Visit: Payer: Self-pay

## 2023-01-31 ENCOUNTER — Other Ambulatory Visit: Payer: Self-pay | Admitting: Family

## 2023-01-31 ENCOUNTER — Other Ambulatory Visit (HOSPITAL_COMMUNITY): Payer: Self-pay

## 2023-01-31 ENCOUNTER — Encounter: Payer: Self-pay | Admitting: Pharmacist

## 2023-01-31 DIAGNOSIS — E119 Type 2 diabetes mellitus without complications: Secondary | ICD-10-CM

## 2023-01-31 MED ORDER — NADOLOL 40 MG PO TABS: 40.0000 mg | ORAL_TABLET | Freq: Every day | ORAL | 3 refills | Status: DC

## 2023-01-31 MED ORDER — GLIPIZIDE ER 10 MG PO TB24
10.0000 mg | ORAL_TABLET | Freq: Every day | ORAL | 0 refills | Status: DC
Start: 2023-01-31 — End: 2023-05-07
  Filled 2023-01-31 – 2023-04-18 (×2): qty 30, 30d supply, fill #0

## 2023-02-05 ENCOUNTER — Other Ambulatory Visit: Payer: Self-pay

## 2023-02-26 ENCOUNTER — Other Ambulatory Visit: Payer: Self-pay

## 2023-02-26 MED ORDER — NADOLOL 40 MG PO TABS: 40.0000 mg | ORAL_TABLET | Freq: Every day | ORAL | 1 refills | Status: DC

## 2023-02-26 MED ORDER — URSODIOL 300 MG PO CAPS: 600.0000 mg | ORAL_CAPSULE | Freq: Two times a day (BID) | ORAL | 1 refills | Status: DC

## 2023-03-29 ENCOUNTER — Encounter: Payer: Self-pay | Admitting: Gastroenterology

## 2023-04-17 ENCOUNTER — Other Ambulatory Visit: Payer: Self-pay | Admitting: Nurse Practitioner

## 2023-04-17 DIAGNOSIS — K7469 Other cirrhosis of liver: Secondary | ICD-10-CM

## 2023-04-17 DIAGNOSIS — I864 Gastric varices: Secondary | ICD-10-CM

## 2023-04-17 DIAGNOSIS — K743 Primary biliary cirrhosis: Secondary | ICD-10-CM

## 2023-04-18 ENCOUNTER — Other Ambulatory Visit: Payer: Self-pay

## 2023-04-18 ENCOUNTER — Other Ambulatory Visit (HOSPITAL_COMMUNITY): Payer: Self-pay

## 2023-04-23 ENCOUNTER — Other Ambulatory Visit: Payer: Self-pay

## 2023-04-24 ENCOUNTER — Telehealth: Payer: Self-pay

## 2023-04-24 NOTE — Telephone Encounter (Signed)
-----   Message from [place] sent at 04/23/2023  2:18 PM EDT ----- Received recent labs from hepatology.  Pt with every elevated glucose.  Needs appt soon as possible, can be a few days for next opening to go over glucose levels. Pt was due to be seen back in February but did not come in.

## 2023-04-24 NOTE — Telephone Encounter (Signed)
Left message to call the office back regarding the elevated glucose message below.

## 2023-04-24 NOTE — Telephone Encounter (Signed)
Left message to call the office back to schedule an appointment for elevated glucose.

## 2023-04-25 ENCOUNTER — Ambulatory Visit
Admission: RE | Admit: 2023-04-25 | Discharge: 2023-04-25 | Disposition: A | Discharge status: Home / Self Care | Payer: 59 | Source: Ambulatory Visit | Attending: Nurse Practitioner | Admitting: Nurse Practitioner

## 2023-04-25 ENCOUNTER — Telehealth: Payer: Self-pay | Admitting: *Deleted

## 2023-04-25 DIAGNOSIS — I864 Gastric varices: Secondary | ICD-10-CM

## 2023-04-25 DIAGNOSIS — K743 Primary biliary cirrhosis: Secondary | ICD-10-CM

## 2023-04-25 DIAGNOSIS — K7469 Other cirrhosis of liver: Secondary | ICD-10-CM

## 2023-04-25 NOTE — Telephone Encounter (Signed)
-----   Message from [place] sent at 04/23/2023  2:18 PM EDT ----- Received recent labs from hepatology.  Pt with every elevated glucose.  Needs appt soon as possible, can be a few days for next opening to go over glucose levels. Pt was due to be seen back in February but did not come in.

## 2023-04-25 NOTE — Telephone Encounter (Signed)
Left message for Susan to return call to office.  If patient calls back, she needs to schedule office visit with Tabitha for very elevated blood sugar.

## 2023-04-29 NOTE — Telephone Encounter (Signed)
This is ok as long as pt is not currently symptomatic ( extreme thirst, urinary frequency increasing and or unable to void, dizziness, increased fatigue/lethargy, nausea/vomiting, extreme hunger)  If she has glucometer at home I would like her checking fasting in am every am. Goal <140

## 2023-04-29 NOTE — Telephone Encounter (Signed)
Patient set up visit for 8/13 is that to far out? Do you need to see before then?

## 2023-04-29 NOTE — Telephone Encounter (Signed)
Called patient reviewed all information with patient. Denies any symptoms listed. She does not have glucometer at home. If any symptoms do start she is out of town until her appointment but will be seen at urgent care where she is.

## 2023-04-30 NOTE — Telephone Encounter (Signed)
Great thank you. This is reasonable.

## 2023-05-07 ENCOUNTER — Ambulatory Visit: Payer: 59 | Admitting: Family

## 2023-05-07 VITALS — BP 124/74 | HR 58 | Temp 97.7°F | Ht 59.75 in | Wt 189.2 lb

## 2023-05-07 DIAGNOSIS — T466X5A Adverse effect of antihyperlipidemic and antiarteriosclerotic drugs, initial encounter: Secondary | ICD-10-CM

## 2023-05-07 DIAGNOSIS — M791 Myalgia, unspecified site: Secondary | ICD-10-CM | POA: Diagnosis not present

## 2023-05-07 DIAGNOSIS — I10 Essential (primary) hypertension: Secondary | ICD-10-CM

## 2023-05-07 DIAGNOSIS — R600 Localized edema: Secondary | ICD-10-CM

## 2023-05-07 DIAGNOSIS — E119 Type 2 diabetes mellitus without complications: Secondary | ICD-10-CM | POA: Diagnosis not present

## 2023-05-07 DIAGNOSIS — E559 Vitamin D deficiency, unspecified: Secondary | ICD-10-CM | POA: Diagnosis not present

## 2023-05-07 DIAGNOSIS — Z91148 Patient's other noncompliance with medication regimen for other reason: Secondary | ICD-10-CM | POA: Insufficient documentation

## 2023-05-07 DIAGNOSIS — Z7984 Long term (current) use of oral hypoglycemic drugs: Secondary | ICD-10-CM

## 2023-05-07 DIAGNOSIS — Z789 Other specified health status: Secondary | ICD-10-CM

## 2023-05-07 DIAGNOSIS — E785 Hyperlipidemia, unspecified: Secondary | ICD-10-CM | POA: Diagnosis not present

## 2023-05-07 DIAGNOSIS — E1169 Type 2 diabetes mellitus with other specified complication: Secondary | ICD-10-CM | POA: Diagnosis not present

## 2023-05-07 LAB — LIPID PANEL
Cholesterol: 265 mg/dL — ABNORMAL HIGH (ref 0–200)
HDL: 86.5 mg/dL (ref >39.00)
LDL Cholesterol: 153 mg/dL — ABNORMAL HIGH (ref 0–99)
NonHDL: 178.28
Total CHOL/HDL Ratio: 3
Triglycerides: 127 mg/dL (ref 0.0–149.0)
VLDL: 25.4 mg/dL (ref 0.0–40.0)

## 2023-05-07 LAB — VITAMIN D 25 HYDROXY (VIT D DEFICIENCY, FRACTURES): VITD: 62.47 ng/mL (ref 30.00–100.00)

## 2023-05-07 LAB — HEMOGLOBIN A1C: Hgb A1c MFr Bld: 11 % — ABNORMAL HIGH (ref 4.6–6.5)

## 2023-05-07 MED ORDER — VALSARTAN 80 MG PO TABS
80.0000 mg | ORAL_TABLET | Freq: Every day | ORAL | 0 refills | Status: DC
Start: 2023-05-07 — End: 2024-01-07

## 2023-05-07 MED ORDER — HYDROCHLOROTHIAZIDE 25 MG PO TABS: 25.0000 mg | ORAL_TABLET | Freq: Every day | ORAL | 0 refills | Status: DC

## 2023-05-07 MED ORDER — GLIPIZIDE ER 10 MG PO TB24
10.0000 mg | ORAL_TABLET | Freq: Every day | ORAL | 3 refills | Status: DC
Start: 2023-05-07 — End: 2023-05-07

## 2023-05-07 MED ORDER — GLIPIZIDE ER 10 MG PO TB24: 10.0000 mg | ORAL_TABLET | Freq: Every day | ORAL | 0 refills | Status: DC

## 2023-05-07 NOTE — Patient Instructions (Signed)
A referral was placed today for diabetic educator.  Please let us know if you have not heard back within 2 weeks about the referral.  Stop by the lab prior to leaving today. I will notify you of your results once received.

## 2023-05-07 NOTE — Assessment & Plan Note (Signed)
Ordered lipid panel, pending results. Work on low cholesterol diet and exercise as tolerated long d/w pt on need to work on controlling this, and also it may have genetic inheritance. Ordered LPA as well to evaluate risk. Long d/w pt on calcifications, aortic stenosis etc. We can consider repatha since statin intolerant

## 2023-05-07 NOTE — Assessment & Plan Note (Signed)
Long d/w pt on non compliance, to f/u and call when unable to get medication so we can get diabetes to goal and not have uncontrolled.

## 2023-05-07 NOTE — Assessment & Plan Note (Signed)
Stable

## 2023-05-07 NOTE — Progress Notes (Signed)
Established Patient Office Visit  Subjective:      CC:  Chief Complaint  Patient presents with   Blood Sugar Problem    Glucose 428 on 04/17/23 was not fasting.     HPI: Susan [name] is a 57 y.o. female presenting on 05/07/2023 for Blood Sugar Problem (Glucose 428 on 04/17/23 was not fasting. )  DM2: liked sweets and carbs, has tried to really cut down on this intake and increase water intake. Unfortunately she did not receive glipizide to start and has not started since last visit.  Lab Results  Component Value Date   HGBA1C 11.0 (H) 05/07/2023   Positive microalbumin: 11/02/22,   Elevated calcium: not taking calcium supplements and or tums daily.  Does have vitamin d def, has been taking daily vitamin D but ran out about one week ago.   Hyperlipidemia: was on statins in the past, was not tolerating them.  She didn't like how they made her feel. She is trying to really work on her diet.  Lab Results  Component Value Date   CHOL 265 (H) 05/07/2023   HDL 86.50 05/07/2023   LDLCALC 153 (H) 05/07/2023   TRIG 127.0 05/07/2023   CHOLHDL 3 05/07/2023   Wt Readings from Last 3 Encounters:  05/07/23 189 lb 3.2 oz (85.8 kg)  01/22/23 187 lb (84.8 kg)  11/02/22 192 lb (87.1 kg)         Social history:  Relevant past medical, surgical, family and social history reviewed and updated as indicated. Interim medical history since our last visit reviewed.  Allergies and medications reviewed and updated.  DATA REVIEWED: CHART IN EPIC     ROS: Negative unless specifically indicated above in HPI.    Current Outpatient Medications:    fluticasone (FLONASE) 50 MCG/ACT nasal spray, Place 2 sprays into both nostrils daily., Disp: 9.9 mL, Rfl: 0   loratadine (CLARITIN) 10 MG tablet, Take 1 tablet (10 mg total) by mouth daily., Disp: 20 tablet, Rfl: 0   Multiple Vitamins-Minerals (WOMENS MULTIVITAMIN PO), Take 2 tablets by mouth daily., Disp: , Rfl:    nadolol (CORGARD) 40  MG tablet, Take 1 tablet (40 mg total) by mouth daily., Disp: 90 tablet, Rfl: 1   ursodiol (ACTIGALL) 300 MG capsule, Take 2 capsules (600 mg total) by mouth 2 (two) times daily., Disp: 360 capsule, Rfl: 1   glipiZIDE (GLUCOTROL XL) 10 MG 24 hr tablet, Take 1 tablet (10 mg total) by mouth daily with breakfast. SCHEDULE FOLLOW UP, Disp: 30 tablet, Rfl: 0   hydrochlorothiazide (HYDRODIURIL) 25 MG tablet, Take 1 tablet (25 mg total) by mouth daily., Disp: 30 tablet, Rfl: 0   valsartan (DIOVAN) 80 MG tablet, Take 1 tablet (80 mg total) by mouth daily., Disp: 30 tablet, Rfl: 0      Objective:    BP 124/74   Pulse (!) 58   Temp 97.7 F (36.5 C) (Oral)   Ht 4' 11.75" (1.518 m)   Wt 189 lb 3.2 oz (85.8 kg)   SpO2 98%   BMI 37.26 kg/m   Wt Readings from Last 3 Encounters:  05/07/23 189 lb 3.2 oz (85.8 kg)  01/22/23 187 lb (84.8 kg)  11/02/22 192 lb (87.1 kg)    Physical Exam Constitutional:      General: She is not in acute distress.    Appearance: Normal appearance. She is obese. She is not ill-appearing, toxic-appearing or diaphoretic.  HENT:     Head: Normocephalic.  Cardiovascular:  Rate and Rhythm: Normal rate and regular rhythm.     Heart sounds: Murmur heard.  Pulmonary:     Effort: Pulmonary effort is normal.     Breath sounds: Normal breath sounds.  Musculoskeletal:        General: Normal range of motion.     Right lower leg: 1+ Pitting Edema present.     Left lower leg: 1+ Pitting Edema present.  Neurological:     General: No focal deficit present.     Mental Status: She is alert and oriented to person, place, and time. Mental status is at baseline.  Psychiatric:        Mood and Affect: Mood normal.        Behavior: Behavior normal.        Thought Content: Thought content normal.        Judgment: Judgment normal.           Assessment & Plan:  Statin intolerance  Type 2 diabetes mellitus without complication, without long-term current use of insulin  (HCC) -     Amb Referral to Nutrition and Diabetic Education -     Hemoglobin A1c -     glipiZIDE ER; Take 1 tablet (10 mg total) by mouth daily with breakfast. SCHEDULE FOLLOW UP  Dispense: 30 tablet; Refill: 0  Myalgia due to statin  Hyperlipidemia associated with type 2 diabetes mellitus (HCC) Assessment & Plan: Ordered lipid panel, pending results. Work on low cholesterol diet and exercise as tolerated long d/w pt on need to work on controlling this, and also it may have genetic inheritance. Ordered LPA as well to evaluate risk. Long d/w pt on calcifications, aortic stenosis etc. We can consider repatha since statin intolerant    Orders: -     Amb Referral to Nutrition and Diabetic Education -     Lipoprotein A (LPA) -     Lipid panel  Non compliance w medication regimen Assessment & Plan: Long d/w pt on non compliance, to f/u and call when unable to get medication so we can get diabetes to goal and not have uncontrolled.   Serum calcium elevated  Vitamin D deficiency -     VITAMIN D 25 Hydroxy (Vit-D Deficiency, Fractures)  Pedal edema Assessment & Plan: Monitor, advised to increase HCTZ for three days to 50 mg once daily, pt verbalized understanding.    Essential hypertension Assessment & Plan: Stable.   Orders: -     Valsartan; Take 1 tablet (80 mg total) by mouth daily.  Dispense: 30 tablet; Refill: 0 -     hydroCHLOROthiazide; Take 1 tablet (25 mg total) by mouth daily.  Dispense: 30 tablet; Refill: 0     Return in about 3 months (around 08/07/2023) for f/u diabetes, f/u cholesterol.  Mort Sawyers, MSN, APRN, FNP-C [facility]

## 2023-05-07 NOTE — Assessment & Plan Note (Signed)
Monitor, advised to increase HCTZ for three days to 50 mg once daily, pt verbalized understanding.

## 2023-05-08 ENCOUNTER — Other Ambulatory Visit: Payer: Self-pay | Admitting: Family

## 2023-05-08 DIAGNOSIS — E1169 Type 2 diabetes mellitus with other specified complication: Secondary | ICD-10-CM

## 2023-05-08 DIAGNOSIS — Z789 Other specified health status: Secondary | ICD-10-CM

## 2023-05-08 DIAGNOSIS — E119 Type 2 diabetes mellitus without complications: Secondary | ICD-10-CM

## 2023-05-08 DIAGNOSIS — T466X5A Adverse effect of antihyperlipidemic and antiarteriosclerotic drugs, initial encounter: Secondary | ICD-10-CM

## 2023-05-08 MED ORDER — TIRZEPATIDE 5 MG/0.5ML ~~LOC~~ SOAJ
5.0000 mg | SUBCUTANEOUS | 0 refills | Status: DC
Start: 2023-06-08 — End: 2023-07-04

## 2023-05-08 MED ORDER — TIRZEPATIDE 2.5 MG/0.5ML ~~LOC~~ SOAJ
2.5000 mg | SUBCUTANEOUS | 0 refills | Status: DC
Start: 2023-05-08 — End: 2023-07-04

## 2023-05-08 MED ORDER — REPATHA SURECLICK 140 MG/ML ~~LOC~~ SOAJ
140.0000 mg | SUBCUTANEOUS | 2 refills | Status: AC
Start: 2023-05-08 — End: ?

## 2023-05-08 NOTE — Progress Notes (Signed)
Cholesterol still high, with no real improvement (mild) from six months ago. I highly suggest we try to get approval for the anti cholesterol medication we discussed  during out visit. LDL is 153, goal is < 70. Total cholesterol goal <200 is 265. Is she willing?   A1c is worse than prior six months ago. Start glipizide but also will start injections we discussed. Sending in [place] to get started for once weekly injection.  I would like pt to check glucose every am fasting and record for next visit. Have her follow up in six weeks vs three months.

## 2023-05-10 ENCOUNTER — Telehealth: Payer: Self-pay | Admitting: Family

## 2023-05-10 NOTE — Telephone Encounter (Signed)
Received following message from mail order.

## 2023-05-13 ENCOUNTER — Other Ambulatory Visit: Payer: Self-pay | Admitting: Family

## 2023-05-13 DIAGNOSIS — E119 Type 2 diabetes mellitus without complications: Secondary | ICD-10-CM

## 2023-05-13 MED ORDER — METFORMIN HCL 500 MG PO TABS
500.0000 mg | ORAL_TABLET | Freq: Two times a day (BID) | ORAL | 3 refills | Status: DC
Start: 2023-05-13 — End: 2023-05-13

## 2023-05-13 NOTE — Telephone Encounter (Signed)
Did pt pick up glipizide from the pharmacy? If no do not start.  Lets change to metformin , one tablet once daily x one week then twice daily after that as long as tolerating well. We will change since we are able to start the injection of mounjaro. Ask pt if she was able to pick that up and get started.   Please notify pharmacy as well as have changed therapy.  If she has started glipizide have her continue, NOT start metformin, and continue with injection.

## 2023-05-13 NOTE — Addendum Note (Signed)
Addended by: Mort Sawyers on: 05/13/2023 02:32 PM   Modules accepted: Orders

## 2023-05-15 ENCOUNTER — Telehealth: Payer: Self-pay

## 2023-05-15 NOTE — Telephone Encounter (Signed)
Any recommendations on maybe how we can get this approved?  Pt statin intolerant

## 2023-05-15 NOTE — Telephone Encounter (Signed)
Pharmacy Patient Advocate Encounter  Received notification from [facility] that Prior Authorization for Repatha 140mg /ml has been DENIED. Please advise how you'd like to proceed. Full denial letter will be uploaded to the media tab. See denial reason below.   PA #/Case ID/Reference #: [id number]

## 2023-05-15 NOTE — Telephone Encounter (Signed)
Hi Dorene Grebe - is there any way you can see what was actually submitted for the PA? The denial only says that [employer] never received a diagnosis or history of prior medications, but that should have been submitted on the PA itself.   Thanks!

## 2023-05-16 NOTE — Telephone Encounter (Signed)
[facility] pharmacy called in requesting a call back for more info with prior auth to approve  pt Rx Evolocumab (REPATHA SURECLICK) 140 MG/ML SOAJ  # [phone] [id number]

## 2023-05-20 NOTE — Telephone Encounter (Signed)
Susan [name] from [facility] contacted the office regarding PA for this medication, is asking if this can be re sent. PA status was showing that more information was needed, sent to provider pool and PA team

## 2023-05-20 NOTE — Telephone Encounter (Signed)
As far as I can tell, our team did not initiate/submit initial PA, I am resubmitting a new PA.  Pharmacy Patient Advocate Encounter   Received notification from Pt Calls Messages that prior authorization for Repatha SureClick 140MG /ML auto-injectors is required/requested.   Insurance verification completed.   The patient is insured through [facility] .   Per test claim: PA required; PA submitted to [facility] via CoverMyMeds Key/confirmation #/EOC [id number] Status is pending

## 2023-06-06 ENCOUNTER — Other Ambulatory Visit: Payer: Self-pay | Admitting: Family

## 2023-06-06 DIAGNOSIS — I10 Essential (primary) hypertension: Secondary | ICD-10-CM

## 2023-06-27 ENCOUNTER — Telehealth: Payer: 59 | Admitting: Physician Assistant

## 2023-06-27 DIAGNOSIS — K5900 Constipation, unspecified: Secondary | ICD-10-CM | POA: Diagnosis not present

## 2023-06-27 MED ORDER — POLYETHYLENE GLYCOL 3350 17 G PO PACK: 17.0000 g | PACK | Freq: Every day | ORAL | 0 refills | Status: DC

## 2023-06-27 NOTE — Progress Notes (Signed)
E-Visit for Diarrhea  We are sorry that you are not feeling well.  Here is how we plan to help!  Based on what you have shared with me it looks like you have Constipation with overflow diarrhea.  I would recommend to start Miralax 1 capful daily until having at least 1 full, soft bowel movement.  If not improving in 5-7 days, you will need to follow up with your Primary Care Provider/Office for this issue for further evaluation. You are on some medications that can increase constipation issues.   HOME CARE We recommend changing your diet to help with your symptoms for the next few days. Drink plenty of fluids that contain water salt and sugar. Sports drinks such as Gatorade may help.  You may try broths, soups, bananas, applesauce, soft breads, mashed potatoes or crackers.  You are considered infectious for as long as the diarrhea continues. Hand washing or use of alcohol based hand sanitizers is recommend. It is best to stay out of work or school until your symptoms stop.   GET HELP RIGHT AWAY If you have dark yellow colored urine or do not pass urine frequently you should drink more fluids.   If your symptoms worsen  If you feel like you are going to pass out (faint) You have a new problem  MAKE SURE YOU  Understand these instructions. Will watch your condition. Will get help right away if you are not doing well or get worse.  Thank you for choosing an e-visit.  Your e-visit answers were reviewed by a board certified advanced clinical practitioner to complete your personal care plan. Depending upon the condition, your plan could have included both over the counter or prescription medications.  Please review your pharmacy choice. Make sure the pharmacy is open so you can pick up prescription now. If there is a problem, you may contact your provider through [employer] and have the prescription routed to another pharmacy.  Your safety is important to [place]. If you have drug  allergies check your prescription carefully.   For the next 24 hours you can use MyChart to ask questions about today's visit, request a non-urgent call back, or ask for a work or school excuse. You will get an email in the next two days asking about your experience. I hope that your e-visit has been valuable and will speed your recovery.  I have spent 5 minutes in review of e-visit questionnaire, review and updating patient chart, medical decision making and response to patient.   Margaretann Loveless, PA-C

## 2023-07-02 ENCOUNTER — Encounter: Payer: Self-pay | Admitting: *Deleted

## 2023-07-02 DIAGNOSIS — E119 Type 2 diabetes mellitus without complications: Secondary | ICD-10-CM

## 2023-07-04 MED ORDER — TIRZEPATIDE 5 MG/0.5ML ~~LOC~~ SOAJ
5.0000 mg | SUBCUTANEOUS | 10 refills | Status: DC
Start: 2023-07-04 — End: 2024-02-12

## 2023-08-28 ENCOUNTER — Other Ambulatory Visit: Payer: Self-pay | Admitting: Family

## 2023-08-28 DIAGNOSIS — Z1231 Encounter for screening mammogram for malignant neoplasm of breast: Secondary | ICD-10-CM

## 2023-09-10 ENCOUNTER — Ambulatory Visit
Admission: RE | Admit: 2023-09-10 | Discharge: 2023-09-10 | Disposition: A | Discharge status: Home / Self Care | Payer: 59 | Source: Ambulatory Visit

## 2023-09-10 DIAGNOSIS — Z1231 Encounter for screening mammogram for malignant neoplasm of breast: Secondary | ICD-10-CM

## 2023-09-11 ENCOUNTER — Encounter: Payer: Self-pay | Admitting: Family

## 2023-09-11 NOTE — Telephone Encounter (Signed)
 Care team updated and letter sent for eye exam notes.

## 2023-09-16 ENCOUNTER — Other Ambulatory Visit: Payer: Self-pay | Admitting: Family

## 2023-09-16 DIAGNOSIS — R928 Other abnormal and inconclusive findings on diagnostic imaging of breast: Secondary | ICD-10-CM

## 2023-10-01 ENCOUNTER — Other Ambulatory Visit: Payer: Self-pay | Admitting: Family

## 2023-10-01 ENCOUNTER — Ambulatory Visit
Admission: RE | Admit: 2023-10-01 | Discharge: 2023-10-01 | Discharge status: Home / Self Care | Payer: 59 | Source: Ambulatory Visit | Attending: Family

## 2023-10-01 DIAGNOSIS — R921 Mammographic calcification found on diagnostic imaging of breast: Secondary | ICD-10-CM

## 2023-10-01 DIAGNOSIS — R928 Other abnormal and inconclusive findings on diagnostic imaging of breast: Secondary | ICD-10-CM

## 2023-10-01 NOTE — Progress Notes (Signed)
 noted

## 2023-10-02 ENCOUNTER — Ambulatory Visit
Admission: RE | Admit: 2023-10-02 | Discharge: 2023-10-02 | Disposition: A | Discharge status: Home / Self Care | Payer: 59 | Source: Ambulatory Visit | Attending: Family | Admitting: Family

## 2023-10-02 DIAGNOSIS — R921 Mammographic calcification found on diagnostic imaging of breast: Secondary | ICD-10-CM

## 2023-10-02 HISTORY — PX: BREAST BIOPSY: SHX20

## 2023-10-02 NOTE — Progress Notes (Signed)
 noted

## 2023-10-03 LAB — SURGICAL PATHOLOGY

## 2023-10-03 NOTE — Progress Notes (Signed)
 noted

## 2023-10-15 ENCOUNTER — Other Ambulatory Visit: Payer: Self-pay | Admitting: Nurse Practitioner

## 2023-10-15 DIAGNOSIS — K743 Primary biliary cirrhosis: Secondary | ICD-10-CM

## 2023-10-15 DIAGNOSIS — K7469 Other cirrhosis of liver: Secondary | ICD-10-CM

## 2023-10-22 ENCOUNTER — Other Ambulatory Visit: Payer: Self-pay | Admitting: Gastroenterology

## 2023-10-29 NOTE — Telephone Encounter (Signed)
-----   Message from [facility] sent at 05/07/2023  9:34 AM EDT ----- Regarding: FW: U/S due in August  ----- Message ----- From: Leigh Elspeth SQUIBB, MD Sent: 05/06/2023   7:26 AM EDT To: Clarita CHRISTELLA Favre, CMA Subject: RE: U/S due in August                          Okay thanks. We can change recall to 10/2023 for RUQ US  for [facility] screening. Thanks ----- Message ----- From: Favre Clarita CHRISTELLA, CMA Sent: 05/05/2023  12:00 AM EDT To: Elspeth SQUIBB Leigh, MD Subject: FW: U/S due in August                          See RUQ done on 04-25-23 Dawn Drazek ----- Message ----- From: Favre Clarita CHRISTELLA, CMA Sent: 04/29/2023  12:00 AM EDT To: Clarita CHRISTELLA Favre, CMA Subject: U/S due in August                              Due for RUQ U/S in August for cirrhosis (around the 20th)

## 2023-10-29 NOTE — Telephone Encounter (Signed)
Patient due for RUQ U/S.  MyChart message to patient to see if she wants to be scheduled with [facility] or [facility] as Annamarie Major has ordered an RUQ ultrasound as well.

## 2023-10-31 NOTE — Telephone Encounter (Signed)
 Message to schedulers to call patient to schedule RUQ U/S.  She may elect to have done by [facility]'s office instead

## 2023-11-01 ENCOUNTER — Telehealth: Payer: 59 | Admitting: Family Medicine

## 2023-11-01 DIAGNOSIS — J329 Chronic sinusitis, unspecified: Secondary | ICD-10-CM | POA: Diagnosis not present

## 2023-11-01 DIAGNOSIS — B9789 Other viral agents as the cause of diseases classified elsewhere: Secondary | ICD-10-CM

## 2023-11-01 MED ORDER — FLUTICASONE PROPIONATE 50 MCG/ACT NA SUSP: 2.0000 | Freq: Every day | NASAL | 0 refills | Status: DC

## 2023-11-01 MED ORDER — FLUTICASONE PROPIONATE 50 MCG/ACT NA SUSP: 2.0000 | Freq: Every day | NASAL | 6 refills | Status: DC

## 2023-11-01 NOTE — Progress Notes (Signed)
 E-Visit for Sinus Problems  We are sorry that you are not feeling well.  Here is how we plan to help!  Based on what you have shared with me it looks like you have sinusitis.  Sinusitis is inflammation and infection in the sinus cavities of the head.  Based on your presentation I believe you most likely have Acute Viral Sinusitis.This is an infection most likely caused by a virus. There is not specific treatment for viral sinusitis other than to help you with the symptoms until the infection runs its course.  You may use an oral decongestant such as Mucinex D or if you have glaucoma or high blood pressure use plain Mucinex. Saline nasal spray help and can safely be used as often as needed for congestion, I have prescribed: Fluticasone nasal spray two sprays in each nostril once a day  Some authorities believe that zinc sprays or the use of Echinacea may shorten the course of your symptoms.  Sinus infections are not as easily transmitted as other respiratory infection, however we still recommend that you avoid close contact with loved ones, especially the very young and elderly.  Remember to wash your hands thoroughly throughout the day as this is the number one way to prevent the spread of infection!  Home Care: Only take medications as instructed by your medical team. Do not take these medications with alcohol. A steam or ultrasonic humidifier can help congestion.  You can place a towel over your head and breathe in the steam from hot water coming from a faucet. Avoid close contacts especially the very young and the elderly. Cover your mouth when you cough or sneeze. Always remember to wash your hands.  Get Help Right Away If: You develop worsening fever or sinus pain. You develop a severe head ache or visual changes. Your symptoms persist after you have completed your treatment plan.  Make sure you Understand these instructions. Will watch your condition. Will get help right away if you  are not doing well or get worse.   Thank you for choosing an e-visit.  Your e-visit answers were reviewed by a board certified advanced clinical practitioner to complete your personal care plan. Depending upon the condition, your plan could have included both over the counter or prescription medications.  Please review your pharmacy choice. Make sure the pharmacy is open so you can pick up prescription now. If there is a problem, you may contact your provider through [employer] and have the prescription routed to another pharmacy.  Your safety is important to [place]. If you have drug allergies check your prescription carefully.   For the next 24 hours you can use MyChart to ask questions about today's visit, request a non-urgent call back, or ask for a work or school excuse. You will get an email in the next two days asking about your experience. I hope that your e-visit has been valuable and will speed your recovery.

## 2023-11-11 NOTE — Progress Notes (Signed)
 I have provided 5 minutes of non face to face time during this encounter for chart review and documentation.

## 2023-11-26 ENCOUNTER — Ambulatory Visit (HOSPITAL_COMMUNITY)
Admission: RE | Admit: 2023-11-26 | Discharge: 2023-11-26 | Disposition: A | Discharge status: Home / Self Care | Payer: 59 | Source: Ambulatory Visit | Attending: Gastroenterology | Admitting: Gastroenterology

## 2023-11-26 DIAGNOSIS — K743 Primary biliary cirrhosis: Secondary | ICD-10-CM | POA: Diagnosis present

## 2023-11-26 DIAGNOSIS — I864 Gastric varices: Secondary | ICD-10-CM | POA: Insufficient documentation

## 2023-11-26 DIAGNOSIS — K7460 Unspecified cirrhosis of liver: Secondary | ICD-10-CM | POA: Insufficient documentation

## 2023-12-04 ENCOUNTER — Encounter: Payer: Self-pay | Admitting: Gastroenterology

## 2023-12-09 ENCOUNTER — Other Ambulatory Visit: Payer: Self-pay | Admitting: Family

## 2023-12-09 ENCOUNTER — Encounter: Payer: Self-pay | Admitting: Family

## 2023-12-09 DIAGNOSIS — I10 Essential (primary) hypertension: Secondary | ICD-10-CM

## 2023-12-23 LAB — HM DIABETES EYE EXAM

## 2024-01-07 ENCOUNTER — Other Ambulatory Visit: Payer: Self-pay | Admitting: Family

## 2024-01-07 DIAGNOSIS — I10 Essential (primary) hypertension: Secondary | ICD-10-CM

## 2024-02-11 ENCOUNTER — Encounter: Payer: Self-pay | Admitting: Family

## 2024-02-11 DIAGNOSIS — E1169 Type 2 diabetes mellitus with other specified complication: Secondary | ICD-10-CM

## 2024-02-12 MED ORDER — TIRZEPATIDE 7.5 MG/0.5ML ~~LOC~~ SOAJ: 7.5000 mg | SUBCUTANEOUS | 0 refills | Status: DC

## 2024-02-26 MED ORDER — TIRZEPATIDE 7.5 MG/0.5ML ~~LOC~~ SOAJ: 7.5000 mg | SUBCUTANEOUS | 0 refills | Status: DC

## 2024-02-26 NOTE — Addendum Note (Signed)
 Addended by: Felicita Horns on: 02/26/2024 03:06 PM   Modules accepted: Orders

## 2024-04-14 ENCOUNTER — Other Ambulatory Visit (HOSPITAL_COMMUNITY): Payer: Self-pay | Admitting: Nurse Practitioner

## 2024-04-14 DIAGNOSIS — K7469 Other cirrhosis of liver: Secondary | ICD-10-CM

## 2024-04-14 DIAGNOSIS — K743 Primary biliary cirrhosis: Secondary | ICD-10-CM

## 2024-04-27 ENCOUNTER — Telehealth: Payer: Self-pay

## 2024-04-27 NOTE — Telephone Encounter (Signed)
 Patient is scheduled fur ultrasound on 8-8 for [facility]

## 2024-04-27 NOTE — Telephone Encounter (Signed)
-----   Message from [facility] sent at 04/24/2024  2:03 PM EDT -----  ----- Message ----- From: Madan, Amanda L, CMA Sent: 04/24/2024  12:00 AM EDT To: Clarita CHRISTELLA Favre, CMA  Schedule RUQ ultrasound for 6 month f/u for [facility] screening/cirrhosis.

## 2024-05-01 ENCOUNTER — Ambulatory Visit (HOSPITAL_COMMUNITY)
Admission: RE | Admit: 2024-05-01 | Discharge: 2024-05-01 | Disposition: A | Discharge status: Home / Self Care | Source: Ambulatory Visit | Attending: Nurse Practitioner | Admitting: Nurse Practitioner

## 2024-05-01 DIAGNOSIS — K743 Primary biliary cirrhosis: Secondary | ICD-10-CM | POA: Diagnosis present

## 2024-05-01 DIAGNOSIS — K7469 Other cirrhosis of liver: Secondary | ICD-10-CM | POA: Diagnosis present

## 2024-06-04 ENCOUNTER — Ambulatory Visit: Admitting: Family

## 2024-06-10 ENCOUNTER — Ambulatory Visit: Admitting: Family

## 2024-06-10 ENCOUNTER — Telehealth: Payer: Self-pay

## 2024-06-10 ENCOUNTER — Other Ambulatory Visit: Payer: Self-pay | Admitting: Family

## 2024-06-10 ENCOUNTER — Telehealth: Payer: Self-pay | Admitting: Family

## 2024-06-10 ENCOUNTER — Other Ambulatory Visit (HOSPITAL_COMMUNITY): Payer: Self-pay

## 2024-06-10 ENCOUNTER — Encounter: Payer: Self-pay | Admitting: Family

## 2024-06-10 VITALS — BP 134/80 | HR 84 | Temp 98.4°F | Ht 59.75 in | Wt 198.2 lb

## 2024-06-10 DIAGNOSIS — E669 Obesity, unspecified: Secondary | ICD-10-CM

## 2024-06-10 DIAGNOSIS — Z6839 Body mass index (BMI) 39.0-39.9, adult: Secondary | ICD-10-CM

## 2024-06-10 DIAGNOSIS — H6123 Impacted cerumen, bilateral: Secondary | ICD-10-CM | POA: Diagnosis not present

## 2024-06-10 DIAGNOSIS — K8020 Calculus of gallbladder without cholecystitis without obstruction: Secondary | ICD-10-CM

## 2024-06-10 DIAGNOSIS — Z23 Encounter for immunization: Secondary | ICD-10-CM | POA: Diagnosis not present

## 2024-06-10 DIAGNOSIS — Z79899 Other long term (current) drug therapy: Secondary | ICD-10-CM

## 2024-06-10 DIAGNOSIS — Z7985 Long-term (current) use of injectable non-insulin antidiabetic drugs: Secondary | ICD-10-CM | POA: Diagnosis not present

## 2024-06-10 DIAGNOSIS — E1169 Type 2 diabetes mellitus with other specified complication: Secondary | ICD-10-CM | POA: Diagnosis not present

## 2024-06-10 DIAGNOSIS — I10 Essential (primary) hypertension: Secondary | ICD-10-CM | POA: Diagnosis not present

## 2024-06-10 DIAGNOSIS — R600 Localized edema: Secondary | ICD-10-CM

## 2024-06-10 DIAGNOSIS — E785 Hyperlipidemia, unspecified: Secondary | ICD-10-CM

## 2024-06-10 DIAGNOSIS — R011 Cardiac murmur, unspecified: Secondary | ICD-10-CM

## 2024-06-10 LAB — HEMOGLOBIN A1C: Hgb A1c MFr Bld: 5.4 % (ref 4.6–6.5)

## 2024-06-10 LAB — LIPID PANEL
Cholesterol: 227 mg/dL — ABNORMAL HIGH (ref 0–200)
HDL: 103.1 mg/dL (ref >39.00)
LDL Cholesterol: 111 mg/dL — ABNORMAL HIGH (ref 0–99)
NonHDL: 123.75
Total CHOL/HDL Ratio: 2
Triglycerides: 63 mg/dL (ref 0.0–149.0)
VLDL: 12.6 mg/dL (ref 0.0–40.0)

## 2024-06-10 LAB — MICROALBUMIN / CREATININE URINE RATIO
Creatinine,U: 140.6 mg/dL
Microalb Creat Ratio: UNDETERMINED mg/g (ref 0.0–30.0)
Microalb, Ur: <0.7 mg/dL

## 2024-06-10 MED ORDER — SPIRONOLACTONE 50 MG PO TABS: 50.0000 mg | ORAL_TABLET | Freq: Every day | ORAL | 0 refills | Status: DC

## 2024-06-10 MED ORDER — TIRZEPATIDE 7.5 MG/0.5ML ~~LOC~~ SOAJ: 7.5000 mg | SUBCUTANEOUS | 1 refills | Status: DC

## 2024-06-10 NOTE — Telephone Encounter (Signed)
 Pharmacy Patient Advocate Encounter  Received notification from [employer] that Prior Authorization for Mounjaro  7.5 has been APPROVED from 06/10/24 to 06/10/25. Unable to obtain price due to refill too soon rejection, last fill date 06/10/24 next available fill date10/8/25   PA #/Case ID/Reference #: # [id number]

## 2024-06-10 NOTE — Progress Notes (Addendum)
 Established Patient Office Visit  Subjective:      CC:  Chief Complaint  Patient presents with   Medical Management of Chronic Issues    HPI: Susan [name] is a 58 y.o. female presenting on 06/10/2024 for Medical Management of Chronic Issues .  Discussed the use of AI scribe software for clinical note transcription with the patient, who gave verbal consent to proceed.  History of Present Illness Susan [name] is a 58 year old female with cirrhosis and diabetes who presents for follow-up regarding medication management and swelling in her legs.  She has a history of cirrhosis with portal hypertension and gastric varices. An ultrasound in August showed stable cirrhotic morphology, no focal hepatic mass, and a contracted gallbladder with a single gallstone. She continues to take ursodiol  600 mg twice daily.  For diabetes, she is on Mounjaro , which aids in weight and diabetes control. She is currently on a 5 mg dose due to prescription issues with the 7.5 mg dose. She notes no significant weight loss but has a decreased appetite. Initially, she experienced nausea, which she attributes to a medication change from nadolol  to carvedilol .  She reports new onset leg swelling, occurring even without prolonged standing. The swelling is more pronounced by the end of the day and persists overnight. It improves with walking and worsens with prolonged sitting. No calf pain, but she describes a tight feeling and visible indentations from swelling. She avoids salt and primarily eats fish. She is taking hydrochlorothiazide  25 mg.  Her past medical history includes a heart murmur since childhood, and she recalls having an echocardiogram in the past. No chest pain or shortness of breath. No neuropathy symptoms such as tingling or numbness in her feet.   Wt Readings from Last 3 Encounters:  06/10/24 198 lb 3.2 oz (89.9 kg)  05/07/23 189 lb 3.2 oz (85.8 kg)  01/22/23 187 lb (84.8 kg)          Social history:  Relevant past medical, surgical, family and social history reviewed and updated as indicated. Interim medical history since our last visit reviewed.  Allergies and medications reviewed and updated.  DATA REVIEWED: CHART IN EPIC     ROS: Negative unless specifically indicated above in HPI.    Current Outpatient Medications:    carvedilol  (COREG ) 3.125 MG tablet, Take 3.125 mg by mouth 2 (two) times daily., Disp: , Rfl:    fluticasone  (FLONASE ) 50 MCG/ACT nasal spray, Place 2 sprays into both nostrils daily., Disp: 16 g, Rfl: 6   fluticasone  (FLONASE ) 50 MCG/ACT nasal spray, Place 2 sprays into both nostrils daily., Disp: 9.9 mL, Rfl: 0   hydrochlorothiazide  (HYDRODIURIL ) 25 MG tablet, TAKE 1 TABLET BY MOUTH DAILY, Disp: 90 tablet, Rfl: 3   Multiple Vitamins-Minerals (WOMENS MULTIVITAMIN PO), Take 2 tablets by mouth daily., Disp: , Rfl:    tirzepatide  (MOUNJARO ) 7.5 MG/0.5ML Pen, Inject 7.5 mg into the skin once a week., Disp: 6 mL, Rfl: 1   ursodiol  (ACTIGALL ) 300 MG capsule, TAKE 2 CAPSULES BY MOUTH TWICE  DAILY, Disp: 360 capsule, Rfl: 3   valsartan  (DIOVAN ) 80 MG tablet, TAKE 1 TABLET BY MOUTH DAILY, Disp: 90 tablet, Rfl: 3   spironolactone  (ALDACTONE ) 50 MG tablet, Take 1 tablet (50 mg total) by mouth daily., Disp: 90 tablet, Rfl: 0        Objective:        BP 134/80 (BP Location: Left Arm, Patient Position: Sitting, Cuff Size: Large)   Pulse 84  Temp 98.4 F (36.9 C) (Temporal)   Ht 4' 11.75 (1.518 m)   Wt 198 lb 3.2 oz (89.9 kg)   SpO2 98%   BMI 39.03 kg/m   Physical Exam CARDIOVASCULAR: Heart murmur present. NEUROLOGICAL: Sensation intact in feet.  Wt Readings from Last 3 Encounters:  06/10/24 198 lb 3.2 oz (89.9 kg)  05/07/23 189 lb 3.2 oz (85.8 kg)  01/22/23 187 lb (84.8 kg)    Physical Exam Vitals reviewed.  Constitutional:      General: She is not in acute distress.    Appearance: Normal appearance. She is normal  weight. She is not ill-appearing, toxic-appearing or diaphoretic.  HENT:     Head: Normocephalic.     Right Ear: There is impacted cerumen.     Left Ear: There is impacted cerumen.  Cardiovascular:     Rate and Rhythm: Normal rate.     Pulses:          Dorsalis pedis pulses are 2+ on the right side and 2+ on the left side.     Heart sounds: Murmur heard.     Systolic murmur is present.     Comments: Posterior tibial limited by swelling for pulses  Pulmonary:     Effort: Pulmonary effort is normal.  Musculoskeletal:        General: Normal range of motion.     Right lower leg: 2+ Pitting Edema present.     Left lower leg: 2+ Pitting Edema present.  Neurological:     General: No focal deficit present.     Mental Status: She is alert and oriented to person, place, and time. Mental status is at baseline.  Psychiatric:        Mood and Affect: Mood normal.        Behavior: Behavior normal.        Thought Content: Thought content normal.        Judgment: Judgment normal.    Title   Diabetic Foot Exam - detailed Is there a history of foot ulcer?: No Is there a foot ulcer now?: No Is there swelling?: No Is there elevated skin temperature?: No Is there abnormal foot shape?: No Is there a claw toe deformity?: No Are the toenails long?: No Are the toenails thick?: No Are the toenails ingrown?: No Is the skin thin, fragile, shiny and hairless?: No Normal Range of Motion?: Yes Is there foot or ankle muscle weakness?: No Do you have pain in calf while walking?: No Are the shoes appropriate in style and fit?: Yes Can the patient see the bottom of their feet?: Yes Pulse Foot Exam completed.: Yes   Right Posterior Tibialis: Present Left posterior Tibialis: Present   Right Dorsalis Pedis: Present Left Dorsalis Pedis: Present     Semmes-Weinstein Monofilament Test + means has sensation and - means no sensation  R Foot Test Control: Neg L Foot Test Control: Neg   R Site  1-Great Toe: Neg L Site 1-Great Toe: Neg   R Site 4: Neg L Site 4: Neg   R site 5: Neg L Site 5: Neg  R Site 6: Neg L Site 6: Neg     Image components are not supported.   Image components are not supported. Image components are not supported.  [place] Comments          Results Right upper quadrant ultrasound: Stable cirrhotic morphology, no focal hepatic mass, contracted gallbladder with one gallstone (05/01/2024)  Assessment & Plan:   Assessment  and Plan Assessment & Plan Cirrhosis with portal hypertension and gastric varices Cirrhosis is well-managed with preserved liver synthetic function. No ascites or hepatic encephalopathy. Hepatoma screening is due. -continue scheduled f/u with hepatologist as scheduled  - Continue ursodiol  600 mg twice a day  Bilateral lower extremity edema Edema likely due to prolonged sitting and liver disease. Swelling more pronounced by end of day, improves with activity. No pain or circulatory issues. - Advise wearing compression stockings - Encourage elevation of feet when possible - Advise getting up and walking every hour -consideration of cirrhotic contribution  -reviewed labs, kidney function stable, no evidence of kidney disease   Type 2 diabetes mellitus Diabetes management is suboptimal with current Mounjaro  dose. Requires increase from 5 mg to 7.5 mg. No neuropathy or other complications. Previous issues with prescription refill due to prior authorization requirements. - Send prior authorization for Mounjaro  7.5 mg -discussed non compliance  - Monitor A1c levels, ordered today  - Encourage dietary management and regular follow-up  Heart murmur Longstanding heart murmur, stable. Previous echocardiogram was normal. No recent echocardiogram available. - Monitor for any new symptoms such as chest pain or shortness of breath  Bil impacted cerumen  Ceruminosis is noted.  Obtained verbal patient consent prior to procedure,  possible risks of procedure discussed with pt prior, and then Wax was removed by syringing/irrigation and manual debridement was performed by me with curette. Instructions for home care to prevent wax buildup are given and handout provided to pt .pt tolerated procedure well.  Recording duration: 15 minutes      Return in about 3 months (around 09/09/2024) for f/u diabetes.     Ginger Patrick, MSN, APRN, FNP-C [facility]

## 2024-06-10 NOTE — Telephone Encounter (Signed)
 Needs increased dose mounjaro  7.5 prior auth sent

## 2024-06-10 NOTE — Telephone Encounter (Signed)
 Pharmacy Patient Advocate Encounter   Received notification from Pt Calls Messages that prior authorization for Mounjaro  7.5 is required/requested.   Insurance verification completed.   The patient is insured through [facility] .   Per test claim: PA required; PA submitted to above mentioned insurance via [employer] Key/confirmation #/EOC [facility] Status is pending

## 2024-06-11 ENCOUNTER — Ambulatory Visit: Payer: Self-pay | Admitting: Family

## 2024-06-29 ENCOUNTER — Ambulatory Visit (HOSPITAL_COMMUNITY)
Admission: RE | Admit: 2024-06-29 | Discharge: 2024-06-29 | Disposition: A | Discharge status: Home / Self Care | Source: Ambulatory Visit | Attending: Surgery | Admitting: Surgery

## 2024-06-29 DIAGNOSIS — R600 Localized edema: Secondary | ICD-10-CM | POA: Insufficient documentation

## 2024-06-29 DIAGNOSIS — R011 Cardiac murmur, unspecified: Secondary | ICD-10-CM | POA: Insufficient documentation

## 2024-06-29 LAB — ECHOCARDIOGRAM COMPLETE
AR max vel: 1 cm2
AV Area VTI: 1.03 cm2
AV Area mean vel: 1 cm2
AV Mean grad: 7 mmHg
AV Peak grad: 13.4 mmHg
Ao pk vel: 1.83 m/s
Area-P 1/2: 2.87 cm2
MV M vel: 1.67 m/s
MV Peak grad: 11.2 mmHg
S' Lateral: 2.43 cm

## 2024-06-30 ENCOUNTER — Ambulatory Visit: Payer: Self-pay | Admitting: Family

## 2024-08-13 ENCOUNTER — Other Ambulatory Visit: Payer: Self-pay | Admitting: Family

## 2024-08-13 ENCOUNTER — Encounter: Payer: Self-pay | Admitting: Family

## 2024-08-13 DIAGNOSIS — Z1231 Encounter for screening mammogram for malignant neoplasm of breast: Secondary | ICD-10-CM

## 2024-08-13 NOTE — Telephone Encounter (Signed)
 Tell her this could potentially be due to the mounjaro  but there are also other possilbities so I would like to be able to see her in the office to evaluate this further to rule out other causes.

## 2024-08-17 ENCOUNTER — Ambulatory Visit: Admitting: Family

## 2024-08-17 VITALS — BP 126/80 | HR 72 | Temp 98.0°F | Ht 59.75 in | Wt 178.8 lb

## 2024-08-17 DIAGNOSIS — E119 Type 2 diabetes mellitus without complications: Secondary | ICD-10-CM | POA: Diagnosis not present

## 2024-08-17 DIAGNOSIS — K5903 Drug induced constipation: Secondary | ICD-10-CM | POA: Diagnosis not present

## 2024-08-17 NOTE — Progress Notes (Signed)
 Established Patient Office Visit  Subjective:      CC:  Chief Complaint  Patient presents with   Acute Visit    Having issues with constipation and then diarrhea. This has been going on for about 3 weeks.    HPI: Susan [name] is a 58 y.o. female presenting on 08/17/2024 for Acute Visit (Having issues with constipation and then diarrhea. This has been going on for about 3 weeks.) .  Discussed the use of AI scribe software for clinical note transcription with the patient, who gave verbal consent to proceed.  History of Present Illness Susan [name] is a 58 year old female with diabetes who presents with gastrointestinal symptoms including constipation and diarrhea.  She has been experiencing gastrointestinal symptoms for the past three weeks, characterized by a pattern of constipation followed by diarrhea. She takes her Mounjaro  injections on Friday mornings. Constipation typically begins by Monday or Tuesday, with diarrhea occurring by Wednesday or Thursday. The constipation involves soft, pasty stools that are difficult to pass, and she often feels an urge to defecate without success. Diarrhea has occurred twice during this period.  She attempted to manage constipation with Miralax , which resulted in uncontrollable diarrhea, leading her to avoid its use. The constipation is consuming her thoughts and affecting her ability to focus, as she is constantly aware of the need to defecate. She acknowledges not consuming enough fiber and is trying to improve her dietary habits.  She experiences nausea primarily on Sunday or Monday after her Friday Mounjaro  injection, but she does not feel nauseated at the time of the visit. She recalls a possible stomach virus about three and a half weeks ago, which included a fever and severe gastrointestinal symptoms, leaving her feeling dehydrated and without energy.  Her dietary intake is insufficient, often forgetting to eat, which she attributes  to the appetite-suppressing effects of Mounjaro . She has lost 20 pounds since September and is concerned about maintaining her weight loss while managing her symptoms. No upper abdominal pain, rectal pressure, increased gas, or vomiting.   Wt Readings from Last 3 Encounters:  08/17/24 178 lb 12.8 oz (81.1 kg)  06/10/24 198 lb 3.2 oz (89.9 kg)  05/07/23 189 lb 3.2 oz (85.8 kg)          Social history:  Relevant past medical, surgical, family and social history reviewed and updated as indicated. Interim medical history since our last visit reviewed.  Allergies and medications reviewed and updated.  DATA REVIEWED: CHART IN EPIC     ROS: Negative unless specifically indicated above in HPI.    Current Outpatient Medications:    carvedilol  (COREG ) 3.125 MG tablet, Take 3.125 mg by mouth 2 (two) times daily., Disp: , Rfl:    hydrochlorothiazide  (HYDRODIURIL ) 25 MG tablet, TAKE 1 TABLET BY MOUTH DAILY, Disp: 90 tablet, Rfl: 3   spironolactone  (ALDACTONE ) 50 MG tablet, Take 1 tablet (50 mg total) by mouth daily., Disp: 90 tablet, Rfl: 0   tirzepatide  (MOUNJARO ) 7.5 MG/0.5ML Pen, Inject 7.5 mg into the skin once a week., Disp: 6 mL, Rfl: 1   ursodiol  (ACTIGALL ) 300 MG capsule, TAKE 2 CAPSULES BY MOUTH TWICE  DAILY, Disp: 360 capsule, Rfl: 3        Objective:        BP 126/80 (BP Location: Right Arm, Patient Position: Sitting, Cuff Size: Normal)   Pulse 72   Temp 98 F (36.7 C) (Temporal)   Ht 4' 11.75 (1.518 m)   Wt 178  lb 12.8 oz (81.1 kg)   SpO2 98%   BMI 35.21 kg/m   Physical Exam ABDOMEN: Abdomen normal.  Wt Readings from Last 3 Encounters:  08/17/24 178 lb 12.8 oz (81.1 kg)  06/10/24 198 lb 3.2 oz (89.9 kg)  05/07/23 189 lb 3.2 oz (85.8 kg)    Physical Exam Vitals reviewed.  Constitutional:      General: She is not in acute distress.    Appearance: Normal appearance. She is normal weight. She is not ill-appearing, toxic-appearing or diaphoretic.  HENT:      Head: Normocephalic.  Cardiovascular:     Rate and Rhythm: Normal rate.  Pulmonary:     Effort: Pulmonary effort is normal.  Abdominal:     General: There is no distension.     Palpations: Abdomen is soft.     Tenderness: There is no abdominal tenderness. There is no guarding.  Musculoskeletal:        General: Normal range of motion.  Neurological:     General: No focal deficit present.     Mental Status: She is alert and oriented to person, place, and time. Mental status is at baseline.  Psychiatric:        Mood and Affect: Mood normal.        Behavior: Behavior normal.        Thought Content: Thought content normal.        Judgment: Judgment normal.          Results   Assessment & Plan:   Assessment and Plan Assessment & Plan Gastrointestinal side effects of Mounjaro  therapy (constipation, diarrhea, and nausea) Gastrointestinal side effects, including constipation, diarrhea, and nausea, have been present for approximately three weeks, coinciding with Mounjaro  therapy. Constipation occurs by Monday or Tuesday post-injection, with diarrhea following by Wednesday or Thursday. Nausea is noted by Monday. Symptoms may be exacerbated by inadequate fiber intake and reduced appetite due to Mounjaro . Recent viral gastroenteritis may have contributed to symptoms. No abdominal tenderness or signs of pancreatitis. - Increase dietary fiber intake with daily Benefiber or similar supplement. - Increase water intake to aid bowel movements. - Consider stool softener if stools become hard. - Eat small meals every 2-3 hours to maintain metabolism and prevent hypoglycemia. - Combine snacks with protein, fiber, and carbohydrates. - Monitor symptoms and will consider reducing Mounjaro  dose if no improvement.  Type 2 diabetes mellitus Recent weight loss of 20 pounds since September. Concerns about inadequate caloric intake and potential muscle loss due to reduced appetite from Mounjaro .  Emphasis on maintaining adequate protein intake to prevent muscle loss and support weight management. - Ensure adequate caloric intake with small, frequent meals. - Incorporate protein-rich snacks and meals to prevent muscle loss.  General Health Maintenance Discussion about nail care and potential for fungal infections due to water exposure during pedicures.        Return in about 3 months (around 11/17/2024) for f/u diabetes.     Ginger Patrick, MSN, APRN, FNP-C [facility]

## 2024-08-27 ENCOUNTER — Telehealth: Payer: Self-pay | Admitting: Gastroenterology

## 2024-08-27 NOTE — Telephone Encounter (Signed)
 Inbound call from patient stating she has been having nausea and fecal urgency for about 3 weeks now. Patient is requesting to speak with nurse further regarding recommendations to help. Please advise, thank you

## 2024-08-27 NOTE — Telephone Encounter (Signed)
 Patient calls with complaints of about 3 weeks intermittent nausea as well as alternating constipation and diarrhea, though predominantly constipation. She describes the urge to defecate often but has difficulty having a bowel movement. Denies any blood in stool or bpr. Denies any abdominal pain. Does have some slight rectal discomfort at times.   Patient says that she recently started Monjauro and had a dosage increase around the time her symptoms began. I have advised that GLP1 medications in general can certainly cause nausea as well as constipation as they work by slowing the GI tract down to suppress appetite and aid in weight loss/glycemic control.   Patient is advised that she should be drinking 64 oz water daily to help prevent constipation but she described water making her nauseated to think about at times. Suggested using flavor packets etc to make water more palatable for her. I suggested miralax  daily to help with the constipation but she states she did this previously and had terrible diarrhea. Advised she may try miralax  1/2 capful every other day initially to see if this is more manageable. Additionally, patient says she has already been on fiber and has increased it. Advised patient she should speak with the prescribing physician for her Monjauro to make them aware of her current symptoms on the medication so they can possibly adjust or change medications for her if found to be the source of her problem.  Patient is quite overdue for a routine follow up with Dr Leigh for her PBC and cirrhosis (last visit 01/22/23 with suggested 6 month follow up) so she has been scheduled for a visit with Elida Shawl, NP on 09/03/24.   Of note: patient has also been followed by [name], NP with [facility].

## 2024-08-28 ENCOUNTER — Emergency Department (HOSPITAL_COMMUNITY)

## 2024-08-28 ENCOUNTER — Observation Stay (HOSPITAL_COMMUNITY)
Admission: EM | Admit: 2024-08-28 | Discharge: 2024-08-29 | Disposition: A | Attending: Internal Medicine | Admitting: Internal Medicine

## 2024-08-28 ENCOUNTER — Other Ambulatory Visit: Payer: Self-pay

## 2024-08-28 DIAGNOSIS — R739 Hyperglycemia, unspecified: Secondary | ICD-10-CM | POA: Diagnosis present

## 2024-08-28 DIAGNOSIS — E119 Type 2 diabetes mellitus without complications: Secondary | ICD-10-CM

## 2024-08-28 DIAGNOSIS — K754 Autoimmune hepatitis: Secondary | ICD-10-CM

## 2024-08-28 DIAGNOSIS — K7682 Hepatic encephalopathy: Principal | ICD-10-CM | POA: Diagnosis present

## 2024-08-28 DIAGNOSIS — K743 Primary biliary cirrhosis: Secondary | ICD-10-CM

## 2024-08-28 DIAGNOSIS — I864 Gastric varices: Secondary | ICD-10-CM | POA: Diagnosis present

## 2024-08-28 DIAGNOSIS — Z79899 Other long term (current) drug therapy: Secondary | ICD-10-CM | POA: Insufficient documentation

## 2024-08-28 DIAGNOSIS — Z789 Other specified health status: Secondary | ICD-10-CM | POA: Diagnosis present

## 2024-08-28 DIAGNOSIS — E1165 Type 2 diabetes mellitus with hyperglycemia: Secondary | ICD-10-CM | POA: Insufficient documentation

## 2024-08-28 DIAGNOSIS — E785 Hyperlipidemia, unspecified: Secondary | ICD-10-CM | POA: Diagnosis present

## 2024-08-28 DIAGNOSIS — I10 Essential (primary) hypertension: Secondary | ICD-10-CM | POA: Diagnosis present

## 2024-08-28 DIAGNOSIS — D500 Iron deficiency anemia secondary to blood loss (chronic): Secondary | ICD-10-CM | POA: Diagnosis present

## 2024-08-28 LAB — I-STAT VENOUS BLOOD GAS, ED
Acid-Base Excess: 1 mmol/L (ref 0.0–2.0)
Bicarbonate: 24.7 mmol/L (ref 20.0–28.0)
Calcium, Ion: 1.21 mmol/L (ref 1.15–1.40)
HCT: 39 % (ref 36.0–46.0)
Hemoglobin: 13.3 g/dL (ref 12.0–15.0)
O2 Saturation: 78 %
Potassium: 3.6 mmol/L (ref 3.5–5.1)
Sodium: 140 mmol/L (ref 135–145)
TCO2: 26 mmol/L (ref 22–32)
pCO2, Ven: 35.6 mmHg — ABNORMAL LOW (ref 44–60)
pH, Ven: 7.45 — ABNORMAL HIGH (ref 7.25–7.43)
pO2, Ven: 41 mmHg (ref 32–45)

## 2024-08-28 LAB — COMPREHENSIVE METABOLIC PANEL WITH GFR
ALT: 22 U/L (ref 0–44)
AST: 37 U/L (ref 15–41)
Albumin: 3.1 g/dL — ABNORMAL LOW (ref 3.5–5.0)
Alkaline Phosphatase: 74 U/L (ref 38–126)
Anion gap: 9 (ref 5–15)
BUN: 14 mg/dL (ref 6–20)
CO2: 24 mmol/L (ref 22–32)
Calcium: 10 mg/dL (ref 8.9–10.3)
Chloride: 107 mmol/L (ref 98–111)
Creatinine, Ser: 1.18 mg/dL — ABNORMAL HIGH (ref 0.44–1.00)
GFR, Estimated: 54 mL/min — ABNORMAL LOW (ref >60)
Glucose, Bld: 111 mg/dL — ABNORMAL HIGH (ref 70–99)
Potassium: 3.6 mmol/L (ref 3.5–5.1)
Sodium: 140 mmol/L (ref 135–145)
Total Bilirubin: 1.4 mg/dL — ABNORMAL HIGH (ref 0.0–1.2)
Total Protein: 7.3 g/dL (ref 6.5–8.1)

## 2024-08-28 LAB — URINALYSIS, ROUTINE W REFLEX MICROSCOPIC
Bilirubin Urine: NEGATIVE
Glucose, UA: NEGATIVE mg/dL
Hgb urine dipstick: NEGATIVE
Ketones, ur: NEGATIVE mg/dL
Leukocytes,Ua: NEGATIVE
Nitrite: NEGATIVE
Protein, ur: NEGATIVE mg/dL
Specific Gravity, Urine: 1.01 (ref 1.005–1.030)
pH: 6 (ref 5.0–8.0)

## 2024-08-28 LAB — TSH: TSH: 1.988 u[IU]/mL (ref 0.350–4.500)

## 2024-08-28 LAB — AMMONIA: Ammonia: 75 umol/L — ABNORMAL HIGH (ref 9–35)

## 2024-08-28 LAB — CBC
HCT: 40.9 % (ref 36.0–46.0)
Hemoglobin: 13.9 g/dL (ref 12.0–15.0)
MCH: 30.5 pg (ref 26.0–34.0)
MCHC: 34 g/dL (ref 30.0–36.0)
MCV: 89.7 fL (ref 80.0–100.0)
Platelets: 152 K/uL (ref 150–400)
RBC: 4.56 MIL/uL (ref 3.87–5.11)
RDW: 13.2 % (ref 11.5–15.5)
WBC: 6.5 K/uL (ref 4.0–10.5)
nRBC: 0 % (ref 0.0–0.2)

## 2024-08-28 LAB — IRON AND TIBC
Iron: 128 ug/dL (ref 28–170)
Saturation Ratios: 47 % — ABNORMAL HIGH (ref 10.4–31.8)
TIBC: 273 ug/dL (ref 250–450)
UIBC: 145 ug/dL

## 2024-08-28 LAB — PHOSPHORUS: Phosphorus: 3.4 mg/dL (ref 2.5–4.6)

## 2024-08-28 LAB — PROTIME-INR
INR: 1.4 — ABNORMAL HIGH (ref 0.8–1.2)
Prothrombin Time: 17.4 s — ABNORMAL HIGH (ref 11.4–15.2)

## 2024-08-28 LAB — MAGNESIUM
Magnesium: 1.5 mg/dL — ABNORMAL LOW (ref 1.7–2.4)
Magnesium: 1.5 mg/dL — ABNORMAL LOW (ref 1.7–2.4)

## 2024-08-28 LAB — HIV ANTIBODY (ROUTINE TESTING W REFLEX): HIV Screen 4th Generation wRfx: NONREACTIVE

## 2024-08-28 LAB — VITAMIN B12: Vitamin B-12: 267 pg/mL (ref 180–914)

## 2024-08-28 MED ORDER — MAGNESIUM OXIDE -MG SUPPLEMENT 400 (240 MG) MG PO TABS
400.0000 mg | ORAL_TABLET | Freq: Every day | ORAL | Status: DC
  Administered 2024-08-28 – 2024-08-29 (×2): 400 mg via ORAL
  Filled 2024-08-28 (×2): qty 1

## 2024-08-28 MED ORDER — LACTULOSE 10 GM/15ML PO SOLN
30.0000 g | Freq: Two times a day (BID) | ORAL | Status: DC
  Administered 2024-08-28 – 2024-08-29 (×3): 30 g via ORAL
  Filled 2024-08-28 (×4): qty 45

## 2024-08-28 MED ORDER — SENNOSIDES-DOCUSATE SODIUM 8.6-50 MG PO TABS: 1.0000 | ORAL_TABLET | Freq: Every evening | ORAL | Status: DC | PRN

## 2024-08-28 MED ORDER — MAGNESIUM OXIDE -MG SUPPLEMENT 400 (240 MG) MG PO TABS
400.0000 mg | ORAL_TABLET | ORAL | Status: AC
  Administered 2024-08-28: 400 mg via ORAL
  Filled 2024-08-28: qty 1

## 2024-08-28 MED ORDER — SODIUM CHLORIDE 0.9 % IV SOLN: INTRAVENOUS | Status: DC

## 2024-08-28 MED ORDER — CARVEDILOL 3.125 MG PO TABS
3.1250 mg | ORAL_TABLET | Freq: Two times a day (BID) | ORAL | Status: DC
  Administered 2024-08-28 – 2024-08-29 (×2): 3.125 mg via ORAL
  Filled 2024-08-28 (×2): qty 1

## 2024-08-28 MED ORDER — HEPARIN SODIUM (PORCINE) 5000 UNIT/ML IJ SOLN
5000.0000 [IU] | Freq: Three times a day (TID) | INTRAMUSCULAR | Status: DC
  Administered 2024-08-28 – 2024-08-29 (×3): 5000 [IU] via SUBCUTANEOUS
  Filled 2024-08-28 (×3): qty 1

## 2024-08-28 MED ORDER — SODIUM CHLORIDE 0.9 % IV BOLUS
1000.0000 mL | Freq: Once | INTRAVENOUS | Status: AC
  Administered 2024-08-28: 1000 mL via INTRAVENOUS

## 2024-08-28 MED ORDER — TIRZEPATIDE 7.5 MG/0.5ML ~~LOC~~ SOAJ: 7.5000 mg | SUBCUTANEOUS | Status: DC

## 2024-08-28 MED ORDER — ONDANSETRON HCL 4 MG PO TABS: 4.0000 mg | ORAL_TABLET | Freq: Four times a day (QID) | ORAL | Status: DC | PRN

## 2024-08-28 MED ORDER — HYDRALAZINE HCL 20 MG/ML IJ SOLN: 10.0000 mg | INTRAMUSCULAR | Status: DC | PRN

## 2024-08-28 MED ORDER — OXYCODONE HCL 5 MG PO TABS: 5.0000 mg | ORAL_TABLET | ORAL | Status: DC | PRN

## 2024-08-28 MED ORDER — FLEET ENEMA RE ENEM: 1.0000 | ENEMA | Freq: Once | RECTAL | Status: DC | PRN

## 2024-08-28 MED ORDER — HYDROMORPHONE HCL 1 MG/ML IJ SOLN: 0.5000 mg | INTRAMUSCULAR | Status: DC | PRN

## 2024-08-28 MED ORDER — URSODIOL 300 MG PO CAPS
600.0000 mg | ORAL_CAPSULE | Freq: Two times a day (BID) | ORAL | Status: DC
  Administered 2024-08-29: 600 mg via ORAL
  Filled 2024-08-28 (×3): qty 2

## 2024-08-28 MED ORDER — BISACODYL 5 MG PO TBEC: 5.0000 mg | DELAYED_RELEASE_TABLET | Freq: Every day | ORAL | Status: DC | PRN

## 2024-08-28 MED ORDER — SODIUM CHLORIDE 0.9% FLUSH: 3.0000 mL | Freq: Two times a day (BID) | INTRAVENOUS | Status: DC

## 2024-08-28 MED ORDER — IPRATROPIUM BROMIDE 0.02 % IN SOLN: 0.5000 mg | Freq: Four times a day (QID) | RESPIRATORY_TRACT | Status: DC | PRN

## 2024-08-28 MED ORDER — TRAZODONE HCL 50 MG PO TABS: 25.0000 mg | ORAL_TABLET | Freq: Every evening | ORAL | Status: DC | PRN

## 2024-08-28 MED ORDER — SPIRONOLACTONE 25 MG PO TABS
50.0000 mg | ORAL_TABLET | Freq: Every day | ORAL | Status: DC
  Administered 2024-08-28 – 2024-08-29 (×2): 50 mg via ORAL
  Filled 2024-08-28 (×2): qty 2

## 2024-08-28 MED ORDER — ONDANSETRON HCL 4 MG/2ML IJ SOLN: 4.0000 mg | Freq: Four times a day (QID) | INTRAMUSCULAR | Status: DC | PRN

## 2024-08-28 MED ORDER — HYDROCHLOROTHIAZIDE 25 MG PO TABS
25.0000 mg | ORAL_TABLET | Freq: Every day | ORAL | Status: DC
  Administered 2024-08-29: 25 mg via ORAL
  Filled 2024-08-28: qty 1

## 2024-08-28 NOTE — Assessment & Plan Note (Signed)
 Per patient and family in record patient is not diabetic -Most recent A1c 5.4   - 2 months ago -On Mounjaro  -for weight loss

## 2024-08-28 NOTE — Assessment & Plan Note (Signed)
 Not on any iron supplements -: Stable, check an iron level

## 2024-08-28 NOTE — ED Notes (Signed)
PT to scan

## 2024-08-28 NOTE — TOC CM/SW Note (Signed)
 TOC consult received for d/c planning needs. Follow-up to be completed with patient as appropriate.   Merilee Batty, MSN, RN Case Management [phone]

## 2024-08-28 NOTE — Assessment & Plan Note (Deleted)
 Most recent A1c 5.4 -2 months ago Checking CBG q. ACHS, SSI coverage -Continue home medication of Mounjaro 

## 2024-08-28 NOTE — Assessment & Plan Note (Signed)
-   History of autoimmune liver cirrhosis - LFTs within normal limits   - Ammonia level 75, bilirubin 1.4,  - TSH 1.9, UA unremarkable - CT head-within normal limits - EDP discussed the case with neurology no further recommendations -Continue with neurochecks -Initiating IV fluids, oral lactulose  30 g twice daily -Monitoring ammonia level -EDP is consulted GI for further evaluation-appreciate recommendations

## 2024-08-28 NOTE — ED Notes (Signed)
 Reminded pt a urine sample is needed. PT unable to urinate at this time.

## 2024-08-28 NOTE — ED Provider Notes (Signed)
 [place] EMERGENCY DEPARTMENT AT [facility] Provider Note   CSN: [id number] Arrival date & time: 08/28/24  1023     Patient presents with: Altered Mental Status   Susan [name] is a 58 y.o. female.    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  She states that she has had some memory problems over the past day, per patient's husband yesterday she was more fatigued but today was somewhat confused.  This morning patient was seen by husband to be shuffling towards the bathroom and acting abnormally was deeply/closely inspecting the door frame she has no recollection of this.  She informs me that she has had some diarrhea.  No fevers and no abdominal pain.     Prior to Admission medications   Medication Sig Start Date End Date Taking? Authorizing Provider  carvedilol  (COREG ) 3.125 MG tablet Take 3.125 mg by mouth 2 (two) times daily.    [provider]  hydrochlorothiazide  (HYDRODIURIL ) 25 MG tablet TAKE 1 TABLET BY MOUTH DAILY 01/07/24   Corwin Antu, FNP  spironolactone  (ALDACTONE ) 50 MG tablet Take 1 tablet (50 mg total) by mouth daily. 06/10/24   Dugal, Tabitha, FNP  tirzepatide  (MOUNJARO ) 7.5 MG/0.5ML Pen Inject 7.5 mg into the skin once a week. 06/10/24   Dugal, Tabitha, FNP  ursodiol  (ACTIGALL ) 300 MG capsule TAKE 2 CAPSULES BY MOUTH TWICE  DAILY 10/22/23   Armbruster, Elspeth SQUIBB, MD    Allergies: Grapefruit concentrate, Sulfa antibiotics, Amlodipine, Crestor  [rosuvastatin ], Mushroom extract complex (obsolete), Shellfish allergy, Simvastatin , Strawberry extract, and Metformin  and related    Review of Systems  Updated Vital Signs Temp 97.6 F (36.4 C) (Oral)   Resp (!) 21   Physical Exam Vitals and nursing note reviewed.   Constitutional:      General: She is not in acute distress. HENT:     Head: Normocephalic and atraumatic.     Nose: Nose normal.  Eyes:     General: No scleral icterus. Cardiovascular:     Rate and Rhythm: Normal rate and regular rhythm.     Pulses: Normal pulses.     Heart sounds: Normal heart sounds.  Pulmonary:     Effort: Pulmonary effort is normal. No respiratory distress.     Breath sounds: No wheezing.  Abdominal:     Palpations: Abdomen is soft.     Tenderness: There is no abdominal tenderness.  Musculoskeletal:     Cervical back: Normal range of motion.     Right lower leg: No edema.     Left lower leg: No edema.  Skin:    General: Skin is warm and dry.     Capillary Refill: Capillary refill takes less than 2 seconds.  Neurological:     Mental Status: She is alert. Mental status is at baseline.     Comments: Alert and oriented to self, place, time and event.   Speech is fluent, clear without dysarthria or dysphasia.   Strength 5/5 in upper/lower extremities   Sensation intact in upper/lower extremities   Normal gait.  CN I not tested  CN II grossly intact visual fields bilaterally. Did not visualize posterior eye.  CN III, IV, VI PERRLA and EOMs intact bilaterally  CN V Intact sensation to sharp and light touch to the face  CN VII facial movements  symmetric  CN VIII not tested  CN IX, X no uvula deviation, symmetric rise of soft palate  CN XI 5/5 SCM and trapezius strength bilaterally  CN XII Midline tongue protrusion, symmetric L/R movements   Psychiatric:        Mood and Affect: Mood normal.        Behavior: Behavior normal.     (all labs ordered are listed, but only abnormal results are displayed) Labs Reviewed  COMPREHENSIVE METABOLIC PANEL WITH GFR  CBC  URINALYSIS, ROUTINE W REFLEX MICROSCOPIC  AMMONIA  I-STAT VENOUS BLOOD GAS, ED    EKG: None  Radiology: No results found.   Procedures   Medications Ordered in the ED - No data to  display  Clinical Course as of 08/28/24 1634  Fri Aug 28, 2024  1259 >60 ammonia [WF]  1448 Lact vs steroids [WF]  1453 Schemiy  [WF]    Clinical Course User Index [WF] Neldon Hamp RAMAN, [place]                                 Medical Decision Making Amount and/or Complexity of Data Reviewed Labs: ordered. Radiology: ordered.  Risk OTC drugs. Decision regarding hospitalization.   This patient presents to the ED for concern of memory issues, this involves a number of treatment options, and is a complaint that carries with it a moderate risk of complications and morbidity. A differential diagnosis was considered for the patient's symptoms which is discussed below:   The differential diagnosis for AMS is extensive and includes, but is not limited to: drug overdose - opioids, alcohol, sedatives, antipsychotics, drug withdrawal, others; Metabolic: hypoxia, hypoglycemia, hyperglycemia, hypercalcemia, hypernatremia, hyponatremia, uremia, hepatic encephalopathy, hypothyroidism, hyperthyroidism, vitamin B12 or thiamine deficiency, carbon monoxide poisoning, Wilson's disease, Lactic acidosis, DKA/HHOS; Infectious: meningitis, encephalitis, bacteremia/sepsis, urinary tract infection, pneumonia, neurosyphilis; Structural: Space-occupying lesion, (brain tumor, subdural hematoma, hydrocephalus,); Vascular: stroke, subarachnoid hemorrhage, coronary ischemia, hypertensive encephalopathy, CNS vasculitis, thrombotic thrombocytopenic purpura, disseminated intravascular coagulation, hyperviscosity; Psychiatric: Schizophrenia, depression; Other: Seizure, hypothermia, heat stroke, dementia    Co morbidities: Discussed in HPI   Brief History:  Patient is a 57 year old female with a past medical history significant for hypertension, DM 2, heart murmur, cirrhosis secondary to autoimmune disease, esophageal varices  She presents emergency room today with complaints of confusion.  She states that she has had  some memory problems over the past day, per patient's husband yesterday she was more fatigued but today was somewhat confused.  This morning patient was seen by husband to be shuffling towards the bathroom and acting abnormally was deeply/closely inspecting the door frame she has no recollection of this.  She informs me that she has had some diarrhea.  No fevers and no abdominal pain.    EMR reviewed including pt PMHx, past surgical history and past visits to ER.   See HPI for more details   Lab Tests:   I ordered and independently interpreted labs. Labs notable for CBC unremarkable CMP with mildly elevated magnesium  level slightly low at 1.5.  Coags pending urine unremarkable.  Ammonia elevated    Imaging Studies:  NAD. I personally reviewed all imaging studies and no acute abnormality found. I agree with radiology interpretation.  CXR and CT head nml    Cardiac Monitoring:  The patient was maintained on a cardiac monitor.  I personally viewed and interpreted the cardiac monitored which showed an underlying rhythm of: NSR EKG non-ischemic  Medicines ordered:  I ordered medication including mag ox, NS 1L for hydration.  Reevaluation of the patient after these medicines showed that the patient improved I have reviewed the patients home medicines and have made adjustments as needed   Critical Interventions:     Consults/Attending Physician   I discussed this case with my attending physician who cosigned this note including patient's presenting symptoms, physical exam, and planned diagnostics and interventions. Attending physician stated agreement with plan or made changes to plan which were implemented.  Amy Esterwood recommend electrolyte admission  Discussed with hospitalist who will admit.   Reevaluation:  After the interventions noted above I re-evaluated patient and found that they have :improved   Social Determinants of Health:      Problem List /  ED Course:  Patient with new onset hepatic encephalopathy with some confusion and abnormal behaviors admitted to hospitalist gastroenterology on board.   Dispostion:  After consideration of the diagnostic results and the patients response to treatment, I feel that the patent would benefit from admission    Final diagnoses:  Hepatic encephalopathy [facility])    ED Discharge Orders     None          Neldon Hamp RAMAN, [place] 08/28/24 1638    Pamella Ozell LABOR, DO 09/02/24 [phone]

## 2024-08-28 NOTE — Assessment & Plan Note (Signed)
-   History of autoimmune liver cirrhosis, primary biliary cirrhosis -Follows closely with her with gastroenterologist as an outpatient - Continue home medication of Ursodiol   - Total bilirubin 1.4,

## 2024-08-28 NOTE — ED Notes (Signed)
 Urine sent.  PT x1 to the rest room.

## 2024-08-28 NOTE — H&P (Signed)
 History and Physical   Patient: Susan [name]                            PCP: Corwin Antu, FNP                    DOB: 1966/03/31            DOA: 08/28/2024 FMW:[id number]             DOS: 08/28/2024, 3:36 PM  Corwin Antu, FNP  Patient coming from:   HOME  I have personally reviewed patient's medical records, in electronic medical records, including:  [facility] link, and care everywhere.    Chief Complaint:   Chief Complaint  Patient presents with   Altered Mental Status    History of present illness:    Susan [name] is a 58 year old female with extensive history of HTN, DM 2, autoimmune liver cirrhosis, esophageal varices, presents ED with chief complaint of confusion.  Patient is awake and alert now.  Per husband started having confusion, fatigue since yesterday.  This morning was shuffling her gait to the bathroom and was acting abnormal.   ED Evaluation: Blood pressure (!) 145/79, pulse 73, temperature 97.6 F (36.4 C), temperature source Oral, resp. rate 20, height 5' (1.524 m), weight 79.4 kg. LABs: CBC CMP reviewed, within normal to exception of glucose of 111, creatinine 1.18, Agnesian 1.5, albumin 3.1, ammonia level 75, bilirubin 1.4, TSH 1.9, UA unremarkable CT head-within normal limits  Patient Denies having: Fever, Chills, Cough, SOB, Chest Pain, Abd pain, N/V/D, headache, dizziness, lightheadedness,  Dysuria, Joint pain, rash, open wounds   Review of Systems: As per HPI, otherwise 10 point review of systems were negative.   ----------------------------------------------------------------------------------------------------------------------  Allergies  Allergen Reactions   Grapefruit Concentrate Other (See Comments)    blisters   Sulfa Antibiotics Rash and Other (See Comments)    Severe dehydration   Amlodipine Other (See Comments)    Heart rate fast   Crestor  [Rosuvastatin ]    Mushroom Extract Complex (Obsolete)     Throat swells    Shellfish Allergy     Hives    Simvastatin  Other (See Comments)    Brain fog, fatigue, felt 'terrible'.    Strawberry Extract     hives   Metformin  And Related Other (See Comments)    Gi effects    Home MEDs:  Prior to Admission medications   Medication Sig Start Date End Date Taking? Authorizing Provider  carvedilol  (COREG ) 3.125 MG tablet Take 3.125 mg by mouth 2 (two) times daily.    [provider]  hydrochlorothiazide  (HYDRODIURIL ) 25 MG tablet TAKE 1 TABLET BY MOUTH DAILY 01/07/24   Corwin Antu, FNP  spironolactone  (ALDACTONE ) 50 MG tablet Take 1 tablet (50 mg total) by mouth daily. 06/10/24   Dugal, Tabitha, FNP  tirzepatide  (MOUNJARO ) 7.5 MG/0.5ML Pen Inject 7.5 mg into the skin once a week. 06/10/24   Dugal, Tabitha, FNP  ursodiol  (ACTIGALL ) 300 MG capsule TAKE 2 CAPSULES BY MOUTH TWICE  DAILY 10/22/23   Armbruster, Elspeth SQUIBB, MD    PRN MEDs: bisacodyl , hydrALAZINE , HYDROmorphone  (DILAUDID ) injection, ipratropium, ondansetron  **OR** ondansetron  (ZOFRAN ) IV, oxyCODONE , senna-docusate, traZODone   Past Medical History:  Diagnosis Date   Allergy    Anemia    Cirrhosis (HCC)    Constipation    Contact lens/glasses fitting    wears contacts or glasses   Diabetes mellitus without complication (HCC)  Edema of both lower extremities    Food allergy    Gastric varices    GERD (gastroesophageal reflux disease)    occ pepcid ac   Heart murmur    Hyperlipidemia    Hypertension    Joint pain    Periumbilical hernia    Primary biliary cholangitis (HCC)    AMA negative PBC   Snores    Vitamin D  deficiency     Past Surgical History:  Procedure Laterality Date   BREAST BIOPSY Left 10/02/2023   MM LT BREAST BX W LOC DEV 1ST LESION IMAGE BX SPEC STEREO GUIDE 10/02/2023 [facility]   BREAST BIOPSY Left 10/02/2023   MM LT BREAST BX W LOC DEV EA AD LESION IMG BX SPEC STEREO GUIDE 10/02/2023 [facility]   DILATION AND CURETTAGE OF UTERUS  11/1995, 10/1997   LEEP      LIVER BIOPSY     TUBAL LIGATION  10/1997   UMBILICAL HERNIA REPAIR N/A 08/11/2013   Procedure: HERNIA REPAIR UMBILICAL ADULT;  Surgeon: Lynwood MALVA Pina, MD;  Location: [facility];  Service: General;  Laterality: N/A;     reports that she has never smoked. She has never used smokeless tobacco. She reports current alcohol use. She reports that she does not use drugs.   Family History  Problem Relation Age of Onset   Breast cancer Mother    Schizophrenia Mother    Depression Mother    Uterine cancer Mother    Hypertension Mother    Obesity Mother    Renal Disease Father    Diabetes Father    Other Father        Agent Orange   Lung cancer Maternal Grandmother    Lung cancer Maternal Grandfather    Heart disease Maternal Uncle    Diabetes Other        Aunts and Uncles on both sides   Heart disease Maternal Aunt    Colon cancer Neg Hx    Esophageal cancer Neg Hx    Stomach cancer Neg Hx    Rectal cancer Neg Hx     Physical Exam:   Vitals:   08/28/24 1035 08/28/24 1045 08/28/24 1047 08/28/24 1515  BP:  (!) 145/79  135/71  Pulse:  73    Resp: (!) 21 20  20   Temp: 97.6 F (36.4 C) 97.6 F (36.4 C)    TempSrc: Oral Oral    Weight:   79.4 kg   Height:   5' (1.524 m)    Constitutional: NAD, calm, comfortable but confused  Eyes: PERRL, lids and conjunctivae normal ENMT: Mucous membranes are moist. Posterior pharynx clear of any exudate or lesions.Normal dentition.  Neck: normal, supple, no masses, no thyromegaly Respiratory: clear to auscultation bilaterally, no wheezing, no crackles. Normal respiratory effort. No accessory muscle use.  Cardiovascular: Regular rate and rhythm, no murmurs / rubs / gallops. No extremity edema. 2+ pedal pulses. No carotid bruits.  Abdomen: no tenderness, no masses palpated. No hepatosplenomegaly. Bowel sounds positive.  Musculoskeletal: no clubbing / cyanosis. No joint deformity upper and lower extremities. Good ROM, no  contractures. Normal muscle tone.  Neurologic: CN II-XII grossly intact. Sensation intact, DTR normal. Strength 5/5 in all 4.  Psychiatric: Confused   Normal judgment and insight. Alert and oriented x 3. Normal mood.  Skin: no rashes, lesions, ulcers. No induration   Labs on admission:    I have personally reviewed following labs and imaging studies  CBC: Recent Labs  Lab 08/28/24 1054 08/28/24 1212  WBC 6.5  --   HGB 13.9 13.3  HCT 40.9 39.0  MCV 89.7  --   PLT 152  --    Basic Metabolic Panel: Recent Labs  Lab 08/28/24 1054 08/28/24 1055 08/28/24 1212  NA 140  --  140  K 3.6  --  3.6  CL 107  --   --   CO2 24  --   --   GLUCOSE 111*  --   --   BUN 14  --   --   CREATININE 1.18*  --   --   CALCIUM  10.0  --   --   MG  --  1.5*  --    GFR: Estimated Creatinine Clearance: 49.1 mL/min (A) (by C-G formula based on SCr of 1.18 mg/dL (H)). Liver Function Tests: Recent Labs  Lab 08/28/24 1054  AST 37  ALT 22  ALKPHOS 74  BILITOT 1.4*  PROT 7.3  ALBUMIN 3.1*   No results for input(s): LIPASE, AMYLASE in the last 168 hours. Recent Labs  Lab 08/28/24 1119  AMMONIA 75*    Thyroid  Function Tests: Recent Labs    08/28/24 1046  TSH 1.988   Anemia Panel: No results for input(s): VITAMINB12, FOLATE, FERRITIN, TIBC, IRON, RETICCTPCT in the last 72 hours. Urine analysis:    Component Value Date/Time   COLORURINE YELLOW 08/28/2024 1334   APPEARANCEUR CLEAR 08/28/2024 1334   LABSPEC 1.010 08/28/2024 1334   PHURINE 6.0 08/28/2024 1334   GLUCOSEU NEGATIVE 08/28/2024 1334   HGBUR NEGATIVE 08/28/2024 1334   BILIRUBINUR NEGATIVE 08/28/2024 1334   KETONESUR NEGATIVE 08/28/2024 1334   PROTEINUR NEGATIVE 08/28/2024 1334   NITRITE NEGATIVE 08/28/2024 1334   LEUKOCYTESUR NEGATIVE 08/28/2024 1334    Last A1C:  Lab Results  Component Value Date   HGBA1C 5.4 06/10/2024     Radiologic Exams on Admission:   DG Chest 2 View Result Date:  08/28/2024 EXAM: 2 VIEW(S) XRAY OF THE CHEST 08/28/2024 11:37:00 AM COMPARISON: Chest radiographs 08/18/13 CLINICAL HISTORY: 58 year old female. AMS. FINDINGS: LUNGS AND PLEURA: Chronic low lung volumes. No confluent lung opacity. No pleural effusion. No pneumothorax. HEART AND MEDIASTINUM: No acute abnormality of the cardiac and mediastinal silhouettes. BONES AND SOFT TISSUES: Mild thoracic spondylosis. Negative visible bowel gas pattern. IMPRESSION: 1. No acute cardiopulmonary abnormality. Electronically signed by: Helayne Hurst MD 08/28/2024 12:04 PM EST RP Workstation: [id number]   CT Head Wo Contrast Result Date: 08/28/2024 EXAM: CT HEAD WITHOUT CONTRAST 08/28/2024 11:00:43 AM TECHNIQUE: CT of the head was performed without the administration of intravenous contrast. Automated exposure control, iterative reconstruction, and/or weight based adjustment of the mA/kV was utilized to reduce the radiation dose to as low as reasonably achievable. COMPARISON: None available. CLINICAL HISTORY: 58 year old female with cognitive difficulty and altered mental status over the past month. Frequent dizziness. FINDINGS: BRAIN AND VENTRICLES: Brain volume appears normal for age. No acute hemorrhage. No evidence of acute infarct. No hydrocephalus. No extra-axial collection. No mass effect or midline shift. Gray white differentiation is within normal limits throughout the brain. No encephalomalacia identified. No suspicious intracranial vascular hyperdensity. Calcified atherosclerosis at the skull base. ORBITS: No acute abnormality. SINUSES: Paranasal sinuses, tympanic cavities and mastoids are well aerated. SOFT TISSUES AND SKULL: No acute soft tissue abnormality. No skull fracture. IMPRESSION: 1. Normal for age non-contrast head CT. Electronically signed by: Helayne Hurst MD 08/28/2024 11:08 AM EST RP Workstation: [id number]    EKG:   Independently reviewed.  Orders placed  or performed during the hospital encounter of  08/28/24   EKG 12-Lead   EKG 12-Lead   EKG 12-Lead   ---------------------------------------------------------------------------------------------------------------------------------------    Assessment / Plan:   Principal Problem:   Hepatic encephalopathy (HCC) Active Problems:   Essential hypertension   Primary biliary cholangitis (HCC)   Hyperlipidemia   Gastric varices   Iron deficiency anemia due to chronic blood loss   Statin intolerance   Hyperglycemia   Assessment and Plan: * Hepatic encephalopathy (HCC) - History of autoimmune liver cirrhosis - LFTs within normal limits   - Ammonia level 75, bilirubin 1.4,  - TSH 1.9, UA unremarkable - CT head-within normal limits - EDP discussed the case with neurology no further recommendations -Continue with neurochecks -Initiating IV fluids, oral lactulose  30 g twice daily -Monitoring ammonia level -EDP is consulted GI for further evaluation-appreciate recommendations  Primary biliary cholangitis (HCC) - History of autoimmune liver cirrhosis, primary biliary cirrhosis -Follows closely with her with gastroenterologist as an outpatient - Continue home medication of Ursodiol   - Total bilirubin 1.4,  Essential hypertension - Blood pressure elevated 145/79 -Reviewing home medications: Which includes Coreg , HCTZ, Aldactone  Will restart accordingly  Gastric varices - Initiating PPI -No signs of bleeding  Hyperlipidemia -Not on statins due to autoimmune hepatic cirrhosis  Iron deficiency anemia due to chronic blood loss Not on any iron supplements -: Stable, check an iron level  Hyperglycemia Per patient and family in record patient is not diabetic -Most recent A1c 5.4   - 2 months ago -On Mounjaro  -for weight loss      Consults called: Curbside consultation neurology, consultation to  GI --------------------------------------------------------------------------------------------------------------------------------------- DVT prophylaxis:  heparin  injection 5,000 Units Start: 08/28/24 1500 SCDs Start: 08/28/24 1455   Code Status:   Code Status: Full Code   Admission status: Patient will be admitted as Observation, with a greater than 2 midnight length of stay. Level of care: Med-Surg   Family Communication:  Husband bedside  (The above findings and plan of care has been discussed with patient in detail, the patient expressed understanding and agreement of above plan)  --------------------------------------------------------------------------------------------------------------------------------------- Disposition Plan:  Anticipated 1-2 days Status is: Observation The patient remains OBS appropriate and will d/c before 2 midnights.   -----------------------------------------------------------------------------------------------------------------------------------  Time spent:  60  Min.  Was spent seeing and evaluating the patient, reviewing all medical records, drawn plan of care.  SIGNED: Adriana DELENA Grams, MD, FHM. FAAFP. [facility], Pager  (Please use [email or web address] to page/ or secure chat through epic) If 7PM-7AM, please contact night-coverage [email or web address],  08/28/2024, 3:36 PM

## 2024-08-28 NOTE — ED Notes (Signed)
Phlebotomy contacted for blood work.
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
## 2024-08-28 NOTE — ED Notes (Deleted)
PT brief changed at this time.

## 2024-08-28 NOTE — Assessment & Plan Note (Signed)
-   Blood pressure elevated 145/79 -Reviewing home medications: Which includes Coreg , HCTZ, Aldactone  Will restart accordingly

## 2024-08-28 NOTE — Plan of Care (Signed)

## 2024-08-28 NOTE — Assessment & Plan Note (Signed)
-   Initiating PPI -No signs of bleeding

## 2024-08-28 NOTE — Assessment & Plan Note (Addendum)
-  Not on statins due to autoimmune hepatic cirrhosis

## 2024-08-28 NOTE — Hospital Course (Addendum)
 BRITTON PERKINSON is a 58 year old female with extensive history of HTN, DM 2, autoimmune liver cirrhosis, esophageal varices, presents ED with chief complaint of confusion.  Patient is awake and alert now.  Per husband started having confusion, fatigue since yesterday.  This morning was shuffling her gait to the bathroom and was acting abnormal.   ED Evaluation: Blood pressure (!) 145/79, pulse 73, temperature 97.6 F (36.4 C), temperature source Oral, resp. rate 20, height 5' (1.524 m), weight 79.4 kg. LABs: CBC CMP reviewed, within normal to exception of glucose of 111, creatinine 1.18, Agnesian 1.5, albumin 3.1, ammonia level 75, bilirubin 1.4, TSH 1.9, UA unremarkable CT head-within normal limits

## 2024-08-28 NOTE — ED Notes (Signed)
 PT reminded a urine sample is needed. PT to use call light when able to provide a sample.  Fluids started, breathing is even an unlabored.  Two support persons at bedside.

## 2024-08-29 DIAGNOSIS — K754 Autoimmune hepatitis: Secondary | ICD-10-CM

## 2024-08-29 DIAGNOSIS — K743 Primary biliary cirrhosis: Secondary | ICD-10-CM

## 2024-08-29 LAB — GLUCOSE, CAPILLARY: Glucose-Capillary: 113 mg/dL — ABNORMAL HIGH (ref 70–99)

## 2024-08-29 LAB — COMPREHENSIVE METABOLIC PANEL WITH GFR
ALT: 19 U/L (ref 0–44)
AST: 34 U/L (ref 15–41)
Albumin: 2.4 g/dL — ABNORMAL LOW (ref 3.5–5.0)
Alkaline Phosphatase: 62 U/L (ref 38–126)
Anion gap: 8 (ref 5–15)
BUN: 13 mg/dL (ref 6–20)
CO2: 21 mmol/L — ABNORMAL LOW (ref 22–32)
Calcium: 8.3 mg/dL — ABNORMAL LOW (ref 8.9–10.3)
Chloride: 111 mmol/L (ref 98–111)
Creatinine, Ser: 0.96 mg/dL (ref 0.44–1.00)
GFR, Estimated: >60 mL/min (ref >60)
Glucose, Bld: 104 mg/dL — ABNORMAL HIGH (ref 70–99)
Potassium: 3.4 mmol/L — ABNORMAL LOW (ref 3.5–5.1)
Sodium: 140 mmol/L (ref 135–145)
Total Bilirubin: 1.5 mg/dL — ABNORMAL HIGH (ref 0.0–1.2)
Total Protein: 5.7 g/dL — ABNORMAL LOW (ref 6.5–8.1)

## 2024-08-29 LAB — PROTIME-INR
INR: 1.3 — ABNORMAL HIGH (ref 0.8–1.2)
Prothrombin Time: 17 s — ABNORMAL HIGH (ref 11.4–15.2)

## 2024-08-29 LAB — MAGNESIUM: Magnesium: 1.5 mg/dL — ABNORMAL LOW (ref 1.7–2.4)

## 2024-08-29 LAB — AMMONIA: Ammonia: 58 umol/L — ABNORMAL HIGH (ref 9–35)

## 2024-08-29 LAB — APTT: aPTT: 35 s (ref 24–36)

## 2024-08-29 MED ORDER — MAGNESIUM SULFATE 2 GM/50ML IV SOLN
2.0000 g | Freq: Once | INTRAVENOUS | Status: AC
  Administered 2024-08-29: 2 g via INTRAVENOUS
  Filled 2024-08-29: qty 50

## 2024-08-29 MED ORDER — POTASSIUM CHLORIDE CRYS ER 20 MEQ PO TBCR
40.0000 meq | EXTENDED_RELEASE_TABLET | Freq: Once | ORAL | Status: AC
  Administered 2024-08-29: 40 meq via ORAL
  Filled 2024-08-29: qty 2

## 2024-08-29 MED ORDER — MAGNESIUM OXIDE -MG SUPPLEMENT 400 (240 MG) MG PO TABS: 400.0000 mg | ORAL_TABLET | Freq: Two times a day (BID) | ORAL | 0 refills | Status: AC

## 2024-08-29 MED ORDER — LACTULOSE 10 GM/15ML PO SOLN: 30.0000 g | Freq: Two times a day (BID) | ORAL | 1 refills | Status: DC

## 2024-08-29 NOTE — Plan of Care (Signed)

## 2024-08-29 NOTE — TOC Transition Note (Signed)
 Transition of Care [facility]) - Discharge Note   Patient Details  Name: Susan [name] MRN: [id number] Date of Birth: 20-Mar-1966  Transition of Care [facility]) CM/SW Contact:  Tom-Johnson, Ellyssa Zagal Daphne, RN Phone Number: 08/29/2024, 11:33 AM   Clinical Narrative:     Patient is scheduled for discharge today.  Outpatient f/u, hospital f/u and discharge instructions on AVS. No ICM needs or recommendations noted. Husband, Dwayne at bedside and will transport at discharge.  No further ICM needs noted.      Final next level of care: Home/Self Care Barriers to Discharge: Barriers Resolved   Patient Goals and CMS Choice Patient states their goals for this hospitalization and ongoing recovery are:: To return home CMS Medicare.gov Compare Post Acute Care list provided to:: Patient Choice offered to / list presented to : Patient      Discharge Placement                Patient to be transferred to facility by: Husband Name of family member notified: Dwayne    Discharge Plan and Services Additional resources added to the After Visit Summary for                                       Social Drivers of Health (SDOH) Interventions SDOH Screenings   Food Insecurity: No Food Insecurity (08/28/2024)  Housing: Low Risk  (08/28/2024)  Transportation Needs: No Transportation Needs (08/28/2024)  Utilities: Not At Risk (08/28/2024)  Alcohol Screen: Low Risk  (05/07/2023)  Depression (PHQ2-9): Medium Risk (08/17/2024)  Financial Resource Strain: Low Risk  (08/17/2024)  Physical Activity: Insufficiently Active (08/17/2024)  Social Connections: Moderately Integrated (08/17/2024)  Stress: No Stress Concern Present (08/17/2024)  Tobacco Use: Low Risk  (08/17/2024)     Readmission Risk Interventions     No data to display

## 2024-08-29 NOTE — Plan of Care (Signed)
  Problem: Education: Goal: Knowledge of General Education information will improve Description: Including pain rating scale, medication(s)/side effects and non-pharmacologic comfort measures 08/29/2024 1357 by Wilbern Isaiah BRAVO, RN Outcome: Adequate for Discharge 08/29/2024 1357 by Wilbern Isaiah BRAVO, RN Outcome: Adequate for Discharge   Problem: Health Behavior/Discharge Planning: Goal: Ability to manage health-related needs will improve 08/29/2024 1357 by Wilbern Isaiah BRAVO, RN Outcome: Adequate for Discharge 08/29/2024 1357 by Wilbern Isaiah BRAVO, RN Outcome: Adequate for Discharge   Problem: Clinical Measurements: Goal: Ability to maintain clinical measurements within normal limits will improve 08/29/2024 1357 by Wilbern Isaiah BRAVO, RN Outcome: Adequate for Discharge 08/29/2024 1357 by Wilbern Isaiah BRAVO, RN Outcome: Adequate for Discharge Goal: Will remain free from infection 08/29/2024 1357 by Wilbern Isaiah BRAVO, RN Outcome: Adequate for Discharge 08/29/2024 1357 by Wilbern Isaiah BRAVO, RN Outcome: Adequate for Discharge Goal: Diagnostic test results will improve 08/29/2024 1357 by Wilbern Isaiah BRAVO, RN Outcome: Adequate for Discharge 08/29/2024 1357 by Wilbern Isaiah BRAVO, RN Outcome: Adequate for Discharge Goal: Respiratory complications will improve 08/29/2024 1357 by Wilbern Isaiah BRAVO, RN Outcome: Adequate for Discharge 08/29/2024 1357 by Wilbern Isaiah BRAVO, RN Outcome: Adequate for Discharge Goal: Cardiovascular complication will be avoided 08/29/2024 1357 by Wilbern Isaiah BRAVO, RN Outcome: Adequate for Discharge 08/29/2024 1357 by Wilbern Isaiah BRAVO, RN Outcome: Adequate for Discharge   Problem: Activity: Goal: Risk for activity intolerance will decrease 08/29/2024 1357 by Wilbern Isaiah BRAVO, RN Outcome: Adequate for Discharge 08/29/2024 1357 by Wilbern Isaiah BRAVO, RN Outcome: Adequate for Discharge   Problem: Nutrition: Goal: Adequate nutrition will be  maintained 08/29/2024 1357 by Wilbern Isaiah BRAVO, RN Outcome: Adequate for Discharge 08/29/2024 1357 by Wilbern Isaiah BRAVO, RN Outcome: Adequate for Discharge   Problem: Coping: Goal: Level of anxiety will decrease 08/29/2024 1357 by Wilbern Isaiah BRAVO, RN Outcome: Adequate for Discharge 08/29/2024 1357 by Wilbern Isaiah BRAVO, RN Outcome: Adequate for Discharge   Problem: Elimination: Goal: Will not experience complications related to bowel motility 08/29/2024 1357 by Wilbern Isaiah BRAVO, RN Outcome: Adequate for Discharge 08/29/2024 1357 by Wilbern Isaiah BRAVO, RN Outcome: Adequate for Discharge Goal: Will not experience complications related to urinary retention 08/29/2024 1357 by Wilbern Isaiah BRAVO, RN Outcome: Adequate for Discharge 08/29/2024 1357 by Wilbern Isaiah BRAVO, RN Outcome: Adequate for Discharge   Problem: Pain Managment: Goal: General experience of comfort will improve and/or be controlled 08/29/2024 1357 by Wilbern Isaiah BRAVO, RN Outcome: Adequate for Discharge 08/29/2024 1357 by Wilbern Isaiah BRAVO, RN Outcome: Adequate for Discharge   Problem: Safety: Goal: Ability to remain free from injury will improve 08/29/2024 1357 by Wilbern Isaiah BRAVO, RN Outcome: Adequate for Discharge 08/29/2024 1357 by Wilbern Isaiah BRAVO, RN Outcome: Adequate for Discharge   Problem: Skin Integrity: Goal: Risk for impaired skin integrity will decrease 08/29/2024 1357 by Wilbern Isaiah BRAVO, RN Outcome: Adequate for Discharge 08/29/2024 1357 by Wilbern Isaiah BRAVO, RN Outcome: Adequate for Discharge

## 2024-08-29 NOTE — Discharge Summary (Signed)
 Physician Discharge Summary   Patient: Susan [name] MRN: [id number] DOB: 1966-01-18  Admit date:     08/28/2024  Discharge date: 08/29/24  Discharge Physician: MDALA-GAUSI, GOLDEN PILLOW   PCP: Corwin Antu, FNP   Recommendations at discharge:   Follow-up with gastroenterology as scheduled. Discontinue Mounjaro .  Discharge Diagnoses: Principal Problem:   Hepatic encephalopathy (HCC) Active Problems:   Essential hypertension   Primary biliary cholangitis (HCC)   Hyperlipidemia   Gastric varices   Iron deficiency anemia due to chronic blood loss   Statin intolerance   Hyperglycemia  Resolved Problems:   DM2 (diabetes mellitus, type 2) [facility])  Hospital Course: Susan [name] is a 58 year old female with extensive history of HTN, DM 2, autoimmune liver cirrhosis, esophageal varices, who presented to the ED with chief complaint of confusion.  Patient was noted to have an elevated ammonia of 75 and was diagnosed with hepatic encephalopathy. She was started on lactulose  and quickly improved.  The rest of the hospital course is in problem-based format below:     Assessment and Plan:   Hepatic encephalopathy (HCC) - History of autoimmune liver cirrhosis - LFTs within normal limits   - Ammonia level 75, bilirubin 1.4 - TSH 1.9, UA unremarkable - CT head-within normal limits - EDP discussed the case with neurology no further recommendations. - GI was consulted.  - Patient was started on oral lactulose  30 g twice daily, and mental status improved.  - Noted that patient has not been eating well and had attributed her lack of bowel movements to poor PO intake.  - Admits to poor appetite, likely due to Mounjaro . - Gastroenterologist and I agreed the patient should be advised to discontinue Mounjaro  in the setting of her chronic liver disease. - Patient has follow-up with gastroenterology in the coming week.  Primary biliary cholangitis (HCC) - History of autoimmune liver  cirrhosis, primary biliary cirrhosis -Follows closely with her with gastroenterologist as an outpatient - Continued home medication of Ursodiol   -Outpatient follow-up in the coming week.  Essential hypertension -Her home medications were continued.  Hyperlipidemia -Not on statins due to autoimmune hepatic cirrhosis  Class I Obesity -Body mass index is 34.1 kg/m.  - Patient has been on Mounjaro  for several months. - Patient advised to discontinue  Mounjaro  given its likely impact on her gastrointestinal/hepatic issues.      Consultants: Gastroenterology Procedures performed: n/a  Disposition: Home Diet recommendation:  Discharge Diet Orders (From admission, onward)     Start     Ordered   08/29/24 0000  Diet - low sodium heart healthy        08/29/24 1117           Regular diet DISCHARGE MEDICATION: Allergies as of 08/29/2024       Reactions   Grapefruit Concentrate Other (See Comments)   blisters   Sulfa Antibiotics Rash   Severe dehydration   Amlodipine Other (See Comments)   Heart rate fast   Crestor  [rosuvastatin ]    Brain fog, fatigue, felt 'terrible'.    Mushroom Extract Complex (obsolete)    Throat swells   Shellfish Allergy Hives      Simvastatin     Brain fog, fatigue, felt 'terrible'.    Strawberry Extract    hives   Metformin  And Related Diarrhea   Weakness        Medication List     STOP taking these medications    tirzepatide  7.5 MG/0.5ML Pen Commonly known as: MOUNJARO   TAKE these medications    carvedilol  3.125 MG tablet Commonly known as: COREG  Take 3.125 mg by mouth 2 (two) times daily.   hydrochlorothiazide  25 MG tablet Commonly known as: HYDRODIURIL  TAKE 1 TABLET BY MOUTH DAILY   lactulose  10 GM/15ML solution Commonly known as: CHRONULAC  Take 45 mLs (30 g total) by mouth 2 (two) times daily.   magnesium  oxide 400 (240 Mg) MG tablet Commonly known as: MAG-OX Take 1 tablet (400 mg total) by mouth 2 (two) times  daily.   spironolactone  50 MG tablet Commonly known as: ALDACTONE  Take 1 tablet (50 mg total) by mouth daily.   ursodiol  300 MG capsule Commonly known as: ACTIGALL  TAKE 2 CAPSULES BY MOUTH TWICE  DAILY        Discharge Exam: Filed Weights   08/28/24 1047 08/29/24 0442  Weight: 79.4 kg 79.2 kg   Physical Exam on Day of Discharge   General: Alert, cheerful, oriented X3  Oral cavity: moist mucous membranes  Neck: supple  Chest: clear to auscultation. No crackles, no wheezes  CVS: S1,S2 RRR. No murmurs  Abd: No distention, soft, non-tender. No masses palpable  Extr: No edema    Condition at discharge: stable  The results of significant diagnostics from this hospitalization (including imaging, microbiology, ancillary and laboratory) are listed below for reference.   Imaging Studies: DG Chest 2 View Result Date: 08/28/2024 EXAM: 2 VIEW(S) XRAY OF THE CHEST 08/28/2024 11:37:00 AM COMPARISON: Chest radiographs 08/18/13 CLINICAL HISTORY: 58 year old female. AMS. FINDINGS: LUNGS AND PLEURA: Chronic low lung volumes. No confluent lung opacity. No pleural effusion. No pneumothorax. HEART AND MEDIASTINUM: No acute abnormality of the cardiac and mediastinal silhouettes. BONES AND SOFT TISSUES: Mild thoracic spondylosis. Negative visible bowel gas pattern. IMPRESSION: 1. No acute cardiopulmonary abnormality. Electronically signed by: Helayne Hurst MD 08/28/2024 12:04 PM EST RP Workstation: [id number]   CT Head Wo Contrast Result Date: 08/28/2024 EXAM: CT HEAD WITHOUT CONTRAST 08/28/2024 11:00:43 AM TECHNIQUE: CT of the head was performed without the administration of intravenous contrast. Automated exposure control, iterative reconstruction, and/or weight based adjustment of the mA/kV was utilized to reduce the radiation dose to as low as reasonably achievable. COMPARISON: None available. CLINICAL HISTORY: 58 year old female with cognitive difficulty and altered mental status over the past  month. Frequent dizziness. FINDINGS: BRAIN AND VENTRICLES: Brain volume appears normal for age. No acute hemorrhage. No evidence of acute infarct. No hydrocephalus. No extra-axial collection. No mass effect or midline shift. Gray white differentiation is within normal limits throughout the brain. No encephalomalacia identified. No suspicious intracranial vascular hyperdensity. Calcified atherosclerosis at the skull base. ORBITS: No acute abnormality. SINUSES: Paranasal sinuses, tympanic cavities and mastoids are well aerated. SOFT TISSUES AND SKULL: No acute soft tissue abnormality. No skull fracture. IMPRESSION: 1. Normal for age non-contrast head CT. Electronically signed by: Helayne Hurst MD 08/28/2024 11:08 AM EST RP Workstation: [id number]    Microbiology: No results found for this or any previous visit.  Labs: CBC: Recent Labs  Lab 08/28/24 1054 08/28/24 1212  WBC 6.5  --   HGB 13.9 13.3  HCT 40.9 39.0  MCV 89.7  --   PLT 152  --    Basic Metabolic Panel: Recent Labs  Lab 08/28/24 1054 08/28/24 1055 08/28/24 1212 08/28/24 1754 08/29/24 0553 08/29/24 0752  NA 140  --  140  --  140  --   K 3.6  --  3.6  --  3.4*  --   CL 107  --   --   --  111  --   CO2 24  --   --   --  21*  --   GLUCOSE 111*  --   --   --  104*  --   BUN 14  --   --   --  13  --   CREATININE 1.18*  --   --   --  0.96  --   CALCIUM  10.0  --   --   --  8.3*  --   MG  --  1.5*  --  1.5*  --  1.5*  PHOS  --   --   --  3.4  --   --    Liver Function Tests: Recent Labs  Lab 08/28/24 1054 08/29/24 0553  AST 37 34  ALT 22 19  ALKPHOS 74 62  BILITOT 1.4* 1.5*  PROT 7.3 5.7*  ALBUMIN 3.1* 2.4*   CBG: Recent Labs  Lab 08/29/24 0836  GLUCAP 113*    Discharge time spent: greater than 30 minutes.  Signed: MDALA-GAUSI, Alorah Mcree AGATHA, MD [facility] 08/29/2024

## 2024-08-29 NOTE — Consult Note (Addendum)
 Consultation  Referring Provider: [facility]Shahmehdi Primary Care Physician:  Corwin Antu, FNP Primary Gastroenterologist:  Dr. Armbruster/GI/ also [facility]  Reason for Consultation: Hepatic encephalopathy  HPI: Susan [name] is a 58 y.o. female known to Dr. Leigh, last seen in our office April 2024.  Patient has also been followed by [facility].  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  Disease complicated by development of cirrhosis, and gastric varices.  Also with history of diabetes mellitus, hypertension and hyperlipidemia. She is maintained on Actigall , and Coreg  for portal hypertension. Last EGD 2019 with no esophageal varices, possible type I isolated gastric varices views limited due to angulated proximal stomach. Colonoscopy at that same setting with a 3 mm polyp removed from the cecum 1 other diminutive polyp removed from the cecum internal hemorrhoids noted and hypertrophied anal papillae  Last ultrasound August 2025 gallbladder containing a single small gallstone, CBD 3 mm, stable cirrhotic appearing liver no focal hepatic mass no ductal dilation, portal vein patent, no ascites  Patient to the ER yesterday after onset of confusion/altered mental status.  Patient's husband had noted that she was having memory issues over the previous 24 hours had noted her to be more fatigued and not walking normally.  CT head-normal noncontrast head CT Chest x-ray-no acute cardiopulmonary abnormality No abdominal imaging done  Labs show WBC 6.5/hemoglobin 13.9/hematocrit 40.9/platelets 152 Sodium 140/potassium 3.6/BUN 14/creatinine 1.18 T. bili 1.4/AST 37/ALT 22/alk phos 74 Venous ammonia 75 UA negative Drug screen pending Pro time 17.4/INR 1.4  Today ammonia 58  MELD 3.0: 14 at 08/29/2024  5:53 AM MELD-Na: 11 at 08/29/2024  5:53 AM Calculated from: Serum Creatinine: 0.96 mg/dL (Using min of 1  mg/dL) at 87/11/7972  4:46 AM Serum Sodium: 140 mmol/L (Using max of 137 mmol/L) at 08/29/2024  5:53 AM Total Bilirubin: 1.5 mg/dL at 87/11/7972  4:46 AM Serum Albumin: 2.4 g/dL at 87/11/7972  4:46 AM INR(ratio): 1.3 at 08/29/2024  5:53 AM Age at listing (hypothetical): 58 years Sex: Female at 08/29/2024  5:53 AM   Patient's husband says that she is pretty much back to baseline today.  She says that she has not been feeling well over the past couple of weeks just sort of fatigued and a bit out of it.  She has not had any abdominal pain, no nausea or vomiting, no fever or chills, no melena or hematochezia. No increase in abdominal girth. She has been taking Mounjaro  for the past several months and has lost a total of about 40 pounds while on Mounjaro .  She had lost some on her own prior to that time.  She is taking it for weight loss. She did have an increase in dosage within the past month. She and her husband both aware that she has had a lot of issues with constipation, and poor appetite.  Occasional bouts of diarrhea and gets right back to constipation the following day.   Past Medical History:  Diagnosis Date   Allergy    Anemia    Cirrhosis (HCC)    Constipation    Contact lens/glasses fitting    wears contacts or glasses   Diabetes mellitus without complication (HCC)    Edema of both lower extremities    Food allergy    Gastric varices    GERD (gastroesophageal reflux disease)    occ pepcid ac   Heart murmur    Hyperlipidemia    Hypertension    Joint pain  Periumbilical hernia    Primary biliary cholangitis (HCC)    AMA negative PBC   Snores    Vitamin D  deficiency     Past Surgical History:  Procedure Laterality Date   BREAST BIOPSY Left 10/02/2023   MM LT BREAST BX W LOC DEV 1ST LESION IMAGE BX SPEC STEREO GUIDE 10/02/2023 [facility]   BREAST BIOPSY Left 10/02/2023   MM LT BREAST BX W LOC DEV EA AD LESION IMG BX SPEC STEREO GUIDE 10/02/2023 [facility]    DILATION AND CURETTAGE OF UTERUS  11/1995, 10/1997   LEEP     LIVER BIOPSY     TUBAL LIGATION  10/1997   UMBILICAL HERNIA REPAIR N/A 08/11/2013   Procedure: HERNIA REPAIR UMBILICAL ADULT;  Surgeon: Lynwood MALVA Pina, MD;  Location: [facility];  Service: General;  Laterality: N/A;    Prior to Admission medications   Medication Sig Start Date End Date Taking? Authorizing Provider  carvedilol  (COREG ) 3.125 MG tablet Take 3.125 mg by mouth 2 (two) times daily.   Yes [provider]  hydrochlorothiazide  (HYDRODIURIL ) 25 MG tablet TAKE 1 TABLET BY MOUTH DAILY 01/07/24  Yes Dugal, Tabitha, FNP  spironolactone  (ALDACTONE ) 50 MG tablet Take 1 tablet (50 mg total) by mouth daily. 06/10/24  Yes Dugal, Tabitha, FNP  tirzepatide  (MOUNJARO ) 7.5 MG/0.5ML Pen Inject 7.5 mg into the skin once a week. 06/10/24  Yes Dugal, Tabitha, FNP  ursodiol  (ACTIGALL ) 300 MG capsule TAKE 2 CAPSULES BY MOUTH TWICE  DAILY 10/22/23  Yes Armbruster, Elspeth SQUIBB, MD    Current Facility-Administered Medications  Medication Dose Route Frequency Provider Last Rate Last Admin   0.9 %  sodium chloride  infusion   Intravenous Continuous Shahmehdi, Seyed A, MD 125 mL/hr at 08/29/24 0135 New Bag at 08/29/24 0135   bisacodyl  (DULCOLAX) EC tablet 5 mg  5 mg Oral Daily PRN Willette Adriana LABOR, MD       carvedilol  (COREG ) tablet 3.125 mg  3.125 mg Oral BID Willette Adriana A, MD   3.125 mg at 08/29/24 0949   heparin  injection 5,000 Units  5,000 Units Subcutaneous Q8H Shahmehdi, Adriana A, MD   5,000 Units at 08/29/24 9373   hydrALAZINE  (APRESOLINE ) injection 10 mg  10 mg Intravenous Q4H PRN Shahmehdi, Adriana LABOR, MD       hydrochlorothiazide  (HYDRODIURIL ) tablet 25 mg  25 mg Oral Daily Shahmehdi, Seyed A, MD   25 mg at 08/29/24 0047   HYDROmorphone  (DILAUDID ) injection 0.5-1 mg  0.5-1 mg Intravenous Q2H PRN Shahmehdi, Seyed A, MD       ipratropium (ATROVENT ) nebulizer solution 0.5 mg  0.5 mg Nebulization Q6H PRN Shahmehdi, Seyed A, MD        lactulose  (CHRONULAC ) 10 GM/15ML solution 30 g  30 g Oral BID Shahmehdi, Seyed A, MD   30 g at 08/29/24 0949   magnesium  oxide (MAG-OX) tablet 400 mg  400 mg Oral Daily Shahmehdi, Seyed A, MD   400 mg at 08/29/24 0951   ondansetron  (ZOFRAN ) tablet 4 mg  4 mg Oral Q6H PRN Shahmehdi, Seyed A, MD       Or   ondansetron  (ZOFRAN ) injection 4 mg  4 mg Intravenous Q6H PRN Shahmehdi, Seyed A, MD       oxyCODONE  (Oxy IR/ROXICODONE ) immediate release tablet 5 mg  5 mg Oral Q4H PRN Shahmehdi, Seyed A, MD       senna-docusate (Senokot-S) tablet 1 tablet  1 tablet Oral QHS PRN Willette Adriana LABOR, MD  sodium chloride  flush (NS) 0.9 % injection 3 mL  3 mL Intravenous Q12H Shahmehdi, Seyed A, MD       spironolactone  (ALDACTONE ) tablet 50 mg  50 mg Oral Daily Shahmehdi, Seyed A, MD   50 mg at 08/29/24 0950   traZODone  (DESYREL ) tablet 25 mg  25 mg Oral QHS PRN Willette Adriana LABOR, MD       ursodiol  (ACTIGALL ) capsule 600 mg  600 mg Oral BID Willette Adriana A, MD   600 mg at 08/29/24 0950    Allergies as of 08/28/2024 - Review Complete 08/28/2024  Allergen Reaction Noted   Grapefruit concentrate Other (See Comments) 11/20/2016   Sulfa antibiotics Rash 04/29/2012   Amlodipine Other (See Comments) 11/02/2022   Crestor  [rosuvastatin ]  11/02/2022   Mushroom extract complex (obsolete)  02/21/2014   Shellfish allergy Hives 02/21/2014   Simvastatin   11/02/2022   Strawberry extract  02/21/2014   Metformin  and related Diarrhea 05/13/2023    Family History  Problem Relation Age of Onset   Breast cancer Mother    Schizophrenia Mother    Depression Mother    Uterine cancer Mother    Hypertension Mother    Obesity Mother    Renal Disease Father    Diabetes Father    Other Father        Agent Orange   Lung cancer Maternal Grandmother    Lung cancer Maternal Grandfather    Heart disease Maternal Uncle    Diabetes Other        Aunts and Uncles on both sides   Heart disease Maternal Aunt     Colon cancer Neg Hx    Esophageal cancer Neg Hx    Stomach cancer Neg Hx    Rectal cancer Neg Hx     Social History   Socioeconomic History   Marital status: Married    Spouse name: Dwayne   Number of children: 1   Years of education: Not on file   Highest education level: Some college, no degree  Occupational History   Occupation: Interior And Spatial Designer of HR for [employer]  Tobacco Use   Smoking status: Never   Smokeless tobacco: Never  Vaping Use   Vaping status: Never Used  Substance and Sexual Activity   Alcohol use: Yes    Comment: wine 2 times a year   Drug use: No   Sexual activity: Yes    Partners: Male    Birth control/protection: Surgical  Other Topics Concern   Not on file  Social History Narrative   Not on file   Social Drivers of Health   Financial Resource Strain: Low Risk  (08/17/2024)   Overall Financial Resource Strain (CARDIA)    Difficulty of Paying Living Expenses: Not very hard  Food Insecurity: No Food Insecurity (08/28/2024)   Hunger Vital Sign    Worried About Running Out of Food in the Last Year: Never true    Ran Out of Food in the Last Year: Never true  Transportation Needs: No Transportation Needs (08/28/2024)   PRAPARE - Administrator, Civil Service (Medical): No    Lack of Transportation (Non-Medical): No  Physical Activity: Insufficiently Active (08/17/2024)   Exercise Vital Sign    Days of Exercise per Week: 1 day    Minutes of Exercise per Session: 10 min  Stress: No Stress Concern Present (08/17/2024)   [employer] Occupational Health - Occupational Stress Questionnaire    Feeling of Stress: Only a little  Social Connections: Moderately Integrated (08/17/2024)   Social Connection and Isolation Panel    Frequency of Communication with Friends and Family: More than three times a week    Frequency of Social Gatherings with Friends and Family: Once a week    Attends Religious Services: More than 4 times per year     Active Member of [employer] or Organizations: No    Attends Engineer, Structural: Not on file    Marital Status: Married  Catering Manager Violence: Not At Risk (08/29/2024)   Humiliation, Afraid, Rape, and Kick questionnaire    Fear of Current or Ex-Partner: No    Emotionally Abused: No    Physically Abused: No    Sexually Abused: No    Review of Systems: Pertinent positive and negative review of systems were noted in the above HPI section.  All other review of systems was otherwise negative.   Physical Exam: Vital signs in last 24 hours: Temp:  [97.6 F (36.4 C)-98.7 F (37.1 C)] 98.2 F (36.8 C) (12/06 0815) Pulse Rate:  [69-75] 70 (12/06 0815) Resp:  [16-21] 16 (12/06 0815) BP: (104-145)/(54-79) 109/64 (12/06 0815) SpO2:  [97 %-100 %] 100 % (12/06 0815) Weight:  [79.2 kg-79.4 kg] 79.2 kg (12/06 0442) Last BM Date : 08/29/24 General:   Alert,  Well-developed, well-nourished, older African-American female pleasant and cooperative in NAD, up ambulating to the bathroom, conversant, speech appropriate Head:  Normocephalic and atraumatic. Eyes:  Sclera clear, no icterus.   Conjunctiva pink. Ears:  Normal auditory acuity. Nose:  No deformity, discharge,  or lesions. Mouth:  No deformity or lesions.   Neck:  Supple; no masses or thyromegaly. Lungs:  Clear throughout to auscultation.   No wheezes, crackles, or rhonchi . Heart:  Regular rate and rhythm; no murmurs, clicks, rubs,  or gallops. Abdomen:  Soft,nontender, BS active,nonpalp mass or hsm.,  No fluid wave Rectal: Not done Msk:  Symmetrical without gross deformities. . Pulses:  Normal pulses noted. Extremities:  Without clubbing or edema. Neurologic:  Alert and  oriented x4;  grossly normal neurologically.  No asterixis Skin:  Intact without significant lesions or rashes.. Psych:  Alert and cooperative. Normal mood and affect.  Intake/Output from previous day: 12/05 0701 - 12/06 0700 In: 450.7 [P.O.:175;  I.V.:275.7] Out: -  Intake/Output this shift: No intake/output data recorded.  Lab Results: Recent Labs    08/28/24 1054 08/28/24 1212  WBC 6.5  --   HGB 13.9 13.3  HCT 40.9 39.0  PLT 152  --    BMET Recent Labs    08/28/24 1054 08/28/24 1212 08/29/24 0553  NA 140 140 140  K 3.6 3.6 3.4*  CL 107  --  111  CO2 24  --  21*  GLUCOSE 111*  --  104*  BUN 14  --  13  CREATININE 1.18*  --  0.96  CALCIUM  10.0  --  8.3*   LFT Recent Labs    08/29/24 0553  PROT 5.7*  ALBUMIN 2.4*  AST 34  ALT 19  ALKPHOS 62  BILITOT 1.5*   PT/INR Recent Labs    08/28/24 1754 08/29/24 0553  LABPROT 17.4* 17.0*  INR 1.4* 1.3*     IMPRESSION:  #68 58 year old female with AMA negative primary biliary cirrhosis complicated by cirrhosis and with previous history of probable isolated gastric varix on EGD 2019.  Maintained on Actigall  On Coreg  for portal hypertension  Patient presented to the ER after her husband was concerned that she  had developed confusion, forgetfulness and increased fatigue over the previous 24 hours.  Workup in the ER as outlined above, primarily noted to have elevated LFTs/stable for her and elevated venous ammonia at 75. CT of the head was negative  Picture is consistent with development of hepatic encephalopathy. She has improved quickly on lactulose , and is about back to her baseline  No infection identified as trigger for encephalopathy, no evidence of GI bleeding. I suspect that increasing doses of Mounjaro  are playing a role in her symptoms secondary to side effect of gastroparesis and constipation which will slow clearing of ammonia from her system.  Plan; patient is to be discharged home today I discussed with patient and husband at bedside about discontinuing Mounjaro  for now.  She skipped her dose yesterday. At some point could consider resuming at lower dose, but for now I think best she stay off of it.  She is in agreement  Home on lactulose  30  cc twice daily.  We discussed the goal is to have a 2-3 bowel movements per day.  If she is not having 2-3 bowel movements per day she needs to increase the dose to 3 times daily.  She may benefit from Xifaxan but we can decide that outpatient.  She does have a follow-up with Dr. Leigh in our office in about a week, and will keep that appointment.  Up-to-date with ultrasound, will need AFP   Amy Esterwood PA-C 08/29/2024, 10:00 AM   Attending physician's note  I personally saw the patient and performed a substantive portion of the medical decision making process for this encounter (including a complete performance of the key components : MDM, Hx and Exam), in conjunction with the APP.  I agree with the APP's note, impression, and  the management plan for the number and complexity of problems addressed at the encounter for the patient and take responsibility for that plan with its inherent risk of complications, morbidity, or mortality with additional input as follows.    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 Leigh next week, may consider starting Xifaxan in addition to lactulose  to prevent recurrent episodes of severe hepatic encephalopathy especially in the setting of constipation due to Mounjaro   Okay to DC home today from GI standpoint  GI signing off, please call with any questions    The patient was provided an opportunity to ask questions and all were answered. The patient agreed with the plan  and demonstrated an understanding of the instructions.  LOIS Wilkie Mcgee , MD [phone]

## 2024-08-29 NOTE — Plan of Care (Signed)

## 2024-08-29 NOTE — Progress Notes (Signed)
 PT Cancellation Note  Patient Details Name: Susan [name] MRN: [id number] DOB: 1966/07/11   Cancelled Treatment:    Reason Eval/Treat Not Completed: PT screened, no needs identified, will sign off (Noted DC order. Per RN, pt has been ambulating independently. PT will sign off at this time.)   Nova Schmuhl 08/29/2024, 12:45 PM

## 2024-09-03 ENCOUNTER — Other Ambulatory Visit

## 2024-09-03 ENCOUNTER — Encounter: Payer: Self-pay | Admitting: Nurse Practitioner

## 2024-09-03 ENCOUNTER — Ambulatory Visit: Admitting: Nurse Practitioner

## 2024-09-03 VITALS — BP 122/78 | HR 69 | Ht 60.0 in | Wt 183.0 lb

## 2024-09-03 DIAGNOSIS — Z860109 Personal history of other colon polyps: Secondary | ICD-10-CM | POA: Diagnosis not present

## 2024-09-03 DIAGNOSIS — K5900 Constipation, unspecified: Secondary | ICD-10-CM

## 2024-09-03 DIAGNOSIS — K7682 Hepatic encephalopathy: Secondary | ICD-10-CM

## 2024-09-03 DIAGNOSIS — K7460 Unspecified cirrhosis of liver: Secondary | ICD-10-CM

## 2024-09-03 DIAGNOSIS — K743 Primary biliary cirrhosis: Secondary | ICD-10-CM

## 2024-09-03 LAB — COMPREHENSIVE METABOLIC PANEL WITH GFR
ALT: 19 U/L (ref 0–35)
AST: 33 U/L (ref 0–37)
Albumin: 3.4 g/dL — ABNORMAL LOW (ref 3.5–5.2)
Alkaline Phosphatase: 75 U/L (ref 39–117)
BUN: 14 mg/dL (ref 6–23)
CO2: 30 meq/L (ref 19–32)
Calcium: 9.6 mg/dL (ref 8.4–10.5)
Chloride: 103 meq/L (ref 96–112)
Creatinine, Ser: 0.94 mg/dL (ref 0.40–1.20)
GFR: 67.17 mL/min (ref >60.00)
Glucose, Bld: 148 mg/dL — ABNORMAL HIGH (ref 70–99)
Potassium: 3.8 meq/L (ref 3.5–5.1)
Sodium: 137 meq/L (ref 135–145)
Total Bilirubin: 1.3 mg/dL — ABNORMAL HIGH (ref 0.2–1.2)
Total Protein: 7 g/dL (ref 6.0–8.3)

## 2024-09-03 LAB — CBC
HCT: 38.6 % (ref 36.0–46.0)
Hemoglobin: 12.9 g/dL (ref 12.0–15.0)
MCHC: 33.4 g/dL (ref 30.0–36.0)
MCV: 90.3 fl (ref 78.0–100.0)
Platelets: 139 K/uL — ABNORMAL LOW (ref 150.0–400.0)
RBC: 4.27 Mil/uL (ref 3.87–5.11)
RDW: 14 % (ref 11.5–15.5)
WBC: 5.3 K/uL (ref 4.0–10.5)

## 2024-09-03 LAB — PROTIME-INR
INR: 1.4 ratio — ABNORMAL HIGH (ref 0.8–1.0)
Prothrombin Time: 14.4 s — ABNORMAL HIGH (ref 9.6–13.1)

## 2024-09-03 LAB — AMMONIA: Ammonia: 102 umol/L — ABNORMAL HIGH (ref 11–35)

## 2024-09-03 NOTE — Progress Notes (Signed)
 09/03/2024 KIAJA SHORTY [id number] [birth year]   Chief Complaint: Hospital follow up, primary biliary cholangitis  History of Present Illness: Susan [name] is a 58 year old female with a past medical history of hypertension, hyperlipidemia, diabetes mellitus type 2, AMA negative primary biliary cirrhosis with possible autoimmune hepatitis overlap, gastric varices and hepatic encephalopathy.  Discussed the use of AI scribe software for clinical note transcription with the patient, who gave verbal consent to proceed.  History of Present Illness Susan [name] is a 58 year old female who presents for follow-up regarding hepatic encephalopathy and constipation. She presented to the ED 08/28/2024 with altered mental status with fatigue and abnormal gait.  Head CT was negative.  WBC 6.5.  Hemoglobin 13.9.  Platelets 152.  Sodium 140.  Potassium 3.6.  BUN 14.  Creatinine 1.18.  Total bili 1.4.  AST 37.  ALT 22.  Alk phos 74.  Ammonia level 75.  MELD 3.0: 14.  She had been taking Mounjaro  for the past several months and lost a total of 40 pounds with an increase in dosage 1 month prior to admission.  She noted having increased nausea, decreased appetite and fatigue after Mounjaro  dose was increased.  She received lactulose  and her cognitive function improved.  She was discharged home 08/29/2024, prescribed Lactulose  30 grams twice daily but medication was not available when she went to the pharmacy.  Her last bowel movement was 2 days ago which was soft and light brown to yellow in color.  No bloody or black stools.  No chest pain or shortness of breath.   She has been experiencing ongoing memory issues and difficulty articulating her thoughts since her recent hospital discharge. She struggles to remember events from the previous week and occasionally has trouble finding the right words, requiring a few minutes to articulate her thoughts. She also experiences intermittent lightheadedness.  She  confirms she is currently driving and does not feel foggy-headed or unsafe to drive. She notes that last week she felt uncomfortable driving due to fogginess but has since rested and feels better.  She remains on Ursodiol  300 mg 2 capsules p.o. twice daily for PBC and Carvedilol  3.125 mg twice daily for portal hypertension.  She is also taking Hydrochlorothiazide  25 mg daily and Spironolactone  50 mg daily.  She typically denies any edema but noted mild swelling in her legs for the past few days.  She endorses adhering to a low-sodium diet.  No alcohol use.  Her most recent surveillance RUQ sonogram was 05/01/2024 which showed evidence of cirrhosis without hepatoma.  She stated she is scheduled to see [name] NP at [facility] 09/2024.  Last EGD 2019 with no esophageal varices, possible type I isolated gastric varices views limited due to angulated proximal stomach.  Colonoscopy at that same setting with a 3 mm polyp removed from the cecum 1 other diminutive polyp removed from the cecum internal hemorrhoids noted and hypertrophied anal papillae     Latest Ref Rng & Units 08/28/2024   12:12 PM 08/28/2024   10:54 AM 11/02/2022   11:31 AM  CBC  WBC 4.0 - 10.5 K/uL  6.5  6.6   Hemoglobin 12.0 - 15.0 g/dL 86.6  86.0  86.8   Hematocrit 36.0 - 46.0 % 39.0  40.9  39.1   Platelets 150 - 400 K/uL  152  170.0        Latest Ref Rng & Units 08/29/2024    5:53 AM  08/28/2024   12:12 PM 08/28/2024   10:54 AM  CMP  Glucose 70 - 99 mg/dL 895   888   BUN 6 - 20 mg/dL 13   14   Creatinine 9.55 - 1.00 mg/dL 9.03   8.81   Sodium 864 - 145 mmol/L 140  140  140   Potassium 3.5 - 5.1 mmol/L 3.4  3.6  3.6   Chloride 98 - 111 mmol/L 111   107   CO2 22 - 32 mmol/L 21   24   Calcium  8.9 - 10.3 mg/dL 8.3   89.9   Total Protein 6.5 - 8.1 g/dL 5.7   7.3   Total Bilirubin 0.0 - 1.2 mg/dL 1.5   1.4   Alkaline Phos 38 - 126 U/L 62   74   AST 15 - 41 U/L 34   37   ALT 0 - 44 U/L 19   22     AFP 5.9 on  05/01/2024  MELD 3.0: 14 at 08/29/2024  5:53 AM MELD-Na: 11 at 08/29/2024  5:53 AM Calculated from: Serum Creatinine: 0.96 mg/dL (Using min of 1 mg/dL) at 87/11/7972  4:46 AM Serum Sodium: 140 mmol/L (Using max of 137 mmol/L) at 08/29/2024  5:53 AM Total Bilirubin: 1.5 mg/dL at 87/11/7972  4:46 AM Serum Albumin: 2.4 g/dL at 87/11/7972  4:46 AM INR(ratio): 1.3 at 08/29/2024  5:53 AM Age at listing (hypothetical): 58 years Sex: Female at 08/29/2024  5:53 AM  RUQ sonogram 05/01/2024:  FINDINGS: Gallbladder:   Contracted gallbladder containing a single small calculus. No sonographic Murphy's sign.   Common bile duct:   Diameter: 3 mm   Liver:   Stable cirrhotic morphology of the liver with heterogeneous parenchyma and nodular surface contour. No focal hepatic mass identified by ultrasound. No evidence of intrahepatic biliary ductal dilatation. Portal vein is patent on color Doppler imaging with normal direction of blood flow towards the liver.   Other: No ascites identified in the right upper quadrant.   IMPRESSION: 1. Stable cirrhotic morphology of the liver. No focal hepatic mass identified by ultrasound. 2. Contracted gallbladder containing a single small calculus.     GI PROCEDURES:  EGD 07/31/18  - The exam of the esophagus was otherwise normal. No esophageal varices. - Possible type 1 isolated gastric varices (IGV1, varices located in the fundus) were found in the cardia / fundus. The fundus was angulated. Despite the endoscope maximally retroflexed in multiple angles, the entire cardia could not be well visualized. - The exam of the stomach was otherwise normal. - The duodenal bulb and second portion of the duodenum were normal.    Colonoscopy 07/31/18  - The perianal and digital rectal examinations were normal. - A 3 mm polyp was found in the cecum. The polyp was sessile. The polyp was removed with a cold snare. Resection and retrieval were complete. - A diminutive polyp  was found in the cecum. The polyp was sessile. It was not able to be grasped with the snare. The polyp was removed with a cold biopsy forceps. Resection and retrieval were complete. - Anal papilla(e) were hypertrophied. - Internal hemorrhoids were found during retroflexion, with a suspected rectal varix. - There were prominent lymphoid aggregates in the right colon. The exam was otherwise without abnormality.  Inflammatory polyps, no adenomas    Medications Ordered Prior to Encounter[1]  Allergies[2]  Current Medications, Allergies, Past Medical History, Past Surgical History, Family History and Social History were reviewed in [employer]  record.  Review of Systems:   Constitutional: Negative for fever, sweats, chills or weight loss.  Respiratory: Negative for shortness of breath.   Cardiovascular: Negative for chest pain, palpitations and leg swelling.  Gastrointestinal: See HPI.  Musculoskeletal: Negative for back pain or muscle aches.  Neurological: Negative for dizziness, headaches or paresthesias.   Physical Exam: BP 122/78   Pulse 69   Ht 5' (1.524 m)   Wt 183 lb (83 kg)   BMI 35.74 kg/m  Wt Readings from Last 3 Encounters:  09/03/24 183 lb (83 kg)  08/29/24 174 lb 9.7 oz (79.2 kg)  08/17/24 178 lb 12.8 oz (81.1 kg)    General: 58-year female in no acute distress. Head: Normocephalic and atraumatic. Eyes: No scleral icterus. Conjunctiva pink . Ears: Normal auditory acuity. Mouth: Dentition intact. No ulcers or lesions.  Lungs: Clear throughout to auscultation. Heart: Regular rate and rhythm.  2/6 systolic murmur. Abdomen: Soft, nontender and nondistended. No masses or hepatomegaly. Normal bowel sounds x 4 quadrants.  Rectal: Deferred Musculoskeletal: Symmetrical with no gross deformities. Extremities: Mild bilateral lower extremity edema. Neurological: Alert oriented x 4. No focal deficits.  No asterixis. Psychological: Alert and  cooperative. Normal mood and affect  Assessment and Recommendations:  58 year old female with AMA negative primary biliary cirrhosis with possible autoimmune hepatitis overlap, gastric varices and hepatic encephalopathy. Brief hospital admission 12/5 - 08/29/2024 with fatigue and hepatic encephalopathy.  She received lactulose  and hepatic encephalopathy significantly improved therefore she was discharged home on lactulose  30 g p.o. twice daily, however, the patient stated the pharmacy did not have this medication available and has not taken it since discharged home.  - Our staff contacted the patient's pharmacy and confirmed prior Rx for lactulose  30 g p.o. twice daily was previously received at time of discharge, pharmacist stated this prescription would be ready at 3 PM today for patient to pick up. - Patient instructed to start lactulose  30 g p.o. twice daily today, titrate to no more than 2-3 bowel movements daily.  If she has less than 2 bowel movements daily to increase lactulose  to 3 times daily. - Patient will require Xifaxan if she has any further episodes of hepatic encephalopathy - CBC, CMP, ammonia level and PT/INR - Continue low-sodium diet - Continue ursodiol  300 mg 2 capsules p.o. twice daily - Continue carvedilol  3.125 mg p.o. twice daily - Continue spironolactone  50 mg daily and HCTZ 25 mg once daily - No NSAIDs - Patient does not require EGD to survey for esophageal/gastric varices as she is on carvedilol  - Patient to follow-up with [name] NP at [facility] 09/2024 - Continue to hold Monjaro for now, if restarted, will require lower dose  Constipation  - Lactulose  as ordered above, will also treat constipation - Patient to contact office if constipation persists or worsens  Colon cancer screening.  Colonoscopy 07/2018 identified 2 benign inflammatory polyps, no adenoma - Next colonoscopy due 07/2028       [1]  Current Outpatient Medications on File  Prior to Visit  Medication Sig Dispense Refill   carvedilol  (COREG ) 3.125 MG tablet Take 3.125 mg by mouth 2 (two) times daily.     hydrochlorothiazide  (HYDRODIURIL ) 25 MG tablet TAKE 1 TABLET BY MOUTH DAILY 90 tablet 3   lactulose  (CHRONULAC ) 10 GM/15ML solution Take 45 mLs (30 g total) by mouth 2 (two) times daily. 2700 mL 1   magnesium  oxide (MAG-OX) 400 (240 Mg) MG tablet Take 1 tablet (400  mg total) by mouth 2 (two) times daily. 60 tablet 0   spironolactone  (ALDACTONE ) 50 MG tablet Take 1 tablet (50 mg total) by mouth daily. 90 tablet 0   ursodiol  (ACTIGALL ) 300 MG capsule TAKE 2 CAPSULES BY MOUTH TWICE  DAILY 360 capsule 3   No current facility-administered medications on file prior to visit.  [2]  Allergies Allergen Reactions   Grapefruit Concentrate Other (See Comments)    blisters   Mounjaro  [Tirzepatide ] Other (See Comments)    Hepatic encephalopathy   Sulfa Antibiotics Rash    Severe dehydration   Amlodipine Other (See Comments)    Heart rate fast   Crestor  [Rosuvastatin ]     Brain fog, fatigue, felt 'terrible'.    Mushroom Extract Complex (Obsolete)     Throat swells   Shellfish Allergy Hives        Simvastatin      Brain fog, fatigue, felt 'terrible'.    Strawberry Extract     hives   Metformin  And Related Diarrhea    Weakness

## 2024-09-03 NOTE — Patient Instructions (Addendum)
 _______________________________________________________  If your blood pressure at your visit was 140/90 or greater, please contact your primary care physician to follow up on this.  _______________________________________________________  If you are age 57 or older, your body mass index should be between 23-30. Your Body mass index is 35.74 kg/m. If this is out of the aforementioned range listed, please consider follow up with your Primary Care Provider.  If you are age 70 or younger, your body mass index should be between 19-25. Your Body mass index is 35.74 kg/m. If this is out of the aformentioned range listed, please consider follow up with your Primary Care Provider.   ________________________________________________________  The [facility] providers would like to encourage you to use MYCHART to communicate with providers for non-urgent requests or questions.  Due to long hold times on the telephone, sending your provider a message by [facility] may be a faster and more efficient way to get a response.  Please allow 48 business hours for a response.  Please remember that this is for non-urgent requests.  _______________________________________________________  [facility] is using a team-based approach to care.  Your team is made up of your doctor and two to three APPS. Our APPS (Nurse Practitioners and Physician Assistants) work with your physician to ensure care continuity for you. They are fully qualified to address your health concerns and develop a treatment plan. They communicate directly with your gastroenterologist to care for you. Seeing the Advanced Practice Practitioners on your physician's team can help you by facilitating care more promptly, often allowing for earlier appointments, access to diagnostic testing, procedures, and other specialty referrals.   Your provider has requested that you go to the basement level for lab work before leaving today. Press B on the  elevator. The lab is located at the first door on the left as you exit the elevator.  With your lactulose  titrate to no more than 2-3 bowel movements a day. If you are having less than 2-3 bowel movements then increase lactulose  to 3 times a day  Thank you for trusting me with your gastrointestinal care!   Elida Shawl, CRNP

## 2024-09-03 NOTE — Progress Notes (Signed)
 Agree with assessment and plan as outlined.

## 2024-09-04 ENCOUNTER — Ambulatory Visit: Payer: Self-pay | Admitting: Nurse Practitioner

## 2024-09-07 ENCOUNTER — Other Ambulatory Visit: Payer: Self-pay | Admitting: *Deleted

## 2024-09-07 ENCOUNTER — Encounter

## 2024-09-07 DIAGNOSIS — Z1231 Encounter for screening mammogram for malignant neoplasm of breast: Secondary | ICD-10-CM

## 2024-09-07 DIAGNOSIS — R600 Localized edema: Secondary | ICD-10-CM

## 2024-09-07 MED ORDER — SPIRONOLACTONE 50 MG PO TABS: 50.0000 mg | ORAL_TABLET | Freq: Every day | ORAL | 0 refills | Status: DC

## 2024-09-08 NOTE — Progress Notes (Signed)
 Spoke to patient to advise that Dr Leigh does NOT recommend she restart Monjauro. Patient verbalizes understanding of this information.

## 2024-09-08 NOTE — Progress Notes (Signed)
 Linda/Dottie:  Pls contact patient and let her know that Dr. Leigh does NOT recommend restarting Mounjaro . THX>

## 2024-09-10 ENCOUNTER — Inpatient Hospital Stay: Admission: RE | Admit: 2024-09-10 | Discharge: 2024-09-10

## 2024-09-10 DIAGNOSIS — Z1231 Encounter for screening mammogram for malignant neoplasm of breast: Secondary | ICD-10-CM

## 2024-09-15 ENCOUNTER — Ambulatory Visit: Payer: Self-pay | Admitting: Family

## 2024-10-07 ENCOUNTER — Other Ambulatory Visit: Payer: Self-pay | Admitting: Nurse Practitioner

## 2024-10-07 DIAGNOSIS — K7682 Hepatic encephalopathy: Secondary | ICD-10-CM

## 2024-10-07 DIAGNOSIS — K7469 Other cirrhosis of liver: Secondary | ICD-10-CM

## 2024-10-07 DIAGNOSIS — K743 Primary biliary cirrhosis: Secondary | ICD-10-CM

## 2024-10-07 DIAGNOSIS — I864 Gastric varices: Secondary | ICD-10-CM

## 2024-10-09 ENCOUNTER — Encounter (HOSPITAL_COMMUNITY): Payer: Self-pay | Admitting: Emergency Medicine

## 2024-10-09 ENCOUNTER — Inpatient Hospital Stay (HOSPITAL_COMMUNITY)
Admission: EM | Admit: 2024-10-09 | Discharge: 2024-10-22 | DRG: 405 | Disposition: A | Discharge status: Home Health | Attending: Family Medicine | Admitting: Family Medicine

## 2024-10-09 ENCOUNTER — Other Ambulatory Visit: Payer: Self-pay

## 2024-10-09 DIAGNOSIS — K766 Portal hypertension: Secondary | ICD-10-CM | POA: Diagnosis present

## 2024-10-09 DIAGNOSIS — K219 Gastro-esophageal reflux disease without esophagitis: Secondary | ICD-10-CM | POA: Diagnosis present

## 2024-10-09 DIAGNOSIS — Z79899 Other long term (current) drug therapy: Secondary | ICD-10-CM

## 2024-10-09 DIAGNOSIS — N179 Acute kidney failure, unspecified: Secondary | ICD-10-CM | POA: Diagnosis present

## 2024-10-09 DIAGNOSIS — Y838 Other surgical procedures as the cause of abnormal reaction of the patient, or of later complication, without mention of misadventure at the time of the procedure: Secondary | ICD-10-CM | POA: Diagnosis not present

## 2024-10-09 DIAGNOSIS — K7460 Unspecified cirrhosis of liver: Secondary | ICD-10-CM

## 2024-10-09 DIAGNOSIS — T508X5A Adverse effect of diagnostic agents, initial encounter: Secondary | ICD-10-CM | POA: Diagnosis not present

## 2024-10-09 DIAGNOSIS — Z803 Family history of malignant neoplasm of breast: Secondary | ICD-10-CM

## 2024-10-09 DIAGNOSIS — I10 Essential (primary) hypertension: Secondary | ICD-10-CM | POA: Diagnosis present

## 2024-10-09 DIAGNOSIS — Z8249 Family history of ischemic heart disease and other diseases of the circulatory system: Secondary | ICD-10-CM

## 2024-10-09 DIAGNOSIS — I81 Portal vein thrombosis: Secondary | ICD-10-CM | POA: Diagnosis present

## 2024-10-09 DIAGNOSIS — T82868A Thrombosis of vascular prosthetic devices, implants and grafts, initial encounter: Secondary | ICD-10-CM | POA: Diagnosis not present

## 2024-10-09 DIAGNOSIS — I864 Gastric varices: Secondary | ICD-10-CM | POA: Diagnosis present

## 2024-10-09 DIAGNOSIS — E119 Type 2 diabetes mellitus without complications: Secondary | ICD-10-CM | POA: Diagnosis present

## 2024-10-09 DIAGNOSIS — E8809 Other disorders of plasma-protein metabolism, not elsewhere classified: Secondary | ICD-10-CM | POA: Diagnosis not present

## 2024-10-09 DIAGNOSIS — N1411 Contrast-induced nephropathy: Secondary | ICD-10-CM | POA: Diagnosis not present

## 2024-10-09 DIAGNOSIS — K743 Primary biliary cirrhosis: Principal | ICD-10-CM | POA: Diagnosis present

## 2024-10-09 DIAGNOSIS — Z882 Allergy status to sulfonamides status: Secondary | ICD-10-CM

## 2024-10-09 DIAGNOSIS — K920 Hematemesis: Principal | ICD-10-CM | POA: Diagnosis present

## 2024-10-09 DIAGNOSIS — Z888 Allergy status to other drugs, medicaments and biological substances status: Secondary | ICD-10-CM

## 2024-10-09 DIAGNOSIS — E8721 Acute metabolic acidosis: Secondary | ICD-10-CM | POA: Diagnosis present

## 2024-10-09 DIAGNOSIS — E871 Hypo-osmolality and hyponatremia: Secondary | ICD-10-CM | POA: Diagnosis not present

## 2024-10-09 DIAGNOSIS — Z8049 Family history of malignant neoplasm of other genital organs: Secondary | ICD-10-CM

## 2024-10-09 DIAGNOSIS — Z6841 Body Mass Index (BMI) 40.0 and over, adult: Secondary | ICD-10-CM

## 2024-10-09 DIAGNOSIS — K7682 Hepatic encephalopathy: Secondary | ICD-10-CM | POA: Diagnosis present

## 2024-10-09 DIAGNOSIS — Z8419 Family history of other disorders of kidney and ureter: Secondary | ICD-10-CM

## 2024-10-09 DIAGNOSIS — Z833 Family history of diabetes mellitus: Secondary | ICD-10-CM

## 2024-10-09 DIAGNOSIS — R188 Other ascites: Secondary | ICD-10-CM | POA: Diagnosis present

## 2024-10-09 DIAGNOSIS — K922 Gastrointestinal hemorrhage, unspecified: Secondary | ICD-10-CM | POA: Diagnosis present

## 2024-10-09 DIAGNOSIS — E876 Hypokalemia: Secondary | ICD-10-CM | POA: Diagnosis not present

## 2024-10-09 DIAGNOSIS — D696 Thrombocytopenia, unspecified: Secondary | ICD-10-CM | POA: Diagnosis not present

## 2024-10-09 DIAGNOSIS — Z91018 Allergy to other foods: Secondary | ICD-10-CM

## 2024-10-09 DIAGNOSIS — K8020 Calculus of gallbladder without cholecystitis without obstruction: Secondary | ICD-10-CM | POA: Diagnosis present

## 2024-10-09 DIAGNOSIS — D62 Acute posthemorrhagic anemia: Secondary | ICD-10-CM | POA: Diagnosis present

## 2024-10-09 DIAGNOSIS — Z818 Family history of other mental and behavioral disorders: Secondary | ICD-10-CM

## 2024-10-09 DIAGNOSIS — E785 Hyperlipidemia, unspecified: Secondary | ICD-10-CM | POA: Diagnosis present

## 2024-10-09 DIAGNOSIS — R578 Other shock: Secondary | ICD-10-CM | POA: Diagnosis present

## 2024-10-09 DIAGNOSIS — D72829 Elevated white blood cell count, unspecified: Secondary | ICD-10-CM | POA: Diagnosis present

## 2024-10-09 DIAGNOSIS — E66813 Obesity, class 3: Secondary | ICD-10-CM | POA: Diagnosis present

## 2024-10-09 DIAGNOSIS — Z95828 Presence of other vascular implants and grafts: Secondary | ICD-10-CM

## 2024-10-09 DIAGNOSIS — Y832 Surgical operation with anastomosis, bypass or graft as the cause of abnormal reaction of the patient, or of later complication, without mention of misadventure at the time of the procedure: Secondary | ICD-10-CM | POA: Diagnosis not present

## 2024-10-09 DIAGNOSIS — K648 Other hemorrhoids: Secondary | ICD-10-CM | POA: Diagnosis present

## 2024-10-09 DIAGNOSIS — R571 Hypovolemic shock: Secondary | ICD-10-CM | POA: Diagnosis present

## 2024-10-09 DIAGNOSIS — Z91013 Allergy to seafood: Secondary | ICD-10-CM

## 2024-10-09 DIAGNOSIS — Z5971 Insufficient health insurance coverage: Secondary | ICD-10-CM

## 2024-10-09 DIAGNOSIS — E8770 Fluid overload, unspecified: Secondary | ICD-10-CM | POA: Diagnosis present

## 2024-10-09 DIAGNOSIS — N289 Disorder of kidney and ureter, unspecified: Secondary | ICD-10-CM

## 2024-10-09 DIAGNOSIS — E861 Hypovolemia: Secondary | ICD-10-CM | POA: Diagnosis not present

## 2024-10-09 DIAGNOSIS — Z801 Family history of malignant neoplasm of trachea, bronchus and lung: Secondary | ICD-10-CM

## 2024-10-09 DIAGNOSIS — R1030 Lower abdominal pain, unspecified: Secondary | ICD-10-CM

## 2024-10-09 NOTE — ED Triage Notes (Signed)
 Patient coming to ED for evaluation of altered mental status and vomiting blood.  Visitor reports that they went out to eat dinner at a [employer] for dinner.  Had clear broth soup.  Pt is allergic to mushrooms and thinks that the soup might have had mushrooms in it.    After dinner, patient began vomiting blood.  Visitor was in the shower and heard a noise.  Found patient slumped over and not responding with foam in her mouth.  Has had multiple episodes of emesis with blood noted per visitor.  While in triage, vomited large amount of dark red emesis.  Pt c/o weakness and slow to respond to questions.

## 2024-10-10 ENCOUNTER — Inpatient Hospital Stay (HOSPITAL_COMMUNITY): Admitting: Anesthesiology

## 2024-10-10 ENCOUNTER — Encounter (HOSPITAL_COMMUNITY)
Admission: EM | Disposition: A | Discharge status: Home Health | Payer: Self-pay | Source: Home / Self Care | Attending: Internal Medicine

## 2024-10-10 ENCOUNTER — Emergency Department (HOSPITAL_COMMUNITY)

## 2024-10-10 ENCOUNTER — Encounter (HOSPITAL_COMMUNITY): Payer: Self-pay

## 2024-10-10 ENCOUNTER — Inpatient Hospital Stay (HOSPITAL_COMMUNITY)

## 2024-10-10 DIAGNOSIS — I959 Hypotension, unspecified: Secondary | ICD-10-CM | POA: Diagnosis not present

## 2024-10-10 DIAGNOSIS — E119 Type 2 diabetes mellitus without complications: Secondary | ICD-10-CM

## 2024-10-10 DIAGNOSIS — K922 Gastrointestinal hemorrhage, unspecified: Secondary | ICD-10-CM | POA: Diagnosis present

## 2024-10-10 DIAGNOSIS — R578 Other shock: Secondary | ICD-10-CM

## 2024-10-10 DIAGNOSIS — K7460 Unspecified cirrhosis of liver: Secondary | ICD-10-CM | POA: Diagnosis not present

## 2024-10-10 DIAGNOSIS — E785 Hyperlipidemia, unspecified: Secondary | ICD-10-CM | POA: Diagnosis not present

## 2024-10-10 DIAGNOSIS — K766 Portal hypertension: Secondary | ICD-10-CM

## 2024-10-10 DIAGNOSIS — I10 Essential (primary) hypertension: Secondary | ICD-10-CM | POA: Diagnosis not present

## 2024-10-10 DIAGNOSIS — K920 Hematemesis: Principal | ICD-10-CM

## 2024-10-10 DIAGNOSIS — E7220 Disorder of urea cycle metabolism, unspecified: Secondary | ICD-10-CM | POA: Diagnosis not present

## 2024-10-10 DIAGNOSIS — J9601 Acute respiratory failure with hypoxia: Secondary | ICD-10-CM | POA: Diagnosis not present

## 2024-10-10 HISTORY — PX: ESOPHAGOGASTRODUODENOSCOPY: SHX5428

## 2024-10-10 HISTORY — PX: IR US GUIDE VASC ACCESS RIGHT: IMG2390

## 2024-10-10 HISTORY — PX: IR INTRAVASCULAR ULTRASOUND NON CORONARY: IMG6085

## 2024-10-10 HISTORY — PX: RADIOLOGY WITH ANESTHESIA: SHX6223

## 2024-10-10 HISTORY — PX: IR EMBO VENOUS NOT HEMORR HEMANG  INC GUIDE ROADMAPPING: IMG5447

## 2024-10-10 HISTORY — PX: IR TIPS: IMG2295

## 2024-10-10 LAB — BASIC METABOLIC PANEL WITH GFR
Anion gap: 9 (ref 5–15)
Anion gap: 10 (ref 5–15)
BUN: 33 mg/dL — ABNORMAL HIGH (ref 6–20)
BUN: 48 mg/dL — ABNORMAL HIGH (ref 6–20)
CO2: 22 mmol/L (ref 22–32)
CO2: 20 mmol/L — ABNORMAL LOW (ref 22–32)
Calcium: 8.4 mg/dL — ABNORMAL LOW (ref 8.9–10.3)
Calcium: 8.1 mg/dL — ABNORMAL LOW (ref 8.9–10.3)
Chloride: 106 mmol/L (ref 98–111)
Chloride: 106 mmol/L (ref 98–111)
Creatinine, Ser: 1.1 mg/dL — ABNORMAL HIGH (ref 0.44–1.00)
Creatinine, Ser: 1.41 mg/dL — ABNORMAL HIGH (ref 0.44–1.00)
GFR, Estimated: 58 mL/min — ABNORMAL LOW
GFR, Estimated: 43 mL/min — ABNORMAL LOW
Glucose, Bld: 191 mg/dL — ABNORMAL HIGH (ref 70–99)
Glucose, Bld: 223 mg/dL — ABNORMAL HIGH (ref 70–99)
Potassium: 4.9 mmol/L (ref 3.5–5.1)
Potassium: 4.9 mmol/L (ref 3.5–5.1)
Sodium: 137 mmol/L (ref 135–145)
Sodium: 136 mmol/L (ref 135–145)

## 2024-10-10 LAB — CBC WITH DIFFERENTIAL/PLATELET
Abs Immature Granulocytes: 0.06 K/uL (ref 0.00–0.07)
Abs Immature Granulocytes: 0.38 K/uL — ABNORMAL HIGH (ref 0.00–0.07)
Basophils Absolute: 0 K/uL (ref 0.0–0.1)
Basophils Absolute: 0.1 K/uL (ref 0.0–0.1)
Basophils Relative: 0 %
Basophils Relative: 0 %
Eosinophils Absolute: 0 K/uL (ref 0.0–0.5)
Eosinophils Absolute: 0 K/uL (ref 0.0–0.5)
Eosinophils Relative: 0 %
Eosinophils Relative: 0 %
HCT: 31.1 % — ABNORMAL LOW (ref 36.0–46.0)
HCT: 28.9 % — ABNORMAL LOW (ref 36.0–46.0)
Hemoglobin: 10.6 g/dL — ABNORMAL LOW (ref 12.0–15.0)
Hemoglobin: 10 g/dL — ABNORMAL LOW (ref 12.0–15.0)
Immature Granulocytes: 1 %
Immature Granulocytes: 2 %
Lymphocytes Relative: 16 %
Lymphocytes Relative: 16 %
Lymphs Abs: 1.6 K/uL (ref 0.7–4.0)
Lymphs Abs: 3.5 K/uL (ref 0.7–4.0)
MCH: 31.5 pg (ref 26.0–34.0)
MCH: 31.8 pg (ref 26.0–34.0)
MCHC: 34.1 g/dL (ref 30.0–36.0)
MCHC: 34.6 g/dL (ref 30.0–36.0)
MCV: 92.6 fL (ref 80.0–100.0)
MCV: 92 fL (ref 80.0–100.0)
Monocytes Absolute: 0.5 K/uL (ref 0.1–1.0)
Monocytes Absolute: 2.1 K/uL — ABNORMAL HIGH (ref 0.1–1.0)
Monocytes Relative: 5 %
Monocytes Relative: 9 %
Neutro Abs: 7.8 K/uL — ABNORMAL HIGH (ref 1.7–7.7)
Neutro Abs: 16.8 K/uL — ABNORMAL HIGH (ref 1.7–7.7)
Neutrophils Relative %: 78 %
Neutrophils Relative %: 73 %
Platelets: 205 K/uL (ref 150–400)
Platelets: 224 K/uL (ref 150–400)
RBC: 3.36 MIL/uL — ABNORMAL LOW (ref 3.87–5.11)
RBC: 3.14 MIL/uL — ABNORMAL LOW (ref 3.87–5.11)
RDW: 13.9 % (ref 11.5–15.5)
RDW: 14.3 % (ref 11.5–15.5)
WBC: 10 K/uL (ref 4.0–10.5)
WBC: 22.9 K/uL — ABNORMAL HIGH (ref 4.0–10.5)
nRBC: 0 % (ref 0.0–0.2)
nRBC: 0.1 % (ref 0.0–0.2)

## 2024-10-10 LAB — CBC
HCT: 27.7 % — ABNORMAL LOW (ref 36.0–46.0)
HCT: 32.3 % — ABNORMAL LOW (ref 36.0–46.0)
Hemoglobin: 10.7 g/dL — ABNORMAL LOW (ref 12.0–15.0)
Hemoglobin: 9.2 g/dL — ABNORMAL LOW (ref 12.0–15.0)
MCH: 30.9 pg (ref 26.0–34.0)
MCH: 31.3 pg (ref 26.0–34.0)
MCHC: 33.1 g/dL (ref 30.0–36.0)
MCHC: 33.2 g/dL (ref 30.0–36.0)
MCV: 93.4 fL (ref 80.0–100.0)
MCV: 94.2 fL (ref 80.0–100.0)
Platelets: 207 K/uL (ref 150–400)
Platelets: 240 K/uL (ref 150–400)
RBC: 2.94 MIL/uL — ABNORMAL LOW (ref 3.87–5.11)
RBC: 3.46 MIL/uL — ABNORMAL LOW (ref 3.87–5.11)
RDW: 13.2 % (ref 11.5–15.5)
RDW: 13.2 % (ref 11.5–15.5)
WBC: 10.2 K/uL (ref 4.0–10.5)
WBC: 12.1 K/uL — ABNORMAL HIGH (ref 4.0–10.5)
nRBC: 0 % (ref 0.0–0.2)
nRBC: 0 % (ref 0.0–0.2)

## 2024-10-10 LAB — POCT I-STAT 7, (LYTES, BLD GAS, ICA,H+H)
Acid-base deficit: 3 mmol/L — ABNORMAL HIGH (ref 0.0–2.0)
Bicarbonate: 22.7 mmol/L (ref 20.0–28.0)
Calcium, Ion: 1.15 mmol/L (ref 1.15–1.40)
HCT: 29 % — ABNORMAL LOW (ref 36.0–46.0)
Hemoglobin: 9.9 g/dL — ABNORMAL LOW (ref 12.0–15.0)
O2 Saturation: 99 %
Patient temperature: 99.1
Potassium: 5.5 mmol/L — ABNORMAL HIGH (ref 3.5–5.1)
Sodium: 138 mmol/L (ref 135–145)
TCO2: 24 mmol/L (ref 22–32)
pCO2 arterial: 43.7 mmHg (ref 32–48)
pH, Arterial: 7.324 — ABNORMAL LOW (ref 7.35–7.45)
pO2, Arterial: 143 mmHg — ABNORMAL HIGH (ref 83–108)

## 2024-10-10 LAB — COMPREHENSIVE METABOLIC PANEL WITH GFR
ALT: 20 U/L (ref 0–44)
AST: 40 U/L (ref 15–41)
Albumin: 3 g/dL — ABNORMAL LOW (ref 3.5–5.0)
Alkaline Phosphatase: 124 U/L (ref 38–126)
Anion gap: 10 (ref 5–15)
BUN: 24 mg/dL — ABNORMAL HIGH (ref 6–20)
CO2: 25 mmol/L (ref 22–32)
Calcium: 9.3 mg/dL (ref 8.9–10.3)
Chloride: 102 mmol/L (ref 98–111)
Creatinine, Ser: 1 mg/dL (ref 0.44–1.00)
GFR, Estimated: >60 mL/min
Glucose, Bld: 190 mg/dL — ABNORMAL HIGH (ref 70–99)
Potassium: 4.7 mmol/L (ref 3.5–5.1)
Sodium: 137 mmol/L (ref 135–145)
Total Bilirubin: 1.6 mg/dL — ABNORMAL HIGH (ref 0.0–1.2)
Total Protein: 6.5 g/dL (ref 6.5–8.1)

## 2024-10-10 LAB — ABO/RH: ABO/RH(D): A POS

## 2024-10-10 LAB — PREPARE RBC (CROSSMATCH)

## 2024-10-10 LAB — CBG MONITORING, ED: Glucose-Capillary: 165 mg/dL — ABNORMAL HIGH (ref 70–99)

## 2024-10-10 LAB — APTT: aPTT: 24 s (ref 24–36)

## 2024-10-10 LAB — AMMONIA: Ammonia: 113 umol/L — ABNORMAL HIGH (ref 9–35)

## 2024-10-10 LAB — GLUCOSE, CAPILLARY
Glucose-Capillary: 170 mg/dL — ABNORMAL HIGH (ref 70–99)
Glucose-Capillary: 169 mg/dL — ABNORMAL HIGH (ref 70–99)
Glucose-Capillary: 207 mg/dL — ABNORMAL HIGH (ref 70–99)
Glucose-Capillary: 226 mg/dL — ABNORMAL HIGH (ref 70–99)

## 2024-10-10 LAB — PROTIME-INR
INR: 1.1 (ref 0.8–1.2)
Prothrombin Time: 15.1 s (ref 11.4–15.2)

## 2024-10-10 LAB — LIPASE, BLOOD: Lipase: 84 U/L — ABNORMAL HIGH (ref 11–51)

## 2024-10-10 LAB — MRSA NEXT GEN BY PCR, NASAL: MRSA by PCR Next Gen: NOT DETECTED

## 2024-10-10 SURGERY — RADIOLOGY WITH ANESTHESIA
Anesthesia: General

## 2024-10-10 MED ORDER — PHENYLEPHRINE 80 MCG/ML (10ML) SYRINGE FOR IV PUSH (FOR BLOOD PRESSURE SUPPORT)
PREFILLED_SYRINGE | INTRAVENOUS | Status: DC | PRN
  Administered 2024-10-10 (×5): 160 ug via INTRAVENOUS

## 2024-10-10 MED ORDER — PROPOFOL 1000 MG/100ML IV EMUL
0.0000 ug/kg/min | INTRAVENOUS | Status: DC
  Administered 2024-10-10 (×2): 50 ug/kg/min via INTRAVENOUS
  Administered 2024-10-10: 70 ug/kg/min via INTRAVENOUS
  Administered 2024-10-10: 50 ug/kg/min via INTRAVENOUS
  Filled 2024-10-10 (×4): qty 100

## 2024-10-10 MED ORDER — LIPIODOL ULTRAFLUID INJECTION
10.0000 mL | Freq: Once | INTRAMUSCULAR | Status: AC
  Administered 2024-10-10: 4 mL

## 2024-10-10 MED ORDER — SODIUM CHLORIDE 0.9 % IV BOLUS
1000.0000 mL | Freq: Once | INTRAVENOUS | Status: AC
  Administered 2024-10-10: 1000 mL via INTRAVENOUS

## 2024-10-10 MED ORDER — SUCCINYLCHOLINE CHLORIDE 200 MG/10ML IV SOSY
PREFILLED_SYRINGE | INTRAVENOUS | Status: DC | PRN
  Administered 2024-10-10: 120 mg via INTRAVENOUS

## 2024-10-10 MED ORDER — SENNA 8.6 MG PO TABS
1.0000 | ORAL_TABLET | Freq: Two times a day (BID) | ORAL | Status: DC
  Administered 2024-10-11: 8.6 mg
  Filled 2024-10-10: qty 1

## 2024-10-10 MED ORDER — NOREPINEPHRINE 4 MG/250ML-% IV SOLN
0.0000 ug/min | INTRAVENOUS | Status: DC
  Administered 2024-10-10: 4 ug/min via INTRAVENOUS
  Administered 2024-10-11: 6 ug/min via INTRAVENOUS
  Filled 2024-10-10 (×2): qty 250

## 2024-10-10 MED ORDER — ROCURONIUM BROMIDE 10 MG/ML (PF) SYRINGE
PREFILLED_SYRINGE | INTRAVENOUS | Status: DC | PRN
  Administered 2024-10-10: 50 mg via INTRAVENOUS
  Administered 2024-10-10 (×2): 10 mg via INTRAVENOUS

## 2024-10-10 MED ORDER — PROPOFOL 10 MG/ML IV BOLUS
INTRAVENOUS | Status: DC | PRN
  Administered 2024-10-10: 100 mg via INTRAVENOUS

## 2024-10-10 MED ORDER — PANTOPRAZOLE SODIUM 40 MG IV SOLR
40.0000 mg | Freq: Once | INTRAVENOUS | Status: AC
  Administered 2024-10-10: 40 mg via INTRAVENOUS
  Filled 2024-10-10: qty 10

## 2024-10-10 MED ORDER — PHENYLEPHRINE HCL-NACL 20-0.9 MG/250ML-% IV SOLN: INTRAVENOUS | Status: DC | PRN

## 2024-10-10 MED ORDER — NOREPINEPHRINE 4 MG/250ML-% IV SOLN: 0.0000 ug/min | INTRAVENOUS | Status: DC

## 2024-10-10 MED ORDER — FENTANYL CITRATE (PF) 100 MCG/2ML IJ SOLN
INTRAMUSCULAR | Status: DC | PRN
  Administered 2024-10-10: 25 ug via INTRAVENOUS

## 2024-10-10 MED ORDER — SODIUM CHLORIDE 0.9 % IV SOLN
1.0000 g | INTRAVENOUS | Status: AC
  Administered 2024-10-10 – 2024-10-13 (×5): 1 g via INTRAVENOUS
  Filled 2024-10-10 (×4): qty 10

## 2024-10-10 MED ORDER — IOHEXOL 350 MG/ML SOLN
75.0000 mL | Freq: Once | INTRAVENOUS | Status: AC | PRN
  Administered 2024-10-10: 75 mL via INTRAVENOUS

## 2024-10-10 MED ORDER — SODIUM CHLORIDE 0.9 % IV SOLN: 250.0000 mL | INTRAVENOUS | Status: AC

## 2024-10-10 MED ORDER — SODIUM CHLORIDE 0.9% IV SOLUTION: Freq: Once | INTRAVENOUS | Status: DC

## 2024-10-10 MED ORDER — ROCURONIUM BROMIDE 100 MG/10ML IV SOLN
INTRAVENOUS | Status: DC | PRN
  Administered 2024-10-10: 40 mg via INTRAVENOUS

## 2024-10-10 MED ORDER — SODIUM CHLORIDE 0.9 % IV SOLN
50.0000 ug/h | INTRAVENOUS | Status: DC
  Administered 2024-10-10 – 2024-10-12 (×6): 50 ug/h via INTRAVENOUS
  Filled 2024-10-10 (×7): qty 1

## 2024-10-10 MED ORDER — CHLORHEXIDINE GLUCONATE CLOTH 2 % EX PADS
6.0000 | MEDICATED_PAD | Freq: Every day | CUTANEOUS | Status: DC
  Administered 2024-10-10 – 2024-10-21 (×12): 6 via TOPICAL

## 2024-10-10 MED ORDER — POLYETHYLENE GLYCOL 3350 17 G PO PACK
17.0000 g | PACK | Freq: Every day | ORAL | Status: DC
  Administered 2024-10-11: 17 g
  Filled 2024-10-10 (×2): qty 1

## 2024-10-10 MED ORDER — OCTREOTIDE LOAD VIA INFUSION
50.0000 ug | Freq: Once | INTRAVENOUS | Status: AC
  Administered 2024-10-10: 50 ug via INTRAVENOUS
  Filled 2024-10-10: qty 25

## 2024-10-10 MED ORDER — PHENYLEPHRINE HCL-NACL 20-0.9 MG/250ML-% IV SOLN
INTRAVENOUS | Status: DC | PRN
  Administered 2024-10-10: 50 ug/min via INTRAVENOUS

## 2024-10-10 MED ORDER — SODIUM TETRADECYL SULFATE 3 % IV SOLN
INTRAVENOUS | Status: AC | PRN
  Administered 2024-10-10: 4 mL via INTRAVENOUS

## 2024-10-10 MED ORDER — ORAL CARE MOUTH RINSE: 15.0000 mL | OROMUCOSAL | Status: DC | PRN

## 2024-10-10 MED ORDER — LACTATED RINGERS IV SOLN: INTRAVENOUS | Status: DC | PRN

## 2024-10-10 MED ORDER — FENTANYL CITRATE (PF) 100 MCG/2ML IJ SOLN
INTRAMUSCULAR | Status: AC
  Filled 2024-10-10: qty 2

## 2024-10-10 MED ORDER — LACTULOSE 10 GM/15ML PO SOLN
20.0000 g | Freq: Three times a day (TID) | ORAL | Status: DC
  Administered 2024-10-11 (×2): 20 g via ORAL
  Filled 2024-10-10 (×3): qty 30

## 2024-10-10 MED ORDER — FENTANYL CITRATE (PF) 100 MCG/2ML IJ SOLN: 50.0000 ug | INTRAMUSCULAR | Status: DC | PRN

## 2024-10-10 MED ORDER — SENNA 8.6 MG PO TABS: 1.0000 | ORAL_TABLET | Freq: Two times a day (BID) | ORAL | Status: DC | PRN

## 2024-10-10 MED ORDER — PROPOFOL 500 MG/50ML IV EMUL
INTRAVENOUS | Status: DC | PRN
  Administered 2024-10-10: 50 ug/kg/min via INTRAVENOUS

## 2024-10-10 MED ORDER — IOHEXOL 300 MG/ML  SOLN
150.0000 mL | Freq: Once | INTRAMUSCULAR | Status: AC | PRN
  Administered 2024-10-15: 90 mL via INTRAVENOUS

## 2024-10-10 MED ORDER — SODIUM CHLORIDE 0.9 % IV SOLN
INTRAVENOUS | Status: AC
  Filled 2024-10-10: qty 20

## 2024-10-10 MED ORDER — FENTANYL CITRATE (PF) 100 MCG/2ML IJ SOLN
INTRAMUSCULAR | Status: DC | PRN
  Administered 2024-10-10 (×2): 25 ug via INTRAVENOUS

## 2024-10-10 MED ORDER — POLYETHYLENE GLYCOL 3350 17 G PO PACK: 17.0000 g | PACK | Freq: Every day | ORAL | Status: DC | PRN

## 2024-10-10 MED ORDER — PANTOPRAZOLE SODIUM 40 MG IV SOLR
40.0000 mg | Freq: Two times a day (BID) | INTRAVENOUS | Status: DC
  Administered 2024-10-10 – 2024-10-11 (×3): 40 mg via INTRAVENOUS
  Filled 2024-10-10 (×3): qty 10

## 2024-10-10 MED ORDER — ONDANSETRON HCL 4 MG/2ML IJ SOLN
INTRAMUSCULAR | Status: DC | PRN
  Administered 2024-10-10: 4 mg via INTRAVENOUS

## 2024-10-10 MED ORDER — ONDANSETRON HCL 4 MG/2ML IJ SOLN
4.0000 mg | Freq: Once | INTRAMUSCULAR | Status: AC
  Administered 2024-10-10: 4 mg via INTRAVENOUS
  Filled 2024-10-10: qty 2

## 2024-10-10 MED ORDER — DEXAMETHASONE SOD PHOSPHATE PF 10 MG/ML IJ SOLN
INTRAMUSCULAR | Status: DC | PRN
  Administered 2024-10-10: 10 mg via INTRAVENOUS

## 2024-10-10 MED ORDER — ONDANSETRON HCL 4 MG/2ML IJ SOLN
4.0000 mg | Freq: Four times a day (QID) | INTRAMUSCULAR | Status: DC | PRN
  Administered 2024-10-11 (×2): 4 mg via INTRAVENOUS
  Filled 2024-10-10 (×2): qty 2

## 2024-10-10 MED ORDER — INSULIN ASPART 100 UNIT/ML IJ SOLN
0.0000 [IU] | INTRAMUSCULAR | Status: DC
  Administered 2024-10-10: 2 [IU] via SUBCUTANEOUS
  Administered 2024-10-10 (×2): 3 [IU] via SUBCUTANEOUS
  Administered 2024-10-10 – 2024-10-11 (×3): 2 [IU] via SUBCUTANEOUS
  Administered 2024-10-11: 3 [IU] via SUBCUTANEOUS
  Administered 2024-10-11: 2 [IU] via SUBCUTANEOUS
  Administered 2024-10-11 (×2): 3 [IU] via SUBCUTANEOUS
  Administered 2024-10-12 (×3): 2 [IU] via SUBCUTANEOUS
  Filled 2024-10-10 (×2): qty 2
  Filled 2024-10-10: qty 3
  Filled 2024-10-10: qty 2
  Filled 2024-10-10: qty 3
  Filled 2024-10-10 (×3): qty 2
  Filled 2024-10-10 (×2): qty 3
  Filled 2024-10-10 (×3): qty 2

## 2024-10-10 MED ORDER — METOCLOPRAMIDE HCL 5 MG/ML IJ SOLN
10.0000 mg | INTRAMUSCULAR | Status: AC
  Administered 2024-10-10: 10 mg via INTRAVENOUS
  Filled 2024-10-10: qty 2

## 2024-10-10 MED ORDER — ONDANSETRON HCL 4 MG/2ML IJ SOLN
INTRAMUSCULAR | Status: AC
  Administered 2024-10-10: 4 mg
  Filled 2024-10-10: qty 2

## 2024-10-10 MED ORDER — LIDOCAINE 2% (20 MG/ML) 5 ML SYRINGE
INTRAMUSCULAR | Status: DC | PRN
  Administered 2024-10-10: 80 mg via INTRAVENOUS

## 2024-10-10 NOTE — Transfer of Care (Signed)
 Immediate Anesthesia Transfer of Care Note  Patient: Susan [name]  Procedure(s) Performed: RADIOLOGY WITH ANESTHESIA  Patient Location: ICU  Anesthesia Type:General  Level of Consciousness: sedated, unresponsive, and Patient remains intubated per anesthesia plan  Airway & Oxygen Therapy: Patient remains intubated per anesthesia plan and Patient placed on Ventilator (see vital sign flow sheet for setting)  Post-op Assessment: Report given to RN and Post -op Vital signs reviewed and stable  Post vital signs: Reviewed and stable  Last Vitals:  Vitals Value Taken Time  BP    Temp    Pulse 79 10/10/24 17:42  Resp 20 10/10/24 17:42  SpO2 99 % 10/10/24 17:42  Vitals shown include unfiled device data.  Last Pain:  Vitals:   10/10/24 1150  TempSrc: Axillary  PainSc:          Complications: No notable events documented.

## 2024-10-10 NOTE — Anesthesia Procedure Notes (Signed)
 Procedure Name: Intubation Date/Time: 10/10/2024 10:46 AM  Performed by: Loreli Blima LABOR, CRNAPre-anesthesia Checklist: Patient identified, Emergency Drugs available, Suction available and Patient being monitored Patient Re-evaluated:Patient Re-evaluated prior to induction Oxygen Delivery Method: Circle System Utilized Preoxygenation: Pre-oxygenation with 100% oxygen Induction Type: IV induction, Rapid sequence and Cricoid Pressure applied Laryngoscope Size: Glidescope and 3 Grade View: Grade I Tube type: Oral Tube size: 7.0 mm Number of attempts: 1 Airway Equipment and Method: Stylet Placement Confirmation: ETT inserted through vocal cords under direct vision, positive ETCO2 and breath sounds checked- equal and bilateral Secured at: 22 cm Tube secured with: Tape Dental Injury: Teeth and Oropharynx as per pre-operative assessment

## 2024-10-10 NOTE — Consult Note (Signed)
 "   Consultation  Referring Provider: CCM/Ellison Primary Care Physician:  Corwin Antu, FNP Primary Gastroenterologist:  Dr. Leigh Quinton hepatology/Drazek NP  Reason for Consultation: Acute upper GI bleed in setting of PBC with cirrhosis  HPI: Susan [name] is a 59 y.o. female, known to Dr. 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  Disease complicated by development of cirrhosis and possible gastric varices. She also has history of diabetes mellitus, hypertension and hyperlipidemia. She has been maintained on Actigall  long-term and is on Coreg  for management of portal hypertension. She had a recent admission in December 2025 with hepatic encephalopathy and was started on lactulose .  Her husband says she has been mentating well. She has not had any prior history of GI bleeding, her last EGD was done in 2019, no esophageal varices noted but there was possibility of a type I isolated gastric varix limited view due to angulated proximal stomach. Colonoscopy at same setting with a 3 mm polyp removed from the cecum internal hemorrhoids and 1 other diminutive polyp removed.  Patient presented to the emergency room late last evening after she had onset of nausea and vomiting after dinner last night and noticed red blood in the emesis.  She had an episode of lightheadedness while sitting on the commode but did not have any melena or hematochezia at home.  Husband had to assist her to floor in the bathroom.  Patient at this point has had several episodes of hematemesis which has been primarily bright red blood, initially had some dark material at home.  She does not have any complaint of chest pain, no heartburn or indigestion no abdominal pain does feel nauseated.  No aspirin or NSAID use  Workup in the ER last night - blood pressure 98/53 pulse in the 80s  CT abdomen and pelvis  with contrast-nodular liver consistent with cirrhosis ,gallstone in gallbladder, stomach moderately distended with fluid and debris mild wall thickening of the right colon possible portal colopathy or colitis associated upper abdominal varices noted, and spontaneous left splenorenal shunt.  Labs-on arrival WBC 12.1/hemoglobin 10.7 down from baseline of 12.9 about a month previous, platelets 240 Sodium 137/BUN 24/creatinine 1.0 Albumin 3 T. bili 1.6 LFTs otherwise normal  INR 1.1/pro time 15.1 Ammonia 113 this a.m. She received 1 unit of packed RBCs and hemoglobin at 8 AM this a.m. is 10.6/hematocrit 31.1   She did have 1 bowel movement this morning which was described as dark brown Mentating well currently Husband at bedside Blood pressure 87/50, MAP 62  On IV Rocephin  and IV octreotide  infusion   Past Medical History:  Diagnosis Date   Allergy    Anemia    Cirrhosis (HCC)    Constipation    Contact lens/glasses fitting    wears contacts or glasses   Diabetes mellitus without complication (HCC)    Edema of both lower extremities    Food allergy    Gastric varices    GERD (gastroesophageal reflux disease)    occ pepcid ac   Heart murmur    Hyperlipidemia    Hypertension    Joint pain    Periumbilical hernia    Primary biliary cholangitis (HCC)    AMA negative PBC   Snores    Vitamin D  deficiency     Past Surgical History:  Procedure Laterality Date   BREAST BIOPSY Left 10/02/2023   MM LT BREAST BX W LOC  DEV 1ST LESION IMAGE BX SPEC STEREO GUIDE 10/02/2023 [facility]   BREAST BIOPSY Left 10/02/2023   MM LT BREAST BX W LOC DEV EA AD LESION IMG BX SPEC STEREO GUIDE 10/02/2023 [facility]   DILATION AND CURETTAGE OF UTERUS  11/1995, 10/1997   LEEP     LIVER BIOPSY     TUBAL LIGATION  10/1997   UMBILICAL HERNIA REPAIR N/A 08/11/2013   Procedure: HERNIA REPAIR UMBILICAL ADULT;  Surgeon: Lynwood MALVA Pina, MD;  Location: [facility];  Service: General;   Laterality: N/A;    Prior to Admission medications  Medication Sig Start Date End Date Taking? Authorizing Provider  carvedilol  (COREG ) 3.125 MG tablet Take 3.125 mg by mouth 2 (two) times daily.   Yes [provider]  hydrochlorothiazide  (HYDRODIURIL ) 25 MG tablet TAKE 1 TABLET BY MOUTH DAILY 01/07/24  Yes Dugal, Tabitha, FNP  lactulose  (CHRONULAC ) 10 GM/15ML solution Take 45 mLs (30 g total) by mouth 2 (two) times daily. 08/29/24  Yes Mdala-Gausi, Masiku Agatha, MD  spironolactone  (ALDACTONE ) 50 MG tablet Take 1 tablet (50 mg total) by mouth daily. 09/07/24  Yes Dugal, Tabitha, FNP  ursodiol  (ACTIGALL ) 300 MG capsule TAKE 2 CAPSULES BY MOUTH TWICE  DAILY 10/22/23  Yes Armbruster, Elspeth SQUIBB, MD    Current Facility-Administered Medications  Medication Dose Route Frequency Provider Last Rate Last Admin   0.9 %  sodium chloride  infusion (Manually program via Guardrails IV Fluids)   Intravenous Once Lemly, Tatum N, MD   Held at 10/10/24 0334   cefTRIAXone  (ROCEPHIN ) 1 g in sodium chloride  0.9 % 100 mL IVPB  1 g Intravenous Q24H Alghanim, Fahid, MD 200 mL/hr at 10/10/24 0810 1 g at 10/10/24 0810   Chlorhexidine  Gluconate Cloth 2 % PADS 6 each  6 each Topical Daily Hattar, Zola SAILOR, MD   6 each at 10/10/24 0800   insulin  aspart (novoLOG ) injection 0-9 Units  0-9 Units Subcutaneous Q4H Hattar, Laith N, MD   2 Units at 10/10/24 0759   metoCLOPramide  (REGLAN ) injection 10 mg  10 mg Intravenous NOW Zan Triska S, PA-C       norepinephrine  (LEVOPHED ) 4mg  in (0.016 mg/mL) premix infusion  0-40 mcg/min Intravenous Titrated Alghanim, Fahid, MD       octreotide  (SANDOSTATIN ) 500 mcg in sodium chloride  0.9 % 250 mL (2 mcg/mL) infusion  50 mcg/hr Intravenous Continuous Simon Lavonia SAILOR, MD 25 mL/hr at 10/10/24 0700 50 mcg/hr at 10/10/24 0700   ondansetron  (ZOFRAN ) injection 4 mg  4 mg Intravenous Q6H PRN Alghanim, Fahid, MD       Oral care mouth rinse  15 mL Mouth Rinse PRN Hattar, Zola SAILOR, MD        pantoprazole  (PROTONIX ) injection 40 mg  40 mg Intravenous Q12H Hattar, Zola SAILOR, MD       polyethylene glycol (MIRALAX  / GLYCOLAX ) packet 17 g  17 g Oral Daily PRN Hattar, Zola SAILOR, MD       senna (SENOKOT) tablet 8.6 mg  1 tablet Oral BID PRN Zaida Zola SAILOR, MD        Allergies as of 10/09/2024 - Reviewed 10/09/2024  Allergen Reaction Noted   Grapefruit concentrate Other (See Comments) 11/20/2016   Mounjaro  [tirzepatide ] Other (See Comments) 09/02/2024   Sulfa antibiotics Rash 04/29/2012   Amlodipine Other (See Comments) 11/02/2022   Crestor  [rosuvastatin ]  11/02/2022   Mushroom extract complex (obsolete)  02/21/2014   Shellfish allergy Hives 02/21/2014   Simvastatin   11/02/2022  Strawberry extract  02/21/2014   Metformin  and related Diarrhea 05/13/2023    Family History  Problem Relation Age of Onset   Breast cancer Mother    Schizophrenia Mother    Depression Mother    Uterine cancer Mother    Hypertension Mother    Obesity Mother    Renal Disease Father    Diabetes Father    Other Father        Agent Orange   Lung cancer Maternal Grandmother    Lung cancer Maternal Grandfather    Heart disease Maternal Uncle    Diabetes Other        Aunts and Uncles on both sides   Heart disease Maternal Aunt    Colon cancer Neg Hx    Esophageal cancer Neg Hx    Stomach cancer Neg Hx    Rectal cancer Neg Hx     Social History   Socioeconomic History   Marital status: Married    Spouse name: Dwayne   Number of children: 1   Years of education: Not on file   Highest education level: Some college, no degree  Occupational History   Occupation: Interior And Spatial Designer of HR for [employer]  Tobacco Use   Smoking status: Never   Smokeless tobacco: Never  Vaping Use   Vaping status: Never Used  Substance and Sexual Activity   Alcohol use: Yes    Comment: wine 2 times a year   Drug use: No   Sexual activity: Yes    Partners: Male    Birth control/protection: Surgical  Other  Topics Concern   Not on file  Social History Narrative   Not on file   Social Drivers of Health   Tobacco Use: Low Risk (10/09/2024)   Patient History    Smoking Tobacco Use: Never    Smokeless Tobacco Use: Never    Passive Exposure: Not on file  Financial Resource Strain: Low Risk (08/17/2024)   Overall Financial Resource Strain (CARDIA)    Difficulty of Paying Living Expenses: Not very hard  Food Insecurity: No Food Insecurity (08/28/2024)   Epic    Worried About Radiation Protection Practitioner of Food in the Last Year: Never true    Ran Out of Food in the Last Year: Never true  Transportation Needs: No Transportation Needs (08/28/2024)   Epic    Lack of Transportation (Medical): No    Lack of Transportation (Non-Medical): No  Physical Activity: Insufficiently Active (08/17/2024)   Exercise Vital Sign    Days of Exercise per Week: 1 day    Minutes of Exercise per Session: 10 min  Stress: No Stress Concern Present (08/17/2024)   [employer] - Occupational Stress Questionnaire    Feeling of Stress: Only a little  Social Connections: Moderately Integrated (08/17/2024)   Social Connection and Isolation Panel    Frequency of Communication with Friends and Family: More than three times a week    Frequency of Social Gatherings with Friends and Family: Once a week    Attends Religious Services: More than 4 times per year    Active Member of [employer] or Organizations: No    Attends Banker Meetings: Not on file    Marital Status: Married  Intimate Partner Violence: Not At Risk (08/29/2024)   Epic    Fear of Current or Ex-Partner: No    Emotionally Abused: No    Physically Abused: No    Sexually Abused: No  Depression (PHQ2-9): Medium Risk (08/17/2024)  Depression (PHQ2-9)    PHQ-2 Score: 8  Alcohol Screen: Low Risk (05/07/2023)   Alcohol Screen    Last Alcohol Screening Score (AUDIT): 0  Housing: Low Risk (08/28/2024)   Epic    Unable to Pay for Housing in  the Last Year: No    Number of Times Moved in the Last Year: 0    Homeless in the Last Year: No  Utilities: Not At Risk (08/28/2024)   Epic    Threatened with loss of utilities: No  Health Literacy: Not on file    Review of Systems: Pertinent positive and negative review of systems were noted in the above HPI section.  All other review of systems was otherwise negative.   Physical Exam: Vital signs in last 24 hours: Temp:  [97.8 F (36.6 C)-98.5 F (36.9 C)] 98.5 F (36.9 C) (01/17 0749) Pulse Rate:  [75-84] 81 (01/17 0600) Resp:  [12-25] 25 (01/17 0600) BP: (88-124)/(44-68) 111/58 (01/17 0600) SpO2:  [95 %-100 %] 95 % (01/17 0600) Weight:  [83 kg-84.2 kg] 84.2 kg (01/17 0400)   General:   Alert,  Well-developed, well-nourished, African-American female pleasant and cooperative in NAD, husband at bedside Head:  Normocephalic and atraumatic. Eyes:  Sclera clear, no icterus.   Conjunctiva pink. Ears:  Normal auditory acuity. Nose:  No deformity, discharge,  or lesions. Mouth:  No deformity or lesions.   Neck:  Supple; no masses or thyromegaly. Lungs:  Clear throughout to auscultation.   No wheezes, crackles, or rhonchi.  Heart:  Regular rate and rhythm; no murmurs, clicks, rubs,  or gallops. Abdomen:  Soft, obese, nontender, no appreciable fluid wave BS active,nonpalp mass or hsm.   Rectal: Not done, had a dark brown stool earlier this morning Msk:  Symmetrical without gross deformities. . Pulses:  Normal pulses noted. Extremities:  Without clubbing or edema. Neurologic:  Alert and  oriented x4;  grossly normal neurologically.  No asterixis Skin:  Intact without significant lesions or rashes.. Psych:  Alert and cooperative. Normal mood and affect.  Intake/Output from previous day: 01/16 0701 - 01/17 0700 In: 2524.4 [I.V.:143.2; Blood:381.3; IV Piggyback:2000] Out: 100 [Emesis/NG output:100] Intake/Output this shift: No intake/output data recorded.  Lab Results: Recent  Labs    10/09/24 2348 10/10/24 0157 10/10/24 0754  WBC 12.1* 10.2 10.0  HGB 10.7* 9.2* 10.6*  HCT 32.3* 27.7* 31.1*  PLT 240 207 205   BMET Recent Labs    10/09/24 2348 10/10/24 0754  NA 137 137  K 4.7 4.9  CL 102 106  CO2 25 22  GLUCOSE 190* 191*  BUN 24* 33*  CREATININE 1.00 1.10*  CALCIUM  9.3 8.4*   LFT Recent Labs    10/09/24 2348  PROT 6.5  ALBUMIN 3.0*  AST 40  ALT 20  ALKPHOS 124  BILITOT 1.6*   PT/INR Recent Labs    10/10/24 0040  LABPROT 15.1  INR 1.1   Hepatitis Panel No results for input(s): HEPBSAG, HCVAB, HEPAIGM, HEPBIGM in the last 72 hours.    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  10.6 at 8 AM  Upper abdominal varices noted on CT done on admission, no ascites  Last EGD 2019 no esophageal varices, possible isolated type I gastric varix  Etiology of current upper GI bleeding concerning for acute variceal hemorrhage, esophageal versus gastric, versus possible ulcer disease, portal gastropathy  MELD 3.0: 12 at 10/10/2024  7:54 AM MELD-Na: 10 at 10/10/2024  7:54 AM Calculated from: Serum Creatinine: 1.1 mg/dL at 8/82/7973  2:45 AM Serum Sodium: 137 mmol/L at 10/10/2024  7:54 AM Total Bilirubin: 1.6 mg/dL at 8/83/7973 88:51 PM Serum Albumin: 3 g/dL at 8/83/7973 88:51 PM INR(ratio): 1.1 at 10/10/2024 12:40 AM Age at listing (hypothetical): 59 years Sex: Female at 10/10/2024  7:54 AM  #2 anemia acute secondary to acute GI blood loss #3 recent diagnosis of hepatic encephalopathy with brief admission in December, had been doing well on lactulose  at home  #4 diabetes mellitus #5 history of  hypertension    PLAN: Keep n.p.o.  Will schedule for EGD this morning with Dr. Albertus.  Patient will require intubation for procedure, at this time plan to bring to endoscopy.  EGD discussed in detail with the patient and her husband including indications risks and benefits and they are agreeable to proceed  Continue IV octreotide  infusion IV Protonix  twice daily  Reglan  10 mg IV now to clear stomach  Continue serial hemoglobins every 6 hours and transfuse as indicated to keep her hemoglobin 7  Blood pressure soft, discussed with CCM, will start pressor support prior to procedure  GI will follow closely with you   Lakara Weiland EsterwoodPA-C  10/10/2024, 8:57 AM    "

## 2024-10-10 NOTE — Progress Notes (Signed)
 eLink Physician-Brief Progress Note Patient Name: Susan [name] DOB: 1965-10-17 MRN: [id number]   Date of Service  10/10/2024  HPI/Events of Note  47F with autoimmune liver cirrhosis, hx esophageal varices, primary biliary cholangitis who p/w hematemesis. Admitted to ICU for upper GI bleed. Receiving PRBC x 1 now. Currently HDS with SBP 100s.  Hg 10.7>9.2 CMET sign for albumin 3, T bili 1.6 otherwise unremarkable  eICU Interventions  Trend CBC Received PRBC now PPI, octreotide  NPO     Intervention Category Evaluation Type: New Patient Evaluation  Susan [name] Staff 10/10/2024, 4:33 AM

## 2024-10-10 NOTE — Sedation Documentation (Signed)
 PAP 26

## 2024-10-10 NOTE — Op Note (Signed)
 [facility] Patient Name: Susan [name] Procedure Date : 10/10/2024 MRN: [id number] Attending MD: Gordy CHRISTELLA Starch , MD, [id number] Date of Birth: 1966/06/19 CSN: [id number] Age: 59 Admit Type: Inpatient Procedure:                Upper GI endoscopy Indications:              Acute post hemorrhagic anemia, Hematemesis, Active                            gastrointestinal bleeding, Cirrhosis with suspected                            esophageal/gastric varices Providers:                Gordy CHRISTELLA. Starch, MD, Almarie Masters, RN, Felice Sar,                            Technician Referring MD:             [facility] Medicines:                General Anesthesia Complications:            No immediate complications. Estimated Blood Loss:     Estimated blood loss: none. Procedure:                Pre-Anesthesia Assessment:                           - Prior to the procedure, a History and Physical                            was performed, and patient medications and                            allergies were reviewed. The patient's tolerance of                            previous anesthesia was also reviewed. The risks                            and benefits of the procedure and the sedation                            options and risks were discussed with the patient.                            All questions were answered, and informed consent                            was obtained. Prior Anticoagulants: The patient has                            taken no anticoagulant or antiplatelet agents. ASA  Grade Assessment: IV - A patient with severe                            systemic disease that is a constant threat to life.                            After reviewing the risks and benefits, the patient                            was deemed in satisfactory condition to undergo the                            procedure.                           After  obtaining informed consent, the endoscope was                            passed under direct vision. Throughout the                            procedure, the patient's blood pressure, pulse, and                            oxygen saturations were monitored continuously. The                            [id number]-[id number] ([id number] Olympus endoscope was introduced                            through the mouth, and advanced to the second part                            of duodenum. The upper GI endoscopy was                            accomplished without difficulty. The patient                            tolerated the procedure well. Scope In: Scope Out: Findings:      The examined esophagus was normal.      Clotted blood was found in the cardia, in the gastric fundus and in the       gastric body. Large amount. Partially cleared with irrigation, lavage       and Roth net. No able to completely visualize the fundus and gastric       body.      Type 1 isolated gastric varices (IGV1, varices located in the cardia and       fundus) with no bleeding were found in the cardia. There as stigmata of       recent bleeding (red spot). They were medium in largest diameter.      No gross lesions were noted in the entire examined duodenum. Impression:               - Normal  esophagus.                           - Clotted blood in the cardia, in the gastric                            fundus and in the gastric body. Partially removed.                           - Type 1 isolated gastric varices (IGV1, varices                            located in the fundus), without bleeding now, but                            with stigmata (red spot).                           - No gross lesions in the entire examined duodenum.                           - No specimens collected. Moderate Sedation:      N/A Recommendation:           - Return patient to ICU for ongoing care.                           - NPO.                            - Continue present medications including octreotide                             gtt and BID IV PPI.                           - Leave intubated for now.                           - IR consult, I have contacted Dr. Jennefer for                            consideration of embolization.                           - Closely monitor Hgb.                           - GI will follow. Procedure Code(s):        --- Professional ---                           [phone], Esophagogastroduodenoscopy, flexible,                            transoral; diagnostic, including collection of  specimen(s) by brushing or washing, when performed                            (separate procedure) Diagnosis Code(s):        --- Professional ---                           K92.2, Gastrointestinal hemorrhage, unspecified                           I86.4, Gastric varices                           D62, Acute posthemorrhagic anemia                           K92.0, Hematemesis                           K74.60, Unspecified cirrhosis of liver CPT copyright 2022 American Medical Association. All rights reserved. The codes documented in this report are preliminary and upon coder review may  be revised to meet current compliance requirements. Gordy CHRISTELLA Starch, MD 10/10/2024 11:33:53 AM This report has been signed electronically. Number of Addenda: 0

## 2024-10-10 NOTE — ED Notes (Signed)
 Patient transported to CT

## 2024-10-10 NOTE — Procedures (Signed)
 Interventional Radiology Procedure Note  Procedure:  1) TIPS creation 2) Coil embolization of posterior and left gastric veins 3) Coil assisted retrograde transvenous obliteration of gastrorenal shunt  Findings: Please refer to procedural dictation for full description. Right internal jugular 10 Fr and right common femoral vein 8 Fr sheath access, manual compression for hemostasis.  Complications: None immediate  Estimated Blood Loss: < 5 mL  Recommendations: MELD labs in am. IR will follow.   Susan Sides, MD

## 2024-10-10 NOTE — Progress Notes (Signed)
 Patient left intubated after EGD with large amount of blood clots in stomach. No intervention. Recommend IR. IR at bedside to evaluate and decide on intervention. For now, will leave intubated on propofol  drip and PRN fentanyl  pushes. Will switch phenylephrine  to levophed . Wean as tolerated.  Additional CC time: 30 minutes.

## 2024-10-10 NOTE — Progress Notes (Signed)
 "  NAME:  Susan [name], MRN:  [id number], DOB:  28-Apr-1966, LOS: 0 ADMISSION DATE:  10/09/2024, CONSULTATION DATE:  10/10/24 REFERRING MD:  MELODIE, CHIEF COMPLAINT:  UGIB   History of Present Illness:  59 year old female with a past medical history of autoimmune liver cirrhosis, hepatic encephalopathy, esophageal varices, primary biliary cholangitis, hypertension, hyperlipidemia, obesity who presented to the hospital with multiple episodes of bloody emesis.   Of note, patient was recently admitted to the hospital and December 2025 with hepatic encephalopathy and is known to have esophageal varices.  In the ER, she was noted to have 1 more episode of bloody emesis of dark blood.  Her vitals otherwise have been stable and she is hemodynamically stable.  Laboratory workup significant for anemia with a hemoglobin of 10.7 on presentation with a baseline of 13.  Repeat hemoglobin check with drop to 9.2.  She is currently receiving on unit of blood.  CT head without any acute abnormality.  CT abdomen and pelvis showing nodular liver compatible with cirrhosis and moderately distended stomach with fluid and debris as well as mild wall thickening in the right colon.  No ascites noted.  Pertinent  Medical History  As above  Significant Hospital Events: Including procedures, antibiotic start and stop dates in addition to other pertinent events   10/10/2024 admitted to the ICU for UGIB.  Interim History / Subjective:  Patient had an episode of vomiting bright red blood. GI at bedside. Plan for endoscopy today. BP soft, started levophed  Drips: levophed , octreotide   Objective    Blood pressure (!) 111/58, pulse 81, temperature 98.5 F (36.9 C), temperature source Oral, resp. rate (!) 25, height 5' (1.524 m), weight 84.2 kg, SpO2 95%.        Intake/Output Summary (Last 24 hours) at 10/10/2024 0849 Last data filed at 10/10/2024 0700 Gross per 24 hour  Intake 2524.44 ml  Output 100 ml  Net 2424.44 ml    Filed Weights   10/09/24 2347 10/10/24 0400  Weight: 83 kg 84.2 kg    Examination: General: well appearing, no distress HENT: conjunctivae well injected Lungs: CTAB Cardiovascular: RRR, no murmurs Abdomen: soft, tender in epigastrium Extremities: no edema Neuro: alert, oriented x 3, no focal deficits, no asterixis GU: deferred  Resolved problem list   Assessment and Plan  #Hemorrhagic Shock: 2/2 UGIB, unclear etiology, concern for variceal or PUD. - 2 large bore IV - Octreotide  drip - PPI BID - Levophed  as needed to maintain MAP goal - CBC Q 8 H and transfuse to keep Hgb > 7 mg/dL - Consult GI for endoscopy, the patient likely needs to be intubated prior to procedure to prevent aspiration of blood material into the lung - Hold carvedilol  due to hypotension - Target MAP > 65 mmHg  #SBP prophylaxis: - Ceftriaxone  1 g IV daily (end date 1/21)  #Stress Ulcer PPx: already on PPI for UGIB  #Nutrition: strict NPO  #Autoimmune Liver Cirrhosis: MELD Score 10, 6% estimated 3 month mortality - No evidence of hepatic encephalopathy - Follow up with hepatology as an OP.  #VTE ppx: holding any chemical ppx due to GIB.  #DM: - SSI  Disposition: ICU appropriate for hemorrhagic shock.  Patient Lines/Drains/Airways Status     Active Line/Drains/Airways     Name Placement date Placement time Site Days   Peripheral IV 10/10/24 20 G Right Antecubital 10/10/24  0047  Antecubital  less than 1   Peripheral IV 10/10/24 20 G Anterior;Distal;Left Forearm 10/10/24  0205  Forearm  less than 1            Labs   CBC: Recent Labs  Lab 10/09/24 2348 10/10/24 0157 10/10/24 0754  WBC 12.1* 10.2 10.0  NEUTROABS  --   --  7.8*  HGB 10.7* 9.2* 10.6*  HCT 32.3* 27.7* 31.1*  MCV 93.4 94.2 92.6  PLT 240 207 205    Basic Metabolic Panel: Recent Labs  Lab 10/09/24 2348 10/10/24 0754  NA 137 137  K 4.7 4.9  CL 102 106  CO2 25 22  GLUCOSE 190* 191*  BUN 24* 33*   CREATININE 1.00 1.10*  CALCIUM  9.3 8.4*   GFR: Estimated Creatinine Clearance: 53.7 mL/min (A) (by C-G formula based on SCr of 1.1 mg/dL (H)). Recent Labs  Lab 10/09/24 2348 10/10/24 0157 10/10/24 0754  WBC 12.1* 10.2 10.0    Liver Function Tests: Recent Labs  Lab 10/09/24 2348  AST 40  ALT 20  ALKPHOS 124  BILITOT 1.6*  PROT 6.5  ALBUMIN 3.0*   Recent Labs  Lab 10/09/24 2348  LIPASE 84*   Recent Labs  Lab 10/10/24 0754  AMMONIA 113*    ABG    Component Value Date/Time   HCO3 24.7 08/28/2024 1212   TCO2 26 08/28/2024 1212   O2SAT 78 08/28/2024 1212     Coagulation Profile: Recent Labs  Lab 10/10/24 0040  INR 1.1    Cardiac Enzymes: No results for input(s): CKTOTAL, CKMB, CKMBINDEX, TROPONINI in the last 168 hours.  HbA1C: Hemoglobin A1C  Date/Time Value Ref Range Status  11/06/2016 12:00 AM 9.8  Final   Hgb A1c MFr Bld  Date/Time Value Ref Range Status  06/10/2024 09:20 AM 5.4 4.6 - 6.5 % Final    Comment:    Glycemic Control Guidelines for People with Diabetes:Non Diabetic:  <6%Goal of Therapy: <7%Additional Action Suggested:  >8%   05/07/2023 10:23 AM 11.0 (H) 4.6 - 6.5 % Final    Comment:    Glycemic Control Guidelines for People with Diabetes:Non Diabetic:  <6%Goal of Therapy: <7%Additional Action Suggested:  >8%     CBG: Recent Labs  Lab 10/10/24 0350 10/10/24 0724  GLUCAP 165* 170*    Review of Systems:   Not obtained  Past Medical History:  She,  has a past medical history of Allergy, Anemia, Cirrhosis (HCC), Constipation, Contact lens/glasses fitting, Diabetes mellitus without complication (HCC), Edema of both lower extremities, Food allergy, Gastric varices, GERD (gastroesophageal reflux disease), Heart murmur, Hyperlipidemia, Hypertension, Joint pain, Periumbilical hernia, Primary biliary cholangitis (HCC), Snores, and Vitamin D  deficiency.   Surgical History:   Past Surgical History:  Procedure Laterality Date    BREAST BIOPSY Left 10/02/2023   MM LT BREAST BX W LOC DEV 1ST LESION IMAGE BX SPEC STEREO GUIDE 10/02/2023 [facility]   BREAST BIOPSY Left 10/02/2023   MM LT BREAST BX W LOC DEV EA AD LESION IMG BX SPEC STEREO GUIDE 10/02/2023 [facility]   DILATION AND CURETTAGE OF UTERUS  11/1995, 10/1997   LEEP     LIVER BIOPSY     TUBAL LIGATION  10/1997   UMBILICAL HERNIA REPAIR N/A 08/11/2013   Procedure: HERNIA REPAIR UMBILICAL ADULT;  Surgeon: Lynwood MALVA Pina, MD;  Location: [facility];  Service: General;  Laterality: N/A;     Social History:   reports that she has never smoked. She has never used smokeless tobacco. She reports current alcohol use. She reports that she does not use drugs.   Family  History:  Her family history includes Breast cancer in her mother; Depression in her mother; Diabetes in her father and another family member; Heart disease in her maternal aunt and maternal uncle; Hypertension in her mother; Lung cancer in her maternal grandfather and maternal grandmother; Obesity in her mother; Other in her father; Renal Disease in her father; Schizophrenia in her mother; Uterine cancer in her mother. There is no history of Colon cancer, Esophageal cancer, Stomach cancer, or Rectal cancer.   Allergies Allergies[1]   Home Medications  Prior to Admission medications  Medication Sig Start Date End Date Taking? Authorizing Provider  carvedilol  (COREG ) 3.125 MG tablet Take 3.125 mg by mouth 2 (two) times daily.   Yes [provider]  hydrochlorothiazide  (HYDRODIURIL ) 25 MG tablet TAKE 1 TABLET BY MOUTH DAILY 01/07/24  Yes Dugal, Tabitha, FNP  lactulose  (CHRONULAC ) 10 GM/15ML solution Take 45 mLs (30 g total) by mouth 2 (two) times daily. 08/29/24  Yes Mdala-Gausi, Masiku Agatha, MD  spironolactone  (ALDACTONE ) 50 MG tablet Take 1 tablet (50 mg total) by mouth daily. 09/07/24  Yes Dugal, Ginger, FNP  ursodiol  (ACTIGALL ) 300 MG capsule TAKE 2 CAPSULES BY MOUTH TWICE   DAILY 10/22/23  Yes Armbruster, Elspeth SQUIBB, MD     Due to a high probability of clinically significant, life threatening deterioration, the patient required my highest level of preparedness to intervene emergently and I personally spent this critical care time directly and personally managing the patient. This critical care time included obtaining a history; examining the patient; pulse oximetry; ordering and review of studies; arranging urgent treatment with development of a management plan; evaluation of patient's response to treatment; frequent reassessment; and, discussions with other providers.  This critical care time was performed to assess and manage the high probability of imminent, life-threatening deterioration that could result in multi-organ failure. It was exclusive of separately billable procedures and treating other patients and teaching time. Critical Care Time: 35 minutes.  Paula Southerly, MD [facility]             [1]  Allergies Allergen Reactions   Mushroom Extract Complex (Obsolete) Anaphylaxis    Throat swells   Grapefruit Concentrate Other (See Comments)    blisters   Mounjaro  [Tirzepatide ] Other (See Comments)    Hepatic encephalopathy   Sulfa Antibiotics Rash    Severe dehydration   Amlodipine Other (See Comments)    Heart rate fast   Crestor  [Rosuvastatin ]     Brain fog, fatigue, felt 'terrible'.    Shellfish Allergy Hives        Simvastatin      Brain fog, fatigue, felt 'terrible'.    Strawberry Extract     hives   Metformin  And Related Diarrhea    Weakness    "

## 2024-10-10 NOTE — ED Notes (Signed)
 Pt BP dropped to 88/44 MAP 58, MD advised, blood administration still approved.

## 2024-10-10 NOTE — H&P (Signed)
 "  NAME:  Susan [name], MRN:  [id number], DOB:  Oct 06, 1965, LOS: 0 ADMISSION DATE:  10/09/2024, CONSULTATION DATE:  10/10/24 REFERRING MD:  EDP, CHIEF COMPLAINT: Hematemesis  History of Present Illness:  59 year old female with a past medical history of autoimmune liver cirrhosis, hepatic encephalopathy, esophageal varices, primary biliary cholangitis, hypertension, hyperlipidemia, obesity who presented to the hospital with multiple episodes of bloody emesis.  Of note, patient was recently admitted to the hospital and December 2025 with hepatic encephalopathy and is known to have esophageal varices.  In the ER, she was noted to have 1 more episode of bloody emesis of dark blood.  Her vitals otherwise have been stable and she is hemodynamically stable.  Laboratory workup significant for anemia with a hemoglobin of 10.7 on presentation with a baseline of 13.  Repeat hemoglobin check with drop to 9.2.  She is currently receiving on unit of blood.  CT head without any acute abnormality.  CT abdomen and pelvis showing nodular liver compatible with cirrhosis and moderately distended stomach with fluid and debris as well as mild wall thickening in the right colon.  No ascites noted.  Pertinent  Medical History  As above  Significant Hospital Events: Including procedures, antibiotic start and stop dates in addition to other pertinent events   10/10/2024 admitted to the ICU  Interim History / Subjective:  N/A  Objective    Blood pressure (!) 111/57, pulse 80, temperature 98.3 F (36.8 C), temperature source Oral, resp. rate (!) 21, height 5' (1.524 m), weight 83 kg, SpO2 100%.       No intake or output data in the 24 hours ending 10/10/24 0314 Filed Weights   10/09/24 2347  Weight: 83 kg    Examination: General: Middle-age female, not in acute distress, awake and alert HENT: Normocephalic/atraumatic Lungs: Clear to auscultation Cardiovascular: Regular rate and rhythm, normal S1,  S2 Abdomen: Soft, nontender, nondistended Extremities: Warm, well-perfused, no edema Neuro: Motor and sensation grossly intact, follows commands, alert and oriented   Resolved problem list   Assessment and Plan   # Suspected upper GI bleeding with history of varices - Trend hemoglobin every 8 hours - Continue PPI twice daily - Continue octreotide  drip - Transfuse for hemoglobin less than 7 or if actively bleeding - Active type and screen - Good peripheral IV access - GI aware per ER, possible scope in the AM - NPO -  Hold Coreg   - PT hemodynamically stable for the floor, able to protect airway. Will continue to follow along but likely safe for monitoring in progressive.   # Liver cirrhosis - No evidence of ascites - No indication for SBP prophylaxis - Not in active hepatic encephalopathy at this time - Holding lactulose  - Holding Usodiol   #Type II DM SSI  #HTN  - Hold Coreg  - HCTZ       Labs   CBC: Recent Labs  Lab 10/09/24 2348 10/10/24 0157  WBC 12.1* 10.2  HGB 10.7* 9.2*  HCT 32.3* 27.7*  MCV 93.4 94.2  PLT 240 207    Basic Metabolic Panel: Recent Labs  Lab 10/09/24 2348  NA 137  K 4.7  CL 102  CO2 25  GLUCOSE 190*  BUN 24*  CREATININE 1.00  CALCIUM  9.3   GFR: Estimated Creatinine Clearance: 58.6 mL/min (by C-G formula based on SCr of 1 mg/dL). Recent Labs  Lab 10/09/24 2348 10/10/24 0157  WBC 12.1* 10.2    Liver Function Tests: Recent Labs  Lab 10/09/24  2348  AST 40  ALT 20  ALKPHOS 124  BILITOT 1.6*  PROT 6.5  ALBUMIN 3.0*   Recent Labs  Lab 10/09/24 2348  LIPASE 84*   No results for input(s): AMMONIA in the last 168 hours.  ABG    Component Value Date/Time   HCO3 24.7 08/28/2024 1212   TCO2 26 08/28/2024 1212   O2SAT 78 08/28/2024 1212     Coagulation Profile: Recent Labs  Lab 10/10/24 0040  INR 1.1    Cardiac Enzymes: No results for input(s): CKTOTAL, CKMB, CKMBINDEX, TROPONINI in the last  168 hours.  HbA1C: Hemoglobin A1C  Date/Time Value Ref Range Status  11/06/2016 12:00 AM 9.8  Final   Hgb A1c MFr Bld  Date/Time Value Ref Range Status  06/10/2024 09:20 AM 5.4 4.6 - 6.5 % Final    Comment:    Glycemic Control Guidelines for People with Diabetes:Non Diabetic:  <6%Goal of Therapy: <7%Additional Action Suggested:  >8%   05/07/2023 10:23 AM 11.0 (H) 4.6 - 6.5 % Final    Comment:    Glycemic Control Guidelines for People with Diabetes:Non Diabetic:  <6%Goal of Therapy: <7%Additional Action Suggested:  >8%     CBG: No results for input(s): GLUCAP in the last 168 hours.  Review of Systems:   As above  Past Medical History:  She,  has a past medical history of Allergy, Anemia, Cirrhosis (HCC), Constipation, Contact lens/glasses fitting, Diabetes mellitus without complication (HCC), Edema of both lower extremities, Food allergy, Gastric varices, GERD (gastroesophageal reflux disease), Heart murmur, Hyperlipidemia, Hypertension, Joint pain, Periumbilical hernia, Primary biliary cholangitis (HCC), Snores, and Vitamin D  deficiency.   Surgical History:   Past Surgical History:  Procedure Laterality Date   BREAST BIOPSY Left 10/02/2023   MM LT BREAST BX W LOC DEV 1ST LESION IMAGE BX SPEC STEREO GUIDE 10/02/2023 [facility]   BREAST BIOPSY Left 10/02/2023   MM LT BREAST BX W LOC DEV EA AD LESION IMG BX SPEC STEREO GUIDE 10/02/2023 [facility]   DILATION AND CURETTAGE OF UTERUS  11/1995, 10/1997   LEEP     LIVER BIOPSY     TUBAL LIGATION  10/1997   UMBILICAL HERNIA REPAIR N/A 08/11/2013   Procedure: HERNIA REPAIR UMBILICAL ADULT;  Surgeon: Lynwood MALVA Pina, MD;  Location: [facility];  Service: General;  Laterality: N/A;     Social History:   reports that she has never smoked. She has never used smokeless tobacco. She reports current alcohol use. She reports that she does not use drugs.   Family History:  Her family history includes Breast cancer in her  mother; Depression in her mother; Diabetes in her father and another family member; Heart disease in her maternal aunt and maternal uncle; Hypertension in her mother; Lung cancer in her maternal grandfather and maternal grandmother; Obesity in her mother; Other in her father; Renal Disease in her father; Schizophrenia in her mother; Uterine cancer in her mother. There is no history of Colon cancer, Esophageal cancer, Stomach cancer, or Rectal cancer.   Allergies Allergies[1]   Home Medications  Prior to Admission medications  Medication Sig Start Date End Date Taking? Authorizing Provider  carvedilol  (COREG ) 3.125 MG tablet Take 3.125 mg by mouth 2 (two) times daily.    [provider]  hydrochlorothiazide  (HYDRODIURIL ) 25 MG tablet TAKE 1 TABLET BY MOUTH DAILY 01/07/24   Corwin Antu, FNP  lactulose  (CHRONULAC ) 10 GM/15ML solution Take 45 mLs (30 g total) by mouth 2 (two) times  daily. 08/29/24   Mdala-Gausi, Golden Pillow, MD  spironolactone  (ALDACTONE ) 50 MG tablet Take 1 tablet (50 mg total) by mouth daily. 09/07/24   Dugal, Tabitha, FNP  ursodiol  (ACTIGALL ) 300 MG capsule TAKE 2 CAPSULES BY MOUTH TWICE  DAILY 10/22/23   Armbruster, Elspeth SQUIBB, MD     Critical care time: N/A              [1]  Allergies Allergen Reactions   Grapefruit Concentrate Other (See Comments)    blisters   Mounjaro  [Tirzepatide ] Other (See Comments)    Hepatic encephalopathy   Sulfa Antibiotics Rash    Severe dehydration   Amlodipine Other (See Comments)    Heart rate fast   Crestor  [Rosuvastatin ]     Brain fog, fatigue, felt 'terrible'.    Mushroom Extract Complex (Obsolete)     Throat swells   Shellfish Allergy Hives        Simvastatin      Brain fog, fatigue, felt 'terrible'.    Strawberry Extract     hives   Metformin  And Related Diarrhea    Weakness    "

## 2024-10-10 NOTE — Procedures (Addendum)
 I spoke to the patient's husband in person and the patient's ICU room after the procedure. I let him know the findings and my recommendation for IR involvement for TIPS plus RTO. I have communicated with Dr. Jennefer with IR who has reviewed imaging and will plan to help with TIPS plus RTO.  GI will follow Patient remains intubated until at least after IR procedure

## 2024-10-10 NOTE — Transfer of Care (Signed)
 Immediate Anesthesia Transfer of Care Note  Patient: Susan [name]  Procedure(s) Performed: EGD (ESOPHAGOGASTRODUODENOSCOPY)  Patient Location: ICU  Anesthesia Type:General  Level of Consciousness: sedated  Airway & Oxygen Therapy: Patient remains intubated per anesthesia plan and Patient placed on Ventilator (see vital sign flow sheet for setting)  Post-op Assessment: Report given to RN and Post -op Vital signs reviewed and stable  Post vital signs: Reviewed and stable  Last Vitals:  Vitals Value Taken Time  BP 145/54 10/10/24 11:48  Temp 37.3 C 10/10/24 11:50  Pulse 82 10/10/24 11:53  Resp 18 10/10/24 11:53  SpO2 100 % 10/10/24 11:53  Vitals shown include unfiled device data.  Last Pain:  Vitals:   10/10/24 1150  TempSrc: Axillary  PainSc:          Complications: No notable events documented.

## 2024-10-10 NOTE — Progress Notes (Signed)
 Ammonia elevated in 110s and patient now has TIPs which increases risk of hepatic encephalopathy. Will start lactulose . Titrate to three BMs a day.

## 2024-10-10 NOTE — Anesthesia Preprocedure Evaluation (Addendum)
"                                    Anesthesia Evaluation  Patient identified by MRN, date of birth, ID band Patient unresponsive    Reviewed: Allergy & Precautions, Patient's Chart, lab work & pertinent test results, Unable to perform ROS - Chart review only  Airway Mallampati: Intubated  TM Distance: >3 FB Neck ROM: Full    Dental  (+) Dental Advisory Given, Teeth Intact   Pulmonary neg pulmonary ROS   breath sounds clear to auscultation   + intubated    Cardiovascular hypertension, Normal cardiovascular exam+ Valvular Problems/Murmurs  Rhythm:Regular Rate:Normal     Neuro/Psych negative neurological ROS     GI/Hepatic ,GERD  ,,(+) Cirrhosis   Esophageal Varices    , Hepatitis -, Autoimmune  Endo/Other  diabetes    Renal/GU negative Renal ROS     Musculoskeletal negative musculoskeletal ROS (+)    Abdominal  (+) + obese  Peds  Hematology  (+) Blood dyscrasia, anemia   Anesthesia Other Findings   Reproductive/Obstetrics                              Anesthesia Physical Anesthesia Plan  ASA: 4 and emergent  Anesthesia Plan: General   Post-op Pain Management: Minimal or no pain anticipated   Induction: Intravenous, Rapid sequence and Cricoid pressure planned  PONV Risk Score and Plan: 3 and Ondansetron , Dexamethasone  and Treatment may vary due to age or medical condition  Airway Management Planned: Oral ETT  Additional Equipment: Arterial line  Intra-op Plan:   Post-operative Plan: Post-operative intubation/ventilation  Informed Consent: I have reviewed the patients History and Physical, chart, labs and discussed the procedure including the risks, benefits and alternatives for the proposed anesthesia with the patient or authorized representative who has indicated his/her understanding and acceptance.     Consent reviewed with POA and History available from chart only  Plan Discussed with: CRNA  Anesthesia  Plan Comments:          Anesthesia Quick Evaluation  "

## 2024-10-10 NOTE — ED Notes (Signed)
 Called floor to give report, ICU nurse busy at the moment. Will call report again.

## 2024-10-10 NOTE — Addendum Note (Signed)
 Addendum  created 10/10/24 1736 by Darlyn Rush, MD   Clinical Note Signed

## 2024-10-10 NOTE — Consult Note (Signed)
 "     Chief Complaint: Patient was seen in consultation today for UGIB due to gastric varices  Chief Complaint  Patient presents with   Hematemesis   Altered Mental Status   at the request of Albertus Heinz   Referring Physician(s): Albertus, Heinz   Supervising Physician: Jennefer Rover  Patient Status: [facility] - In-pt  History of Present Illness: Susan [name] is a 59 y.o. female with PMHs of HTN, HLD, DM, PBC, cirrhosis, esophageal varices who was admitted due to acute UGIB, found to have gastric varices, IR was consulted for TIPS/BRTO.   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 She was recently admitted in December 2025 due to confusion/altered mental status.   Patient was brought to ED on 10/09/24 by husband yesterday due to episodes of bloody emesis. Lab showed hgb 10.7 (baseline 13,) patient was hemodynamically stable in ED, received blood. CT head was negative and CT A/P with showed  upper abdominal varices and spontaneous left splenorenal shunt. Patient was admitted to ICU and GI was consulted who took the patient for EGD this morning which revealed gastric varices with stigma of bleeding. Esophagus looked normal. IR was consulted for possible interventions, case reviewed and approved for urgent TIPS/BRTO by Dr. Jennefer.   Patient seen in ICU, just came back from endo, remains intubated and sedated.  Husband, Dr. Albertus and RN at bedside.   Benefits and risks of TIPS/BRTO and paracentesis discussed with husband, informed consent obtained.     Past Medical History:  Diagnosis Date   Allergy    Anemia    Cirrhosis (HCC)    Constipation    Contact lens/glasses fitting    wears contacts or glasses   Diabetes mellitus without complication (HCC)    Edema of both lower extremities    Food allergy    Gastric varices    GERD (gastroesophageal reflux disease)    occ pepcid ac   Heart murmur    Hyperlipidemia    Hypertension    Joint pain     Periumbilical hernia    Primary biliary cholangitis (HCC)    AMA negative PBC   Snores    Vitamin D  deficiency     Past Surgical History:  Procedure Laterality Date   BREAST BIOPSY Left 10/02/2023   MM LT BREAST BX W LOC DEV 1ST LESION IMAGE BX SPEC STEREO GUIDE 10/02/2023 [facility]   BREAST BIOPSY Left 10/02/2023   MM LT BREAST BX W LOC DEV EA AD LESION IMG BX SPEC STEREO GUIDE 10/02/2023 [facility]   DILATION AND CURETTAGE OF UTERUS  11/1995, 10/1997   LEEP     LIVER BIOPSY     TUBAL LIGATION  10/1997   UMBILICAL HERNIA REPAIR N/A 08/11/2013   Procedure: HERNIA REPAIR UMBILICAL ADULT;  Surgeon: Lynwood MALVA Pina, MD;  Location: [facility];  Service: General;  Laterality: N/A;    Allergies: Mushroom extract complex (obsolete), Grapefruit concentrate, Mounjaro  [tirzepatide ], Sulfa antibiotics, Amlodipine, Crestor  [rosuvastatin ], Shellfish allergy, Simvastatin , Strawberry extract, and Metformin  and related  Medications: Prior to Admission medications  Medication Sig Start Date End Date Taking? Authorizing Provider  carvedilol  (COREG ) 3.125 MG tablet Take 3.125 mg by mouth 2 (two) times daily.   Yes [provider]  hydrochlorothiazide  (HYDRODIURIL ) 25 MG tablet TAKE 1 TABLET BY MOUTH DAILY 01/07/24  Yes Dugal, Tabitha, FNP  lactulose  (CHRONULAC ) 10 GM/15ML solution Take 45 mLs (30 g total) by mouth 2 (two) times  daily. 08/29/24  Yes Mdala-Gausi, Golden Pillow, MD  spironolactone  (ALDACTONE ) 50 MG tablet Take 1 tablet (50 mg total) by mouth daily. 09/07/24  Yes Dugal, Ginger, FNP  ursodiol  (ACTIGALL ) 300 MG capsule TAKE 2 CAPSULES BY MOUTH TWICE  DAILY 10/22/23  Yes Armbruster, Elspeth SQUIBB, MD     Family History  Problem Relation Age of Onset   Breast cancer Mother    Schizophrenia Mother    Depression Mother    Uterine cancer Mother    Hypertension Mother    Obesity Mother    Renal Disease Father    Diabetes Father    Other Father        Agent Orange    Lung cancer Maternal Grandmother    Lung cancer Maternal Grandfather    Heart disease Maternal Uncle    Diabetes Other        Aunts and Uncles on both sides   Heart disease Maternal Aunt    Colon cancer Neg Hx    Esophageal cancer Neg Hx    Stomach cancer Neg Hx    Rectal cancer Neg Hx     Social History   Socioeconomic History   Marital status: Married    Spouse name: Dwayne   Number of children: 1   Years of education: Not on file   Highest education level: Some college, no degree  Occupational History   Occupation: Interior And Spatial Designer of HR for [employer]  Tobacco Use   Smoking status: Never   Smokeless tobacco: Never  Vaping Use   Vaping status: Never Used  Substance and Sexual Activity   Alcohol use: Yes    Comment: wine 2 times a year   Drug use: No   Sexual activity: Yes    Partners: Male    Birth control/protection: Surgical  Other Topics Concern   Not on file  Social History Narrative   Not on file   Social Drivers of Health   Tobacco Use: Low Risk (10/10/2024)   Patient History    Smoking Tobacco Use: Never    Smokeless Tobacco Use: Never    Passive Exposure: Not on file  Financial Resource Strain: Low Risk (08/17/2024)   Overall Financial Resource Strain (CARDIA)    Difficulty of Paying Living Expenses: Not very hard  Food Insecurity: No Food Insecurity (10/10/2024)   Epic    Worried About Radiation Protection Practitioner of Food in the Last Year: Never true    Ran Out of Food in the Last Year: Never true  Transportation Needs: No Transportation Needs (10/10/2024)   Epic    Lack of Transportation (Medical): No    Lack of Transportation (Non-Medical): No  Physical Activity: Insufficiently Active (08/17/2024)   Exercise Vital Sign    Days of Exercise per Week: 1 day    Minutes of Exercise per Session: 10 min  Stress: No Stress Concern Present (08/17/2024)   [employer] - Occupational Stress Questionnaire    Feeling of Stress: Only a little   Social Connections: Moderately Integrated (08/17/2024)   Social Connection and Isolation Panel    Frequency of Communication with Friends and Family: More than three times a week    Frequency of Social Gatherings with Friends and Family: Once a week    Attends Religious Services: More than 4 times per year    Active Member of [employer] or Organizations: No    Attends Banker Meetings: Not on file    Marital Status: Married  Depression (PHQ2-9):  Medium Risk (08/17/2024)   Depression (PHQ2-9)    PHQ-2 Score: 8  Alcohol Screen: Low Risk (05/07/2023)   Alcohol Screen    Last Alcohol Screening Score (AUDIT): 0  Housing: Low Risk (10/10/2024)   Epic    Unable to Pay for Housing in the Last Year: No    Number of Times Moved in the Last Year: 0    Homeless in the Last Year: No  Utilities: Not At Risk (10/10/2024)   Epic    Threatened with loss of utilities: No  Health Literacy: Not on file     Review of Systems: A 12 point ROS discussed and pertinent positives are indicated in the HPI above.  All other systems are negative.  Vital Signs: BP (!) 111/58   Pulse 74   Temp (!) 97.4 F (36.3 C) (Temporal)   Resp 10   Ht 5' (1.524 m)   Wt 185 lb 10 oz (84.2 kg)   SpO2 99%   BMI 36.25 kg/m    Physical Exam Vitals reviewed.  Constitutional:      Comments: Intubated and sedated   HENT:     Head: Normocephalic and atraumatic.  Cardiovascular:     Rate and Rhythm: Normal rate and regular rhythm.     Heart sounds: Normal heart sounds.  Pulmonary:     Breath sounds: Normal breath sounds.  Abdominal:     General: Abdomen is flat. Bowel sounds are normal.     Palpations: Abdomen is soft.  Musculoskeletal:     Cervical back: Neck supple.  Skin:    General: Skin is warm and dry.     Coloration: Skin is not jaundiced.     MD Evaluation Airway: WNL Heart: WNL Abdomen: WNL Chest/ Lungs: WNL ASA  Classification: 4 Mallampati/Airway Score: Two  Imaging: CT ABDOMEN  PELVIS W CONTRAST Result Date: 10/10/2024 EXAM: CT ABDOMEN AND PELVIS WITH CONTRAST 10/10/2024 01:43:16 AM TECHNIQUE: CT of the abdomen and pelvis was performed with the administration of 75 mL of iohexol  (OMNIPAQUE ) 350 MG/ML injection. Multiplanar reformatted images are provided for review. Automated exposure control, iterative reconstruction, and/or weight-based adjustment of the mA/kV was utilized to reduce the radiation dose to as low as reasonably achievable. COMPARISON: None available. CLINICAL HISTORY: History of cirrhosis. Abdominal pain. Epigastric. Vomiting blood. Elevated lipase. Likely related to esophageal varices. FINDINGS: LOWER CHEST: No acute abnormality. LIVER: Nodular liver compatible with cirrhosis. GALLBLADDER AND BILE DUCTS: Small gallstone noted within the gallbladder. No biliary ductal dilatation. SPLEEN: Normal size. No focal abnormality. PANCREAS: No acute abnormality. ADRENAL GLANDS: No acute abnormality. KIDNEYS, URETERS AND BLADDER: No stones in the kidneys or ureters. No hydronephrosis. No perinephric or periureteral stranding. Urinary bladder is unremarkable. GI AND BOWEL: The stomach is moderately distended with fluid and debris. Normal appendix. Mild wall thickening in the right colon could reflect portal colopathy or colitis. There is no bowel obstruction. PERITONEUM AND RETROPERITONEUM: No ascites. No free air. VASCULATURE: Associated upper abdominal varices. Aorta is normal in caliber. Spontaneous left splenorenal shunt. LYMPH NODES: No lymphadenopathy. REPRODUCTIVE ORGANS: No acute abnormality. BONES AND SOFT TISSUES: No acute osseous abnormality. No focal soft tissue abnormality. IMPRESSION: 1. Nodular liver compatible with cirrhosis, with associated upper abdominal varices and spontaneous left splenorenal shunt. 2. Moderately distended stomach with fluid and debris. 3. Mild wall thickening in the right colon, possibly reflecting portal colopathy or colitis. Electronically  signed by: Franky Crease MD 10/10/2024 01:47 AM EST RP Workstation: [id number]   CT Head Wo Contrast  Result Date: 10/10/2024 EXAM: CT HEAD WITHOUT CONTRAST 10/10/2024 01:43:16 AM TECHNIQUE: CT of the head was performed without the administration of intravenous contrast. Automated exposure control, iterative reconstruction, and/or weight based adjustment of the mA/kV was utilized to reduce the radiation dose to as low as reasonably achievable. COMPARISON: 08/28/2024 CLINICAL HISTORY: Altered mental status FINDINGS: BRAIN AND VENTRICLES: No acute hemorrhage. No evidence of acute infarct. No hydrocephalus. No extra-axial collection. No mass effect or midline shift. ORBITS: No acute abnormality. SINUSES: No acute abnormality. SOFT TISSUES AND SKULL: No acute soft tissue abnormality. No skull fracture. IMPRESSION: 1. No acute intracranial abnormality. Electronically signed by: Franky Stanford MD 10/10/2024 01:45 AM EST RP Workstation: [id number]   MM 3D SCREENING MAMMOGRAM BILATERAL BREAST Result Date: 09/15/2024 CLINICAL DATA:  Screening. EXAM: DIGITAL SCREENING BILATERAL MAMMOGRAM WITH TOMOSYNTHESIS AND CAD TECHNIQUE: Bilateral screening digital craniocaudal and mediolateral oblique mammograms were obtained. Bilateral screening digital breast tomosynthesis was performed. The images were evaluated with computer-aided detection. COMPARISON:  Previous exam(s). ACR Breast Density Category b: There are scattered areas of fibroglandular density. FINDINGS: There are no findings suspicious for malignancy. IMPRESSION: No mammographic evidence of malignancy. A result letter of this screening mammogram will be mailed directly to the patient. RECOMMENDATION: Screening mammogram in one year. (Code:SM-B-01Y) BI-RADS CATEGORY  1: Negative. Electronically Signed   By: Curtistine Noble   On: 09/15/2024 09:59    Labs:  CBC: Recent Labs    09/03/24 1419 10/09/24 2348 10/10/24 0157 10/10/24 0754  WBC 5.3 12.1* 10.2 10.0   HGB 12.9 10.7* 9.2* 10.6*  HCT 38.6 32.3* 27.7* 31.1*  PLT 139.0* 240 207 205    COAGS: Recent Labs    08/28/24 1754 08/29/24 0553 09/03/24 1419 10/10/24 0040  INR 1.4* 1.3* 1.4* 1.1  APTT  --  35  --  24    BMP: Recent Labs    08/28/24 1054 08/28/24 1212 08/29/24 0553 09/03/24 1419 10/09/24 2348 10/10/24 0754  NA 140   < > 140 137 137 137  K 3.6   < > 3.4* 3.8 4.7 4.9  CL 107  --  111 103 102 106  CO2 24  --  21* 30 25 22   GLUCOSE 111*  --  104* 148* 190* 191*  BUN 14  --  13 14 24* 33*  CALCIUM  10.0  --  8.3* 9.6 9.3 8.4*  CREATININE 1.18*  --  0.96 0.94 1.00 1.10*  GFRNONAA 54*  --  >60  --  >60 58*   < > = values in this interval not displayed.    LIVER FUNCTION TESTS: Recent Labs    08/28/24 1054 08/29/24 0553 09/03/24 1419 10/09/24 2348  BILITOT 1.4* 1.5* 1.3* 1.6*  AST 37 34 33 40  ALT 22 19 19 20   ALKPHOS 74 62 75 124  PROT 7.3 5.7* 7.0 6.5  ALBUMIN 3.1* 2.4* 3.4* 3.0*    TUMOR MARKERS: No results for input(s): AFPTM, CEA, CA199, CHROMGRNA in the last 8760 hours.  Assessment and Plan: 59 y.o. female with PBC, cirrhosis, hepatic encephalopathy, currently admitted to ICU due to UGIB secondary to gastric varices, IR will proceed with TIPS/BRTO and possible paracentesis today under the general anesthesia.   NPO since MN VSS, on levophed   CBC hgb 10.6, plt 205  INR 1.1 today RF slightly impaired BUN 33, creatinine 1.1, GFR 58, no hx of renal diseases  AC/AP: none  Allergies reviewed  Abx on rocephin  1g every day, received at 0810 hrs today - will  give additional dose of rocephin  during the procedure, 2g signed and held (under pre-IR)  MELD today (with T bili from yesterday) 10.  Echo on 06/29/24 with normal R heart function   Risks and benefits of TIPS, BRTO and/or additional variceal embolization were discussed with the patient and/or the patient's family including, but not limited to, infection, bleeding, damage to adjacent  structures, worsening hepatic and/or cardiac function, worsening and/or the development of altered mental status/encephalopathy, non-target embolization and death.   This interventional procedure involves the use of X-rays and because of the nature of the planned procedure, it is possible that we will have prolonged use of X-ray fluoroscopy.  Potential radiation risks to you include (but are not limited to) the following: - A slightly elevated risk for cancer  several years later in life. This risk is typically less than 0.5% percent. This risk is low in comparison to the normal incidence of human cancer, which is 33% for women and 50% for men according to the American Cancer Society. - Radiation induced injury can include skin redness, resembling a rash, tissue breakdown / ulcers and hair loss (which can be temporary or permanent).   The likelihood of either of these occurring depends on the difficulty of the procedure and whether you are sensitive to radiation due to previous procedures, disease, or genetic conditions.   IF your procedure requires a prolonged use of radiation, you will be notified and given written instructions for further action.  It is your responsibility to monitor the irradiated area for the 2 weeks following the procedure and to notify your physician if you are concerned that you have suffered a radiation induced injury.    All of the patient's questions were answered, patient is agreeable to proceed.  Consent signed and in chart.  Plan to proceed at 2 pm today. ICU team notified.    Thank you for this interesting consult.  I greatly enjoyed meeting Susan [name] and look forward to participating in their care.  A copy of this report was sent to the requesting provider on this date.  Electronically Signed: Toya VEAR Cousin, PA-C 10/10/2024, 11:33 AM   I spent a total of 40 Minutes    in face to face in clinical consultation, greater than 50% of which was  counseling/coordinating care for TIPC/BRTO.   This chart was dictated using voice recognition software.  Despite best efforts to proofread,  errors can occur which can change the documentation meaning.   "

## 2024-10-10 NOTE — Anesthesia Preprocedure Evaluation (Addendum)
"                                    Anesthesia Evaluation  Patient identified by MRN, date of birth, ID band Patient awake    Reviewed: Allergy & Precautions, NPO status , Patient's Chart, lab work & pertinent test results  Airway Mallampati: III  TM Distance: >3 FB Neck ROM: Full    Dental no notable dental hx. (+) Dental Advisory Given, Teeth Intact   Pulmonary neg pulmonary ROS   Pulmonary exam normal breath sounds clear to auscultation       Cardiovascular hypertension, Normal cardiovascular exam+ Valvular Problems/Murmurs  Rhythm:Regular Rate:Normal     Neuro/Psych negative neurological ROS     GI/Hepatic ,GERD  ,,(+) Cirrhosis   Esophageal Varices    , Hepatitis -, Autoimmune  Endo/Other  diabetes    Renal/GU negative Renal ROS     Musculoskeletal negative musculoskeletal ROS (+)    Abdominal  (+) + obese  Peds  Hematology  (+) Blood dyscrasia, anemia   Anesthesia Other Findings   Reproductive/Obstetrics                              Anesthesia Physical Anesthesia Plan  ASA: 4 and emergent  Anesthesia Plan: General   Post-op Pain Management: Minimal or no pain anticipated   Induction: Intravenous, Rapid sequence and Cricoid pressure planned  PONV Risk Score and Plan: 3 and Ondansetron , Dexamethasone  and Treatment may vary due to age or medical condition  Airway Management Planned: Oral ETT and Video Laryngoscope Planned  Additional Equipment:   Intra-op Plan:   Post-operative Plan: Possible Post-op intubation/ventilation  Informed Consent: I have reviewed the patients History and Physical, chart, labs and discussed the procedure including the risks, benefits and alternatives for the proposed anesthesia with the patient or authorized representative who has indicated his/her understanding and acceptance.     Dental advisory given  Plan Discussed with: CRNA  Anesthesia Plan Comments:           Anesthesia Quick Evaluation  "

## 2024-10-10 NOTE — Anesthesia Postprocedure Evaluation (Addendum)
"   Anesthesia Post Note  Patient: Susan [name]  Procedure(s) Performed: RADIOLOGY WITH ANESTHESIA EGD (ESOPHAGOGASTRODUODENOSCOPY)     Patient location during evaluation: ICU Anesthesia Type: General Level of consciousness: sedated and patient remains intubated per anesthesia plan Pain management: pain level controlled Vital Signs Assessment: post-procedure vital signs reviewed and stable Respiratory status: spontaneous breathing, patient on ventilator - see flowsheet for VS and patient remains intubated per anesthesia plan Cardiovascular status: stable Anesthetic complications: no   No notable events documented.  Last Vitals:  Vitals:   10/10/24 1215 10/10/24 1230  BP: 123/60 (!) 116/59  Pulse: 81 81  Resp: 20 (!) 24  Temp:    SpO2: 100% 100%    Last Pain:  Vitals:   10/10/24 1150  TempSrc: Axillary  PainSc:                  Susan [name]      "

## 2024-10-10 NOTE — ED Provider Notes (Signed)
 " [place] [facility] AT [facility] Provider Note   CSN: [id number] Arrival date & time: 10/09/24  2320     Patient presents with: Hematemesis and Altered Mental Status   Susan [name] is a 59 y.o. female.    Altered Mental Status Associated symptoms: no abdominal pain, no fever, no palpitations, no rash, no seizures and no vomiting    Presents because of weakness, emesis.  According to family with husband at bedside, patient was eating dinner when she started feeling nauseous.  Had a couple episodes of emesis while at home.  Noticed blood in emesis.  Patient states that she felt very weak during this time.  She states that she was sitting the commode at 1 point time tried to have a bowel movement she felt very lightheaded.  Husband came in and noticed that she was acting altered and subsequently guided her to the floor.  She did not hit her head.  Did not lose consciousness.  Patient was just acting altered at that point time.  Patient states that she is feeling much better now but did have 1 episode of emesis while in triage.  Patient states that she vomited what looks like a large amount of blood.  She states that she never had any kind of banding for esophageal varices.  Denies any abdominal pain this time.  Bowel moods been regular.  Still passing flatulence.  No chest pain or shortness of breath.  No fever no chills  Previous medical history reviewed : Patient last admitted and discharged in December 2025.  Hepatic encephalopathy.  Known history of gastric varices.  Iron deficient anemia due to chronic blood loss.  History of autoimmune liver cirrhosis.  Have presented because confusion.  Elevated ammonia 75.     Prior to Admission medications  Medication Sig Start Date End Date Taking? Authorizing Provider  carvedilol  (COREG ) 3.125 MG tablet Take 3.125 mg by mouth 2 (two) times daily.   Yes [provider]  hydrochlorothiazide  (HYDRODIURIL ) 25 MG  tablet TAKE 1 TABLET BY MOUTH DAILY 01/07/24  Yes Dugal, Tabitha, FNP  lactulose  (CHRONULAC ) 10 GM/15ML solution Take 45 mLs (30 g total) by mouth 2 (two) times daily. 08/29/24  Yes Mdala-Gausi, Masiku Agatha, MD  spironolactone  (ALDACTONE ) 50 MG tablet Take 1 tablet (50 mg total) by mouth daily. 09/07/24  Yes Dugal, Ginger, FNP  ursodiol  (ACTIGALL ) 300 MG capsule TAKE 2 CAPSULES BY MOUTH TWICE  DAILY 10/22/23  Yes Armbruster, Elspeth SQUIBB, MD    Allergies: Mushroom extract complex (obsolete), Grapefruit concentrate, Mounjaro  [tirzepatide ], Sulfa antibiotics, Amlodipine, Crestor  [rosuvastatin ], Shellfish allergy, Simvastatin , Strawberry extract, and Metformin  and related    Review of Systems  Constitutional:  Negative for chills and fever.  HENT:  Negative for ear pain and sore throat.   Eyes:  Negative for pain and visual disturbance.  Respiratory:  Negative for cough and shortness of breath.   Cardiovascular:  Negative for chest pain and palpitations.  Gastrointestinal:  Negative for abdominal pain and vomiting.  Genitourinary:  Negative for dysuria and hematuria.  Musculoskeletal:  Negative for arthralgias and back pain.  Skin:  Negative for color change and rash.  Neurological:  Negative for seizures and syncope.  All other systems reviewed and are negative.   Updated Vital Signs BP (!) 98/53   Pulse 81   Temp 97.8 F (36.6 C) (Oral)   Resp 19   Ht 5' (1.524 m)   Wt 83 kg   SpO2 100%  BMI 35.74 kg/m   Physical Exam Vitals and nursing note reviewed.  Constitutional:      General: She is not in acute distress.    Appearance: She is well-developed.  HENT:     Head: Normocephalic and atraumatic.  Eyes:     Conjunctiva/sclera: Conjunctivae normal.  Cardiovascular:     Rate and Rhythm: Normal rate and regular rhythm.     Heart sounds: No murmur heard. Pulmonary:     Effort: Pulmonary effort is normal. No respiratory distress.     Breath sounds: Normal breath sounds.   Abdominal:     Palpations: Abdomen is soft.     Tenderness: There is no abdominal tenderness.  Musculoskeletal:        General: No swelling.     Cervical back: Neck supple.  Skin:    General: Skin is warm and dry.     Capillary Refill: Capillary refill takes less than 2 seconds.  Neurological:     Mental Status: She is alert.  Psychiatric:        Mood and Affect: Mood normal.     (all labs ordered are listed, but only abnormal results are displayed) Labs Reviewed  LIPASE, BLOOD - Abnormal; Notable for the following components:      Result Value   Lipase 84 (*)    All other components within normal limits  COMPREHENSIVE METABOLIC PANEL WITH GFR - Abnormal; Notable for the following components:   Glucose, Bld 190 (*)    BUN 24 (*)    Albumin 3.0 (*)    Total Bilirubin 1.6 (*)    All other components within normal limits  CBC - Abnormal; Notable for the following components:   WBC 12.1 (*)    RBC 3.46 (*)    Hemoglobin 10.7 (*)    HCT 32.3 (*)    All other components within normal limits  CBC - Abnormal; Notable for the following components:   RBC 2.94 (*)    Hemoglobin 9.2 (*)    HCT 27.7 (*)    All other components within normal limits  MRSA NEXT GEN BY PCR, NASAL  PROTIME-INR  APTT  URINALYSIS, ROUTINE W REFLEX MICROSCOPIC  AMMONIA  CBC WITH DIFFERENTIAL/PLATELET  CBC WITH DIFFERENTIAL/PLATELET  CBC WITH DIFFERENTIAL/PLATELET  BASIC METABOLIC PANEL WITH GFR  POC OCCULT BLOOD, ED  TYPE AND SCREEN  ABO/RH  PREPARE RBC (CROSSMATCH)    EKG: EKG Interpretation Date/Time:  Friday October 09 2024 23:29:47 EST Ventricular Rate:  73 PR Interval:  150 QRS Duration:  78 QT Interval:  432 QTC Calculation: 475 R Axis:   22  Text Interpretation: Normal sinus rhythm Normal ECG When compared with ECG of 28-Aug-2024 10:37, PREVIOUS ECG IS PRESENT Confirmed by Simon Rea [phone]) on 10/10/2024 12:18:39 AM  Radiology: CT ABDOMEN PELVIS W CONTRAST Result Date:  10/10/2024 EXAM: CT ABDOMEN AND PELVIS WITH CONTRAST 10/10/2024 01:43:16 AM TECHNIQUE: CT of the abdomen and pelvis was performed with the administration of 75 mL of iohexol  (OMNIPAQUE ) 350 MG/ML injection. Multiplanar reformatted images are provided for review. Automated exposure control, iterative reconstruction, and/or weight-based adjustment of the mA/kV was utilized to reduce the radiation dose to as low as reasonably achievable. COMPARISON: None available. CLINICAL HISTORY: History of cirrhosis. Abdominal pain. Epigastric. Vomiting blood. Elevated lipase. Likely related to esophageal varices. FINDINGS: LOWER CHEST: No acute abnormality. LIVER: Nodular liver compatible with cirrhosis. GALLBLADDER AND BILE DUCTS: Small gallstone noted within the gallbladder. No biliary ductal dilatation. SPLEEN: Normal size. No focal  abnormality. PANCREAS: No acute abnormality. ADRENAL GLANDS: No acute abnormality. KIDNEYS, URETERS AND BLADDER: No stones in the kidneys or ureters. No hydronephrosis. No perinephric or periureteral stranding. Urinary bladder is unremarkable. GI AND BOWEL: The stomach is moderately distended with fluid and debris. Normal appendix. Mild wall thickening in the right colon could reflect portal colopathy or colitis. There is no bowel obstruction. PERITONEUM AND RETROPERITONEUM: No ascites. No free air. VASCULATURE: Associated upper abdominal varices. Aorta is normal in caliber. Spontaneous left splenorenal shunt. LYMPH NODES: No lymphadenopathy. REPRODUCTIVE ORGANS: No acute abnormality. BONES AND SOFT TISSUES: No acute osseous abnormality. No focal soft tissue abnormality. IMPRESSION: 1. Nodular liver compatible with cirrhosis, with associated upper abdominal varices and spontaneous left splenorenal shunt. 2. Moderately distended stomach with fluid and debris. 3. Mild wall thickening in the right colon, possibly reflecting portal colopathy or colitis. Electronically signed by: Franky Crease MD  10/10/2024 01:47 AM EST RP Workstation: [id number]   CT Head Wo Contrast Result Date: 10/10/2024 EXAM: CT HEAD WITHOUT CONTRAST 10/10/2024 01:43:16 AM TECHNIQUE: CT of the head was performed without the administration of intravenous contrast. Automated exposure control, iterative reconstruction, and/or weight based adjustment of the mA/kV was utilized to reduce the radiation dose to as low as reasonably achievable. COMPARISON: 08/28/2024 CLINICAL HISTORY: Altered mental status FINDINGS: BRAIN AND VENTRICLES: No acute hemorrhage. No evidence of acute infarct. No hydrocephalus. No extra-axial collection. No mass effect or midline shift. ORBITS: No acute abnormality. SINUSES: No acute abnormality. SOFT TISSUES AND SKULL: No acute soft tissue abnormality. No skull fracture. IMPRESSION: 1. No acute intracranial abnormality. Electronically signed by: Franky Stanford MD 10/10/2024 01:45 AM EST RP Workstation: [id number]     Procedures   Medications Ordered in the ED  octreotide  (SANDOSTATIN ) 2 mcg/mL load via infusion 50 mcg (50 mcg Intravenous Bolus from Bag 10/10/24 0148)    And  octreotide  (SANDOSTATIN ) 500 mcg in sodium chloride  0.9 % 250 mL (2 mcg/mL) infusion (50 mcg/hr Intravenous New Bag/Given 10/10/24 0148)  0.9 %  sodium chloride  infusion (Manually program via Guardrails IV Fluids) (has no administration in time range)  Chlorhexidine  Gluconate Cloth 2 % PADS 6 each (has no administration in time range)  polyethylene glycol (MIRALAX  / GLYCOLAX ) packet 17 g (has no administration in time range)  senna (SENOKOT) tablet 8.6 mg (has no administration in time range)  pantoprazole  (PROTONIX ) injection 40 mg (has no administration in time range)  insulin  aspart (novoLOG ) injection 0-9 Units (has no administration in time range)  pantoprazole  (PROTONIX ) injection 40 mg (40 mg Intravenous Given 10/10/24 0051)  sodium chloride  0.9 % bolus 1,000 mL (0 mLs Intravenous Stopped 10/10/24 0239)  iohexol  (OMNIPAQUE )  350 MG/ML injection 75 mL (75 mLs Intravenous Contrast Given 10/10/24 0143)  ondansetron  (ZOFRAN ) injection 4 mg (4 mg Intravenous Given 10/10/24 0200)  sodium chloride  0.9 % bolus 1,000 mL (0 mLs Intravenous Stopped 10/10/24 0325)    Clinical Course as of 10/10/24 0328  Sat Oct 10, 2024  0055 Octreotide  infusion. GI will see overnight  [TL]    Clinical Course User Index [TL] Simon Lavonia SAILOR, MD                                 Medical Decision Making Amount and/or Complexity of Data Reviewed Labs: ordered. Radiology: ordered.  Risk Prescription drug management. Decision regarding hospitalization.     HPI:  Presents because of weakness, emesis.  According to family with  husband at bedside, patient was eating dinner when she started feeling nauseous.  Had a couple episodes of emesis while at home.  Noticed blood in emesis.  Patient states that she felt very weak during this time.  She states that she was sitting the commode at 1 point time tried to have a bowel movement she felt very lightheaded.  Husband came in and noticed that she was acting altered and subsequently guided her to the floor.  She did not hit her head.  Did not lose consciousness.  Patient was just acting altered at that point time.  Patient states that she is feeling much better now but did have 1 episode of emesis while in triage.  Patient states that she vomited what looks like a large amount of blood.  She states that she never had any kind of banding for esophageal varices.  Denies any abdominal pain this time.  Bowel moods been regular.  Still passing flatulence.  No chest pain or shortness of breath.  No fever no chills  Previous medical history reviewed : Patient last admitted and discharged in December 2025.  Hepatic encephalopathy.  Known history of gastric varices.  Iron deficient anemia due to chronic blood loss.  History of autoimmune liver cirrhosis.  Have presented because confusion.  Elevated ammonia  75.  Follows up with [facility] gastroenterology.  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  Patient is currently on ursodiol .  Carvedilol  for portal hypertension as well.  MDM:   Upon examination, patient hemodynamically stable. A&O x 3 with GCS 15.   Patient currently has a soft benign.  Passing flatulence.  Passing stool.  No concerns for gastric perforation.  I think the bloody vomit that she is experiencing likely from her esophageal varices.  Will obtain CBC to check hemoglobin level.  Also will obtain ammonia to see if ammonia given patient's recent admission for similar presentation.  Obtain LFTs.   Reevaluation:   Upon reexamination, patient hemodynamically stable.  Remains A&O x 3 with GCS 15.   Consulted [place] [facility] gastroenterology.  Appreciate recommendations.  Dr. Albertus recommended octreotide  load and infusion.  He agreed that there is no indication for emergent EGD but will likely see in the morning and consider for EGD tomorrow.  I will keep the team in the loop on the patient is in the ED if she starts having increasing bleeding then may need a more emergent EGD.   Patient did have another episode of hematemesis.  Significant amount.  Obtain repeat CBC.  Repeat CBC shows significant drop.  Hemo Globin down to 9.2.  Came in at 10.7.  Baseline around 13.  Hemodynamically stable still.  But given the dramatic drop as well as repeat episode of hematemesis, I did consent her for blood.  I do think the benefits of blood outweigh the risk in the situation given likely variceal bleed.  Patient and husband at bedside and agreed to consent for blood.  I did obtain CT head as well given the possible altered mental status.  This is normal.  Neuro intact.  Probably multifactorial.  Pending ammonia  level.  Will be admitted to the ICU.  Around time of transfer to the ICU.  Patient became slightly hypotensive.  Maps around 65.  Patient had not received blood yet.  Asked nursing staff to go ahead and hang the unit  of blood given that it was in the room.  Patient mentating well.    Interventions: 1 u PRBC, 2 L NS   EKG Interpreted by Me: sinus    Cardiac Tele Interpreted by Me: sinus    I have independently interpreted the  CT  images and agree with the radiologist finding   Social Determinant of Health: no alcohol abuse    Disposition and Follow Up: ICU    CRITICAL CARE Performed by: Lavonia LOISE Pat   Total critical care time: 44 minutes  Critical care time was exclusive of separately billable procedures and treating other patients.  Critical care was necessary to treat or prevent imminent or life-threatening deterioration.  Critical care was time spent personally by me on the following activities: development of treatment plan with patient and/or surrogate as well as nursing, discussions with consultants, evaluation of patient's response to treatment, examination of patient, obtaining history from patient or surrogate, ordering and performing treatments and interventions, ordering and review of laboratory studies, ordering and review of radiographic studies, pulse oximetry and re-evaluation of patient's condition.        Final diagnoses:  Hematemesis with nausea  Upper GI bleed    ED Discharge Orders     None          Pat Lavonia LOISE, MD 10/10/24 [phone]  "

## 2024-10-10 NOTE — Anesthesia Procedure Notes (Addendum)
 Arterial Line Insertion Start/End1/17/2026 12:50 PM, 10/10/2024 12:56 PM Performed by: Darlyn Rush, MD  Patient location: OR. Preanesthetic checklist: patient identified, IV checked, site marked, risks and benefits discussed, surgical consent, monitors and equipment checked, pre-op evaluation, timeout performed and anesthesia consent Patient sedated Left, radial was placed Catheter size: 20 G Hand hygiene performed , maximum sterile barriers used  and Seldinger technique used  Attempts: 2 Procedure performed using ultrasound to evaluate access site. Ultrasound Notes:relevant anatomy identified, ultrasound used to visualize needle entry, vessel patent under ultrasound and image(s) printed for medical record. Following insertion, dressing applied and Biopatch. Post procedure complications: unsuccessful attempts and second provider assisted. Patient tolerated the procedure well with no immediate complications. Additional procedure comments: Attempt by CRNA x1. Successful insertion with US  by MD. .

## 2024-10-10 NOTE — Sedation Documentation (Signed)
 PAP pressure 13

## 2024-10-10 NOTE — Plan of Care (Signed)

## 2024-10-11 ENCOUNTER — Other Ambulatory Visit: Payer: Self-pay | Admitting: Student

## 2024-10-11 ENCOUNTER — Inpatient Hospital Stay (HOSPITAL_COMMUNITY)

## 2024-10-11 DIAGNOSIS — K7682 Hepatic encephalopathy: Secondary | ICD-10-CM | POA: Diagnosis not present

## 2024-10-11 DIAGNOSIS — K3189 Other diseases of stomach and duodenum: Secondary | ICD-10-CM

## 2024-10-11 DIAGNOSIS — K766 Portal hypertension: Secondary | ICD-10-CM | POA: Diagnosis not present

## 2024-10-11 DIAGNOSIS — N179 Acute kidney failure, unspecified: Secondary | ICD-10-CM | POA: Diagnosis not present

## 2024-10-11 DIAGNOSIS — K7290 Hepatic failure, unspecified without coma: Secondary | ICD-10-CM

## 2024-10-11 DIAGNOSIS — I864 Gastric varices: Secondary | ICD-10-CM | POA: Diagnosis not present

## 2024-10-11 DIAGNOSIS — J9601 Acute respiratory failure with hypoxia: Secondary | ICD-10-CM | POA: Diagnosis not present

## 2024-10-11 DIAGNOSIS — K920 Hematemesis: Secondary | ICD-10-CM | POA: Diagnosis not present

## 2024-10-11 DIAGNOSIS — E7220 Disorder of urea cycle metabolism, unspecified: Secondary | ICD-10-CM | POA: Diagnosis not present

## 2024-10-11 DIAGNOSIS — R578 Other shock: Secondary | ICD-10-CM | POA: Diagnosis not present

## 2024-10-11 DIAGNOSIS — K7460 Unspecified cirrhosis of liver: Secondary | ICD-10-CM

## 2024-10-11 DIAGNOSIS — K922 Gastrointestinal hemorrhage, unspecified: Secondary | ICD-10-CM | POA: Diagnosis not present

## 2024-10-11 LAB — CBC WITH DIFFERENTIAL/PLATELET
Abs Immature Granulocytes: 0.93 K/uL — ABNORMAL HIGH (ref 0.00–0.07)
Abs Immature Granulocytes: 0.57 K/uL — ABNORMAL HIGH (ref 0.00–0.07)
Abs Immature Granulocytes: 0.57 K/uL — ABNORMAL HIGH (ref 0.00–0.07)
Basophils Absolute: 0.1 K/uL (ref 0.0–0.1)
Basophils Absolute: 0.1 K/uL (ref 0.0–0.1)
Basophils Absolute: 0.1 K/uL (ref 0.0–0.1)
Basophils Relative: 0 %
Basophils Relative: 0 %
Basophils Relative: 0 %
Eosinophils Absolute: 0 K/uL (ref 0.0–0.5)
Eosinophils Absolute: 0 K/uL (ref 0.0–0.5)
Eosinophils Absolute: 0 K/uL (ref 0.0–0.5)
Eosinophils Relative: 0 %
Eosinophils Relative: 0 %
Eosinophils Relative: 0 %
HCT: 25.8 % — ABNORMAL LOW (ref 36.0–46.0)
HCT: 24.3 % — ABNORMAL LOW (ref 36.0–46.0)
HCT: 22.9 % — ABNORMAL LOW (ref 36.0–46.0)
Hemoglobin: 8.8 g/dL — ABNORMAL LOW (ref 12.0–15.0)
Hemoglobin: 8.4 g/dL — ABNORMAL LOW (ref 12.0–15.0)
Hemoglobin: 8 g/dL — ABNORMAL LOW (ref 12.0–15.0)
Immature Granulocytes: 4 %
Immature Granulocytes: 4 %
Immature Granulocytes: 3 %
Lymphocytes Relative: 15 %
Lymphocytes Relative: 15 %
Lymphocytes Relative: 14 %
Lymphs Abs: 3.1 K/uL (ref 0.7–4.0)
Lymphs Abs: 2.4 K/uL (ref 0.7–4.0)
Lymphs Abs: 2.3 K/uL (ref 0.7–4.0)
MCH: 31.2 pg (ref 26.0–34.0)
MCH: 31.3 pg (ref 26.0–34.0)
MCH: 31.7 pg (ref 26.0–34.0)
MCHC: 34.1 g/dL (ref 30.0–36.0)
MCHC: 34.6 g/dL (ref 30.0–36.0)
MCHC: 34.9 g/dL (ref 30.0–36.0)
MCV: 91.5 fL (ref 80.0–100.0)
MCV: 90.7 fL (ref 80.0–100.0)
MCV: 90.9 fL (ref 80.0–100.0)
Monocytes Absolute: 2.7 K/uL — ABNORMAL HIGH (ref 0.1–1.0)
Monocytes Absolute: 2.1 K/uL — ABNORMAL HIGH (ref 0.1–1.0)
Monocytes Absolute: 2.3 K/uL — ABNORMAL HIGH (ref 0.1–1.0)
Monocytes Relative: 13 %
Monocytes Relative: 13 %
Monocytes Relative: 13 %
Neutro Abs: 14.5 K/uL — ABNORMAL HIGH (ref 1.7–7.7)
Neutro Abs: 11.3 K/uL — ABNORMAL HIGH (ref 1.7–7.7)
Neutro Abs: 11.6 K/uL — ABNORMAL HIGH (ref 1.7–7.7)
Neutrophils Relative %: 68 %
Neutrophils Relative %: 68 %
Neutrophils Relative %: 70 %
Platelets: 139 K/uL — ABNORMAL LOW (ref 150–400)
Platelets: 103 K/uL — ABNORMAL LOW (ref 150–400)
Platelets: 97 K/uL — ABNORMAL LOW (ref 150–400)
RBC: 2.82 MIL/uL — ABNORMAL LOW (ref 3.87–5.11)
RBC: 2.68 MIL/uL — ABNORMAL LOW (ref 3.87–5.11)
RBC: 2.52 MIL/uL — ABNORMAL LOW (ref 3.87–5.11)
RDW: 14.1 % (ref 11.5–15.5)
RDW: 13.8 % (ref 11.5–15.5)
RDW: 13.7 % (ref 11.5–15.5)
WBC: 21.3 K/uL — ABNORMAL HIGH (ref 4.0–10.5)
WBC: 16.5 K/uL — ABNORMAL HIGH (ref 4.0–10.5)
WBC: 16.9 K/uL — ABNORMAL HIGH (ref 4.0–10.5)
nRBC: 1.1 % — ABNORMAL HIGH (ref 0.0–0.2)
nRBC: 1 % — ABNORMAL HIGH (ref 0.0–0.2)
nRBC: 1.3 % — ABNORMAL HIGH (ref 0.0–0.2)

## 2024-10-11 LAB — COMPREHENSIVE METABOLIC PANEL WITH GFR
ALT: 31 U/L (ref 0–44)
AST: 64 U/L — ABNORMAL HIGH (ref 15–41)
Albumin: 2.7 g/dL — ABNORMAL LOW (ref 3.5–5.0)
Alkaline Phosphatase: 94 U/L (ref 38–126)
Anion gap: 13 (ref 5–15)
BUN: 62 mg/dL — ABNORMAL HIGH (ref 6–20)
CO2: 19 mmol/L — ABNORMAL LOW (ref 22–32)
Calcium: 8 mg/dL — ABNORMAL LOW (ref 8.9–10.3)
Chloride: 106 mmol/L (ref 98–111)
Creatinine, Ser: 2.3 mg/dL — ABNORMAL HIGH (ref 0.44–1.00)
GFR, Estimated: 24 mL/min — ABNORMAL LOW
Glucose, Bld: 230 mg/dL — ABNORMAL HIGH (ref 70–99)
Potassium: 4.7 mmol/L (ref 3.5–5.1)
Sodium: 137 mmol/L (ref 135–145)
Total Bilirubin: 0.6 mg/dL (ref 0.0–1.2)
Total Protein: 5.6 g/dL — ABNORMAL LOW (ref 6.5–8.1)

## 2024-10-11 LAB — PROTIME-INR
INR: 1.3 — ABNORMAL HIGH (ref 0.8–1.2)
Prothrombin Time: 16.8 s — ABNORMAL HIGH (ref 11.4–15.2)

## 2024-10-11 LAB — GLUCOSE, CAPILLARY
Glucose-Capillary: 234 mg/dL — ABNORMAL HIGH (ref 70–99)
Glucose-Capillary: 229 mg/dL — ABNORMAL HIGH (ref 70–99)
Glucose-Capillary: 208 mg/dL — ABNORMAL HIGH (ref 70–99)
Glucose-Capillary: 193 mg/dL — ABNORMAL HIGH (ref 70–99)
Glucose-Capillary: 178 mg/dL — ABNORMAL HIGH (ref 70–99)
Glucose-Capillary: 190 mg/dL — ABNORMAL HIGH (ref 70–99)

## 2024-10-11 LAB — TRIGLYCERIDES: Triglycerides: 273 mg/dL — ABNORMAL HIGH

## 2024-10-11 MED ORDER — ZINC SULFATE 220 (50 ZN) MG PO CAPS
220.0000 mg | ORAL_CAPSULE | Freq: Two times a day (BID) | ORAL | Status: DC
  Administered 2024-10-11 – 2024-10-22 (×22): 220 mg via ORAL
  Filled 2024-10-11 (×22): qty 1

## 2024-10-11 MED ORDER — ACETAMINOPHEN 325 MG PO TABS
650.0000 mg | ORAL_TABLET | Freq: Four times a day (QID) | ORAL | Status: DC | PRN
  Administered 2024-10-11 – 2024-10-16 (×3): 650 mg via ORAL
  Filled 2024-10-11 (×3): qty 2

## 2024-10-11 MED ORDER — RIFAXIMIN 550 MG PO TABS
550.0000 mg | ORAL_TABLET | Freq: Two times a day (BID) | ORAL | Status: DC
  Administered 2024-10-11 – 2024-10-22 (×22): 550 mg via ORAL
  Filled 2024-10-11 (×22): qty 1

## 2024-10-11 MED ORDER — HYDROMORPHONE HCL 1 MG/ML IJ SOLN
0.5000 mg | INTRAMUSCULAR | Status: DC | PRN
  Administered 2024-10-12 – 2024-10-20 (×12): 0.5 mg via INTRAVENOUS
  Filled 2024-10-11 (×2): qty 0.5
  Filled 2024-10-11: qty 1
  Filled 2024-10-11 (×2): qty 0.5
  Filled 2024-10-11: qty 1
  Filled 2024-10-11 (×7): qty 0.5

## 2024-10-11 MED ORDER — PANTOPRAZOLE SODIUM 40 MG IV SOLR
40.0000 mg | INTRAVENOUS | Status: DC
  Administered 2024-10-12 – 2024-10-18 (×7): 40 mg via INTRAVENOUS
  Filled 2024-10-11 (×7): qty 10

## 2024-10-11 NOTE — Plan of Care (Signed)
  Problem: Metabolic: Goal: Ability to maintain appropriate glucose levels will improve Outcome: Progressing   Problem: Nutritional: Goal: Maintenance of adequate nutrition will improve Outcome: Progressing   Problem: Skin Integrity: Goal: Risk for impaired skin integrity will decrease Outcome: Progressing   Problem: Education: Goal: Knowledge of General Education information will improve Description: Including pain rating scale, medication(s)/side effects and non-pharmacologic comfort measures Outcome: Progressing

## 2024-10-11 NOTE — Progress Notes (Addendum)
 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 Jennefer on 10/10/24.    Subjective:  Patient sitting in bed, sleeping. Husband at bedside.  Husband states that the patient has been sleeping and she seems doing ok.   Allergies: Mushroom extract complex (obsolete), Grapefruit concentrate, Mounjaro  [tirzepatide ], Sulfa antibiotics, Amlodipine, Crestor  [rosuvastatin ], Shellfish allergy, Simvastatin , Strawberry extract, and Metformin  and related  Medications: Prior to Admission medications  Medication Sig Start Date End Date Taking? Authorizing Provider  carvedilol  (COREG ) 3.125 MG tablet Take 3.125 mg by mouth 2 (two) times daily.   Yes [provider]  hydrochlorothiazide  (HYDRODIURIL ) 25 MG tablet TAKE 1 TABLET BY MOUTH DAILY 01/07/24  Yes Dugal, Tabitha, FNP  lactulose  (CHRONULAC ) 10 GM/15ML solution Take 45 mLs (30 g total) by mouth 2 (two) times daily. 08/29/24  Yes Mdala-Gausi, Masiku Agatha, MD  spironolactone  (ALDACTONE ) 50 MG tablet Take 1 tablet (50 mg total) by mouth daily. 09/07/24  Yes Dugal, Ginger, FNP  ursodiol  (ACTIGALL ) 300 MG capsule TAKE 2 CAPSULES BY MOUTH TWICE  DAILY 10/22/23  Yes Armbruster, Elspeth SQUIBB, MD     Vital Signs: BP (!) 110/58   Pulse 91   Temp 98.8 F (37.1 C) (Axillary)   Resp (!) 22   Ht 5' (1.524 m)   Wt 188 lb 7.9 oz (85.5 kg)   SpO2 100%   BMI 36.81 kg/m   Physical Exam Vitals reviewed.  Constitutional:      Comments: Sleeping, can open eyes and nod head   HENT:     Head: Normocephalic and atraumatic.  Neck:     Comments: + puncture site in RIJ, no dressing. Appears dry and clean, almost fully healed.  Pulmonary:     Effort: Pulmonary effort is normal.  Abdominal:     General: Abdomen is flat.     Palpations:  Abdomen is soft.  Genitourinary:    Comments: Old, bloody BM noted. Skin:    General: Skin is warm and dry.     Coloration: Skin is not jaundiced or pale.     Imaging: DG CHEST PORT 1 VIEW Result Date: 10/11/2024 EXAM: 1 VIEW(S) XRAY OF THE CHEST 10/11/2024 07:46:00 AM COMPARISON: 08/28/2024 CLINICAL HISTORY: Hypoxia. FINDINGS: LINES, TUBES AND DEVICES: Endotracheal tube in place with tip 3.1 cm above the carina. LUNGS AND PLEURA: Mildly decreased lung volumes. Mild left basilar linear subsegmental atelectasis. No pleural effusion. No pneumothorax. HEART AND MEDIASTINUM: No acute abnormality of the cardiac and mediastinal silhouettes. BONES AND SOFT TISSUES: No acute osseous abnormality. Left upper quadrant embolization coils noted. IMPRESSION: 1. Endotracheal tube tip 3.1 cm above the carina. 2. Mild left basilar linear subsegmental atelectasis. 3. Left upper quadrant embolization coils. Electronically signed by: Evalene Coho MD 10/11/2024 07:50 AM EST RP Workstation: [id number]   CT ABDOMEN PELVIS W CONTRAST Result Date: 10/10/2024 EXAM: CT ABDOMEN AND PELVIS WITH CONTRAST 10/10/2024 01:43:16 AM TECHNIQUE: CT of the abdomen and pelvis was performed with the administration of 75 mL of iohexol  (OMNIPAQUE ) 350 MG/ML injection. Multiplanar reformatted images are provided for review. Automated exposure control, iterative reconstruction, and/or weight-based adjustment of the mA/kV was utilized to reduce the radiation dose to as low as reasonably achievable. COMPARISON: None available. CLINICAL HISTORY: History of cirrhosis. Abdominal pain. Epigastric. Vomiting blood. Elevated lipase. Likely related  to esophageal varices. FINDINGS: LOWER CHEST: No acute abnormality. LIVER: Nodular liver compatible with cirrhosis. GALLBLADDER AND BILE DUCTS: Small gallstone noted within the gallbladder. No biliary ductal dilatation. SPLEEN: Normal size. No focal abnormality. PANCREAS: No acute abnormality. ADRENAL  GLANDS: No acute abnormality. KIDNEYS, URETERS AND BLADDER: No stones in the kidneys or ureters. No hydronephrosis. No perinephric or periureteral stranding. Urinary bladder is unremarkable. GI AND BOWEL: The stomach is moderately distended with fluid and debris. Normal appendix. Mild wall thickening in the right colon could reflect portal colopathy or colitis. There is no bowel obstruction. PERITONEUM AND RETROPERITONEUM: No ascites. No free air. VASCULATURE: Associated upper abdominal varices. Aorta is normal in caliber. Spontaneous left splenorenal shunt. LYMPH NODES: No lymphadenopathy. REPRODUCTIVE ORGANS: No acute abnormality. BONES AND SOFT TISSUES: No acute osseous abnormality. No focal soft tissue abnormality. IMPRESSION: 1. Nodular liver compatible with cirrhosis, with associated upper abdominal varices and spontaneous left splenorenal shunt. 2. Moderately distended stomach with fluid and debris. 3. Mild wall thickening in the right colon, possibly reflecting portal colopathy or colitis. Electronically signed by: Franky Crease MD 10/10/2024 01:47 AM EST RP Workstation: [id number]   CT Head Wo Contrast Result Date: 10/10/2024 EXAM: CT HEAD WITHOUT CONTRAST 10/10/2024 01:43:16 AM TECHNIQUE: CT of the head was performed without the administration of intravenous contrast. Automated exposure control, iterative reconstruction, and/or weight based adjustment of the mA/kV was utilized to reduce the radiation dose to as low as reasonably achievable. COMPARISON: 08/28/2024 CLINICAL HISTORY: Altered mental status FINDINGS: BRAIN AND VENTRICLES: No acute hemorrhage. No evidence of acute infarct. No hydrocephalus. No extra-axial collection. No mass effect or midline shift. ORBITS: No acute abnormality. SINUSES: No acute abnormality. SOFT TISSUES AND SKULL: No acute soft tissue abnormality. No skull fracture. IMPRESSION: 1. No acute intracranial abnormality. Electronically signed by: Franky Stanford MD 10/10/2024 01:45 AM  EST RP Workstation: [id number]    Labs:  CBC: Recent Labs    10/09/24 2348 10/10/24 0157 10/10/24 0754 10/10/24 1228 10/10/24 1949  WBC 12.1* 10.2 10.0  --  22.9*  HGB 10.7* 9.2* 10.6* 9.9* 10.0*  HCT 32.3* 27.7* 31.1* 29.0* 28.9*  PLT 240 207 205  --  224    COAGS: Recent Labs    08/28/24 1754 08/29/24 0553 09/03/24 1419 10/10/24 0040  INR 1.4* 1.3* 1.4* 1.1  APTT  --  35  --  24    BMP: Recent Labs    08/29/24 0553 09/03/24 1419 10/09/24 2348 10/10/24 0754 10/10/24 1228 10/10/24 1949  NA 140 137 137 137 138 136  K 3.4* 3.8 4.7 4.9 5.5* 4.9  CL 111 103 102 106  --  106  CO2 21* 30 25 22   --  20*  GLUCOSE 104* 148* 190* 191*  --  223*  BUN 13 14 24* 33*  --  48*  CALCIUM  8.3* 9.6 9.3 8.4*  --  8.1*  CREATININE 0.96 0.94 1.00 1.10*  --  1.41*  GFRNONAA >60  --  >60 58*  --  43*    LIVER FUNCTION TESTS: Recent Labs    08/28/24 1054 08/29/24 0553 09/03/24 1419 10/09/24 2348  BILITOT 1.4* 1.5* 1.3* 1.6*  AST 37 34 33 40  ALT 22 19 19 20   ALKPHOS 74 62 75 124  PROT 7.3 5.7* 7.0 6.5  ALBUMIN 3.1* 2.4* 3.4* 3.0*    Assessment and Plan:  59 y.o. female with PBC/AIH cirrhosis, UGIB secondary to gastric varices, TIPS, coil embolization of posterior and left gastric veins, and BRTO by  Dr. Jennefer on 10/10/24.   Patient was extubated this morning VSS, off pressors  MELD 17 today (10 yesterday)  CMP showed worsening of RF, PCCM notified  R internal jugular puncture site almost fully healed. Bloody BM on exam, ICU aware.  On lactulose  20 g TID and rifaximin   550 mg BID  ICU RN notified that patient may develop upper abdominal pain due to coli embo, the pain should get better with time.   Patient will follow with Dr. Jennefer in 1 month, CTA BRTO to be obtained on the same day/prior to the follow up visit - patient will receive a call from schedulers   Further treatment plan per PCCM/GI. Appreciate and agree with the plan.  IR will continue to follow.    Electronically Signed: Toya VEAR Cousin, PA-C 10/11/2024, 8:40 AM   I spent a total of 15 Minutes at the the patient's bedside AND on the patient's hospital floor or unit, greater than 50% of which was counseling/coordinating care for TIPS/BRTO/gastric varices embo follow up.   This chart was dictated using voice recognition software.  Despite best efforts to proofread,  errors can occur which can change the documentation meaning.   "

## 2024-10-11 NOTE — Progress Notes (Signed)
 eLink Physician-Brief Progress Note Patient Name: Susan [name] DOB: [birth year] MRN: [id number]   Date of Service  10/11/2024  HPI/Events of Note  Pt s/p EGD/TIPS w/ abd pain, no analgesics ordered.  eICU Interventions  Add acetaminophen  and Dilaudid  as needed     Intervention Category Minor Interventions: Routine modifications to care plan (e.g. PRN medications for pain, fever)  Sartaj Hoskin 10/11/2024, 8:49 PM

## 2024-10-11 NOTE — Procedures (Signed)
 Extubation Procedure Note  Patient Details:   Name: Susan [name] DOB: [birth year] MRN: [id number]   Airway Documentation:    Vent end date: 10/11/24 Vent end time: 0810   Evaluation  O2 sats: stable throughout Complications: No apparent complications Patient did tolerate procedure well. Bilateral Breath Sounds: Clear, Diminished   Yes. Cuff leak present, patient able to speak after, no stridor noted.  Cinzia Devos E Georgian Mcclory 10/11/2024, 9:05 AM

## 2024-10-11 NOTE — Progress Notes (Addendum)
 Patient ID: Susan [name], female   DOB: [birth year], 59 y.o.   MRN: [id number]    Progress Note   Subjective   Day # 2 CC; acute GI bleed with hematemesis in setting of PBC with cirrhosis  IV octreotide  IV PPI twice daily Ceftriaxone  IV  Extubated this a.m. Awake, following commands but sleepy, husband at bedside Has been stooling with lactulose   EGD yesterday-no esophageal varices, clotted blood in the cardia, gastric fundus and in the gastric body, large amount of type I isolated gastric varix in the cardia and fundus with no active bleeding but did have stigmata of recent bleeding and hematoma, medium in size  Patient is status post TIPS, coil embolization of posterior left gastric veins and coil assisted retrograde transvenous obliteration of gastrorenal shunt  Labs today WBC 21.3/hemoglobin 8.8/hemoglobin 25.8 Pro time 16.8/INR 1.3 Potassium 4.7/BUN 62/creatinine 2.3 trending up  T. bili 0.6/AST 64/ALT 31/alk phos 94 Ammonia pending     Objective   Vital signs in last 24 hours: Temp:  [98.8 F (37.1 C)-99.8 F (37.7 C)] 98.8 F (37.1 C) (01/18 0720) Pulse Rate:  [77-103] 91 (01/18 0831) Resp:  [18-28] 22 (01/18 0800) BP: (101-145)/(50-60) 110/58 (01/17 1400) SpO2:  [98 %-100 %] 100 % (01/18 0800) Arterial Line BP: (93-164)/(57-80) 126/64 (01/18 0800) FiO2 (%):  [40 %] 40 % (01/18 0400) Weight:  [85.5 kg] 85.5 kg (01/18 0706) Last BM Date : 10/11/24 General:    Older African-American female female in NAD, somnolent but arousable, Heart:  Regular rate and rhythm; no murmurs Lungs: Respirations even and unlabored, lungs CTA bilaterally Abdomen:  Soft, nontender and nondistended. Normal bowel sounds. Extremities:  Without edema. Neurologic: Somnolent but arousable, has been following commands this morning postextubation, propofol  off   Intake/Output from previous day: 01/17 0701 - 01/18 0700 In: 2153.3 [I.V.:1953.3; IV Piggyback:200] Out: 425 [Emesis/NG  output:400; Blood:25] Intake/Output this shift: Total I/O In: 368.7 [I.V.:268.7; IV Piggyback:100] Out: 200 [Urine:200]  Lab Results: Recent Labs    10/10/24 0754 10/10/24 1228 10/10/24 1949 10/11/24 0905  WBC 10.0  --  22.9* 21.3*  HGB 10.6* 9.9* 10.0* 8.8*  HCT 31.1* 29.0* 28.9* 25.8*  PLT 205  --  224 139*   BMET Recent Labs    10/10/24 0754 10/10/24 1228 10/10/24 1949 10/11/24 0905  NA 137 138 136 137  K 4.9 5.5* 4.9 4.7  CL 106  --  106 106  CO2 22  --  20* 19*  GLUCOSE 191*  --  223* 230*  BUN 33*  --  48* 62*  CREATININE 1.10*  --  1.41* 2.30*  CALCIUM  8.4*  --  8.1* 8.0*   LFT Recent Labs    10/11/24 0905  PROT 5.6*  ALBUMIN 2.7*  AST 64*  ALT 31  ALKPHOS 94  BILITOT 0.6   PT/INR Recent Labs    10/10/24 0040 10/11/24 0905  LABPROT 15.1 16.8*  INR 1.1 1.3*    Studies/Results: DG CHEST PORT 1 VIEW Result Date: 10/11/2024 EXAM: 1 VIEW(S) XRAY OF THE CHEST 10/11/2024 07:46:00 AM COMPARISON: 08/28/2024 CLINICAL HISTORY: Hypoxia. FINDINGS: LINES, TUBES AND DEVICES: Endotracheal tube in place with tip 3.1 cm above the carina. LUNGS AND PLEURA: Mildly decreased lung volumes. Mild left basilar linear subsegmental atelectasis. No pleural effusion. No pneumothorax. HEART AND MEDIASTINUM: No acute abnormality of the cardiac and mediastinal silhouettes. BONES AND SOFT TISSUES: No acute osseous abnormality. Left upper quadrant embolization coils noted. IMPRESSION: 1. Endotracheal tube tip 3.1 cm above the  carina. 2. Mild left basilar linear subsegmental atelectasis. 3. Left upper quadrant embolization coils. Electronically signed by: Evalene Coho MD 10/11/2024 07:50 AM EST RP Workstation: [id number]   CT ABDOMEN PELVIS W CONTRAST Result Date: 10/10/2024 EXAM: CT ABDOMEN AND PELVIS WITH CONTRAST 10/10/2024 01:43:16 AM TECHNIQUE: CT of the abdomen and pelvis was performed with the administration of 75 mL of iohexol  (OMNIPAQUE ) 350 MG/ML injection. Multiplanar  reformatted images are provided for review. Automated exposure control, iterative reconstruction, and/or weight-based adjustment of the mA/kV was utilized to reduce the radiation dose to as low as reasonably achievable. COMPARISON: None available. CLINICAL HISTORY: History of cirrhosis. Abdominal pain. Epigastric. Vomiting blood. Elevated lipase. Likely related to esophageal varices. FINDINGS: LOWER CHEST: No acute abnormality. LIVER: Nodular liver compatible with cirrhosis. GALLBLADDER AND BILE DUCTS: Small gallstone noted within the gallbladder. No biliary ductal dilatation. SPLEEN: Normal size. No focal abnormality. PANCREAS: No acute abnormality. ADRENAL GLANDS: No acute abnormality. KIDNEYS, URETERS AND BLADDER: No stones in the kidneys or ureters. No hydronephrosis. No perinephric or periureteral stranding. Urinary bladder is unremarkable. GI AND BOWEL: The stomach is moderately distended with fluid and debris. Normal appendix. Mild wall thickening in the right colon could reflect portal colopathy or colitis. There is no bowel obstruction. PERITONEUM AND RETROPERITONEUM: No ascites. No free air. VASCULATURE: Associated upper abdominal varices. Aorta is normal in caliber. Spontaneous left splenorenal shunt. LYMPH NODES: No lymphadenopathy. REPRODUCTIVE ORGANS: No acute abnormality. BONES AND SOFT TISSUES: No acute osseous abnormality. No focal soft tissue abnormality. IMPRESSION: 1. Nodular liver compatible with cirrhosis, with associated upper abdominal varices and spontaneous left splenorenal shunt. 2. Moderately distended stomach with fluid and debris. 3. Mild wall thickening in the right colon, possibly reflecting portal colopathy or colitis. Electronically signed by: Franky Crease MD 10/10/2024 01:47 AM EST RP Workstation: [id number]   CT Head Wo Contrast Result Date: 10/10/2024 EXAM: CT HEAD WITHOUT CONTRAST 10/10/2024 01:43:16 AM TECHNIQUE: CT of the head was performed without the administration of  intravenous contrast. Automated exposure control, iterative reconstruction, and/or weight based adjustment of the mA/kV was utilized to reduce the radiation dose to as low as reasonably achievable. COMPARISON: 08/28/2024 CLINICAL HISTORY: Altered mental status FINDINGS: BRAIN AND VENTRICLES: No acute hemorrhage. No evidence of acute infarct. No hydrocephalus. No extra-axial collection. No mass effect or midline shift. ORBITS: No acute abnormality. SINUSES: No acute abnormality. SOFT TISSUES AND SKULL: No acute soft tissue abnormality. No skull fracture. IMPRESSION: 1. No acute intracranial abnormality. Electronically signed by: Franky Stanford MD 10/10/2024 01:45 AM EST RP Workstation: [id number]       Assessment / Plan:    #87  59 year old female with a.m. 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  No prior history of GI bleeding Prior EGD 2019 no esophageal varices but there was possibility of an isolated type I gastric varix. Patient has been on carvedilol   MELD-Na= 12 on admission  EGD yesterday no esophageal varices, extensive clotted blood in the stomach, type I isolated gastric varix found in the cardia with no active bleeding however there was stigmata of recent bleeding with fibrin plug, medium size varices.  IR consulted for urgent TIPS versus BRTO.  Had also been noted to have new finding of a spontaneous splenorenal shunt on CT this admission.  She is now status post TIPS, and coil embolization of posterior and left gastric veins and coil assisted retrograde  transvenous obliteration of gastrorenal shunt  Hemodynamically stable, no further evidence of active bleeding, extubated this morning  Hemoglobin 8.8  #2 leukocytosis likely reactive  #3 hepatic encephalopathy-worsened since admission in setting of  active hemorrhage, sedation, and TIPS  #4 acute kidney injury-post TIPS and hemorrhagic shock  Plan; lactulose  30 cc 3 times daily Start Xifaxan  550 twice daily Start zinc  twice daily Long discussion with patient's husband at bedside regarding importance of compliance with medications for HE on discharge.  He related that patient had been reluctant to continue taking lactulose  after her last admission, but was taking 30 cc at bedtime  Weaning off Levophed  today Stop octreotide  tomorrow Decrease IV PPI to once daily Continue ceftriaxone  x 5 days total Repeat labs in a.m. Start clear liquids  GI will continue to follow with you  Principal Problem:   GI bleeding Active Problems:   Hematemesis with nausea   Portal hypertension (HCC)     LOS: 1 day   Amy Esterwood PA-C 10/11/2024, 11:29 AM    Attending physician's note   I have taken history, reviewed the chart and examined the patient. I performed a substantive portion of this encounter, including complete performance of at least one of the key components, in conjunction with the APP. I agree with the Advanced Practitioner's note, impression and recommendations.   Gastric variceal bleed (IGV1) s/p TIPS w/t coil embolization of posterior/left gastric veins and coil assisted retrograde transvenous obliteration of gastrorenal shunt (BRTO with coils). No further bleeding.  Extubated. HD stable.  Weaning off Levophed . Hepatic encephalopathy (exacerbated post TIPS) AKI -likely prerenal d/t shock. 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 MELD 3.0: 19 No ascites.  Plan: - Can start clear liquids.  Advance diet as tolerated. - Lactulose  30 TID-QID depending upon the response + rifaximin  550BID + Zinc  - Trend CBC, CMP. Keep Hb > 7. - If any further worsening of Cr, recommend nephrology consultation. - Stopped octreotide  in AM - Can continue ceftriaxone  x 5 days - TIPS care per IR - FU with [name] as outpt for  consideration of liver transplant.  Dr. [name] taking over the service tomorrow.   Anselm Bring, MD [facility] [phone]   MELD 3.0: 19 at 10/11/2024  9:05 AM MELD-Na: 17 at 10/11/2024  9:05 AM Calculated from: Serum Creatinine: 2.3 mg/dL at 8/81/7973  0:94 AM Serum Sodium: 137 mmol/L at 10/11/2024  9:05 AM Total Bilirubin: 0.6 mg/dL (Using min of 1 mg/dL) at 8/81/7973  0:94 AM Serum Albumin: 2.7 g/dL at 8/81/7973  0:94 AM INR(ratio): 1.3 at 10/11/2024  9:05 AM Age at listing (hypothetical): 59 years Sex: Female at 10/11/2024  9:05 AM

## 2024-10-11 NOTE — Progress Notes (Signed)
 "  NAME:  COLLYN SELK, MRN:  [id number], DOB:  10-Dec-1965, LOS: 1 ADMISSION DATE:  10/09/2024, CONSULTATION DATE:  10/11/24 REFERRING MD:  MELODIE, CHIEF COMPLAINT:  UGIB   History of Present Illness:  59 year old female with a past medical history of autoimmune liver cirrhosis, hepatic encephalopathy, esophageal varices, primary biliary cholangitis, hypertension, hyperlipidemia, obesity who presented to the hospital with multiple episodes of bloody emesis.   Of note, patient was recently admitted to the hospital and December 2025 with hepatic encephalopathy and is known to have esophageal varices.  In the ER, she was noted to have 1 more episode of bloody emesis of dark blood.  Her vitals otherwise have been stable and she is hemodynamically stable.  Laboratory workup significant for anemia with a hemoglobin of 10.7 on presentation with a baseline of 13.  Repeat hemoglobin check with drop to 9.2.  She is currently receiving on unit of blood.  CT head without any acute abnormality.  CT abdomen and pelvis showing nodular liver compatible with cirrhosis and moderately distended stomach with fluid and debris as well as mild wall thickening in the right colon.  No ascites noted.  Pertinent  Medical History  As above  Significant Hospital Events: Including procedures, antibiotic start and stop dates in addition to other pertinent events   10/10/2024 admitted to the ICU for UGIB. Underwent EGD and then IR TIPS and coil embolization of posterior and left gastric veins with coil assisted retrograde transvenous obliteration of gastrorenal shunt. 10/11/2024: passed SBT this AM and extubated  Interim History / Subjective:  Afebrile, HR 80 - 100s, hemodynamics stable off pressors, SPO2 > 90% PRVC 400/+5/40% I/O + 1.7 L Drips: all off at this time.  Objective    Blood pressure (!) 110/58, pulse 91, temperature 98.8 F (37.1 C), temperature source Axillary, resp. rate (!) 22, height 5' (1.524 m), weight 85.5  kg, SpO2 100%.    Vent Mode: PRVC FiO2 (%):  [40 %] 40 % Set Rate:  [18 bmp] 18 bmp Vt Set:  [400 mL] 400 mL PEEP:  [5 cmH20] 5 cmH20 Plateau Pressure:  [18 cmH20] 18 cmH20   Intake/Output Summary (Last 24 hours) at 10/11/2024 0851 Last data filed at 10/11/2024 0400 Gross per 24 hour  Intake 2128.69 ml  Output 25 ml  Net 2103.69 ml   Filed Weights   10/09/24 2347 10/10/24 0400 10/11/24 0706  Weight: 83 kg 84.2 kg 85.5 kg    Examination: General: awake, follows commands by hand grip and tongue protrusion Eyes: PERRL, no scleral icterus Skin: warm, intact, no rashes Neck: JVD flat, ROM and lymph node assessment normal CV: RRR, no MRG, nl S1 and S2, no peripheral edema Resp: clear to auscultation bilaterally Abdom: Normoactive bowel sounds, soft, nontender, nondistended, no hepatosplenomegaly Neuro: Awake alert oriented to person place time and situation  Resolved problem list   Assessment and Plan  #Hemorrhagic Shock: 2/2 variceal bleeding now s/p IR embolization and TIPs procedure - 2 large bore IV - Octreotide  drip - PPI BID - Wean levophed  to off. - CBC Q 8 H and transfuse to keep Hgb > 7 mg/dL - Hold home carvedilol  - Target MAP > 65 mmHg  #Acute Hypoxic Respiratory Failure: intubated for airway protection and to facilitate procedures with GI and IR. - Passed SBT this AM, extubated to [place]. Doing amazingly.  #SBP prophylaxis: - Ceftriaxone  1 g IV daily (end date 1/21)  #Stress Ulcer PPx: already on PPI for UGIB  #Nutrition: bedside swallow  today, if passes, may do clears  #Hyperammonemia: Patient not confused and without asterixes. She is at risk for hepatic encephalopathy due to liver disease and TIPs procedure - Lactulose  20 mg TID - Titrate to 3 BMs daily - Ammonia level tom AM  #Autoimmune Liver Cirrhosis: MELD Score 10, 6% estimated 3 month mortality - No evidence of hepatic encephalopathy - Follow up with hepatology as an OP.  #VTE ppx: holding any  chemical ppx due to GIB.  #DM: - SSI  Disposition: ICU appropriate for hemorrhagic shock.  Patient Lines/Drains/Airways Status     Active Line/Drains/Airways     Name Placement date Placement time Site Days   Arterial Line 10/10/24 Left Radial 10/10/24  1250  Radial  1   Peripheral IV 10/10/24 20 G Right Antecubital 10/10/24  0047  Antecubital  1   Peripheral IV 10/10/24 20 G Anterior;Distal;Left Forearm 10/10/24  0205  Forearm  1   Urethral Catheter [place]  Bailey, NT Latex 14 Fr. 10/10/24  1800  Latex  1            Labs   CBC: Recent Labs  Lab 10/09/24 2348 10/10/24 0157 10/10/24 0754 10/10/24 1228 10/10/24 1949  WBC 12.1* 10.2 10.0  --  22.9*  NEUTROABS  --   --  7.8*  --  16.8*  HGB 10.7* 9.2* 10.6* 9.9* 10.0*  HCT 32.3* 27.7* 31.1* 29.0* 28.9*  MCV 93.4 94.2 92.6  --  92.0  PLT 240 207 205  --  224    Basic Metabolic Panel: Recent Labs  Lab 10/09/24 2348 10/10/24 0754 10/10/24 1228 10/10/24 1949  NA 137 137 138 136  K 4.7 4.9 5.5* 4.9  CL 102 106  --  106  CO2 25 22  --  20*  GLUCOSE 190* 191*  --  223*  BUN 24* 33*  --  48*  CREATININE 1.00 1.10*  --  1.41*  CALCIUM  9.3 8.4*  --  8.1*   GFR: Estimated Creatinine Clearance: 42.2 mL/min (A) (by C-G formula based on SCr of 1.41 mg/dL (H)). Recent Labs  Lab 10/09/24 2348 10/10/24 0157 10/10/24 0754 10/10/24 1949  WBC 12.1* 10.2 10.0 22.9*    Liver Function Tests: Recent Labs  Lab 10/09/24 2348  AST 40  ALT 20  ALKPHOS 124  BILITOT 1.6*  PROT 6.5  ALBUMIN 3.0*   Recent Labs  Lab 10/09/24 2348  LIPASE 84*   Recent Labs  Lab 10/10/24 0754  AMMONIA 113*    ABG    Component Value Date/Time   PHART 7.324 (L) 10/10/2024 1228   PCO2ART 43.7 10/10/2024 1228   PO2ART 143 (H) 10/10/2024 1228   HCO3 22.7 10/10/2024 1228   TCO2 24 10/10/2024 1228   ACIDBASEDEF 3.0 (H) 10/10/2024 1228   O2SAT 99 10/10/2024 1228     Coagulation Profile: Recent Labs  Lab 10/10/24 0040  INR 1.1     Cardiac Enzymes: No results for input(s): CKTOTAL, CKMB, CKMBINDEX, TROPONINI in the last 168 hours.  HbA1C: Hemoglobin A1C  Date/Time Value Ref Range Status  11/06/2016 12:00 AM 9.8  Final   Hgb A1c MFr Bld  Date/Time Value Ref Range Status  06/10/2024 09:20 AM 5.4 4.6 - 6.5 % Final    Comment:    Glycemic Control Guidelines for People with Diabetes:Non Diabetic:  <6%Goal of Therapy: <7%Additional Action Suggested:  >8%   05/07/2023 10:23 AM 11.0 (H) 4.6 - 6.5 % Final    Comment:    Glycemic Control Guidelines  for People with Diabetes:Non Diabetic:  <6%Goal of Therapy: <7%Additional Action Suggested:  >8%     CBG: Recent Labs  Lab 10/10/24 1147 10/10/24 1921 10/10/24 2323 10/11/24 0358 10/11/24 0716  GLUCAP 169* 207* 226* 234* 229*    Review of Systems:   Not obtained  Past Medical History:  She,  has a past medical history of Allergy, Anemia, Cirrhosis (HCC), Constipation, Contact lens/glasses fitting, Diabetes mellitus without complication (HCC), Edema of both lower extremities, Food allergy, Gastric varices, GERD (gastroesophageal reflux disease), Heart murmur, Hyperlipidemia, Hypertension, Joint pain, Periumbilical hernia, Primary biliary cholangitis (HCC), Snores, and Vitamin D  deficiency.   Surgical History:   Past Surgical History:  Procedure Laterality Date   BREAST BIOPSY Left 10/02/2023   MM LT BREAST BX W LOC DEV 1ST LESION IMAGE BX SPEC STEREO GUIDE 10/02/2023 [facility]   BREAST BIOPSY Left 10/02/2023   MM LT BREAST BX W LOC DEV EA AD LESION IMG BX SPEC STEREO GUIDE 10/02/2023 [facility]   DILATION AND CURETTAGE OF UTERUS  11/1995, 10/1997   LEEP     LIVER BIOPSY     TUBAL LIGATION  10/1997   UMBILICAL HERNIA REPAIR N/A 08/11/2013   Procedure: HERNIA REPAIR UMBILICAL ADULT;  Surgeon: Lynwood MALVA Pina, MD;  Location: [facility];  Service: General;  Laterality: N/A;     Social History:   reports that she has never smoked.  She has never used smokeless tobacco. She reports current alcohol use. She reports that she does not use drugs.   Family History:  Her family history includes Breast cancer in her mother; Depression in her mother; Diabetes in her father and another family member; Heart disease in her maternal aunt and maternal uncle; Hypertension in her mother; Lung cancer in her maternal grandfather and maternal grandmother; Obesity in her mother; Other in her father; Renal Disease in her father; Schizophrenia in her mother; Uterine cancer in her mother. There is no history of Colon cancer, Esophageal cancer, Stomach cancer, or Rectal cancer.   Allergies Allergies[1]   Home Medications  Prior to Admission medications  Medication Sig Start Date End Date Taking? Authorizing Provider  carvedilol  (COREG ) 3.125 MG tablet Take 3.125 mg by mouth 2 (two) times daily.   Yes [provider]  hydrochlorothiazide  (HYDRODIURIL ) 25 MG tablet TAKE 1 TABLET BY MOUTH DAILY 01/07/24  Yes Dugal, Tabitha, FNP  lactulose  (CHRONULAC ) 10 GM/15ML solution Take 45 mLs (30 g total) by mouth 2 (two) times daily. 08/29/24  Yes Mdala-Gausi, Masiku Agatha, MD  spironolactone  (ALDACTONE ) 50 MG tablet Take 1 tablet (50 mg total) by mouth daily. 09/07/24  Yes Dugal, Ginger, FNP  ursodiol  (ACTIGALL ) 300 MG capsule TAKE 2 CAPSULES BY MOUTH TWICE  DAILY 10/22/23  Yes Armbruster, Elspeth SQUIBB, MD     Due to a high probability of clinically significant, life threatening deterioration, the patient required my highest level of preparedness to intervene emergently and I personally spent this critical care time directly and personally managing the patient. This critical care time included obtaining a history; examining the patient; pulse oximetry; ordering and review of studies; arranging urgent treatment with development of a management plan; evaluation of patient's response to treatment; frequent reassessment; and, discussions with other  providers.  This critical care time was performed to assess and manage the high probability of imminent, life-threatening deterioration that could result in multi-organ failure. It was exclusive of separately billable procedures and treating other patients and teaching time. Critical Care Time: 35 minutes.  Paula Southerly, MD [facility]             [1]  Allergies Allergen Reactions   Mushroom Extract Complex (Obsolete) Anaphylaxis    Throat swells   Grapefruit Concentrate Other (See Comments)    blisters   Mounjaro  [Tirzepatide ] Other (See Comments)    Hepatic encephalopathy   Sulfa Antibiotics Rash    Severe dehydration   Amlodipine Other (See Comments)    Heart rate fast   Crestor  [Rosuvastatin ]     Brain fog, fatigue, felt 'terrible'.    Shellfish Allergy Hives        Simvastatin      Brain fog, fatigue, felt 'terrible'.    Strawberry Extract     hives   Metformin  And Related Diarrhea    Weakness    "

## 2024-10-12 ENCOUNTER — Encounter (HOSPITAL_COMMUNITY): Payer: Self-pay | Admitting: Radiology

## 2024-10-12 DIAGNOSIS — K7682 Hepatic encephalopathy: Secondary | ICD-10-CM | POA: Diagnosis not present

## 2024-10-12 DIAGNOSIS — E7220 Disorder of urea cycle metabolism, unspecified: Secondary | ICD-10-CM | POA: Diagnosis not present

## 2024-10-12 DIAGNOSIS — Z95828 Presence of other vascular implants and grafts: Secondary | ICD-10-CM | POA: Diagnosis not present

## 2024-10-12 DIAGNOSIS — K743 Primary biliary cirrhosis: Secondary | ICD-10-CM | POA: Diagnosis not present

## 2024-10-12 DIAGNOSIS — K922 Gastrointestinal hemorrhage, unspecified: Secondary | ICD-10-CM | POA: Diagnosis not present

## 2024-10-12 DIAGNOSIS — K7469 Other cirrhosis of liver: Secondary | ICD-10-CM | POA: Diagnosis not present

## 2024-10-12 DIAGNOSIS — K279 Peptic ulcer, site unspecified, unspecified as acute or chronic, without hemorrhage or perforation: Secondary | ICD-10-CM

## 2024-10-12 DIAGNOSIS — D72829 Elevated white blood cell count, unspecified: Secondary | ICD-10-CM | POA: Diagnosis not present

## 2024-10-12 DIAGNOSIS — E119 Type 2 diabetes mellitus without complications: Secondary | ICD-10-CM

## 2024-10-12 DIAGNOSIS — I864 Gastric varices: Secondary | ICD-10-CM | POA: Diagnosis not present

## 2024-10-12 DIAGNOSIS — N179 Acute kidney failure, unspecified: Secondary | ICD-10-CM | POA: Diagnosis not present

## 2024-10-12 DIAGNOSIS — I8290 Acute embolism and thrombosis of unspecified vein: Secondary | ICD-10-CM

## 2024-10-12 LAB — CBC WITH DIFFERENTIAL/PLATELET
Abs Immature Granulocytes: 0.79 K/uL — ABNORMAL HIGH (ref 0.00–0.07)
Abs Immature Granulocytes: 0.86 K/uL — ABNORMAL HIGH (ref 0.00–0.07)
Basophils Absolute: 0.1 K/uL (ref 0.0–0.1)
Basophils Absolute: 0.1 K/uL (ref 0.0–0.1)
Basophils Relative: 0 %
Basophils Relative: 0 %
Eosinophils Absolute: 0 K/uL (ref 0.0–0.5)
Eosinophils Absolute: 0.2 K/uL (ref 0.0–0.5)
Eosinophils Relative: 0 %
Eosinophils Relative: 1 %
HCT: 23.9 % — ABNORMAL LOW (ref 36.0–46.0)
HCT: 23 % — ABNORMAL LOW (ref 36.0–46.0)
Hemoglobin: 8.2 g/dL — ABNORMAL LOW (ref 12.0–15.0)
Hemoglobin: 7.9 g/dL — ABNORMAL LOW (ref 12.0–15.0)
Immature Granulocytes: 4 %
Immature Granulocytes: 5 %
Lymphocytes Relative: 14 %
Lymphocytes Relative: 16 %
Lymphs Abs: 2.6 K/uL (ref 0.7–4.0)
Lymphs Abs: 2.9 K/uL (ref 0.7–4.0)
MCH: 31.4 pg (ref 26.0–34.0)
MCH: 32.1 pg (ref 26.0–34.0)
MCHC: 34.3 g/dL (ref 30.0–36.0)
MCHC: 34.3 g/dL (ref 30.0–36.0)
MCV: 91.6 fL (ref 80.0–100.0)
MCV: 93.5 fL (ref 80.0–100.0)
Monocytes Absolute: 2.6 K/uL — ABNORMAL HIGH (ref 0.1–1.0)
Monocytes Absolute: 2.5 K/uL — ABNORMAL HIGH (ref 0.1–1.0)
Monocytes Relative: 13 %
Monocytes Relative: 13 %
Neutro Abs: 13.3 K/uL — ABNORMAL HIGH (ref 1.7–7.7)
Neutro Abs: 12.2 K/uL — ABNORMAL HIGH (ref 1.7–7.7)
Neutrophils Relative %: 69 %
Neutrophils Relative %: 65 %
Platelets: 114 K/uL — ABNORMAL LOW (ref 150–400)
Platelets: 114 K/uL — ABNORMAL LOW (ref 150–400)
RBC: 2.61 MIL/uL — ABNORMAL LOW (ref 3.87–5.11)
RBC: 2.46 MIL/uL — ABNORMAL LOW (ref 3.87–5.11)
RDW: 14.1 % (ref 11.5–15.5)
RDW: 14 % (ref 11.5–15.5)
WBC: 19.4 K/uL — ABNORMAL HIGH (ref 4.0–10.5)
WBC: 18.7 K/uL — ABNORMAL HIGH (ref 4.0–10.5)
nRBC: 1.4 % — ABNORMAL HIGH (ref 0.0–0.2)
nRBC: 2.4 % — ABNORMAL HIGH (ref 0.0–0.2)

## 2024-10-12 LAB — GLUCOSE, CAPILLARY
Glucose-Capillary: 167 mg/dL — ABNORMAL HIGH (ref 70–99)
Glucose-Capillary: 181 mg/dL — ABNORMAL HIGH (ref 70–99)
Glucose-Capillary: 179 mg/dL — ABNORMAL HIGH (ref 70–99)
Glucose-Capillary: 174 mg/dL — ABNORMAL HIGH (ref 70–99)
Glucose-Capillary: 187 mg/dL — ABNORMAL HIGH (ref 70–99)
Glucose-Capillary: 211 mg/dL — ABNORMAL HIGH (ref 70–99)
Glucose-Capillary: 142 mg/dL — ABNORMAL HIGH (ref 70–99)

## 2024-10-12 LAB — BASIC METABOLIC PANEL WITH GFR
Anion gap: 11 (ref 5–15)
BUN: 79 mg/dL — ABNORMAL HIGH (ref 6–20)
CO2: 20 mmol/L — ABNORMAL LOW (ref 22–32)
Calcium: 7.8 mg/dL — ABNORMAL LOW (ref 8.9–10.3)
Chloride: 106 mmol/L (ref 98–111)
Creatinine, Ser: 2.65 mg/dL — ABNORMAL HIGH (ref 0.44–1.00)
GFR, Estimated: 20 mL/min — ABNORMAL LOW
Glucose, Bld: 172 mg/dL — ABNORMAL HIGH (ref 70–99)
Potassium: 4 mmol/L (ref 3.5–5.1)
Sodium: 136 mmol/L (ref 135–145)

## 2024-10-12 LAB — MAGNESIUM: Magnesium: 1.7 mg/dL (ref 1.7–2.4)

## 2024-10-12 LAB — PHOSPHORUS: Phosphorus: 4.2 mg/dL (ref 2.5–4.6)

## 2024-10-12 LAB — AMMONIA: Ammonia: 42 umol/L — ABNORMAL HIGH (ref 9–35)

## 2024-10-12 MED ORDER — LACTULOSE 10 GM/15ML PO SOLN
20.0000 g | Freq: Two times a day (BID) | ORAL | Status: DC
  Administered 2024-10-12 (×2): 20 g via ORAL
  Filled 2024-10-12 (×2): qty 30

## 2024-10-12 MED ORDER — HEPARIN SODIUM (PORCINE) 5000 UNIT/ML IJ SOLN
5000.0000 [IU] | Freq: Three times a day (TID) | INTRAMUSCULAR | Status: DC
  Administered 2024-10-12 – 2024-10-14 (×6): 5000 [IU] via SUBCUTANEOUS
  Filled 2024-10-12 (×6): qty 1

## 2024-10-12 MED ORDER — INSULIN ASPART 100 UNIT/ML IJ SOLN
0.0000 [IU] | Freq: Three times a day (TID) | INTRAMUSCULAR | Status: DC
  Administered 2024-10-12: 3 [IU] via SUBCUTANEOUS
  Administered 2024-10-12: 5 [IU] via SUBCUTANEOUS
  Administered 2024-10-13: 3 [IU] via SUBCUTANEOUS
  Filled 2024-10-12: qty 5
  Filled 2024-10-12 (×2): qty 3

## 2024-10-12 MED ORDER — MAGNESIUM SULFATE 2 GM/50ML IV SOLN
2.0000 g | Freq: Once | INTRAVENOUS | Status: AC
  Administered 2024-10-12: 2 g via INTRAVENOUS
  Filled 2024-10-12: qty 50

## 2024-10-12 MED ORDER — CARVEDILOL 3.125 MG PO TABS
3.1250 mg | ORAL_TABLET | Freq: Two times a day (BID) | ORAL | Status: DC
  Administered 2024-10-12 – 2024-10-18 (×9): 3.125 mg via ORAL
  Filled 2024-10-12 (×11): qty 1

## 2024-10-12 NOTE — Plan of Care (Signed)

## 2024-10-12 NOTE — Progress Notes (Signed)
 Pt transfer from 45M with nurse and SWOT.  PT placed on tele box 1 and Tammy P, RN second verified. Pt in bed drowsy but arousable. Husband at bedside. Place call button and room phone in reach. Oriented the husband to the room and explain how he can reach me.

## 2024-10-12 NOTE — Progress Notes (Addendum)
 "   Inpatient Progress Note     Patient Profile/Chief Complaint  59 year old female with a past medical history of autoimmune liver cirrhosis, hepatic encephalopathy, gastric varices, primary biliary cholangitis, hypertension, hyperlipidemia, obesity who presented to the hospital with multiple episodes of bloody emesis secondary to bleeding gastric varix.  EGD 10/10/2024 - no esophageal varices, clotted blood in the cardia, gastric fundus and in the gastric body, large amount of type I isolated gastric varix in the cardia and fundus with no active bleeding but did have stigmata of recent bleeding and hematoma, medium in size  CTAP with new finding of spontaneous splenorenal shunt   10/10/2024  status post TIPS, coil embolization of posterior left gastric veins and coil assisted retrograde transvenous obliteration of gastrorenal shunt   Interval History   -- Opens eyes, but sleepy  -- Reports abdominal pain status post TIPS controlled with pain medication -- Ate a small amount of Jell-O without worsening pain, nausea or vomiting -- 5 bowel movements recorded yesterday; ammonia 42 -- Labs today: WBC 19.4, Hgb 8.2, HCT 23.9, platelets 114; BUN 79/creatinine 2.65  MELD 3.0: 21 at 10/12/2024  5:57 AM MELD-Na: 20 at 10/12/2024  5:57 AM Calculated from: Serum Creatinine: 2.65 mg/dL at 8/80/7973  4:42 AM Serum Sodium: 136 mmol/L at 10/12/2024  5:57 AM Total Bilirubin: 0.6 mg/dL (Using min of 1 mg/dL) at 8/81/7973  0:94 AM Serum Albumin: 2.7 g/dL at 8/81/7973  0:94 AM INR(ratio): 1.3 at 10/11/2024  9:05 AM Age at listing (hypothetical): 59 years Sex: Female at 10/12/2024  5:57 AM    Objective   Vital signs in last 24 hours: Temp:  [98.2 F (36.8 C)-99.5 F (37.5 C)] 98.3 F (36.8 C) (01/19 0753) Pulse Rate:  [88-117] 91 (01/19 1003) Resp:  [0-34] 22 (01/19 1003) BP: (132)/(66) 132/66 (01/19 1003) SpO2:  [92 %-100 %] 99 % (01/19 1003) Arterial Line BP: (101-171)/(45-80) 161/61 (01/19  1003) Weight:  [86.4 kg] 86.4 kg (01/19 0500) Last BM Date : 10/12/24 General:    Somnolent but opens eyes to stimulation, resting in bed in no distress Heart:  Regular rate and rhythm; no murmurs Lungs: Respirations even and unlabored, lungs CTA bilaterally Abdomen:  Soft, nontender and nondistended. Normal bowel sounds. Extremities:  Without edema. Neurologic: Somnolent, but opens eyes to stimulation   Intake/Output from previous day: 01/18 0701 - 01/19 0700 In: 1267.2 [P.O.:350; I.V.:817.2; IV Piggyback:100] Out: 1060 [Urine:1060] Intake/Output this shift: Total I/O In: 294.5 [P.O.:120; I.V.:72.3; IV Piggyback:102.2] Out: 115 [Urine:115]  Lab Results: Recent Labs    10/11/24 1505 10/11/24 2209 10/12/24 0557  WBC 16.5* 16.9* 19.4*  HGB 8.4* 8.0* 8.2*  HCT 24.3* 22.9* 23.9*  PLT 103* 97* 114*   BMET Recent Labs    10/10/24 1949 10/11/24 0905 10/12/24 0557  NA 136 137 136  K 4.9 4.7 4.0  CL 106 106 106  CO2 20* 19* 20*  GLUCOSE 223* 230* 172*  BUN 48* 62* 79*  CREATININE 1.41* 2.30* 2.65*  CALCIUM  8.1* 8.0* 7.8*   LFT Recent Labs    10/11/24 0905  PROT 5.6*  ALBUMIN 2.7*  AST 64*  ALT 31  ALKPHOS 94  BILITOT 0.6   PT/INR Recent Labs    10/10/24 0040 10/11/24 0905  LABPROT 15.1 16.8*  INR 1.1 1.3*   Ammonia 42  Studies/Results: IR Tips Result Date: 10/11/2024 CLINICAL DATA:  59 year old female with history of primary biliary cirrhosis complicated by gastric varices and gastro renal shunt presenting with gastric variceal hemorrhage.  EXAM: 1. Ultrasound-guided access of the right internal jugular vein 2. Ultrasound-guided access of the right common femoral vein 3. Intravascular ultrasound 4. Catheterization of the portal vein 5. Portal venous and central manometry 6. Portal venogram 7. Creation of a transhepatic portal vein to hepatic vein shunt 8. Coil embolization of the left gastric and 2 posterior gastric veins 9. Coil assisted retrograde  transvenous obliteration of gastro renal shunt MEDICATIONS: As antibiotic prophylaxis, Rocephin  1 gm IV was ordered pre-procedure and administered intravenously within one hour of incision. ANESTHESIA/SEDATION: General - as administered by the Anesthesia department CONTRAST:  Fifty ML Omnipaque  300, intravenous FLUOROSCOPY TIME:  Four hundred eighty mGy reference air kerma COMPLICATIONS: None immediate. PROCEDURE: Informed written consent was obtained from the patient after a thorough discussion of the procedural risks, benefits and alternatives. All questions were addressed. Maximal Sterile Barrier Technique was utilized including caps, mask, sterile gowns, sterile gloves, sterile drape, hand hygiene and skin antiseptic. A timeout was performed prior to the initiation of the procedure. A preliminary ultrasound of the right groin was performed and demonstrates a patent right common femoral vein. A permanent ultrasound image was recorded. Using a combination of fluoroscopy and ultrasound, an access site was determined. A small dermatotomy was made at the planned puncture site. Using ultrasound guidance, access into the right common femoral vein was obtained with visualization of needle entry into the vessel using a standard micropuncture technique. A wire was advanced into the IVC insert all fascial dilation performed. An 8 French, 11 cm vascular sheath was placed into the external iliac vein. Through this access site, an 86 French Accunav ICE catheter was advanced with ease under fluoroscopic guidance to the level of the intrahepatic inferior vena cava. A preliminary ultrasound of the right neck was performed and demonstrates a patent internal jugular vein. A permanent ultrasound image was recorded. Using a combination of fluoroscopy and ultrasound, an access site was determined. A small dermatotomy was made at the planned puncture site. Using ultrasound guidance, access into the right internal jugular vein was  obtained with visualization of needle entry into the vessel using a standard micropuncture technique. A wire was advanced into the IVC and serial fascial dilation performed. A 10 French tips sheath was placed into the internal jugular vein and advanced to the IVC. The jugular sheath was retracted into the right atrium and manometry was performed. A 5 French angled tip catheter was then directed into the middle hepatic vein. The catheter was advanced to a wedge portion of the a patent vein over which the 10 French sheath was advanced into the middle hepatic vein. Using ICE ultrasound visualization the catheter within the middle hepatic vein as well as the portal anatomy was defined. A planned exit site from the hepatic vein and puncture site from the portal vein was placed into a single sonographic plane. Under direct ultrasound visualization, the ScorpionX needle was advanced into the central right portal vein. Hand injection of contrast confirmed position within the portal system. A Glidewire Advantage was then advanced into the splenic vein. A 5 French marking pigtail catheter was then advanced over the wire into the main portal vein and wire removed. Portal venogram was performed which demonstrated a patent portal vein with hepatofugal flow. Multiple large varices were seen arising from the main portal vein. Portal manometry was then performed. The tract was then dilated to 6 mm with an 6 mm x 8 cm Athletis balloon. A 6-10 mm by 7 + 2 cm of Viatorr  endograft was placed. This was ultimately dilated to 6 mm. Repeat portal venogram demonstrated persistent hepatopetal flow with preferential opacification of the large gastric varices. A 7 French, 55 cm Tourguide sheath was directed to the central aspect of the splenic vein. Repeat venogram demonstrated a prominent left gastric and too prominent posterior gastric veins with hepatofugal flow into large gastric varices. The left gastric vein was then selected. Dedicated  angiogram demonstrated hepatofugal flow through multiple large varices draining via the gastro renal shunt. Coil embolization was then performed about the central aspect of the left gastric vein with an assortment of detachable 0.035  Azur coils. The catheter was then redirected into 1 of the posterior gastric veins in venogram demonstrated hepatopetal flow draining via the large gastro renal shunt. Coil embolization was then performed about the central aspect of the left gastric vein with an assortment of detachable 0.035 Azur coils. Next, the catheter was then redirected into an additional posterior gastric vein and venogram demonstrated hepatofugal flow draining via the large gastro renal shunt. Coil embolization was then performed about the central aspect of the left gastric vein with an assortment of detachable 0.035  Azur coils. Catheter was retracted into the splenic vein and venogram was performed which demonstrated persistent albeit significantly slowed hepatopetal flow through the gastric varices and a patent appearing portal vein. The coaxial catheter in sheath system was then removed from the portal vein directed to the inferior vena cava, left renal vein, and into the gastro renal shunt. A 5 French Fogarty balloon was then inserted and inflated in the central aspect of the gastro renal shunt with venogram demonstrating persistent patency of the gastric varices. The balloon was deflated in the contrast cleared via the left renal vein. The balloon was then reinflated and transvenous obliteration was performed with a 3-2-1 ratio of air, 3% Sotradecol , and lipiodol . Sclerosis at was administered under fluoroscopic visualization. With the balloon catheter in place, coil embolization was performed of the gastro renal shunt with an assortment of detachable 0.035 Azur coils. The balloon was then deflated slowly. Completion venogram demonstrated patency of the left renal vein. The catheters and sheaths were  removed and manual compression was applied to the right internal jugular and right common femoral venous access sites until hemostasis was achieved. The patient was transferred to the ICU in stable condition. Pre-TIPS Mean Pressures (mmHg): Right atrium: 13 Portal vein: 26 Portosystemic gradient: 13 Post-TIPS Mean Pressures (mmHg): Not obtained due to erroneous measurements from extensive gastric variceal embolization. IMPRESSION: 1. Successful transjugular portosystemic shunt creation. 2. Technically successful coil embolization of a the left gastric vein and 2 posterior gastric veins to exclude large gastric varices. 3. Technically successful coil assisted retrograde transvenous obliteration of large gastrorenal shunt. Ester Sides, MD [facility] Radiology Electronically Signed   By: Ester Sides M.D.   On: 10/11/2024 14:54   IR EMBO VENOUS NOT HEMORR HEMANG  INC GUIDE ROADMAPPING Result Date: 10/11/2024 CLINICAL DATA:  59 year old female with history of primary biliary cirrhosis complicated by gastric varices and gastro renal shunt presenting with gastric variceal hemorrhage. EXAM: 1. Ultrasound-guided access of the right internal jugular vein 2. Ultrasound-guided access of the right common femoral vein 3. Intravascular ultrasound 4. Catheterization of the portal vein 5. Portal venous and central manometry 6. Portal venogram 7. Creation of a transhepatic portal vein to hepatic vein shunt 8. Coil embolization of the left gastric and 2 posterior gastric veins 9. Coil assisted retrograde transvenous obliteration of  gastro renal shunt MEDICATIONS: As antibiotic prophylaxis, Rocephin  1 gm IV was ordered pre-procedure and administered intravenously within one hour of incision. ANESTHESIA/SEDATION: General - as administered by the Anesthesia department CONTRAST:  Fifty ML Omnipaque  300, intravenous FLUOROSCOPY TIME:  Four hundred eighty mGy reference air kerma  COMPLICATIONS: None immediate. PROCEDURE: Informed written consent was obtained from the patient after a thorough discussion of the procedural risks, benefits and alternatives. All questions were addressed. Maximal Sterile Barrier Technique was utilized including caps, mask, sterile gowns, sterile gloves, sterile drape, hand hygiene and skin antiseptic. A timeout was performed prior to the initiation of the procedure. A preliminary ultrasound of the right groin was performed and demonstrates a patent right common femoral vein. A permanent ultrasound image was recorded. Using a combination of fluoroscopy and ultrasound, an access site was determined. A small dermatotomy was made at the planned puncture site. Using ultrasound guidance, access into the right common femoral vein was obtained with visualization of needle entry into the vessel using a standard micropuncture technique. A wire was advanced into the IVC insert all fascial dilation performed. An 8 French, 11 cm vascular sheath was placed into the external iliac vein. Through this access site, an 57 French Accunav ICE catheter was advanced with ease under fluoroscopic guidance to the level of the intrahepatic inferior vena cava. A preliminary ultrasound of the right neck was performed and demonstrates a patent internal jugular vein. A permanent ultrasound image was recorded. Using a combination of fluoroscopy and ultrasound, an access site was determined. A small dermatotomy was made at the planned puncture site. Using ultrasound guidance, access into the right internal jugular vein was obtained with visualization of needle entry into the vessel using a standard micropuncture technique. A wire was advanced into the IVC and serial fascial dilation performed. A 10 French tips sheath was placed into the internal jugular vein and advanced to the IVC. The jugular sheath was retracted into the right atrium and manometry was performed. A 5 French angled tip catheter  was then directed into the middle hepatic vein. The catheter was advanced to a wedge portion of the a patent vein over which the 10 French sheath was advanced into the middle hepatic vein. Using ICE ultrasound visualization the catheter within the middle hepatic vein as well as the portal anatomy was defined. A planned exit site from the hepatic vein and puncture site from the portal vein was placed into a single sonographic plane. Under direct ultrasound visualization, the ScorpionX needle was advanced into the central right portal vein. Hand injection of contrast confirmed position within the portal system. A Glidewire Advantage was then advanced into the splenic vein. A 5 French marking pigtail catheter was then advanced over the wire into the main portal vein and wire removed. Portal venogram was performed which demonstrated a patent portal vein with hepatofugal flow. Multiple large varices were seen arising from the main portal vein. Portal manometry was then performed. The tract was then dilated to 6 mm with an 6 mm x 8 cm Athletis balloon. A 6-10 mm by 7 + 2 cm of Viatorr endograft was placed. This was ultimately dilated to 6 mm. Repeat portal venogram demonstrated persistent hepatopetal flow with preferential opacification of the large gastric varices. A 7 French, 55 cm Tourguide sheath was directed to the central aspect of the splenic vein. Repeat venogram demonstrated a prominent left gastric and too prominent posterior gastric veins with hepatofugal flow into large gastric varices. The left gastric vein was  then selected. Dedicated angiogram demonstrated hepatofugal flow through multiple large varices draining via the gastro renal shunt. Coil embolization was then performed about the central aspect of the left gastric vein with an assortment of detachable 0.035  Azur coils. The catheter was then redirected into 1 of the posterior gastric veins in venogram demonstrated hepatopetal flow draining via the  large gastro renal shunt. Coil embolization was then performed about the central aspect of the left gastric vein with an assortment of detachable 0.035 Azur coils. Next, the catheter was then redirected into an additional posterior gastric vein and venogram demonstrated hepatofugal flow draining via the large gastro renal shunt. Coil embolization was then performed about the central aspect of the left gastric vein with an assortment of detachable 0.035  Azur coils. Catheter was retracted into the splenic vein and venogram was performed which demonstrated persistent albeit significantly slowed hepatopetal flow through the gastric varices and a patent appearing portal vein. The coaxial catheter in sheath system was then removed from the portal vein directed to the inferior vena cava, left renal vein, and into the gastro renal shunt. A 5 French Fogarty balloon was then inserted and inflated in the central aspect of the gastro renal shunt with venogram demonstrating persistent patency of the gastric varices. The balloon was deflated in the contrast cleared via the left renal vein. The balloon was then reinflated and transvenous obliteration was performed with a 3-2-1 ratio of air, 3% Sotradecol , and lipiodol . Sclerosis at was administered under fluoroscopic visualization. With the balloon catheter in place, coil embolization was performed of the gastro renal shunt with an assortment of detachable 0.035 Azur coils. The balloon was then deflated slowly. Completion venogram demonstrated patency of the left renal vein. The catheters and sheaths were removed and manual compression was applied to the right internal jugular and right common femoral venous access sites until hemostasis was achieved. The patient was transferred to the ICU in stable condition. Pre-TIPS Mean Pressures (mmHg): Right atrium: 13 Portal vein: 26 Portosystemic gradient: 13 Post-TIPS Mean Pressures (mmHg): Not obtained due to erroneous measurements  from extensive gastric variceal embolization. IMPRESSION: 1. Successful transjugular portosystemic shunt creation. 2. Technically successful coil embolization of a the left gastric vein and 2 posterior gastric veins to exclude large gastric varices. 3. Technically successful coil assisted retrograde transvenous obliteration of large gastrorenal shunt. Ester Sides, MD [facility] Radiology Electronically Signed   By: Ester Sides M.D.   On: 10/11/2024 14:54   IR US  Guide Vasc Access Right Result Date: 10/11/2024 CLINICAL DATA:  59 year old female with history of primary biliary cirrhosis complicated by gastric varices and gastro renal shunt presenting with gastric variceal hemorrhage. EXAM: 1. Ultrasound-guided access of the right internal jugular vein 2. Ultrasound-guided access of the right common femoral vein 3. Intravascular ultrasound 4. Catheterization of the portal vein 5. Portal venous and central manometry 6. Portal venogram 7. Creation of a transhepatic portal vein to hepatic vein shunt 8. Coil embolization of the left gastric and 2 posterior gastric veins 9. Coil assisted retrograde transvenous obliteration of gastro renal shunt MEDICATIONS: As antibiotic prophylaxis, Rocephin  1 gm IV was ordered pre-procedure and administered intravenously within one hour of incision. ANESTHESIA/SEDATION: General - as administered by the Anesthesia department CONTRAST:  Fifty ML Omnipaque  300, intravenous FLUOROSCOPY TIME:  Four hundred eighty mGy reference air kerma COMPLICATIONS: None immediate. PROCEDURE: Informed written consent was obtained from the patient after a thorough discussion of the procedural risks, benefits and alternatives.  All questions were addressed. Maximal Sterile Barrier Technique was utilized including caps, mask, sterile gowns, sterile gloves, sterile drape, hand hygiene and skin antiseptic. A timeout was performed prior to the initiation of  the procedure. A preliminary ultrasound of the right groin was performed and demonstrates a patent right common femoral vein. A permanent ultrasound image was recorded. Using a combination of fluoroscopy and ultrasound, an access site was determined. A small dermatotomy was made at the planned puncture site. Using ultrasound guidance, access into the right common femoral vein was obtained with visualization of needle entry into the vessel using a standard micropuncture technique. A wire was advanced into the IVC insert all fascial dilation performed. An 8 French, 11 cm vascular sheath was placed into the external iliac vein. Through this access site, an 26 French Accunav ICE catheter was advanced with ease under fluoroscopic guidance to the level of the intrahepatic inferior vena cava. A preliminary ultrasound of the right neck was performed and demonstrates a patent internal jugular vein. A permanent ultrasound image was recorded. Using a combination of fluoroscopy and ultrasound, an access site was determined. A small dermatotomy was made at the planned puncture site. Using ultrasound guidance, access into the right internal jugular vein was obtained with visualization of needle entry into the vessel using a standard micropuncture technique. A wire was advanced into the IVC and serial fascial dilation performed. A 10 French tips sheath was placed into the internal jugular vein and advanced to the IVC. The jugular sheath was retracted into the right atrium and manometry was performed. A 5 French angled tip catheter was then directed into the middle hepatic vein. The catheter was advanced to a wedge portion of the a patent vein over which the 10 French sheath was advanced into the middle hepatic vein. Using ICE ultrasound visualization the catheter within the middle hepatic vein as well as the portal anatomy was defined. A planned exit site from the hepatic vein and puncture site from the portal vein was placed into  a single sonographic plane. Under direct ultrasound visualization, the ScorpionX needle was advanced into the central right portal vein. Hand injection of contrast confirmed position within the portal system. A Glidewire Advantage was then advanced into the splenic vein. A 5 French marking pigtail catheter was then advanced over the wire into the main portal vein and wire removed. Portal venogram was performed which demonstrated a patent portal vein with hepatofugal flow. Multiple large varices were seen arising from the main portal vein. Portal manometry was then performed. The tract was then dilated to 6 mm with an 6 mm x 8 cm Athletis balloon. A 6-10 mm by 7 + 2 cm of Viatorr endograft was placed. This was ultimately dilated to 6 mm. Repeat portal venogram demonstrated persistent hepatopetal flow with preferential opacification of the large gastric varices. A 7 French, 55 cm Tourguide sheath was directed to the central aspect of the splenic vein. Repeat venogram demonstrated a prominent left gastric and too prominent posterior gastric veins with hepatofugal flow into large gastric varices. The left gastric vein was then selected. Dedicated angiogram demonstrated hepatofugal flow through multiple large varices draining via the gastro renal shunt. Coil embolization was then performed about the central aspect of the left gastric vein with an assortment of detachable 0.035  Azur coils. The catheter was then redirected into 1 of the posterior gastric veins in venogram demonstrated hepatopetal flow draining via the large gastro renal shunt. Coil embolization was then performed about the  central aspect of the left gastric vein with an assortment of detachable 0.035 Azur coils. Next, the catheter was then redirected into an additional posterior gastric vein and venogram demonstrated hepatofugal flow draining via the large gastro renal shunt. Coil embolization was then performed about the central aspect of the left  gastric vein with an assortment of detachable 0.035  Azur coils. Catheter was retracted into the splenic vein and venogram was performed which demonstrated persistent albeit significantly slowed hepatopetal flow through the gastric varices and a patent appearing portal vein. The coaxial catheter in sheath system was then removed from the portal vein directed to the inferior vena cava, left renal vein, and into the gastro renal shunt. A 5 French Fogarty balloon was then inserted and inflated in the central aspect of the gastro renal shunt with venogram demonstrating persistent patency of the gastric varices. The balloon was deflated in the contrast cleared via the left renal vein. The balloon was then reinflated and transvenous obliteration was performed with a 3-2-1 ratio of air, 3% Sotradecol , and lipiodol . Sclerosis at was administered under fluoroscopic visualization. With the balloon catheter in place, coil embolization was performed of the gastro renal shunt with an assortment of detachable 0.035 Azur coils. The balloon was then deflated slowly. Completion venogram demonstrated patency of the left renal vein. The catheters and sheaths were removed and manual compression was applied to the right internal jugular and right common femoral venous access sites until hemostasis was achieved. The patient was transferred to the ICU in stable condition. Pre-TIPS Mean Pressures (mmHg): Right atrium: 13 Portal vein: 26 Portosystemic gradient: 13 Post-TIPS Mean Pressures (mmHg): Not obtained due to erroneous measurements from extensive gastric variceal embolization. IMPRESSION: 1. Successful transjugular portosystemic shunt creation. 2. Technically successful coil embolization of a the left gastric vein and 2 posterior gastric veins to exclude large gastric varices. 3. Technically successful coil assisted retrograde transvenous obliteration of large gastrorenal shunt. Ester Sides, MD [facility] Radiology Electronically Signed   By: Ester Sides M.D.   On: 10/11/2024 14:54   IR US  Guide Vasc Access Right Result Date: 10/11/2024 CLINICAL DATA:  59 year old female with history of primary biliary cirrhosis complicated by gastric varices and gastro renal shunt presenting with gastric variceal hemorrhage. EXAM: 1. Ultrasound-guided access of the right internal jugular vein 2. Ultrasound-guided access of the right common femoral vein 3. Intravascular ultrasound 4. Catheterization of the portal vein 5. Portal venous and central manometry 6. Portal venogram 7. Creation of a transhepatic portal vein to hepatic vein shunt 8. Coil embolization of the left gastric and 2 posterior gastric veins 9. Coil assisted retrograde transvenous obliteration of gastro renal shunt MEDICATIONS: As antibiotic prophylaxis, Rocephin  1 gm IV was ordered pre-procedure and administered intravenously within one hour of incision. ANESTHESIA/SEDATION: General - as administered by the Anesthesia department CONTRAST:  Fifty ML Omnipaque  300, intravenous FLUOROSCOPY TIME:  Four hundred eighty mGy reference air kerma COMPLICATIONS: None immediate. PROCEDURE: Informed written consent was obtained from the patient after a thorough discussion of the procedural risks, benefits and alternatives. All questions were addressed. Maximal Sterile Barrier Technique was utilized including caps, mask, sterile gowns, sterile gloves, sterile drape, hand hygiene and skin antiseptic. A timeout was performed prior to the initiation of the procedure. A preliminary ultrasound of the right groin was performed and demonstrates a patent right common femoral vein. A permanent ultrasound image was recorded. Using a combination of fluoroscopy and ultrasound, an access site was determined. A  small dermatotomy was made at the planned puncture site. Using ultrasound guidance, access into the right common femoral vein was obtained with  visualization of needle entry into the vessel using a standard micropuncture technique. A wire was advanced into the IVC insert all fascial dilation performed. An 8 French, 11 cm vascular sheath was placed into the external iliac vein. Through this access site, an 63 French Accunav ICE catheter was advanced with ease under fluoroscopic guidance to the level of the intrahepatic inferior vena cava. A preliminary ultrasound of the right neck was performed and demonstrates a patent internal jugular vein. A permanent ultrasound image was recorded. Using a combination of fluoroscopy and ultrasound, an access site was determined. A small dermatotomy was made at the planned puncture site. Using ultrasound guidance, access into the right internal jugular vein was obtained with visualization of needle entry into the vessel using a standard micropuncture technique. A wire was advanced into the IVC and serial fascial dilation performed. A 10 French tips sheath was placed into the internal jugular vein and advanced to the IVC. The jugular sheath was retracted into the right atrium and manometry was performed. A 5 French angled tip catheter was then directed into the middle hepatic vein. The catheter was advanced to a wedge portion of the a patent vein over which the 10 French sheath was advanced into the middle hepatic vein. Using ICE ultrasound visualization the catheter within the middle hepatic vein as well as the portal anatomy was defined. A planned exit site from the hepatic vein and puncture site from the portal vein was placed into a single sonographic plane. Under direct ultrasound visualization, the ScorpionX needle was advanced into the central right portal vein. Hand injection of contrast confirmed position within the portal system. A Glidewire Advantage was then advanced into the splenic vein. A 5 French marking pigtail catheter was then advanced over the wire into the main portal vein and wire removed. Portal  venogram was performed which demonstrated a patent portal vein with hepatofugal flow. Multiple large varices were seen arising from the main portal vein. Portal manometry was then performed. The tract was then dilated to 6 mm with an 6 mm x 8 cm Athletis balloon. A 6-10 mm by 7 + 2 cm of Viatorr endograft was placed. This was ultimately dilated to 6 mm. Repeat portal venogram demonstrated persistent hepatopetal flow with preferential opacification of the large gastric varices. A 7 French, 55 cm Tourguide sheath was directed to the central aspect of the splenic vein. Repeat venogram demonstrated a prominent left gastric and too prominent posterior gastric veins with hepatofugal flow into large gastric varices. The left gastric vein was then selected. Dedicated angiogram demonstrated hepatofugal flow through multiple large varices draining via the gastro renal shunt. Coil embolization was then performed about the central aspect of the left gastric vein with an assortment of detachable 0.035  Azur coils. The catheter was then redirected into 1 of the posterior gastric veins in venogram demonstrated hepatopetal flow draining via the large gastro renal shunt. Coil embolization was then performed about the central aspect of the left gastric vein with an assortment of detachable 0.035 Azur coils. Next, the catheter was then redirected into an additional posterior gastric vein and venogram demonstrated hepatofugal flow draining via the large gastro renal shunt. Coil embolization was then performed about the central aspect of the left gastric vein with an assortment of detachable 0.035  Azur coils. Catheter was retracted into the splenic vein and venogram  was performed which demonstrated persistent albeit significantly slowed hepatopetal flow through the gastric varices and a patent appearing portal vein. The coaxial catheter in sheath system was then removed from the portal vein directed to the inferior vena cava, left  renal vein, and into the gastro renal shunt. A 5 French Fogarty balloon was then inserted and inflated in the central aspect of the gastro renal shunt with venogram demonstrating persistent patency of the gastric varices. The balloon was deflated in the contrast cleared via the left renal vein. The balloon was then reinflated and transvenous obliteration was performed with a 3-2-1 ratio of air, 3% Sotradecol , and lipiodol . Sclerosis at was administered under fluoroscopic visualization. With the balloon catheter in place, coil embolization was performed of the gastro renal shunt with an assortment of detachable 0.035 Azur coils. The balloon was then deflated slowly. Completion venogram demonstrated patency of the left renal vein. The catheters and sheaths were removed and manual compression was applied to the right internal jugular and right common femoral venous access sites until hemostasis was achieved. The patient was transferred to the ICU in stable condition. Pre-TIPS Mean Pressures (mmHg): Right atrium: 13 Portal vein: 26 Portosystemic gradient: 13 Post-TIPS Mean Pressures (mmHg): Not obtained due to erroneous measurements from extensive gastric variceal embolization. IMPRESSION: 1. Successful transjugular portosystemic shunt creation. 2. Technically successful coil embolization of a the left gastric vein and 2 posterior gastric veins to exclude large gastric varices. 3. Technically successful coil assisted retrograde transvenous obliteration of large gastrorenal shunt. Ester Sides, MD [facility] Radiology Electronically Signed   By: Ester Sides M.D.   On: 10/11/2024 14:54   IR VEIN SCLEROSING SINGLE INCOMPOTENT Result Date: 10/11/2024 CLINICAL DATA:  59 year old female with history of primary biliary cirrhosis complicated by gastric varices and gastro renal shunt presenting with gastric variceal hemorrhage. EXAM: 1. Ultrasound-guided access of the right  internal jugular vein 2. Ultrasound-guided access of the right common femoral vein 3. Intravascular ultrasound 4. Catheterization of the portal vein 5. Portal venous and central manometry 6. Portal venogram 7. Creation of a transhepatic portal vein to hepatic vein shunt 8. Coil embolization of the left gastric and 2 posterior gastric veins 9. Coil assisted retrograde transvenous obliteration of gastro renal shunt MEDICATIONS: As antibiotic prophylaxis, Rocephin  1 gm IV was ordered pre-procedure and administered intravenously within one hour of incision. ANESTHESIA/SEDATION: General - as administered by the Anesthesia department CONTRAST:  Fifty ML Omnipaque  300, intravenous FLUOROSCOPY TIME:  Four hundred eighty mGy reference air kerma COMPLICATIONS: None immediate. PROCEDURE: Informed written consent was obtained from the patient after a thorough discussion of the procedural risks, benefits and alternatives. All questions were addressed. Maximal Sterile Barrier Technique was utilized including caps, mask, sterile gowns, sterile gloves, sterile drape, hand hygiene and skin antiseptic. A timeout was performed prior to the initiation of the procedure. A preliminary ultrasound of the right groin was performed and demonstrates a patent right common femoral vein. A permanent ultrasound image was recorded. Using a combination of fluoroscopy and ultrasound, an access site was determined. A small dermatotomy was made at the planned puncture site. Using ultrasound guidance, access into the right common femoral vein was obtained with visualization of needle entry into the vessel using a standard micropuncture technique. A wire was advanced into the IVC insert all fascial dilation performed. An 8 French, 11 cm vascular sheath was placed into the external iliac vein. Through this access site, an 24 French Accunav ICE catheter was advanced  with ease under fluoroscopic guidance to the level of the intrahepatic inferior vena cava.  A preliminary ultrasound of the right neck was performed and demonstrates a patent internal jugular vein. A permanent ultrasound image was recorded. Using a combination of fluoroscopy and ultrasound, an access site was determined. A small dermatotomy was made at the planned puncture site. Using ultrasound guidance, access into the right internal jugular vein was obtained with visualization of needle entry into the vessel using a standard micropuncture technique. A wire was advanced into the IVC and serial fascial dilation performed. A 10 French tips sheath was placed into the internal jugular vein and advanced to the IVC. The jugular sheath was retracted into the right atrium and manometry was performed. A 5 French angled tip catheter was then directed into the middle hepatic vein. The catheter was advanced to a wedge portion of the a patent vein over which the 10 French sheath was advanced into the middle hepatic vein. Using ICE ultrasound visualization the catheter within the middle hepatic vein as well as the portal anatomy was defined. A planned exit site from the hepatic vein and puncture site from the portal vein was placed into a single sonographic plane. Under direct ultrasound visualization, the ScorpionX needle was advanced into the central right portal vein. Hand injection of contrast confirmed position within the portal system. A Glidewire Advantage was then advanced into the splenic vein. A 5 French marking pigtail catheter was then advanced over the wire into the main portal vein and wire removed. Portal venogram was performed which demonstrated a patent portal vein with hepatofugal flow. Multiple large varices were seen arising from the main portal vein. Portal manometry was then performed. The tract was then dilated to 6 mm with an 6 mm x 8 cm Athletis balloon. A 6-10 mm by 7 + 2 cm of Viatorr endograft was placed. This was ultimately dilated to 6 mm. Repeat portal venogram demonstrated persistent  hepatopetal flow with preferential opacification of the large gastric varices. A 7 French, 55 cm Tourguide sheath was directed to the central aspect of the splenic vein. Repeat venogram demonstrated a prominent left gastric and too prominent posterior gastric veins with hepatofugal flow into large gastric varices. The left gastric vein was then selected. Dedicated angiogram demonstrated hepatofugal flow through multiple large varices draining via the gastro renal shunt. Coil embolization was then performed about the central aspect of the left gastric vein with an assortment of detachable 0.035  Azur coils. The catheter was then redirected into 1 of the posterior gastric veins in venogram demonstrated hepatopetal flow draining via the large gastro renal shunt. Coil embolization was then performed about the central aspect of the left gastric vein with an assortment of detachable 0.035 Azur coils. Next, the catheter was then redirected into an additional posterior gastric vein and venogram demonstrated hepatofugal flow draining via the large gastro renal shunt. Coil embolization was then performed about the central aspect of the left gastric vein with an assortment of detachable 0.035  Azur coils. Catheter was retracted into the splenic vein and venogram was performed which demonstrated persistent albeit significantly slowed hepatopetal flow through the gastric varices and a patent appearing portal vein. The coaxial catheter in sheath system was then removed from the portal vein directed to the inferior vena cava, left renal vein, and into the gastro renal shunt. A 5 French Fogarty balloon was then inserted and inflated in the central aspect of the gastro renal shunt with venogram demonstrating persistent patency  of the gastric varices. The balloon was deflated in the contrast cleared via the left renal vein. The balloon was then reinflated and transvenous obliteration was performed with a 3-2-1 ratio of air, 3%  Sotradecol , and lipiodol . Sclerosis at was administered under fluoroscopic visualization. With the balloon catheter in place, coil embolization was performed of the gastro renal shunt with an assortment of detachable 0.035 Azur coils. The balloon was then deflated slowly. Completion venogram demonstrated patency of the left renal vein. The catheters and sheaths were removed and manual compression was applied to the right internal jugular and right common femoral venous access sites until hemostasis was achieved. The patient was transferred to the ICU in stable condition. Pre-TIPS Mean Pressures (mmHg): Right atrium: 13 Portal vein: 26 Portosystemic gradient: 13 Post-TIPS Mean Pressures (mmHg): Not obtained due to erroneous measurements from extensive gastric variceal embolization. IMPRESSION: 1. Successful transjugular portosystemic shunt creation. 2. Technically successful coil embolization of a the left gastric vein and 2 posterior gastric veins to exclude large gastric varices. 3. Technically successful coil assisted retrograde transvenous obliteration of large gastrorenal shunt. Ester Sides, MD [facility] Inter[facility] Rad[facility] Radiology Electronically Signed   By: Ester Sides M.D.   On: 10/11/2024 14:54   IR INTRAVASCULAR ULTRASOUND NON CORONARY Result Date: 10/11/2024 CLINICAL DATA:  59 year old female with history of primary biliary cirrhosis complicated by gastric varices and gastro renal shunt presenting with gastric variceal hemorrhage. EXAM: 1. Ultrasound-guided access of the right internal jugular vein 2. Ultrasound-guided access of the right common femoral vein 3. Intravascular ultrasound 4. Catheterization of the portal vein 5. Portal venous and central manometry 6. Portal venogram 7. Creation of a transhepatic portal vein to hepatic vein shunt 8. Coil embolization of the left gastric and 2 posterior gastric veins 9. Coil assisted retrograde transvenous obliteration of  gastro renal shunt MEDICATIONS: As antibiotic prophylaxis, Rocephin  1 gm IV was ordered pre-procedure and administered intravenously within one hour of incision. ANESTHESIA/SEDATION: General - as administered by the Anesthesia department CONTRAST:  Fifty ML Omnipaque  300, intravenous FLUOROSCOPY TIME:  Four hundred eighty mGy reference air kerma COMPLICATIONS: None immediate. PROCEDURE: Informed written consent was obtained from the patient after a thorough discussion of the procedural risks, benefits and alternatives. All questions were addressed. Maximal Sterile Barrier Technique was utilized including caps, mask, sterile gowns, sterile gloves, sterile drape, hand hygiene and skin antiseptic. A timeout was performed prior to the initiation of the procedure. A preliminary ultrasound of the right groin was performed and demonstrates a patent right common femoral vein. A permanent ultrasound image was recorded. Using a combination of fluoroscopy and ultrasound, an access site was determined. A small dermatotomy was made at the planned puncture site. Using ultrasound guidance, access into the right common femoral vein was obtained with visualization of needle entry into the vessel using a standard micropuncture technique. A wire was advanced into the IVC insert all fascial dilation performed. An 8 French, 11 cm vascular sheath was placed into the external iliac vein. Through this access site, an 64 French Accunav ICE catheter was advanced with ease under fluoroscopic guidance to the level of the intrahepatic inferior vena cava. A preliminary ultrasound of the right neck was performed and demonstrates a patent internal jugular vein. A permanent ultrasound image was recorded. Using a combination of fluoroscopy and ultrasound, an access site was determined. A small dermatotomy was made at the planned puncture site. Using ultrasound guidance, access into the right internal jugular vein was obtained with visualization  of  needle entry into the vessel using a standard micropuncture technique. A wire was advanced into the IVC and serial fascial dilation performed. A 10 French tips sheath was placed into the internal jugular vein and advanced to the IVC. The jugular sheath was retracted into the right atrium and manometry was performed. A 5 French angled tip catheter was then directed into the middle hepatic vein. The catheter was advanced to a wedge portion of the a patent vein over which the 10 French sheath was advanced into the middle hepatic vein. Using ICE ultrasound visualization the catheter within the middle hepatic vein as well as the portal anatomy was defined. A planned exit site from the hepatic vein and puncture site from the portal vein was placed into a single sonographic plane. Under direct ultrasound visualization, the ScorpionX needle was advanced into the central right portal vein. Hand injection of contrast confirmed position within the portal system. A Glidewire Advantage was then advanced into the splenic vein. A 5 French marking pigtail catheter was then advanced over the wire into the main portal vein and wire removed. Portal venogram was performed which demonstrated a patent portal vein with hepatofugal flow. Multiple large varices were seen arising from the main portal vein. Portal manometry was then performed. The tract was then dilated to 6 mm with an 6 mm x 8 cm Athletis balloon. A 6-10 mm by 7 + 2 cm of Viatorr endograft was placed. This was ultimately dilated to 6 mm. Repeat portal venogram demonstrated persistent hepatopetal flow with preferential opacification of the large gastric varices. A 7 French, 55 cm Tourguide sheath was directed to the central aspect of the splenic vein. Repeat venogram demonstrated a prominent left gastric and too prominent posterior gastric veins with hepatofugal flow into large gastric varices. The left gastric vein was then selected. Dedicated angiogram demonstrated  hepatofugal flow through multiple large varices draining via the gastro renal shunt. Coil embolization was then performed about the central aspect of the left gastric vein with an assortment of detachable 0.035  Azur coils. The catheter was then redirected into 1 of the posterior gastric veins in venogram demonstrated hepatopetal flow draining via the large gastro renal shunt. Coil embolization was then performed about the central aspect of the left gastric vein with an assortment of detachable 0.035 Azur coils. Next, the catheter was then redirected into an additional posterior gastric vein and venogram demonstrated hepatofugal flow draining via the large gastro renal shunt. Coil embolization was then performed about the central aspect of the left gastric vein with an assortment of detachable 0.035  Azur coils. Catheter was retracted into the splenic vein and venogram was performed which demonstrated persistent albeit significantly slowed hepatopetal flow through the gastric varices and a patent appearing portal vein. The coaxial catheter in sheath system was then removed from the portal vein directed to the inferior vena cava, left renal vein, and into the gastro renal shunt. A 5 French Fogarty balloon was then inserted and inflated in the central aspect of the gastro renal shunt with venogram demonstrating persistent patency of the gastric varices. The balloon was deflated in the contrast cleared via the left renal vein. The balloon was then reinflated and transvenous obliteration was performed with a 3-2-1 ratio of air, 3% Sotradecol , and lipiodol . Sclerosis at was administered under fluoroscopic visualization. With the balloon catheter in place, coil embolization was performed of the gastro renal shunt with an assortment of detachable 0.035 Azur coils. The balloon was then deflated  slowly. Completion venogram demonstrated patency of the left renal vein. The catheters and sheaths were removed and manual  compression was applied to the right internal jugular and right common femoral venous access sites until hemostasis was achieved. The patient was transferred to the ICU in stable condition. Pre-TIPS Mean Pressures (mmHg): Right atrium: 13 Portal vein: 26 Portosystemic gradient: 13 Post-TIPS Mean Pressures (mmHg): Not obtained due to erroneous measurements from extensive gastric variceal embolization. IMPRESSION: 1. Successful transjugular portosystemic shunt creation. 2. Technically successful coil embolization of a the left gastric vein and 2 posterior gastric veins to exclude large gastric varices. 3. Technically successful coil assisted retrograde transvenous obliteration of large gastrorenal shunt. Ester Sides, MD [facility] Inter[facility] Radiology Special[facility] Radiology Electronically Signed   By: Ester Sides M.D.   On: 10/11/2024 14:54   DG CHEST PORT 1 VIEW Result Date: 10/11/2024 EXAM: 1 VIEW(S) XRAY OF THE CHEST 10/11/2024 07:46:00 AM COMPARISON: 08/28/2024 CLINICAL HISTORY: Hypoxia. FINDINGS: LINES, TUBES AND DEVICES: Endotracheal tube in place with tip 3.1 cm above the carina. LUNGS AND PLEURA: Mildly decreased lung volumes. Mild left basilar linear subsegmental atelectasis. No pleural effusion. No pneumothorax. HEART AND MEDIASTINUM: No acute abnormality of the cardiac and mediastinal silhouettes. BONES AND SOFT TISSUES: No acute osseous abnormality. Left upper quadrant embolization coils noted. IMPRESSION: 1. Endotracheal tube tip 3.1 cm above the carina. 2. Mild left basilar linear subsegmental atelectasis. 3. Left upper quadrant embolization coils. Electronically signed by: Evalene Coho MD 10/11/2024 07:50 AM EST RP Workstation: [id number]    Endoscopic Studies: EGD 10/10/2024 - no esophageal varices, clotted blood in the cardia, gastric fundus and in the gastric body, large amount of type I isolated gastric varix in the cardia and fundus with no active bleeding but did have  stigmata of recent bleeding and hematoma, medium in size     Clinical Impression   59 year old female with a past medical history of autoimmune liver cirrhosis, hepatic encephalopathy, gastric varices, primary biliary cholangitis, hypertension, hyperlipidemia, obesity who presented to the hospital with multiple episodes of bloody emesis.   EGD 10/10/2024 - no esophageal varices, clotted blood in the cardia, gastric fundus and in the gastric body, large amount of type I isolated gastric varix in the cardia and fundus with no active bleeding but did have stigmata of recent bleeding and hematoma, medium in size  CTAP with new finding of spontaneous splenorenal shunt   10/10/2024  status post TIPS, coil embolization of posterior left gastric veins and coil assisted retrograde transvenous obliteration of gastrorenal shunt  Ms. Heimsoth has been clinically stable s/p TIPS /BRTO without evidence of recurrent bleeding.  Hemoglobin stable today at 8.2.  She is extubated and no longer on pressor support.  She has been somnolent and manifesting symptoms of hepatic encephalopathy likely exacerbated status post TIPS.  Ammonia has decreased with lactulose , rifaximin  and zinc .  She has evidence of leukocytosis which is likely reactive in nature.  Currently afebrile.  Labs noteworthy for AKI with BUN 79/ Cr 2.65 -multifactorial related to shock, possible contrast nephropathy   Plan  Continue lactulose  30 mL 3 times daily-4 times daily, goal of at least 3 bowel movements per day Continue rifaximin  550 mg p.o. twice daily Continue zinc  220 mg daily Monitor serial H&H and maintain hemoglobin greater than 7 D/C octreotide  today Complete 5-day course of ceftriaxone  TIPS care per IR Consider nephrology consultation in setting of ongoing AKI Continue clear liquid diet and advance as tolerated Follow-up with [facility]/ [name],  NP after hospitalization   LOS: 2 days   [name]  10/12/2024,  10:48 AM  [name], MD [facility]  "

## 2024-10-12 NOTE — Progress Notes (Signed)
 "  NAME:  Susan [name], MRN:  [id number], DOB:  1965-10-03, LOS: 2 ADMISSION DATE:  10/09/2024, CONSULTATION DATE:  10/12/24 REFERRING MD:  MELODIE, CHIEF COMPLAINT:  UGIB   History of Present Illness:  59 year old female with a past medical history of autoimmune liver cirrhosis, hepatic encephalopathy, esophageal varices, primary biliary cholangitis, hypertension, hyperlipidemia, obesity who presented to the hospital with multiple episodes of bloody emesis.   Of note, patient was recently admitted to the hospital and December 2025 with hepatic encephalopathy and is known to have esophageal varices.  In the ER, she was noted to have 1 more episode of bloody emesis of dark blood.  Her vitals otherwise have been stable and she is hemodynamically stable.  Laboratory workup significant for anemia with a hemoglobin of 10.7 on presentation with a baseline of 13.  Repeat hemoglobin check with drop to 9.2.  She is currently receiving on unit of blood.  CT head without any acute abnormality.  CT abdomen and pelvis showing nodular liver compatible with cirrhosis and moderately distended stomach with fluid and debris as well as mild wall thickening in the right colon.  No ascites noted.  Pertinent  Medical History  As above  Significant Hospital Events: Including procedures, antibiotic start and stop dates in addition to other pertinent events   10/10/2024 admitted to the ICU for UGIB. Underwent EGD and then IR TIPS and coil embolization of posterior and left gastric veins with coil assisted retrograde transvenous obliteration of gastrorenal shunt. 10/11/2024: passed SBT this AM and extubated  Interim History / Subjective:  Afebrile, HR 90 - 100s, hemodynamics stable off pressors, SPO2 > 90% on RA I/O +200 mL Drips: octreotide  drip  Objective    Blood pressure (!) 110/58, pulse 93, temperature 98.3 F (36.8 C), temperature source Oral, resp. rate 18, height 5' (1.524 m), weight 86.4 kg, SpO2 100%.         Intake/Output Summary (Last 24 hours) at 10/12/2024 0913 Last data filed at 10/12/2024 0900 Gross per 24 hour  Intake 975.43 ml  Output 860 ml  Net 115.43 ml   Filed Weights   10/10/24 0400 10/11/24 0706 10/12/24 0500  Weight: 84.2 kg 85.5 kg 86.4 kg    Examination: General: awake, well appearing. Follows commands Eyes: PERRL, no scleral icterus Skin: warm, intact, no rashes Neck: JVD flat, ROM and lymph node assessment normal CV: RRR, no MRG, nl S1 and S2, no edema Resp: CTAB Abdom: Normoactive bowel sounds, soft, nontender, nondistended, no hepatosplenomegaly Neuro: Alert and oriented x 3  Resolved problem list   Assessment and Plan  #Hemorrhagic Shock: 2/2 variceal bleeding now s/p IR embolization and TIPs procedure; RESOLVED. - 2 large bore IV - D/C Octreotide  drip - PPI once daily since she does not have PUD - Q 12 H CBC - Coreg  3.125 mg BID - Target MAP > 65 mmHg  #Acute Hypoxic Respiratory Failure: extubated on 1/18 and now on RA. RESOLVED.  #SBP prophylaxis: - Ceftriaxone  1 g IV daily (end date 1/21)  #Stress Ulcer PPx: PPI daily  #Nutrition: clear liquid diet  #Hyperammonemia: Patient not confused and without asterixes. She is at risk for hepatic encephalopathy due to liver disease and TIPs procedure - Lactulose  20 mg BID - Rifaximin  - Titrate to 3 BMs daily - Ammonia level down to 42  #Autoimmune Liver Cirrhosis: MELD Score 10, 6% estimated 3 month mortality - No evidence of hepatic encephalopathy - Follow up with hepatology as an OP.  #AKI: likely prerenal  due to shock state. - Supportive care - Avoid nephrotoxins - Strict I/Os  #VTE ppx: SCDs, heparin  5000 units TID  #DM: - Moderate SSI  Disposition: Downgrade to the floor.  Patient Lines/Drains/Airways Status     Active Line/Drains/Airways     Name Placement date Placement time Site Days   Arterial Line 10/10/24 Left Radial 10/10/24  1250  Radial  2   Peripheral IV 10/10/24 20 G  Anterior;Distal;Left Forearm 10/10/24  0205  Forearm  2   Urethral Catheter [place]  Bailey, NT Latex 14 Fr. 10/10/24  1800  Latex  2            Labs   CBC: Recent Labs  Lab 10/10/24 1949 10/11/24 0905 10/11/24 1505 10/11/24 2209 10/12/24 0557  WBC 22.9* 21.3* 16.5* 16.9* 19.4*  NEUTROABS 16.8* 14.5* 11.3* 11.6* 13.3*  HGB 10.0* 8.8* 8.4* 8.0* 8.2*  HCT 28.9* 25.8* 24.3* 22.9* 23.9*  MCV 92.0 91.5 90.7 90.9 91.6  PLT 224 139* 103* 97* 114*    Basic Metabolic Panel: Recent Labs  Lab 10/09/24 2348 10/10/24 0754 10/10/24 1228 10/10/24 1949 10/11/24 0905 10/12/24 0551 10/12/24 0557  NA 137 137 138 136 137  --  136  K 4.7 4.9 5.5* 4.9 4.7  --  4.0  CL 102 106  --  106 106  --  106  CO2 25 22  --  20* 19*  --  20*  GLUCOSE 190* 191*  --  223* 230*  --  172*  BUN 24* 33*  --  48* 62*  --  79*  CREATININE 1.00 1.10*  --  1.41* 2.30*  --  2.65*  CALCIUM  9.3 8.4*  --  8.1* 8.0*  --  7.8*  MG  --   --   --   --   --  1.7  --   PHOS  --   --   --   --   --  4.2  --    GFR: Estimated Creatinine Clearance: 22.6 mL/min (A) (by C-G formula based on SCr of 2.65 mg/dL (H)). Recent Labs  Lab 10/11/24 0905 10/11/24 1505 10/11/24 2209 10/12/24 0557  WBC 21.3* 16.5* 16.9* 19.4*    Liver Function Tests: Recent Labs  Lab 10/09/24 2348 10/11/24 0905  AST 40 64*  ALT 20 31  ALKPHOS 124 94  BILITOT 1.6* 0.6  PROT 6.5 5.6*  ALBUMIN 3.0* 2.7*   Recent Labs  Lab 10/09/24 2348  LIPASE 84*   Recent Labs  Lab 10/10/24 0754 10/12/24 0557  AMMONIA 113* 42*    ABG    Component Value Date/Time   PHART 7.324 (L) 10/10/2024 1228   PCO2ART 43.7 10/10/2024 1228   PO2ART 143 (H) 10/10/2024 1228   HCO3 22.7 10/10/2024 1228   TCO2 24 10/10/2024 1228   ACIDBASEDEF 3.0 (H) 10/10/2024 1228   O2SAT 99 10/10/2024 1228     Coagulation Profile: Recent Labs  Lab 10/10/24 0040 10/11/24 0905  INR 1.1 1.3*    Cardiac Enzymes: No results for input(s): CKTOTAL, CKMB,  CKMBINDEX, TROPONINI in the last 168 hours.  HbA1C: Hemoglobin A1C  Date/Time Value Ref Range Status  11/06/2016 12:00 AM 9.8  Final   Hgb A1c MFr Bld  Date/Time Value Ref Range Status  06/10/2024 09:20 AM 5.4 4.6 - 6.5 % Final    Comment:    Glycemic Control Guidelines for People with Diabetes:Non Diabetic:  <6%Goal of Therapy: <7%Additional Action Suggested:  >8%   05/07/2023 10:23 AM 11.0 (H)  4.6 - 6.5 % Final    Comment:    Glycemic Control Guidelines for People with Diabetes:Non Diabetic:  <6%Goal of Therapy: <7%Additional Action Suggested:  >8%     CBG: Recent Labs  Lab 10/11/24 1530 10/11/24 1916 10/11/24 2315 10/12/24 0313 10/12/24 0801  GLUCAP 193* 178* 190* 179* 174*    Review of Systems:   Not obtained  Past Medical History:  She,  has a past medical history of Allergy, Anemia, Cirrhosis (HCC), Constipation, Contact lens/glasses fitting, Diabetes mellitus without complication (HCC), Edema of both lower extremities, Food allergy, Gastric varices, GERD (gastroesophageal reflux disease), Heart murmur, Hyperlipidemia, Hypertension, Joint pain, Periumbilical hernia, Primary biliary cholangitis (HCC), Snores, and Vitamin D  deficiency.   Surgical History:   Past Surgical History:  Procedure Laterality Date   BREAST BIOPSY Left 10/02/2023   MM LT BREAST BX W LOC DEV 1ST LESION IMAGE BX SPEC STEREO GUIDE 10/02/2023 [facility]   BREAST BIOPSY Left 10/02/2023   MM LT BREAST BX W LOC DEV EA AD LESION IMG BX SPEC STEREO GUIDE 10/02/2023 [facility]   DILATION AND CURETTAGE OF UTERUS  11/1995, 10/1997   IR EMBO VENOUS NOT HEMORR HEMANG  INC GUIDE ROADMAPPING  10/10/2024   IR INTRAVASCULAR ULTRASOUND NON CORONARY  10/10/2024   IR TIPS  10/10/2024   IR US  GUIDE VASC ACCESS RIGHT  10/10/2024   IR US  GUIDE VASC ACCESS RIGHT  10/10/2024   LEEP     LIVER BIOPSY     RADIOLOGY WITH ANESTHESIA N/A 10/10/2024   Procedure: RADIOLOGY WITH ANESTHESIA;  Surgeon: Radiologist,  Medication, MD;  Location: [facility];  Service: Radiology;  Laterality: N/A;   TUBAL LIGATION  10/1997   UMBILICAL HERNIA REPAIR N/A 08/11/2013   Procedure: HERNIA REPAIR UMBILICAL ADULT;  Surgeon: Lynwood MALVA Pina, MD;  Location: [facility];  Service: General;  Laterality: N/A;     Social History:   reports that she has never smoked. She has never used smokeless tobacco. She reports current alcohol use. She reports that she does not use drugs.   Family History:  Her family history includes Breast cancer in her mother; Depression in her mother; Diabetes in her father and another family member; Heart disease in her maternal aunt and maternal uncle; Hypertension in her mother; Lung cancer in her maternal grandfather and maternal grandmother; Obesity in her mother; Other in her father; Renal Disease in her father; Schizophrenia in her mother; Uterine cancer in her mother. There is no history of Colon cancer, Esophageal cancer, Stomach cancer, or Rectal cancer.   Allergies Allergies[1]   Home Medications  Prior to Admission medications  Medication Sig Start Date End Date Taking? Authorizing Provider  carvedilol  (COREG ) 3.125 MG tablet Take 3.125 mg by mouth 2 (two) times daily.   Yes [provider]  hydrochlorothiazide  (HYDRODIURIL ) 25 MG tablet TAKE 1 TABLET BY MOUTH DAILY 01/07/24  Yes Dugal, Tabitha, FNP  lactulose  (CHRONULAC ) 10 GM/15ML solution Take 45 mLs (30 g total) by mouth 2 (two) times daily. 08/29/24  Yes Mdala-Gausi, Masiku Agatha, MD  spironolactone  (ALDACTONE ) 50 MG tablet Take 1 tablet (50 mg total) by mouth daily. 09/07/24  Yes Dugal, Ginger, FNP  ursodiol  (ACTIGALL ) 300 MG capsule TAKE 2 CAPSULES BY MOUTH TWICE  DAILY 10/22/23  Yes Armbruster, Elspeth SQUIBB, MD     I have spent 35 minutes evaluating patient, reviewing chart, and discussing plan of care with patient, family, pharmacist on round and primary medical team.  Paula Southerly, MD  [facility]             [1]  Allergies Allergen Reactions   Mushroom Extract Complex (Obsolete) Anaphylaxis    Throat swells   Grapefruit Concentrate Other (See Comments)    blisters   Mounjaro  [Tirzepatide ] Other (See Comments)    Hepatic encephalopathy   Sulfa Antibiotics Rash    Severe dehydration   Amlodipine Other (See Comments)    Heart rate fast   Crestor  [Rosuvastatin ]     Brain fog, fatigue, felt 'terrible'.    Shellfish Allergy Hives        Simvastatin      Brain fog, fatigue, felt 'terrible'.    Strawberry Extract     hives   Metformin  And Related Diarrhea    Weakness    "

## 2024-10-12 NOTE — Plan of Care (Signed)
" °  Problem: Nutritional: Goal: Maintenance of adequate nutrition will improve Outcome: Not Progressing   Problem: Skin Integrity: Goal: Risk for impaired skin integrity will decrease Outcome: Progressing   Problem: Clinical Measurements: Goal: Respiratory complications will improve Outcome: Progressing   "

## 2024-10-12 NOTE — Anesthesia Postprocedure Evaluation (Signed)
"   Anesthesia Post Note  Patient: Susan [name]  Procedure(s) Performed: RADIOLOGY WITH ANESTHESIA     Patient location during evaluation: ICU Anesthesia Type: General Level of consciousness: sedated and patient remains intubated per anesthesia plan Pain management: pain level controlled Vital Signs Assessment: post-procedure vital signs reviewed and stable Respiratory status: spontaneous breathing and patient remains intubated per anesthesia plan Cardiovascular status: stable Anesthetic complications: no   No notable events documented.  Last Vitals:  Vitals:   10/12/24 1701 10/12/24 2130  BP: (!) 142/68 (!) 150/68  Pulse: 90 78  Resp: 18 18  Temp: 36.8 C 36.7 C  SpO2: 99% 97%    Last Pain:  Vitals:   10/12/24 2130  TempSrc: Oral  PainSc:                  Susan [name]      "

## 2024-10-12 NOTE — Progress Notes (Signed)
 "   Referring Physician(s): Dr. Gordy Starch  Supervising Physician: Jennefer Rover  Patient Status:  [facility] - In-pt  Chief Complaint: PBC/AIH cirrhosis with UGIB due to gastric varices S/p TIPS, coil embolization of posterior and left gastric veins, and BRTO by Dr. Jennefer on 10/10/24.  Subjective: Lying in bed.  Family at bedside.  Alert, but ruminating on not wanting broth for dinner.  Knows who she is.  States she has generalized abdominal pain; expected in the setting of coil embolization.  Family states she is slightly improved-- was able to feed herself today.  Note ongoing renal dysfunction  Allergies: Mushroom extract complex (obsolete), Grapefruit concentrate, Mounjaro  [tirzepatide ], Sulfa antibiotics, Amlodipine, Crestor  [rosuvastatin ], Shellfish allergy, Simvastatin , Strawberry extract, and Metformin  and related  Medications: Prior to Admission medications  Medication Sig Start Date End Date Taking? Authorizing Provider  carvedilol  (COREG ) 3.125 MG tablet Take 3.125 mg by mouth 2 (two) times daily.   Yes [provider]  hydrochlorothiazide  (HYDRODIURIL ) 25 MG tablet TAKE 1 TABLET BY MOUTH DAILY 01/07/24  Yes Dugal, Tabitha, FNP  lactulose  (CHRONULAC ) 10 GM/15ML solution Take 45 mLs (30 g total) by mouth 2 (two) times daily. 08/29/24  Yes Mdala-Gausi, Masiku Agatha, MD  spironolactone  (ALDACTONE ) 50 MG tablet Take 1 tablet (50 mg total) by mouth daily. 09/07/24  Yes Dugal, Ginger, FNP  ursodiol  (ACTIGALL ) 300 MG capsule TAKE 2 CAPSULES BY MOUTH TWICE  DAILY 10/22/23  Yes Armbruster, Elspeth SQUIBB, MD     Vital Signs: BP 129/67 (BP Location: Right Arm)   Pulse 90   Temp 98.6 F (37 C) (Oral)   Resp 16   Ht 5' (1.524 m)   Wt 190 lb 7.6 oz (86.4 kg)   SpO2 98%   BMI 37.20 kg/m   Physical Exam NAD, alert, communicative Abdomen: soft, non-tender to palpation Skin: internal jugular and femoral procedures sites intact.  No dressings needed.   Imaging: IR  Tips Result Date: 10/11/2024 CLINICAL DATA:  59 year old female with history of primary biliary cirrhosis complicated by gastric varices and gastro renal shunt presenting with gastric variceal hemorrhage. EXAM: 1. Ultrasound-guided access of the right internal jugular vein 2. Ultrasound-guided access of the right common femoral vein 3. Intravascular ultrasound 4. Catheterization of the portal vein 5. Portal venous and central manometry 6. Portal venogram 7. Creation of a transhepatic portal vein to hepatic vein shunt 8. Coil embolization of the left gastric and 2 posterior gastric veins 9. Coil assisted retrograde transvenous obliteration of gastro renal shunt MEDICATIONS: As antibiotic prophylaxis, Rocephin  1 gm IV was ordered pre-procedure and administered intravenously within one hour of incision. ANESTHESIA/SEDATION: General - as administered by the Anesthesia department CONTRAST:  Fifty ML Omnipaque  300, intravenous FLUOROSCOPY TIME:  Four hundred eighty mGy reference air kerma COMPLICATIONS: None immediate. PROCEDURE: Informed written consent was obtained from the patient after a thorough discussion of the procedural risks, benefits and alternatives. All questions were addressed. Maximal Sterile Barrier Technique was utilized including caps, mask, sterile gowns, sterile gloves, sterile drape, hand hygiene and skin antiseptic. A timeout was performed prior to the initiation of the procedure. A preliminary ultrasound of the right groin was performed and demonstrates a patent right common femoral vein. A permanent ultrasound image was recorded. Using a combination of fluoroscopy and ultrasound, an access site was determined. A small dermatotomy was made at the planned puncture site. Using ultrasound guidance, access into the right common femoral vein was obtained with visualization of needle entry into the vessel using a standard  micropuncture technique. A wire was advanced into the IVC insert all fascial  dilation performed. An 8 French, 11 cm vascular sheath was placed into the external iliac vein. Through this access site, an 62 French Accunav ICE catheter was advanced with ease under fluoroscopic guidance to the level of the intrahepatic inferior vena cava. A preliminary ultrasound of the right neck was performed and demonstrates a patent internal jugular vein. A permanent ultrasound image was recorded. Using a combination of fluoroscopy and ultrasound, an access site was determined. A small dermatotomy was made at the planned puncture site. Using ultrasound guidance, access into the right internal jugular vein was obtained with visualization of needle entry into the vessel using a standard micropuncture technique. A wire was advanced into the IVC and serial fascial dilation performed. A 10 French tips sheath was placed into the internal jugular vein and advanced to the IVC. The jugular sheath was retracted into the right atrium and manometry was performed. A 5 French angled tip catheter was then directed into the middle hepatic vein. The catheter was advanced to a wedge portion of the a patent vein over which the 10 French sheath was advanced into the middle hepatic vein. Using ICE ultrasound visualization the catheter within the middle hepatic vein as well as the portal anatomy was defined. A planned exit site from the hepatic vein and puncture site from the portal vein was placed into a single sonographic plane. Under direct ultrasound visualization, the ScorpionX needle was advanced into the central right portal vein. Hand injection of contrast confirmed position within the portal system. A Glidewire Advantage was then advanced into the splenic vein. A 5 French marking pigtail catheter was then advanced over the wire into the main portal vein and wire removed. Portal venogram was performed which demonstrated a patent portal vein with hepatofugal flow. Multiple large varices were seen arising from the main  portal vein. Portal manometry was then performed. The tract was then dilated to 6 mm with an 6 mm x 8 cm Athletis balloon. A 6-10 mm by 7 + 2 cm of Viatorr endograft was placed. This was ultimately dilated to 6 mm. Repeat portal venogram demonstrated persistent hepatopetal flow with preferential opacification of the large gastric varices. A 7 French, 55 cm Tourguide sheath was directed to the central aspect of the splenic vein. Repeat venogram demonstrated a prominent left gastric and too prominent posterior gastric veins with hepatofugal flow into large gastric varices. The left gastric vein was then selected. Dedicated angiogram demonstrated hepatofugal flow through multiple large varices draining via the gastro renal shunt. Coil embolization was then performed about the central aspect of the left gastric vein with an assortment of detachable 0.035  Azur coils. The catheter was then redirected into 1 of the posterior gastric veins in venogram demonstrated hepatopetal flow draining via the large gastro renal shunt. Coil embolization was then performed about the central aspect of the left gastric vein with an assortment of detachable 0.035 Azur coils. Next, the catheter was then redirected into an additional posterior gastric vein and venogram demonstrated hepatofugal flow draining via the large gastro renal shunt. Coil embolization was then performed about the central aspect of the left gastric vein with an assortment of detachable 0.035  Azur coils. Catheter was retracted into the splenic vein and venogram was performed which demonstrated persistent albeit significantly slowed hepatopetal flow through the gastric varices and a patent appearing portal vein. The coaxial catheter in sheath system was then removed from the portal  vein directed to the inferior vena cava, left renal vein, and into the gastro renal shunt. A 5 French Fogarty balloon was then inserted and inflated in the central aspect of the gastro  renal shunt with venogram demonstrating persistent patency of the gastric varices. The balloon was deflated in the contrast cleared via the left renal vein. The balloon was then reinflated and transvenous obliteration was performed with a 3-2-1 ratio of air, 3% Sotradecol , and lipiodol . Sclerosis at was administered under fluoroscopic visualization. With the balloon catheter in place, coil embolization was performed of the gastro renal shunt with an assortment of detachable 0.035 Azur coils. The balloon was then deflated slowly. Completion venogram demonstrated patency of the left renal vein. The catheters and sheaths were removed and manual compression was applied to the right internal jugular and right common femoral venous access sites until hemostasis was achieved. The patient was transferred to the ICU in stable condition. Pre-TIPS Mean Pressures (mmHg): Right atrium: 13 Portal vein: 26 Portosystemic gradient: 13 Post-TIPS Mean Pressures (mmHg): Not obtained due to erroneous measurements from extensive gastric variceal embolization. IMPRESSION: 1. Successful transjugular portosystemic shunt creation. 2. Technically successful coil embolization of a the left gastric vein and 2 posterior gastric veins to exclude large gastric varices. 3. Technically successful coil assisted retrograde transvenous obliteration of large gastrorenal shunt. Ester Sides, MD [facility] Radiology Electronically Signed   By: Ester Sides M.D.   On: 10/11/2024 14:54   IR EMBO VENOUS NOT HEMORR HEMANG  INC GUIDE ROADMAPPING Result Date: 10/11/2024 CLINICAL DATA:  59 year old female with history of primary biliary cirrhosis complicated by gastric varices and gastro renal shunt presenting with gastric variceal hemorrhage. EXAM: 1. Ultrasound-guided access of the right internal jugular vein 2. Ultrasound-guided access of the right common femoral vein 3. Intravascular ultrasound 4.  Catheterization of the portal vein 5. Portal venous and central manometry 6. Portal venogram 7. Creation of a transhepatic portal vein to hepatic vein shunt 8. Coil embolization of the left gastric and 2 posterior gastric veins 9. Coil assisted retrograde transvenous obliteration of gastro renal shunt MEDICATIONS: As antibiotic prophylaxis, Rocephin  1 gm IV was ordered pre-procedure and administered intravenously within one hour of incision. ANESTHESIA/SEDATION: General - as administered by the Anesthesia department CONTRAST:  Fifty ML Omnipaque  300, intravenous FLUOROSCOPY TIME:  Four hundred eighty mGy reference air kerma COMPLICATIONS: None immediate. PROCEDURE: Informed written consent was obtained from the patient after a thorough discussion of the procedural risks, benefits and alternatives. All questions were addressed. Maximal Sterile Barrier Technique was utilized including caps, mask, sterile gowns, sterile gloves, sterile drape, hand hygiene and skin antiseptic. A timeout was performed prior to the initiation of the procedure. A preliminary ultrasound of the right groin was performed and demonstrates a patent right common femoral vein. A permanent ultrasound image was recorded. Using a combination of fluoroscopy and ultrasound, an access site was determined. A small dermatotomy was made at the planned puncture site. Using ultrasound guidance, access into the right common femoral vein was obtained with visualization of needle entry into the vessel using a standard micropuncture technique. A wire was advanced into the IVC insert all fascial dilation performed. An 8 French, 11 cm vascular sheath was placed into the external iliac vein. Through this access site, an 59 French Accunav ICE catheter was advanced with ease under fluoroscopic guidance to the level of the intrahepatic inferior vena cava. A preliminary ultrasound of the right neck was performed and demonstrates a patent  internal jugular vein. A  permanent ultrasound image was recorded. Using a combination of fluoroscopy and ultrasound, an access site was determined. A small dermatotomy was made at the planned puncture site. Using ultrasound guidance, access into the right internal jugular vein was obtained with visualization of needle entry into the vessel using a standard micropuncture technique. A wire was advanced into the IVC and serial fascial dilation performed. A 10 French tips sheath was placed into the internal jugular vein and advanced to the IVC. The jugular sheath was retracted into the right atrium and manometry was performed. A 5 French angled tip catheter was then directed into the middle hepatic vein. The catheter was advanced to a wedge portion of the a patent vein over which the 10 French sheath was advanced into the middle hepatic vein. Using ICE ultrasound visualization the catheter within the middle hepatic vein as well as the portal anatomy was defined. A planned exit site from the hepatic vein and puncture site from the portal vein was placed into a single sonographic plane. Under direct ultrasound visualization, the ScorpionX needle was advanced into the central right portal vein. Hand injection of contrast confirmed position within the portal system. A Glidewire Advantage was then advanced into the splenic vein. A 5 French marking pigtail catheter was then advanced over the wire into the main portal vein and wire removed. Portal venogram was performed which demonstrated a patent portal vein with hepatofugal flow. Multiple large varices were seen arising from the main portal vein. Portal manometry was then performed. The tract was then dilated to 6 mm with an 6 mm x 8 cm Athletis balloon. A 6-10 mm by 7 + 2 cm of Viatorr endograft was placed. This was ultimately dilated to 6 mm. Repeat portal venogram demonstrated persistent hepatopetal flow with preferential opacification of the large gastric varices. A 7 French, 55 cm Tourguide  sheath was directed to the central aspect of the splenic vein. Repeat venogram demonstrated a prominent left gastric and too prominent posterior gastric veins with hepatofugal flow into large gastric varices. The left gastric vein was then selected. Dedicated angiogram demonstrated hepatofugal flow through multiple large varices draining via the gastro renal shunt. Coil embolization was then performed about the central aspect of the left gastric vein with an assortment of detachable 0.035  Azur coils. The catheter was then redirected into 1 of the posterior gastric veins in venogram demonstrated hepatopetal flow draining via the large gastro renal shunt. Coil embolization was then performed about the central aspect of the left gastric vein with an assortment of detachable 0.035 Azur coils. Next, the catheter was then redirected into an additional posterior gastric vein and venogram demonstrated hepatofugal flow draining via the large gastro renal shunt. Coil embolization was then performed about the central aspect of the left gastric vein with an assortment of detachable 0.035  Azur coils. Catheter was retracted into the splenic vein and venogram was performed which demonstrated persistent albeit significantly slowed hepatopetal flow through the gastric varices and a patent appearing portal vein. The coaxial catheter in sheath system was then removed from the portal vein directed to the inferior vena cava, left renal vein, and into the gastro renal shunt. A 5 French Fogarty balloon was then inserted and inflated in the central aspect of the gastro renal shunt with venogram demonstrating persistent patency of the gastric varices. The balloon was deflated in the contrast cleared via the left renal vein. The balloon was then reinflated and transvenous obliteration was performed  with a 3-2-1 ratio of air, 3% Sotradecol , and lipiodol . Sclerosis at was administered under fluoroscopic visualization. With the balloon  catheter in place, coil embolization was performed of the gastro renal shunt with an assortment of detachable 0.035 Azur coils. The balloon was then deflated slowly. Completion venogram demonstrated patency of the left renal vein. The catheters and sheaths were removed and manual compression was applied to the right internal jugular and right common femoral venous access sites until hemostasis was achieved. The patient was transferred to the ICU in stable condition. Pre-TIPS Mean Pressures (mmHg): Right atrium: 13 Portal vein: 26 Portosystemic gradient: 13 Post-TIPS Mean Pressures (mmHg): Not obtained due to erroneous measurements from extensive gastric variceal embolization. IMPRESSION: 1. Successful transjugular portosystemic shunt creation. 2. Technically successful coil embolization of a the left gastric vein and 2 posterior gastric veins to exclude large gastric varices. 3. Technically successful coil assisted retrograde transvenous obliteration of large gastrorenal shunt. Ester Sides, MD [facility] Radiology Electronically Signed   By: Ester Sides M.D.   On: 10/11/2024 14:54   IR US  Guide Vasc Access Right Result Date: 10/11/2024 CLINICAL DATA:  59 year old female with history of primary biliary cirrhosis complicated by gastric varices and gastro renal shunt presenting with gastric variceal hemorrhage. EXAM: 1. Ultrasound-guided access of the right internal jugular vein 2. Ultrasound-guided access of the right common femoral vein 3. Intravascular ultrasound 4. Catheterization of the portal vein 5. Portal venous and central manometry 6. Portal venogram 7. Creation of a transhepatic portal vein to hepatic vein shunt 8. Coil embolization of the left gastric and 2 posterior gastric veins 9. Coil assisted retrograde transvenous obliteration of gastro renal shunt MEDICATIONS: As antibiotic prophylaxis, Rocephin  1 gm IV was ordered pre-procedure and  administered intravenously within one hour of incision. ANESTHESIA/SEDATION: General - as administered by the Anesthesia department CONTRAST:  Fifty ML Omnipaque  300, intravenous FLUOROSCOPY TIME:  Four hundred eighty mGy reference air kerma COMPLICATIONS: None immediate. PROCEDURE: Informed written consent was obtained from the patient after a thorough discussion of the procedural risks, benefits and alternatives. All questions were addressed. Maximal Sterile Barrier Technique was utilized including caps, mask, sterile gowns, sterile gloves, sterile drape, hand hygiene and skin antiseptic. A timeout was performed prior to the initiation of the procedure. A preliminary ultrasound of the right groin was performed and demonstrates a patent right common femoral vein. A permanent ultrasound image was recorded. Using a combination of fluoroscopy and ultrasound, an access site was determined. A small dermatotomy was made at the planned puncture site. Using ultrasound guidance, access into the right common femoral vein was obtained with visualization of needle entry into the vessel using a standard micropuncture technique. A wire was advanced into the IVC insert all fascial dilation performed. An 8 French, 11 cm vascular sheath was placed into the external iliac vein. Through this access site, an 35 French Accunav ICE catheter was advanced with ease under fluoroscopic guidance to the level of the intrahepatic inferior vena cava. A preliminary ultrasound of the right neck was performed and demonstrates a patent internal jugular vein. A permanent ultrasound image was recorded. Using a combination of fluoroscopy and ultrasound, an access site was determined. A small dermatotomy was made at the planned puncture site. Using ultrasound guidance, access into the right internal jugular vein was obtained with visualization of needle entry into the vessel using a standard micropuncture technique. A wire was advanced into the IVC and  serial fascial dilation performed. A 10  French tips sheath was placed into the internal jugular vein and advanced to the IVC. The jugular sheath was retracted into the right atrium and manometry was performed. A 5 French angled tip catheter was then directed into the middle hepatic vein. The catheter was advanced to a wedge portion of the a patent vein over which the 10 French sheath was advanced into the middle hepatic vein. Using ICE ultrasound visualization the catheter within the middle hepatic vein as well as the portal anatomy was defined. A planned exit site from the hepatic vein and puncture site from the portal vein was placed into a single sonographic plane. Under direct ultrasound visualization, the ScorpionX needle was advanced into the central right portal vein. Hand injection of contrast confirmed position within the portal system. A Glidewire Advantage was then advanced into the splenic vein. A 5 French marking pigtail catheter was then advanced over the wire into the main portal vein and wire removed. Portal venogram was performed which demonstrated a patent portal vein with hepatofugal flow. Multiple large varices were seen arising from the main portal vein. Portal manometry was then performed. The tract was then dilated to 6 mm with an 6 mm x 8 cm Athletis balloon. A 6-10 mm by 7 + 2 cm of Viatorr endograft was placed. This was ultimately dilated to 6 mm. Repeat portal venogram demonstrated persistent hepatopetal flow with preferential opacification of the large gastric varices. A 7 French, 55 cm Tourguide sheath was directed to the central aspect of the splenic vein. Repeat venogram demonstrated a prominent left gastric and too prominent posterior gastric veins with hepatofugal flow into large gastric varices. The left gastric vein was then selected. Dedicated angiogram demonstrated hepatofugal flow through multiple large varices draining via the gastro renal shunt. Coil embolization was then  performed about the central aspect of the left gastric vein with an assortment of detachable 0.035  Azur coils. The catheter was then redirected into 1 of the posterior gastric veins in venogram demonstrated hepatopetal flow draining via the large gastro renal shunt. Coil embolization was then performed about the central aspect of the left gastric vein with an assortment of detachable 0.035 Azur coils. Next, the catheter was then redirected into an additional posterior gastric vein and venogram demonstrated hepatofugal flow draining via the large gastro renal shunt. Coil embolization was then performed about the central aspect of the left gastric vein with an assortment of detachable 0.035  Azur coils. Catheter was retracted into the splenic vein and venogram was performed which demonstrated persistent albeit significantly slowed hepatopetal flow through the gastric varices and a patent appearing portal vein. The coaxial catheter in sheath system was then removed from the portal vein directed to the inferior vena cava, left renal vein, and into the gastro renal shunt. A 5 French Fogarty balloon was then inserted and inflated in the central aspect of the gastro renal shunt with venogram demonstrating persistent patency of the gastric varices. The balloon was deflated in the contrast cleared via the left renal vein. The balloon was then reinflated and transvenous obliteration was performed with a 3-2-1 ratio of air, 3% Sotradecol , and lipiodol . Sclerosis at was administered under fluoroscopic visualization. With the balloon catheter in place, coil embolization was performed of the gastro renal shunt with an assortment of detachable 0.035 Azur coils. The balloon was then deflated slowly. Completion venogram demonstrated patency of the left renal vein. The catheters and sheaths were removed and manual compression was applied to the right internal jugular  and right common femoral venous access sites until hemostasis  was achieved. The patient was transferred to the ICU in stable condition. Pre-TIPS Mean Pressures (mmHg): Right atrium: 13 Portal vein: 26 Portosystemic gradient: 13 Post-TIPS Mean Pressures (mmHg): Not obtained due to erroneous measurements from extensive gastric variceal embolization. IMPRESSION: 1. Successful transjugular portosystemic shunt creation. 2. Technically successful coil embolization of a the left gastric vein and 2 posterior gastric veins to exclude large gastric varices. 3. Technically successful coil assisted retrograde transvenous obliteration of large gastrorenal shunt. Ester Sides, MD [facility] Radiology Electronically Signed   By: Ester Sides M.D.   On: 10/11/2024 14:54   IR US  Guide Vasc Access Right Result Date: 10/11/2024 CLINICAL DATA:  59 year old female with history of primary biliary cirrhosis complicated by gastric varices and gastro renal shunt presenting with gastric variceal hemorrhage. EXAM: 1. Ultrasound-guided access of the right internal jugular vein 2. Ultrasound-guided access of the right common femoral vein 3. Intravascular ultrasound 4. Catheterization of the portal vein 5. Portal venous and central manometry 6. Portal venogram 7. Creation of a transhepatic portal vein to hepatic vein shunt 8. Coil embolization of the left gastric and 2 posterior gastric veins 9. Coil assisted retrograde transvenous obliteration of gastro renal shunt MEDICATIONS: As antibiotic prophylaxis, Rocephin  1 gm IV was ordered pre-procedure and administered intravenously within one hour of incision. ANESTHESIA/SEDATION: General - as administered by the Anesthesia department CONTRAST:  Fifty ML Omnipaque  300, intravenous FLUOROSCOPY TIME:  Four hundred eighty mGy reference air kerma COMPLICATIONS: None immediate. PROCEDURE: Informed written consent was obtained from the patient after a thorough discussion of the procedural risks, benefits and  alternatives. All questions were addressed. Maximal Sterile Barrier Technique was utilized including caps, mask, sterile gowns, sterile gloves, sterile drape, hand hygiene and skin antiseptic. A timeout was performed prior to the initiation of the procedure. A preliminary ultrasound of the right groin was performed and demonstrates a patent right common femoral vein. A permanent ultrasound image was recorded. Using a combination of fluoroscopy and ultrasound, an access site was determined. A small dermatotomy was made at the planned puncture site. Using ultrasound guidance, access into the right common femoral vein was obtained with visualization of needle entry into the vessel using a standard micropuncture technique. A wire was advanced into the IVC insert all fascial dilation performed. An 8 French, 11 cm vascular sheath was placed into the external iliac vein. Through this access site, an 94 French Accunav ICE catheter was advanced with ease under fluoroscopic guidance to the level of the intrahepatic inferior vena cava. A preliminary ultrasound of the right neck was performed and demonstrates a patent internal jugular vein. A permanent ultrasound image was recorded. Using a combination of fluoroscopy and ultrasound, an access site was determined. A small dermatotomy was made at the planned puncture site. Using ultrasound guidance, access into the right internal jugular vein was obtained with visualization of needle entry into the vessel using a standard micropuncture technique. A wire was advanced into the IVC and serial fascial dilation performed. A 10 French tips sheath was placed into the internal jugular vein and advanced to the IVC. The jugular sheath was retracted into the right atrium and manometry was performed. A 5 French angled tip catheter was then directed into the middle hepatic vein. The catheter was advanced to a wedge portion of the a patent vein over which the 10 French sheath was advanced into  the middle hepatic vein. Using ICE ultrasound  visualization the catheter within the middle hepatic vein as well as the portal anatomy was defined. A planned exit site from the hepatic vein and puncture site from the portal vein was placed into a single sonographic plane. Under direct ultrasound visualization, the ScorpionX needle was advanced into the central right portal vein. Hand injection of contrast confirmed position within the portal system. A Glidewire Advantage was then advanced into the splenic vein. A 5 French marking pigtail catheter was then advanced over the wire into the main portal vein and wire removed. Portal venogram was performed which demonstrated a patent portal vein with hepatofugal flow. Multiple large varices were seen arising from the main portal vein. Portal manometry was then performed. The tract was then dilated to 6 mm with an 6 mm x 8 cm Athletis balloon. A 6-10 mm by 7 + 2 cm of Viatorr endograft was placed. This was ultimately dilated to 6 mm. Repeat portal venogram demonstrated persistent hepatopetal flow with preferential opacification of the large gastric varices. A 7 French, 55 cm Tourguide sheath was directed to the central aspect of the splenic vein. Repeat venogram demonstrated a prominent left gastric and too prominent posterior gastric veins with hepatofugal flow into large gastric varices. The left gastric vein was then selected. Dedicated angiogram demonstrated hepatofugal flow through multiple large varices draining via the gastro renal shunt. Coil embolization was then performed about the central aspect of the left gastric vein with an assortment of detachable 0.035  Azur coils. The catheter was then redirected into 1 of the posterior gastric veins in venogram demonstrated hepatopetal flow draining via the large gastro renal shunt. Coil embolization was then performed about the central aspect of the left gastric vein with an assortment of detachable 0.035 Azur coils.  Next, the catheter was then redirected into an additional posterior gastric vein and venogram demonstrated hepatofugal flow draining via the large gastro renal shunt. Coil embolization was then performed about the central aspect of the left gastric vein with an assortment of detachable 0.035  Azur coils. Catheter was retracted into the splenic vein and venogram was performed which demonstrated persistent albeit significantly slowed hepatopetal flow through the gastric varices and a patent appearing portal vein. The coaxial catheter in sheath system was then removed from the portal vein directed to the inferior vena cava, left renal vein, and into the gastro renal shunt. A 5 French Fogarty balloon was then inserted and inflated in the central aspect of the gastro renal shunt with venogram demonstrating persistent patency of the gastric varices. The balloon was deflated in the contrast cleared via the left renal vein. The balloon was then reinflated and transvenous obliteration was performed with a 3-2-1 ratio of air, 3% Sotradecol , and lipiodol . Sclerosis at was administered under fluoroscopic visualization. With the balloon catheter in place, coil embolization was performed of the gastro renal shunt with an assortment of detachable 0.035 Azur coils. The balloon was then deflated slowly. Completion venogram demonstrated patency of the left renal vein. The catheters and sheaths were removed and manual compression was applied to the right internal jugular and right common femoral venous access sites until hemostasis was achieved. The patient was transferred to the ICU in stable condition. Pre-TIPS Mean Pressures (mmHg): Right atrium: 13 Portal vein: 26 Portosystemic gradient: 13 Post-TIPS Mean Pressures (mmHg): Not obtained due to erroneous measurements from extensive gastric variceal embolization. IMPRESSION: 1. Successful transjugular portosystemic shunt creation. 2. Technically successful coil embolization of a  the left gastric vein and 2 posterior  gastric veins to exclude large gastric varices. 3. Technically successful coil assisted retrograde transvenous obliteration of large gastrorenal shunt. Ester Sides, MD [facility] Radiology Electronically Signed   By: Ester Sides M.D.   On: 10/11/2024 14:54   IR VEIN SCLEROSING SINGLE INCOMPOTENT Result Date: 10/11/2024 CLINICAL DATA:  59 year old female with history of primary biliary cirrhosis complicated by gastric varices and gastro renal shunt presenting with gastric variceal hemorrhage. EXAM: 1. Ultrasound-guided access of the right internal jugular vein 2. Ultrasound-guided access of the right common femoral vein 3. Intravascular ultrasound 4. Catheterization of the portal vein 5. Portal venous and central manometry 6. Portal venogram 7. Creation of a transhepatic portal vein to hepatic vein shunt 8. Coil embolization of the left gastric and 2 posterior gastric veins 9. Coil assisted retrograde transvenous obliteration of gastro renal shunt MEDICATIONS: As antibiotic prophylaxis, Rocephin  1 gm IV was ordered pre-procedure and administered intravenously within one hour of incision. ANESTHESIA/SEDATION: General - as administered by the Anesthesia department CONTRAST:  Fifty ML Omnipaque  300, intravenous FLUOROSCOPY TIME:  Four hundred eighty mGy reference air kerma COMPLICATIONS: None immediate. PROCEDURE: Informed written consent was obtained from the patient after a thorough discussion of the procedural risks, benefits and alternatives. All questions were addressed. Maximal Sterile Barrier Technique was utilized including caps, mask, sterile gowns, sterile gloves, sterile drape, hand hygiene and skin antiseptic. A timeout was performed prior to the initiation of the procedure. A preliminary ultrasound of the right groin was performed and demonstrates a patent right common femoral vein. A permanent ultrasound image was  recorded. Using a combination of fluoroscopy and ultrasound, an access site was determined. A small dermatotomy was made at the planned puncture site. Using ultrasound guidance, access into the right common femoral vein was obtained with visualization of needle entry into the vessel using a standard micropuncture technique. A wire was advanced into the IVC insert all fascial dilation performed. An 8 French, 11 cm vascular sheath was placed into the external iliac vein. Through this access site, an 38 French Accunav ICE catheter was advanced with ease under fluoroscopic guidance to the level of the intrahepatic inferior vena cava. A preliminary ultrasound of the right neck was performed and demonstrates a patent internal jugular vein. A permanent ultrasound image was recorded. Using a combination of fluoroscopy and ultrasound, an access site was determined. A small dermatotomy was made at the planned puncture site. Using ultrasound guidance, access into the right internal jugular vein was obtained with visualization of needle entry into the vessel using a standard micropuncture technique. A wire was advanced into the IVC and serial fascial dilation performed. A 10 French tips sheath was placed into the internal jugular vein and advanced to the IVC. The jugular sheath was retracted into the right atrium and manometry was performed. A 5 French angled tip catheter was then directed into the middle hepatic vein. The catheter was advanced to a wedge portion of the a patent vein over which the 10 French sheath was advanced into the middle hepatic vein. Using ICE ultrasound visualization the catheter within the middle hepatic vein as well as the portal anatomy was defined. A planned exit site from the hepatic vein and puncture site from the portal vein was placed into a single sonographic plane. Under direct ultrasound visualization, the ScorpionX needle was advanced into the central right portal vein. Hand injection of  contrast confirmed position within the portal system. A Glidewire Advantage was then advanced into the splenic  vein. A 5 French marking pigtail catheter was then advanced over the wire into the main portal vein and wire removed. Portal venogram was performed which demonstrated a patent portal vein with hepatofugal flow. Multiple large varices were seen arising from the main portal vein. Portal manometry was then performed. The tract was then dilated to 6 mm with an 6 mm x 8 cm Athletis balloon. A 6-10 mm by 7 + 2 cm of Viatorr endograft was placed. This was ultimately dilated to 6 mm. Repeat portal venogram demonstrated persistent hepatopetal flow with preferential opacification of the large gastric varices. A 7 French, 55 cm Tourguide sheath was directed to the central aspect of the splenic vein. Repeat venogram demonstrated a prominent left gastric and too prominent posterior gastric veins with hepatofugal flow into large gastric varices. The left gastric vein was then selected. Dedicated angiogram demonstrated hepatofugal flow through multiple large varices draining via the gastro renal shunt. Coil embolization was then performed about the central aspect of the left gastric vein with an assortment of detachable 0.035  Azur coils. The catheter was then redirected into 1 of the posterior gastric veins in venogram demonstrated hepatopetal flow draining via the large gastro renal shunt. Coil embolization was then performed about the central aspect of the left gastric vein with an assortment of detachable 0.035 Azur coils. Next, the catheter was then redirected into an additional posterior gastric vein and venogram demonstrated hepatofugal flow draining via the large gastro renal shunt. Coil embolization was then performed about the central aspect of the left gastric vein with an assortment of detachable 0.035  Azur coils. Catheter was retracted into the splenic vein and venogram was performed which demonstrated  persistent albeit significantly slowed hepatopetal flow through the gastric varices and a patent appearing portal vein. The coaxial catheter in sheath system was then removed from the portal vein directed to the inferior vena cava, left renal vein, and into the gastro renal shunt. A 5 French Fogarty balloon was then inserted and inflated in the central aspect of the gastro renal shunt with venogram demonstrating persistent patency of the gastric varices. The balloon was deflated in the contrast cleared via the left renal vein. The balloon was then reinflated and transvenous obliteration was performed with a 3-2-1 ratio of air, 3% Sotradecol , and lipiodol . Sclerosis at was administered under fluoroscopic visualization. With the balloon catheter in place, coil embolization was performed of the gastro renal shunt with an assortment of detachable 0.035 Azur coils. The balloon was then deflated slowly. Completion venogram demonstrated patency of the left renal vein. The catheters and sheaths were removed and manual compression was applied to the right internal jugular and right common femoral venous access sites until hemostasis was achieved. The patient was transferred to the ICU in stable condition. Pre-TIPS Mean Pressures (mmHg): Right atrium: 13 Portal vein: 26 Portosystemic gradient: 13 Post-TIPS Mean Pressures (mmHg): Not obtained due to erroneous measurements from extensive gastric variceal embolization. IMPRESSION: 1. Successful transjugular portosystemic shunt creation. 2. Technically successful coil embolization of a the left gastric vein and 2 posterior gastric veins to exclude large gastric varices. 3. Technically successful coil assisted retrograde transvenous obliteration of large gastrorenal shunt. Ester Sides, MD [facility] Radiology Electronically Signed   By: Ester Sides M.D.   On: 10/11/2024 14:54   IR INTRAVASCULAR ULTRASOUND NON CORONARY Result  Date: 10/11/2024 CLINICAL DATA:  59 year old female with history of primary biliary cirrhosis complicated by gastric varices and gastro renal shunt  presenting with gastric variceal hemorrhage. EXAM: 1. Ultrasound-guided access of the right internal jugular vein 2. Ultrasound-guided access of the right common femoral vein 3. Intravascular ultrasound 4. Catheterization of the portal vein 5. Portal venous and central manometry 6. Portal venogram 7. Creation of a transhepatic portal vein to hepatic vein shunt 8. Coil embolization of the left gastric and 2 posterior gastric veins 9. Coil assisted retrograde transvenous obliteration of gastro renal shunt MEDICATIONS: As antibiotic prophylaxis, Rocephin  1 gm IV was ordered pre-procedure and administered intravenously within one hour of incision. ANESTHESIA/SEDATION: General - as administered by the Anesthesia department CONTRAST:  Fifty ML Omnipaque  300, intravenous FLUOROSCOPY TIME:  Four hundred eighty mGy reference air kerma COMPLICATIONS: None immediate. PROCEDURE: Informed written consent was obtained from the patient after a thorough discussion of the procedural risks, benefits and alternatives. All questions were addressed. Maximal Sterile Barrier Technique was utilized including caps, mask, sterile gowns, sterile gloves, sterile drape, hand hygiene and skin antiseptic. A timeout was performed prior to the initiation of the procedure. A preliminary ultrasound of the right groin was performed and demonstrates a patent right common femoral vein. A permanent ultrasound image was recorded. Using a combination of fluoroscopy and ultrasound, an access site was determined. A small dermatotomy was made at the planned puncture site. Using ultrasound guidance, access into the right common femoral vein was obtained with visualization of needle entry into the vessel using a standard micropuncture technique. A wire was advanced into the IVC insert all fascial dilation performed.  An 8 French, 11 cm vascular sheath was placed into the external iliac vein. Through this access site, an 52 French Accunav ICE catheter was advanced with ease under fluoroscopic guidance to the level of the intrahepatic inferior vena cava. A preliminary ultrasound of the right neck was performed and demonstrates a patent internal jugular vein. A permanent ultrasound image was recorded. Using a combination of fluoroscopy and ultrasound, an access site was determined. A small dermatotomy was made at the planned puncture site. Using ultrasound guidance, access into the right internal jugular vein was obtained with visualization of needle entry into the vessel using a standard micropuncture technique. A wire was advanced into the IVC and serial fascial dilation performed. A 10 French tips sheath was placed into the internal jugular vein and advanced to the IVC. The jugular sheath was retracted into the right atrium and manometry was performed. A 5 French angled tip catheter was then directed into the middle hepatic vein. The catheter was advanced to a wedge portion of the a patent vein over which the 10 French sheath was advanced into the middle hepatic vein. Using ICE ultrasound visualization the catheter within the middle hepatic vein as well as the portal anatomy was defined. A planned exit site from the hepatic vein and puncture site from the portal vein was placed into a single sonographic plane. Under direct ultrasound visualization, the ScorpionX needle was advanced into the central right portal vein. Hand injection of contrast confirmed position within the portal system. A Glidewire Advantage was then advanced into the splenic vein. A 5 French marking pigtail catheter was then advanced over the wire into the main portal vein and wire removed. Portal venogram was performed which demonstrated a patent portal vein with hepatofugal flow. Multiple large varices were seen arising from the main portal vein. Portal  manometry was then performed. The tract was then dilated to 6 mm with an 6 mm x 8 cm Athletis balloon. A 6-10 mm by 7 +  2 cm of Viatorr endograft was placed. This was ultimately dilated to 6 mm. Repeat portal venogram demonstrated persistent hepatopetal flow with preferential opacification of the large gastric varices. A 7 French, 55 cm Tourguide sheath was directed to the central aspect of the splenic vein. Repeat venogram demonstrated a prominent left gastric and too prominent posterior gastric veins with hepatofugal flow into large gastric varices. The left gastric vein was then selected. Dedicated angiogram demonstrated hepatofugal flow through multiple large varices draining via the gastro renal shunt. Coil embolization was then performed about the central aspect of the left gastric vein with an assortment of detachable 0.035  Azur coils. The catheter was then redirected into 1 of the posterior gastric veins in venogram demonstrated hepatopetal flow draining via the large gastro renal shunt. Coil embolization was then performed about the central aspect of the left gastric vein with an assortment of detachable 0.035 Azur coils. Next, the catheter was then redirected into an additional posterior gastric vein and venogram demonstrated hepatofugal flow draining via the large gastro renal shunt. Coil embolization was then performed about the central aspect of the left gastric vein with an assortment of detachable 0.035  Azur coils. Catheter was retracted into the splenic vein and venogram was performed which demonstrated persistent albeit significantly slowed hepatopetal flow through the gastric varices and a patent appearing portal vein. The coaxial catheter in sheath system was then removed from the portal vein directed to the inferior vena cava, left renal vein, and into the gastro renal shunt. A 5 French Fogarty balloon was then inserted and inflated in the central aspect of the gastro renal shunt with  venogram demonstrating persistent patency of the gastric varices. The balloon was deflated in the contrast cleared via the left renal vein. The balloon was then reinflated and transvenous obliteration was performed with a 3-2-1 ratio of air, 3% Sotradecol , and lipiodol . Sclerosis at was administered under fluoroscopic visualization. With the balloon catheter in place, coil embolization was performed of the gastro renal shunt with an assortment of detachable 0.035 Azur coils. The balloon was then deflated slowly. Completion venogram demonstrated patency of the left renal vein. The catheters and sheaths were removed and manual compression was applied to the right internal jugular and right common femoral venous access sites until hemostasis was achieved. The patient was transferred to the ICU in stable condition. Pre-TIPS Mean Pressures (mmHg): Right atrium: 13 Portal vein: 26 Portosystemic gradient: 13 Post-TIPS Mean Pressures (mmHg): Not obtained due to erroneous measurements from extensive gastric variceal embolization. IMPRESSION: 1. Successful transjugular portosystemic shunt creation. 2. Technically successful coil embolization of a the left gastric vein and 2 posterior gastric veins to exclude large gastric varices. 3. Technically successful coil assisted retrograde transvenous obliteration of large gastrorenal shunt. Ester Sides, MD [facility] Radiology Electronically Signed   By: Ester Sides M.D.   On: 10/11/2024 14:54   DG CHEST PORT 1 VIEW Result Date: 10/11/2024 EXAM: 1 VIEW(S) XRAY OF THE CHEST 10/11/2024 07:46:00 AM COMPARISON: 08/28/2024 CLINICAL HISTORY: Hypoxia. FINDINGS: LINES, TUBES AND DEVICES: Endotracheal tube in place with tip 3.1 cm above the carina. LUNGS AND PLEURA: Mildly decreased lung volumes. Mild left basilar linear subsegmental atelectasis. No pleural effusion. No pneumothorax. HEART AND MEDIASTINUM: No acute abnormality of the  cardiac and mediastinal silhouettes. BONES AND SOFT TISSUES: No acute osseous abnormality. Left upper quadrant embolization coils noted. IMPRESSION: 1. Endotracheal tube tip 3.1 cm above the carina. 2. Mild left basilar linear subsegmental atelectasis.  3. Left upper quadrant embolization coils. Electronically signed by: Evalene Coho MD 10/11/2024 07:50 AM EST RP Workstation: [id number]   CT ABDOMEN PELVIS W CONTRAST Result Date: 10/10/2024 EXAM: CT ABDOMEN AND PELVIS WITH CONTRAST 10/10/2024 01:43:16 AM TECHNIQUE: CT of the abdomen and pelvis was performed with the administration of 75 mL of iohexol  (OMNIPAQUE ) 350 MG/ML injection. Multiplanar reformatted images are provided for review. Automated exposure control, iterative reconstruction, and/or weight-based adjustment of the mA/kV was utilized to reduce the radiation dose to as low as reasonably achievable. COMPARISON: None available. CLINICAL HISTORY: History of cirrhosis. Abdominal pain. Epigastric. Vomiting blood. Elevated lipase. Likely related to esophageal varices. FINDINGS: LOWER CHEST: No acute abnormality. LIVER: Nodular liver compatible with cirrhosis. GALLBLADDER AND BILE DUCTS: Small gallstone noted within the gallbladder. No biliary ductal dilatation. SPLEEN: Normal size. No focal abnormality. PANCREAS: No acute abnormality. ADRENAL GLANDS: No acute abnormality. KIDNEYS, URETERS AND BLADDER: No stones in the kidneys or ureters. No hydronephrosis. No perinephric or periureteral stranding. Urinary bladder is unremarkable. GI AND BOWEL: The stomach is moderately distended with fluid and debris. Normal appendix. Mild wall thickening in the right colon could reflect portal colopathy or colitis. There is no bowel obstruction. PERITONEUM AND RETROPERITONEUM: No ascites. No free air. VASCULATURE: Associated upper abdominal varices. Aorta is normal in caliber. Spontaneous left splenorenal shunt. LYMPH NODES: No lymphadenopathy. REPRODUCTIVE ORGANS: No  acute abnormality. BONES AND SOFT TISSUES: No acute osseous abnormality. No focal soft tissue abnormality. IMPRESSION: 1. Nodular liver compatible with cirrhosis, with associated upper abdominal varices and spontaneous left splenorenal shunt. 2. Moderately distended stomach with fluid and debris. 3. Mild wall thickening in the right colon, possibly reflecting portal colopathy or colitis. Electronically signed by: Franky Crease MD 10/10/2024 01:47 AM EST RP Workstation: [id number]   CT Head Wo Contrast Result Date: 10/10/2024 EXAM: CT HEAD WITHOUT CONTRAST 10/10/2024 01:43:16 AM TECHNIQUE: CT of the head was performed without the administration of intravenous contrast. Automated exposure control, iterative reconstruction, and/or weight based adjustment of the mA/kV was utilized to reduce the radiation dose to as low as reasonably achievable. COMPARISON: 08/28/2024 CLINICAL HISTORY: Altered mental status FINDINGS: BRAIN AND VENTRICLES: No acute hemorrhage. No evidence of acute infarct. No hydrocephalus. No extra-axial collection. No mass effect or midline shift. ORBITS: No acute abnormality. SINUSES: No acute abnormality. SOFT TISSUES AND SKULL: No acute soft tissue abnormality. No skull fracture. IMPRESSION: 1. No acute intracranial abnormality. Electronically signed by: Franky Stanford MD 10/10/2024 01:45 AM EST RP Workstation: [id number]    Labs:  CBC: Recent Labs    10/11/24 0905 10/11/24 1505 10/11/24 2209 10/12/24 0557  WBC 21.3* 16.5* 16.9* 19.4*  HGB 8.8* 8.4* 8.0* 8.2*  HCT 25.8* 24.3* 22.9* 23.9*  PLT 139* 103* 97* 114*    COAGS: Recent Labs    08/29/24 0553 09/03/24 1419 10/10/24 0040 10/11/24 0905  INR 1.3* 1.4* 1.1 1.3*  APTT 35  --  24  --     BMP: Recent Labs    10/10/24 0754 10/10/24 1228 10/10/24 1949 10/11/24 0905 10/12/24 0557  NA 137 138 136 137 136  K 4.9 5.5* 4.9 4.7 4.0  CL 106  --  106 106 106  CO2 22  --  20* 19* 20*  GLUCOSE 191*  --  223* 230* 172*   BUN 33*  --  48* 62* 79*  CALCIUM  8.4*  --  8.1* 8.0* 7.8*  CREATININE 1.10*  --  1.41* 2.30* 2.65*  GFRNONAA 58*  --  43* 24* 20*  LIVER FUNCTION TESTS: Recent Labs    08/29/24 0553 09/03/24 1419 10/09/24 2348 10/11/24 0905  BILITOT 1.5* 1.3* 1.6* 0.6  AST 34 33 40 64*  ALT 19 19 20 31   ALKPHOS 62 75 124 94  PROT 5.7* 7.0 6.5 5.6*  ALBUMIN 2.4* 3.4* 3.0* 2.7*    Assessment and Plan: 59 y.o. female with PBC/AIH cirrhosis, UGIB secondary to gastric varices, TIPS, coil embolization of posterior and left gastric veins, and BRTO by Dr. Jennefer on 10/10/24.  Procedure sites intact and healing well.  No dressings needed.  Attempting clear liquids.   Alert, but not completely oriented.  On lactulose  with good response and multiple bowel movements.  Renal function slightlly worse again today; CCM following for shock profile.   Continue current management.   IR will follow-up periodically to monitor status.   Electronically Signed: Kasiyah Platter Sue-Ellen Marcellina Jonsson, PA 10/12/2024, 4:27 PM   I spent a total of 15 Minutes at the the patient's bedside AND on the patient's hospital floor or unit, greater than 50% of which was counseling/coordinating care for cirrhosis with varices, GI bleed.       "

## 2024-10-13 ENCOUNTER — Encounter (HOSPITAL_COMMUNITY): Payer: Self-pay | Admitting: Internal Medicine

## 2024-10-13 ENCOUNTER — Other Ambulatory Visit (HOSPITAL_COMMUNITY): Payer: Self-pay

## 2024-10-13 ENCOUNTER — Telehealth (HOSPITAL_COMMUNITY): Payer: Self-pay

## 2024-10-13 DIAGNOSIS — D72829 Elevated white blood cell count, unspecified: Secondary | ICD-10-CM

## 2024-10-13 DIAGNOSIS — K7682 Hepatic encephalopathy: Secondary | ICD-10-CM | POA: Diagnosis not present

## 2024-10-13 DIAGNOSIS — K7469 Other cirrhosis of liver: Secondary | ICD-10-CM | POA: Diagnosis not present

## 2024-10-13 DIAGNOSIS — K922 Gastrointestinal hemorrhage, unspecified: Secondary | ICD-10-CM | POA: Diagnosis not present

## 2024-10-13 DIAGNOSIS — K743 Primary biliary cirrhosis: Secondary | ICD-10-CM | POA: Diagnosis not present

## 2024-10-13 DIAGNOSIS — I864 Gastric varices: Secondary | ICD-10-CM | POA: Diagnosis not present

## 2024-10-13 LAB — TYPE AND SCREEN
ABO/RH(D): A POS
Antibody Screen: NEGATIVE
Unit division: 0
Unit division: 0
Unit division: 0
Unit division: 0
Unit division: 0

## 2024-10-13 LAB — GLUCOSE, CAPILLARY
Glucose-Capillary: 162 mg/dL — ABNORMAL HIGH (ref 70–99)
Glucose-Capillary: 153 mg/dL — ABNORMAL HIGH (ref 70–99)
Glucose-Capillary: 159 mg/dL — ABNORMAL HIGH (ref 70–99)
Glucose-Capillary: 160 mg/dL — ABNORMAL HIGH (ref 70–99)

## 2024-10-13 LAB — CBC WITH DIFFERENTIAL/PLATELET
Abs Immature Granulocytes: 0.55 K/uL — ABNORMAL HIGH (ref 0.00–0.07)
Basophils Absolute: 0.1 K/uL (ref 0.0–0.1)
Basophils Relative: 0 %
Eosinophils Absolute: 0.1 K/uL (ref 0.0–0.5)
Eosinophils Relative: 1 %
HCT: 23.4 % — ABNORMAL LOW (ref 36.0–46.0)
Hemoglobin: 8 g/dL — ABNORMAL LOW (ref 12.0–15.0)
Immature Granulocytes: 4 %
Lymphocytes Relative: 14 %
Lymphs Abs: 2.2 K/uL (ref 0.7–4.0)
MCH: 31.6 pg (ref 26.0–34.0)
MCHC: 34.2 g/dL (ref 30.0–36.0)
MCV: 92.5 fL (ref 80.0–100.0)
Monocytes Absolute: 2.1 K/uL — ABNORMAL HIGH (ref 0.1–1.0)
Monocytes Relative: 14 %
Neutro Abs: 10.4 K/uL — ABNORMAL HIGH (ref 1.7–7.7)
Neutrophils Relative %: 67 %
Platelets: 117 K/uL — ABNORMAL LOW (ref 150–400)
RBC: 2.53 MIL/uL — ABNORMAL LOW (ref 3.87–5.11)
RDW: 14 % (ref 11.5–15.5)
WBC: 15.4 K/uL — ABNORMAL HIGH (ref 4.0–10.5)
nRBC: 1.7 % — ABNORMAL HIGH (ref 0.0–0.2)

## 2024-10-13 LAB — BPAM RBC
Blood Product Expiration Date: 202602032359
Blood Product Expiration Date: 202602032359
Blood Product Expiration Date: 202602032359
Blood Product Expiration Date: 202602032359
Blood Product Expiration Date: 202602032359
ISSUE DATE / TIME: 202601170312
Unit Type and Rh: 6200
Unit Type and Rh: 6200
Unit Type and Rh: 6200
Unit Type and Rh: 6200
Unit Type and Rh: 6200

## 2024-10-13 LAB — BASIC METABOLIC PANEL WITH GFR
Anion gap: 9 (ref 5–15)
BUN: 78 mg/dL — ABNORMAL HIGH (ref 6–20)
CO2: 23 mmol/L (ref 22–32)
Calcium: 7.8 mg/dL — ABNORMAL LOW (ref 8.9–10.3)
Chloride: 102 mmol/L (ref 98–111)
Creatinine, Ser: 2.13 mg/dL — ABNORMAL HIGH (ref 0.44–1.00)
GFR, Estimated: 26 mL/min — ABNORMAL LOW
Glucose, Bld: 152 mg/dL — ABNORMAL HIGH (ref 70–99)
Potassium: 3.7 mmol/L (ref 3.5–5.1)
Sodium: 133 mmol/L — ABNORMAL LOW (ref 135–145)

## 2024-10-13 MED ORDER — LACTULOSE 10 GM/15ML PO SOLN
10.0000 g | Freq: Two times a day (BID) | ORAL | Status: DC
  Administered 2024-10-13 – 2024-10-15 (×4): 10 g via ORAL
  Filled 2024-10-13 (×5): qty 15

## 2024-10-13 MED ORDER — INSULIN ASPART 100 UNIT/ML IJ SOLN
0.0000 [IU] | Freq: Three times a day (TID) | INTRAMUSCULAR | Status: DC
  Administered 2024-10-13 – 2024-10-17 (×5): 3 [IU] via SUBCUTANEOUS
  Administered 2024-10-17: 5 [IU] via SUBCUTANEOUS
  Administered 2024-10-17 – 2024-10-18 (×2): 2 [IU] via SUBCUTANEOUS
  Administered 2024-10-18 – 2024-10-19 (×4): 3 [IU] via SUBCUTANEOUS
  Administered 2024-10-19: 2 [IU] via SUBCUTANEOUS
  Administered 2024-10-20: 3 [IU] via SUBCUTANEOUS
  Administered 2024-10-20 – 2024-10-21 (×4): 2 [IU] via SUBCUTANEOUS
  Filled 2024-10-13 (×2): qty 2
  Filled 2024-10-13: qty 3
  Filled 2024-10-13: qty 2
  Filled 2024-10-13 (×4): qty 3
  Filled 2024-10-13: qty 2
  Filled 2024-10-13 (×2): qty 3
  Filled 2024-10-13 (×2): qty 2
  Filled 2024-10-13 (×2): qty 3
  Filled 2024-10-13 (×2): qty 2
  Filled 2024-10-13: qty 5

## 2024-10-13 MED ORDER — SODIUM CHLORIDE 0.9 % IV SOLN: INTRAVENOUS | Status: DC

## 2024-10-13 NOTE — Progress Notes (Signed)
 " Progress Note    Susan [name]   FMW:[id number]  DOB: 04-19-1966  DOA: 10/09/2024     3 PCP: Corwin Antu, FNP  Initial CC: Hematemesis  Hospital Course: Ms Susan [name] is a 59 yo female with PMH AI cirrhosis, esophageal varices, primary biliary cholangitis, hepatic encephalopathy, HTN, HLD, obesity who presented with multiple episodes of hematemesis. Was recently hospitalized December 2025 for hepatic encephalopathy with known esophageal varices. She received 1 unit PRBC on admission. She was admitted to the ICU initially and intubated for airway protection and need for EGD. She also underwent TIPS and coil embolization of posterior and left gastric veins.  Significant Hospital Events:  10/10/2024 admitted to the ICU for UGIB. Underwent EGD and then IR TIPS and coil embolization of posterior and left gastric veins with coil assisted retrograde transvenous obliteration of gastrorenal shunt. 10/11/2024: passed SBT this AM and extubated   Assessment and Plan   Hemorrhagic Shock Variceal bleeding - Received 1 unit PRBC on admission - s/p EGD on 1/17; large amount of clotted blood found in stomach.  Type I gastric varices - s/p TIPS, coil embolization of posterior and left gastric veins, and coil assisted retrograde transvenous obliteration of gastrorenal shunt; procedure performed 10/10/2024 -Has been treated with Rocephin  and octreotide  drip -Continue Coreg  - Continue trending Hgb - diet advancement per GI  AI Cirrhosis Hepatic encephalopathy -Frequent diarrhea, approximately 7 episodes; will decrease lactulose  dose - Continue rifaximin  - s/p TIPS, so need to watch for any worsening encephalopathy; currently awake and alert, just tired  AKI - Slow worsening of creatinine since admission. Given hypovolemic/hemorrhagic shock and still on CLD, suspect she is volume depleted overall. At risk for ATN with prolonged prerenal as well - trial of IVF today and repeat BMP in am - if still  worsening, will need more workup  Acute hypoxic respiratory failure -resolved - S/p intubation on admission and extubated on 10/11/2024 -Now on room air  DMII - continue SSI and CBGs for now  Interval History:  Patient transferred out of the ICU yesterday.  Daughter present bedside this morning.  She is comfortable but fatigued appearing.  Remains on clear liquids for now. Having frequent loose stools and adjusting her lactulose  today.   Antimicrobials: Rocephin  10/10/2024 >> current  Consultants:  GI IR  Procedures:  10/10/2024: EGD 10/10/2024: 1) TIPS creation 2) Coil embolization of posterior and left gastric veins 3) Coil assisted retrograde transvenous obliteration of gastrorenal shunt  DVT prophylaxis:  heparin  injection 5,000 Units Start: 10/12/24 1400 Place and maintain sequential compression device Start: 10/10/24 0859 SCDs Start: 10/10/24 0256   Code Status:   Code Status: Full Code  Barriers to discharge: None Therapy evaluation: PT Orders: Active   PT Follow up Rec:   Disposition Plan: Pending PT evaluation Status is: Inpatient  Mobility Assessment (Last 72 Hours)     Mobility Assessment     Row Name 10/12/24 2020 10/12/24 1345 10/12/24 0800 10/11/24 2000 10/11/24 0800   Does the patient have exclusion criteria? No- Perform mobility assessment No- Perform mobility assessment No- Perform mobility assessment No- Perform mobility assessment No- Perform mobility assessment   Mobility Assessment Exclusion Criteria No exclusion criteria present, perform mobility assessment No exclusion criteria present, perform mobility assessment No exclusion criteria present, perform mobility assessment No exclusion criteria present, perform mobility assessment No exclusion criteria present, perform mobility assessment   What is the highest level of mobility based on the mobility assessment? Level 3 (Stands with assistance) - Balance  while standing  and cannot march in place Level  2 (Chairfast) - Balance while sitting on edge of bed and cannot stand Level 2 (Chairfast) - Balance while sitting on edge of bed and cannot stand Level 1 (Bedfast) - Unable to balance while sitting on edge of bed Level 2 (Chairfast) - Balance while sitting on edge of bed and cannot stand   Is the above level different from baseline mobility prior to current illness? Yes - Recommend PT order Yes - Recommend PT order Yes - Recommend PT order Yes - Recommend PT order Yes - Recommend PT order    Row Name 10/10/24 2000           Does the patient have exclusion criteria? Yes- Bedfast (Level 1) - Select exclusion criteria in next row       What is the highest level of mobility based on the mobility assessment? Level 1 (Bedfast) - Unable to balance while sitting on edge of bed       Is the above level different from baseline mobility prior to current illness? No - Consider discontinuing PT/OT          Diet: Diet Orders (From admission, onward)     Start     Ordered   10/11/24 1054  Diet clear liquid Room service appropriate? Yes; Fluid consistency: Thin  Diet effective now       Question Answer Comment  Room service appropriate? Yes   Fluid consistency: Thin      10/11/24 1053            Objective: Blood pressure (!) 104/58, pulse 70, temperature 98.2 F (36.8 C), temperature source Oral, resp. rate 18, height 5' (1.524 m), weight 93.3 kg, SpO2 99%.  Examination:  Physical Exam Constitutional:      Comments: Fatigued appearing but no distress  HENT:     Head: Normocephalic and atraumatic.     Mouth/Throat:     Mouth: Mucous membranes are moist.  Eyes:     Extraocular Movements: Extraocular movements intact.  Cardiovascular:     Rate and Rhythm: Normal rate and regular rhythm.  Pulmonary:     Effort: Pulmonary effort is normal. No respiratory distress.     Breath sounds: Normal breath sounds. No wheezing.  Abdominal:     General: Bowel sounds are normal. There is no  distension.     Palpations: Abdomen is soft.     Tenderness: There is no abdominal tenderness.  Musculoskeletal:        General: Normal range of motion.     Cervical back: Normal range of motion and neck supple.  Skin:    General: Skin is warm and dry.  Neurological:     General: No focal deficit present.     Mental Status: She is alert.  Psychiatric:        Mood and Affect: Mood normal.      Data Reviewed: Results for orders placed or performed during the hospital encounter of 10/09/24 (from the past 24 hours)  Glucose, capillary     Status: Abnormal   Collection Time: 10/12/24  4:16 PM  Result Value Ref Range   Glucose-Capillary 211 (H) 70 - 99 mg/dL   Comment 1 Notify RN   CBC with Differential/Platelet     Status: Abnormal   Collection Time: 10/12/24  8:19 PM  Result Value Ref Range   WBC 18.7 (H) 4.0 - 10.5 K/uL   RBC 2.46 (L) 3.87 - 5.11 MIL/uL  Hemoglobin 7.9 (L) 12.0 - 15.0 g/dL   HCT 76.9 (L) 63.9 - 53.9 %   MCV 93.5 80.0 - 100.0 fL   MCH 32.1 26.0 - 34.0 pg   MCHC 34.3 30.0 - 36.0 g/dL   RDW 85.9 88.4 - 84.4 %   Platelets 114 (L) 150 - 400 K/uL   nRBC 2.4 (H) 0.0 - 0.2 %   Neutrophils Relative % 65 %   Neutro Abs 12.2 (H) 1.7 - 7.7 K/uL   Lymphocytes Relative 16 %   Lymphs Abs 2.9 0.7 - 4.0 K/uL   Monocytes Relative 13 %   Monocytes Absolute 2.5 (H) 0.1 - 1.0 K/uL   Eosinophils Relative 1 %   Eosinophils Absolute 0.2 0.0 - 0.5 K/uL   Basophils Relative 0 %   Basophils Absolute 0.1 0.0 - 0.1 K/uL   Immature Granulocytes 5 %   Abs Immature Granulocytes 0.86 (H) 0.00 - 0.07 K/uL  Glucose, capillary     Status: Abnormal   Collection Time: 10/12/24  9:46 PM  Result Value Ref Range   Glucose-Capillary 142 (H) 70 - 99 mg/dL  CBC with Differential/Platelet     Status: Abnormal   Collection Time: 10/13/24  4:58 AM  Result Value Ref Range   WBC 15.4 (H) 4.0 - 10.5 K/uL   RBC 2.53 (L) 3.87 - 5.11 MIL/uL   Hemoglobin 8.0 (L) 12.0 - 15.0 g/dL   HCT 76.5 (L)  63.9 - 46.0 %   MCV 92.5 80.0 - 100.0 fL   MCH 31.6 26.0 - 34.0 pg   MCHC 34.2 30.0 - 36.0 g/dL   RDW 85.9 88.4 - 84.4 %   Platelets 117 (L) 150 - 400 K/uL   nRBC 1.7 (H) 0.0 - 0.2 %   Neutrophils Relative % 67 %   Neutro Abs 10.4 (H) 1.7 - 7.7 K/uL   Lymphocytes Relative 14 %   Lymphs Abs 2.2 0.7 - 4.0 K/uL   Monocytes Relative 14 %   Monocytes Absolute 2.1 (H) 0.1 - 1.0 K/uL   Eosinophils Relative 1 %   Eosinophils Absolute 0.1 0.0 - 0.5 K/uL   Basophils Relative 0 %   Basophils Absolute 0.1 0.0 - 0.1 K/uL   Immature Granulocytes 4 %   Abs Immature Granulocytes 0.55 (H) 0.00 - 0.07 K/uL  Glucose, capillary     Status: Abnormal   Collection Time: 10/13/24  8:01 AM  Result Value Ref Range   Glucose-Capillary 162 (H) 70 - 99 mg/dL  Glucose, capillary     Status: Abnormal   Collection Time: 10/13/24 11:33 AM  Result Value Ref Range   Glucose-Capillary 153 (H) 70 - 99 mg/dL    I have reviewed pertinent nursing notes, vitals, labs, and images as necessary. I have ordered labwork to follow up on as indicated.  I have reviewed the last notes from staff over past 24 hours. I have discussed patient's care plan and test results with nursing staff, CM/SW, and other staff as appropriate.  Old records reviewed in assessment of this patient  Time spent: Greater than 50% of the 55 minute visit was spent in counseling/coordination of care for the patient as laid out in the A&P.   LOS: 3 days   Alm Apo, MD [facility] 10/13/2024, 11:54 AM "

## 2024-10-13 NOTE — Telephone Encounter (Signed)
 Pharmacy Patient Advocate Encounter  Insurance verification completed.    The patient is insured through [facility] MEDICAID which no longer covers Xifaxan  at all.

## 2024-10-13 NOTE — TOC CM/SW Note (Signed)
" °  °  Durable Medical Equipment  (From admission, onward)           Start     Ordered   10/13/24 1251  For home use only DME 3 n 1  Once        10/13/24 1250   10/13/24 1250  For home use only DME Walker rolling  Once       Question Answer Comment  Walker: With 5 Inch Wheels   Patient needs a walker to treat with the following condition Weakness      10/13/24 1250             Patient needs bedside commode ( 3 in 1 )  due to being confined to a room with no bathroom  "

## 2024-10-13 NOTE — Evaluation (Signed)
 Physical Therapy Evaluation Patient Details Name: Susan [name] MRN: [id number] DOB: March 23, 1966 Today's Date: 10/13/2024  History of Present Illness  Pt is a 59 y.o. female who presented 10/09/24 due to multiple episodes of bloody emesis. CT showed a nodular liver compatible with cirrhosis. Pt was admitted to ICU 1/17-19.  Pt s/p TIPS and coil embolization of posterior and L gastric veins 10/10/24.  PMH: autoimmune liver cirrhosis, hepatic encephalopathy, esophageal varices, primary biliary cholangitis, HTN, HLD, obesity  Clinical Impression  PTA, pt lives with her spouse and works in [employer]. Pt presents with decreased functional mobility secondary to decreased activity tolerance/endurance, weakness, impaired cognition, and standing balance. Pt ambulating very short distance with RW with minA and assist for RW management. Pt reports increased bowel frequency and fatigue. Suspect she will progress well. Will continue to follow acutely.      If plan is discharge home, recommend the following: A little help with walking and/or transfers;A little help with bathing/dressing/bathroom;Assistance with cooking/housework;Assist for transportation;Help with stairs or ramp for entrance;Supervision due to cognitive status   Can travel by private vehicle        Equipment Recommendations Rolling walker (2 wheels);BSC/3in1  Recommendations for Other Services       Functional Status Assessment Patient has had a recent decline in their functional status and demonstrates the ability to make significant improvements in function in a reasonable and predictable amount of time.     Precautions / Restrictions Precautions Precautions: Fall Recall of Precautions/Restrictions: Intact Restrictions Weight Bearing Restrictions Per Provider Order: No      Mobility  Bed Mobility Overal bed mobility: Needs Assistance Bed Mobility: Supine to Sit, Sit to Supine     Supine to sit: Supervision, HOB elevated, Used  rails Sit to supine: Supervision, HOB elevated, Used rails   General bed mobility comments: cues on how to complete with increase in time    Transfers Overall transfer level: Needs assistance Equipment used: Rolling walker (2 wheels) Transfers: Sit to/from Stand, Bed to chair/wheelchair/BSC Sit to Stand: Min assist Stand pivot transfers: Min assist         General transfer comment: MinA to power up to standing position, verbal cues for hand placement    Ambulation/Gait Ambulation/Gait assistance: Min assist Gait Distance (Feet): 5 Feet Assistive device: Rolling walker (2 wheels) Gait Pattern/deviations: Step-through pattern, Decreased stride length Gait velocity: decreased Gait velocity interpretation: <1.31 ft/sec, indicative of household ambulator   General Gait Details: Very slowed pace, assist for steering/navigating RW  Stairs            Wheelchair Mobility     Tilt Bed    Modified Rankin (Stroke Patients Only)       Balance Overall balance assessment: Needs assistance Sitting-balance support: Feet supported Sitting balance-Leahy Scale: Good     Standing balance support: Bilateral upper extremity supported Standing balance-Leahy Scale: Fair Standing balance comment: most of the time reliant on RW for BUE support but took 2 steps to [facility] on second attempt with unilateral support                             Pertinent Vitals/Pain Pain Assessment Pain Assessment: Faces Faces Pain Scale: Hurts even more Pain Location: abdomen Pain Descriptors / Indicators: Discomfort Pain Intervention(s): Monitored during session, Limited activity within patient's tolerance    Home Living Family/patient expects to be discharged to:: Private residence Living Arrangements: Spouse/significant other Available Help at Discharge: Family;Available PRN/intermittently Type  of Home: House Home Access: Stairs to enter Entrance Stairs-Rails: Left Entrance  Stairs-Number of Steps: 5 Alternate Level Stairs-Number of Steps: flight Home Layout: Two level Home Equipment: Shower seat - built in      Prior Function Prior Level of Function : Independent/Modified Independent             Mobility Comments: Works in [employer] ADLs Comments: indep     Extremity/Trunk Assessment   Upper Extremity Assessment Upper Extremity Assessment: Defer to OT evaluation    Lower Extremity Assessment Lower Extremity Assessment: Overall WFL for tasks assessed    Cervical / Trunk Assessment Cervical / Trunk Assessment: Kyphotic  Communication   Communication Communication: Impaired Factors Affecting Communication: Reduced clarity of speech (very soft spoken)    Cognition Arousal: Alert Behavior During Therapy: WFL for tasks assessed/performed   PT - Cognitive impairments: Initiation, Sequencing, Problem solving, Memory                       PT - Cognition Comments: Reports she lost the last 2 days. Slow processing Following commands: Intact (but slow)       Cueing Cueing Techniques: Verbal cues     General Comments      Exercises     Assessment/Plan    PT Assessment Patient needs continued PT services  PT Problem List Decreased strength;Decreased activity tolerance;Decreased mobility;Decreased balance;Decreased cognition       PT Treatment Interventions DME instruction;Gait training;Stair training;Functional mobility training;Therapeutic activities;Therapeutic exercise;Balance training;Patient/family education    PT Goals (Current goals can be found in the Care Plan section)  Acute Rehab PT Goals Patient Stated Goal: less bowel movements PT Goal Formulation: With patient Time For Goal Achievement: 10/27/24 Potential to Achieve Goals: Good    Frequency Min 2X/week     Co-evaluation PT/OT/SLP Co-Evaluation/Treatment: Yes Reason for Co-Treatment: To address functional/ADL transfers PT goals addressed during session:  Mobility/safety with mobility OT goals addressed during session: ADL's and self-care       AM-PAC PT 6 Clicks Mobility  Outcome Measure Help needed turning from your back to your side while in a flat bed without using bedrails?: A Little Help needed moving from lying on your back to sitting on the side of a flat bed without using bedrails?: A Little Help needed moving to and from a bed to a chair (including a wheelchair)?: A Little Help needed standing up from a chair using your arms (e.g., wheelchair or bedside chair)?: A Little Help needed to walk in hospital room?: A Little Help needed climbing 3-5 steps with a railing? : Total 6 Click Score: 16    End of Session Equipment Utilized During Treatment: Gait belt Activity Tolerance: Patient tolerated treatment well Patient left: in bed;with call bell/phone within reach;with bed alarm set Nurse Communication: Mobility status PT Visit Diagnosis: Difficulty in walking, not elsewhere classified (R26.2);Unsteadiness on feet (R26.81)    Time: 8985-8961 PT Time Calculation (min) (ACUTE ONLY): 24 min   Charges:   PT Evaluation $PT Eval Low Complexity: 1 Low   PT General Charges $$ ACUTE PT VISIT: 1 Visit         Aleck Daring, PT, DPT Acute Rehabilitation Services Office [phone]   Aleck ONEIDA Daring 10/13/2024, 12:41 PM

## 2024-10-13 NOTE — Evaluation (Signed)
 Occupational Therapy Evaluation Patient Details Name: Susan [name] MRN: [id number] DOB: [birth year] Today's Date: 10/13/2024   History of Present Illness   Pt is a 59 y.o. female who presented 10/09/24 due to multiple episodes of bloody emesis. CT showed a nodular liver compatible with cirrhosis. Pt was admitted to ICU 1/17-19.  Pt s/p TIPS and coil embolization of posterior and L gastric veins 10/10/24.  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 At this time she reported that she is feeling very weak but agreeable to attempt to work with therapies. At this time completed bed mobility with supervision, sit to stand with CGA-min x2, required peri care with total assist.  Then was able to ambulate to/from [facility] placed about 3 ft away but required RW with min x 1-2 assist. Pt then once sitting at EOB reported they needed to use BSC and was able to step pivot with CGA-min assist to [facility] and had small BM. She then required total assist with peri care and wanted to go back to bed. At this time Acute Occupational Therapy to folow and recommendation for [facility] services at discharge.      If plan is discharge home, recommend the following:   A little help with walking and/or transfers;A lot of help with bathing/dressing/bathroom;Assistance with cooking/housework;Direct supervision/assist for medications management;Direct supervision/assist for financial management;Assist for transportation;Help with stairs or ramp for entrance;Supervision due to cognitive status     Functional Status Assessment   Patient has had a recent decline in their functional status and demonstrates the ability to make significant improvements in function in a reasonable and predictable amount of time.     Equipment Recommendations   BSC/3in1 (RW)      Recommendations for Other Services         Precautions/Restrictions   Precautions Recall of Precautions/Restrictions: Intact Restrictions Weight Bearing Restrictions Per Provider Order: No     Mobility Bed Mobility Overal bed mobility: Needs Assistance Bed Mobility: Supine to Sit, Sit to Supine     Supine to sit: Supervision, HOB elevated, Used rails Sit to supine: Supervision, HOB elevated, Used rails   General bed mobility comments: cues on how to complete with increase in time    Transfers Overall transfer level: Needs assistance Equipment used: Rolling walker (2 wheels) Transfers: Sit to/from Stand Sit to Stand: Contact guard assist, Min assist           General transfer comment: min assist to stabilize RW as pt wanted to pull up to      Balance Overall balance assessment: Needs assistance Sitting-balance support: Feet supported Sitting balance-Leahy Scale: Good     Standing balance support: Bilateral upper extremity supported Standing balance-Leahy Scale: Fair Standing balance comment: most of the time reliant on RW for BUE support but took 2 steps to [facility] on second attempt with unilateral support                           ADL either performed or assessed with clinical judgement   ADL Overall ADL's : Needs assistance/impaired Eating/Feeding: Sitting;Set up Eating/Feeding Details (indicate cue type and reason): per daughter not eating much Grooming: Wash/dry hands;Set up;Sitting   Upper Body Bathing: Set up;Sitting   Lower Body Bathing: Minimal assistance;Moderate assistance;Sit to/from stand   Upper Body Dressing :  Set up;Sitting   Lower Body Dressing: Minimal assistance;Moderate assistance;Sit to/from stand   Toilet Transfer: Minimal assistance;+2 for physical assistance;+2 for safety/equipment;Cueing for safety;Cueing for sequencing Toilet Transfer Details (indicate cue type and reason): BSC about 3 ft from bed Toileting-  Clothing Manipulation and Hygiene: Total assistance;Sit to/from stand Toileting - Clothing Manipulation Details (indicate cue type and reason): pt reported feeling to weak to complete     Functional mobility during ADLs: Minimal assistance;Cueing for safety;Cueing for sequencing;Rolling walker (2 wheels)       Vision         Perception         Praxis         Pertinent Vitals/Pain Pain Assessment Pain Assessment: Faces Faces Pain Scale: Hurts even more Pain Location: abdomen Pain Descriptors / Indicators: Discomfort Pain Intervention(s): Limited activity within patient's tolerance, Monitored during session     Extremity/Trunk Assessment Upper Extremity Assessment Upper Extremity Assessment: Generalized weakness   Lower Extremity Assessment Lower Extremity Assessment: Defer to PT evaluation   Cervical / Trunk Assessment Cervical / Trunk Assessment: Kyphotic   Communication Communication Factors Affecting Communication: Reduced clarity of speech (very soft spoken)   Cognition Arousal: Alert Behavior During Therapy: WFL for tasks assessed/performed Cognition: Cognition impaired         Attention impairment (select first level of impairment): Divided attention, Alternating attention Executive functioning impairment (select all impairments): Problem solving, Reasoning, Sequencing                   Following commands: Intact (but slow)       Cueing  General Comments   Cueing Techniques: Verbal cues      Exercises     Shoulder Instructions      Home Living Family/patient expects to be discharged to:: Private residence Living Arrangements: Spouse/significant other Available Help at Discharge: Family;Available PRN/intermittently Type of Home: House Home Access: Stairs to enter Entrance Stairs-Number of Steps: 5 Entrance Stairs-Rails: Left Home Layout: Two level Alternate Level Stairs-Number of Steps: flight Alternate Level Stairs-Rails:  Left Bathroom Shower/Tub: Producer, Television/film/video: Handicapped height     Home Equipment: Shower seat - built in          Prior Functioning/Environment Prior Level of Function : Independent/Modified Independent             Mobility Comments: Works in [employer] ADLs Comments: indep    OT Problem List: Decreased activity tolerance;Impaired balance (sitting and/or standing);Decreased knowledge of use of DME or AE;Pain   OT Treatment/Interventions: Self-care/ADL training;Therapeutic exercise;DME and/or AE instruction;Therapeutic activities;Patient/family education;Balance training      OT Goals(Current goals can be found in the care plan section)   Acute Rehab OT Goals Patient Stated Goal: to get better OT Goal Formulation: With patient Time For Goal Achievement: 10/27/24 Potential to Achieve Goals: Good   OT Frequency:  Min 2X/week    Co-evaluation PT/OT/SLP Co-Evaluation/Treatment: Yes Reason for Co-Treatment: Complexity of the patient's impairments (multi-system involvement)   OT goals addressed during session: ADL's and self-care      AM-PAC OT 6 Clicks Daily Activity     Outcome Measure Help from another person eating meals?: None Help from another person taking care of personal grooming?: None Help from another person toileting, which includes using toliet, bedpan, or urinal?: A Lot Help from another person bathing (including washing, rinsing, drying)?: A Lot Help from another person to put on and taking off regular upper body clothing?: A Little Help from another person  to put on and taking off regular lower body clothing?: A Lot 6 Click Score: 17   End of Session Equipment Utilized During Treatment: Gait belt;Rolling walker (2 wheels) Nurse Communication: Mobility status  Activity Tolerance: Patient limited by fatigue Patient left: in bed;with call bell/phone within reach;with bed alarm set;with family/visitor present (daughter)  OT Visit  Diagnosis: Muscle weakness (generalized) (M62.81);Unsteadiness on feet (R26.81);Pain Pain - Right/Left:  (abdomen)                Time: 8985-8961 OT Time Calculation (min): 24 min Charges:  OT General Charges $OT Visit: 1 Visit OT Evaluation $OT Eval Moderate Complexity: 1 Mod Chasta Deshpande K OTR/L  Acute Rehab Services  [phone] office number   Warrick Berber 10/13/2024, 11:54 AM

## 2024-10-13 NOTE — Plan of Care (Signed)
   Problem: Education: Goal: Ability to describe self-care measures that may prevent or decrease complications (Diabetes Survival Skills Education) will improve Outcome: Progressing

## 2024-10-13 NOTE — TOC Initial Note (Signed)
 Transition of Care (TOC) - Initial/Assessment Note   Spoke to patient and daughter Tiara at bedside.   Patient from home with spouse. Tiara visits frequently .   Discussed PT/OT recommendations for HHPT/OT , rolling walker and bedside commode. They are in agreement .   Burnard with [facility] accepted [facility] referral asked for order for [facility] also. Orders entered and MD signed.   Referral for DME sent to [facility] with [facility]  Patient Details  Name: Susan [name] MRN: [id number] Date of Birth: 1965-11-13  Transition of Care [facility]) CM/SW Contact:    Stephane Powell Jansky, RN Phone Number: 10/13/2024, 2:00 PM  Clinical Narrative:                   Expected Discharge Plan: Home w Home Health Services Barriers to Discharge: Continued Medical Work up   Patient Goals and CMS Choice Patient states their goals for this hospitalization and ongoing recovery are:: t return to home CMS Medicare.gov Compare Post Acute Care list provided to:: Patient Choice offered to / list presented to : Patient      Expected Discharge Plan and Services In-house Referral: NA Discharge Planning Services: CM Consult Post Acute Care Choice: Home Health, Durable Medical Equipment Living arrangements for the past 2 months: Single Family Home                 DME Arranged: 3-N-1, Walker rolling DME Agency: [facility] Date DME Agency Contacted: 10/13/24 Time DME Agency Contacted: 1359 Representative spoke with at DME Agency: Arthea HH Arranged: PT, OT, RN HH Agency: [facility] Date [facility] Agency Contacted: 10/13/24 Time HH Agency Contacted: 1359 Representative spoke with at [facility] Agency: Burnard  Prior Living Arrangements/Services Living arrangements for the past 2 months: Single Family Home Lives with:: Spouse Patient language and need for interpreter reviewed:: Yes Do you feel safe going back to the place where you live?: Yes      Need for Family Participation in Patient Care: Yes  (Comment) Care giver support system in place?: Yes (comment)   Criminal Activity/Legal Involvement Pertinent to Current Situation/Hospitalization: No - Comment as needed  Activities of Daily Living   ADL Screening (condition at time of admission) Independently performs ADLs?: Yes (appropriate for developmental age) Is the patient deaf or have difficulty hearing?: No Does the patient have difficulty seeing, even when wearing glasses/contacts?: No Does the patient have difficulty concentrating, remembering, or making decisions?: No  Permission Sought/Granted   Permission granted to share information with : Yes, Verbal Permission Granted  Share Information with NAME: daughter Shyteria Lewis  Permission granted to share info w AGENCY: [employer] , [facility]        Emotional Assessment Appearance:: Appears stated age Attitude/Demeanor/Rapport: Engaged Affect (typically observed): Appropriate Orientation: : Oriented to Self, Oriented to Place, Oriented to  Time, Oriented to Situation Alcohol / Substance Use: Not Applicable Psych Involvement: No (comment)  Admission diagnosis:  GI bleeding [K92.2] Upper GI bleed [K92.2] Hematemesis with nausea [K92.0] Patient Active Problem List   Diagnosis Date Noted   S/P TIPS (transjugular intrahepatic portosystemic shunt) 10/12/2024   Upper GI bleed 10/10/2024   Hematemesis with nausea 10/10/2024   Portal hypertension (HCC) 10/10/2024   Autoimmune hepatitis (HCC) 08/29/2024   Hepatic cirrhosis due to primary biliary cholangitis (HCC) 08/29/2024   Encephalopathy, hepatic (HCC) 08/28/2024   Hyperglycemia 08/28/2024   Drug-induced constipation 08/17/2024   Calculus of gallbladder without cholecystitis without obstruction 06/10/2024   Myalgia due to statin 05/07/2023  Statin intolerance 05/07/2023   Heart murmur 11/05/2022   Vitamin D  deficiency 11/05/2022   Gastric varix 11/05/2022   Iron deficiency anemia due to chronic blood loss 11/05/2022    Hyperlipidemia 12/21/2019   Primary biliary cholangitis (HCC) 01/02/2018   Essential hypertension 01/22/2017   PCP:  Corwin Antu, FNP Pharmacy:   [id number] - [place], [place] - [address] AT [facility] OF [address] [facility] [address] & [address] [place] [address] Phone: [phone] Fax: [phone]  [facility] - [place], [place] - [address] [place] [address] Phone: [phone] Fax: [phone]     Social Drivers of Health (SDOH) Social History: SDOH Screenings   Food Insecurity: No Food Insecurity (10/10/2024)  Housing: Low Risk (10/10/2024)  Transportation Needs: No Transportation Needs (10/10/2024)  Utilities: Not At Risk (10/10/2024)  Alcohol Screen: Low Risk (05/07/2023)  Depression (PHQ2-9): Medium Risk (08/17/2024)  Financial Resource Strain: Low Risk (08/17/2024)  Physical Activity: Insufficiently Active (08/17/2024)  Social Connections: Moderately Integrated (08/17/2024)  Stress: No Stress Concern Present (08/17/2024)  Tobacco Use: Low Risk (10/10/2024)   SDOH Interventions:     Readmission Risk Interventions     No data to display

## 2024-10-13 NOTE — Hospital Course (Addendum)
 Susan [name] is a 59 y.o. female with a history of primary biliary cholangitis with cirrhosis, hepatic encephalopathy, hypertension, hyperlipidemia, obesity.  Patient presented secondary to hematemesis with concern for upper GI bleeding. Patient was intubated for airway protection and admitted to ICU. GI consulted and patient underwent upper GI endoscopy revealing evidence of recent upper GI bleeding. IR consulted for TIPS creation which was complicated by need for TIPS thrombectomy. Hepatic encephalopathy improved with treatment. AKI improved with supportive care and time.

## 2024-10-13 NOTE — Progress Notes (Signed)
 "   Susan [name] Progress Note     Patient Profile/Chief Complaint  59 year old Susan [name] [name] with a past medical history of autoimmune liver cirrhosis, hepatic encephalopathy, gastric varices, primary biliary cholangitis, hypertension, hyperlipidemia, obesity who presented to the hospital with multiple episodes of bloody emesis secondary to bleeding gastric varix.  EGD 10/10/2024 - no esophageal varices, clotted blood in the cardia, gastric fundus and in the gastric body, large amount of type I isolated gastric varix in the cardia and fundus with no active bleeding but did have stigmata of recent bleeding and hematoma, medium in size  CTAP with new finding of spontaneous splenorenal shunt   10/10/2024  status post TIPS, coil embolization of posterior left gastric veins and coil assisted retrograde transvenous obliteration of gastrorenal shunt  Transferred from ICU to floor 10/12/2024.   Interval History   -- Fatigued today from OT/PT but less somnolent -- Continuing to complain of abdominal pain status post TIPS but appears to be improving -- Tolerating vegetable soup and Jell-O -- 7 bowel movements recorded in last 24 hours -- Labs today: WBC 15.4, Hgb 8, HCT 23.4, platelets 117, sodium 133, BUN 79, creatinine 2.13  MELD 3.0: 22 at 10/13/2024 11:27 AM MELD-Na: 19 at 10/13/2024 11:27 AM Calculated from: Serum Creatinine: 2.13 mg/dL at 8/79/7973 88:72 AM Serum Sodium: 133 mmol/L at 10/13/2024 11:27 AM Total Bilirubin: 0.6 mg/dL (Using min of 1 mg/dL) at 8/81/7973  0:94 AM Serum Albumin: 2.7 g/dL at 8/81/7973  0:94 AM INR(ratio): 1.3 at 10/11/2024  9:05 AM Age at listing (hypothetical): 59 years Sex: Susan [name] [name] at 10/13/2024 11:27 AM    Objective   Vital signs in last 24 hours: Temp:  [98.1 F (36.7 C)-98.2 F (36.8 C)] 98.2 F (36.8 C) (01/20 1014) Pulse Rate:  [70-90] 70 (01/20 1014) Resp:  [18] 18 (01/20 1014) BP: (104-150)/(58-74) 104/58 (01/20 1014) SpO2:  [97 %-100 %] 99 % (01/20  1014) Weight:  [93.3 kg] 93.3 kg (01/20 0500) Last BM Date : 10/12/24 General:    Sleepy but less somnolent than yesterday and more verbal Heart:  Regular rate and rhythm; no murmurs Lungs: Respirations even and unlabored, lungs CTA bilaterally Abdomen:  Soft, nontender and nondistended. Normal bowel sounds. Extremities:  Without edema. Neurologic: Sleepy but more verbal than yesterday   Intake/Output from previous day: 01/19 0701 - 01/20 0700 In: 462.3 [P.O.:240; I.V.:72.3; IV Piggyback:150] Out: 620 [Urine:490; Stool:130] Intake/Output this shift: Total I/O In: 360 [P.O.:360] Out: -   Lab Results: Recent Labs    10/12/24 0557 10/12/24 2019 10/13/24 0458  WBC 19.4* 18.7* 15.4*  HGB 8.2* 7.9* 8.0*  HCT 23.9* 23.0* 23.4*  PLT 114* 114* 117*   BMET Recent Labs    10/11/24 0905 10/12/24 0557 10/13/24 1127  NA 137 136 133*  K 4.7 4.0 3.7  CL 106 106 102  CO2 19* 20* 23  GLUCOSE 230* 172* 152*  BUN 62* 79* 78*  CREATININE 2.30* 2.65* 2.13*  CALCIUM  8.0* 7.8* 7.8*   LFT Recent Labs    10/11/24 0905  PROT 5.6*  ALBUMIN 2.7*  AST 64*  ALT 31  ALKPHOS 94  BILITOT 0.6   PT/INR Recent Labs    10/11/24 0905  LABPROT 16.8*  INR 1.3*   Ammonia 42  Studies/Results: No results found.   Endoscopic Studies: EGD 10/10/2024 - no esophageal varices, clotted blood in the cardia, gastric fundus and in the gastric body, large amount of type I isolated gastric varix in the cardia and fundus with no  active bleeding but did have stigmata of recent bleeding and hematoma, medium in size     Clinical Impression   59 year old Susan [name] [name] with a past medical history of autoimmune liver cirrhosis, hepatic encephalopathy, gastric varices, primary biliary cholangitis, hypertension, hyperlipidemia, obesity who presented to the hospital with multiple episodes of bloody emesis.   EGD 10/10/2024 - no esophageal varices, clotted blood in the cardia, gastric fundus and in the gastric  body, large amount of type I isolated gastric varix in the cardia and fundus with no active bleeding but did have stigmata of recent bleeding and hematoma, medium in size  CTAP with new finding of spontaneous splenorenal shunt   10/10/2024  status post TIPS, coil embolization of posterior left gastric veins and coil assisted retrograde transvenous obliteration of gastrorenal shunt  Susan [name] Susan [name] [name] has been clinically stable s/p TIPS /BRTO without evidence of recurrent bleeding.  Hemoglobin stable today at 8.0.  She was transferred out of the ICU 10/12/2024.  She has been somnolent and manifesting symptoms of hepatic encephalopathy likely exacerbated status post TIPS.  Ammonia has decreased with lactulose , rifaximin  and zinc .  She had 7 bowel movements in the last 24 hours.  In the setting of AKI reasonable to decrease lactulose  dosing.  Leukocytosis felt to be reactive and improving.  Currently afebrile.  Labs noteworthy for AKI with BUN 78/ Cr 2.13 -multifactorial related to shock, possible contrast nephropathy   Plan  Agree with decreasing lactulose  to 10 g p.o. twice daily and titrating to 3-4 bowel movements per day Continue rifaximin  550 mg p.o. twice daily Continue zinc  220 mg daily Monitor serial H&H and maintain hemoglobin greater than 7 Complete 5-day course of ceftriaxone  -last day 10/14/2024 TIPS care per IR Continue Coreg  3.125 mg p.o. twice daily Continue pantoprazole  40 mg IV daily If renal function begins to worsen consider nephrology consultation Continue clear liquid diet and advance as tolerated Follow-up with [facility] hepatology/ Susan [name] Quest, NP after hospitalization   LOS: 3 days   Susan [name] Susan [name] [name]  10/13/2024, 2:28 PM  Susan [name] Hausen, MD [facility]  "

## 2024-10-14 ENCOUNTER — Inpatient Hospital Stay (HOSPITAL_COMMUNITY)

## 2024-10-14 DIAGNOSIS — I864 Gastric varices: Secondary | ICD-10-CM | POA: Diagnosis not present

## 2024-10-14 DIAGNOSIS — K922 Gastrointestinal hemorrhage, unspecified: Secondary | ICD-10-CM | POA: Diagnosis not present

## 2024-10-14 DIAGNOSIS — K743 Primary biliary cirrhosis: Secondary | ICD-10-CM | POA: Diagnosis not present

## 2024-10-14 DIAGNOSIS — K7682 Hepatic encephalopathy: Secondary | ICD-10-CM | POA: Diagnosis not present

## 2024-10-14 DIAGNOSIS — K766 Portal hypertension: Secondary | ICD-10-CM | POA: Diagnosis not present

## 2024-10-14 DIAGNOSIS — K7469 Other cirrhosis of liver: Secondary | ICD-10-CM | POA: Diagnosis not present

## 2024-10-14 DIAGNOSIS — Z95828 Presence of other vascular implants and grafts: Secondary | ICD-10-CM | POA: Diagnosis not present

## 2024-10-14 LAB — CBC WITH DIFFERENTIAL/PLATELET
Abs Immature Granulocytes: 0.35 K/uL — ABNORMAL HIGH (ref 0.00–0.07)
Basophils Absolute: 0.1 K/uL (ref 0.0–0.1)
Basophils Relative: 0 %
Eosinophils Absolute: 0.3 K/uL (ref 0.0–0.5)
Eosinophils Relative: 3 %
HCT: 19.5 % — ABNORMAL LOW (ref 36.0–46.0)
Hemoglobin: 6.6 g/dL — CL (ref 12.0–15.0)
Immature Granulocytes: 3 %
Lymphocytes Relative: 18 %
Lymphs Abs: 2.4 K/uL (ref 0.7–4.0)
MCH: 32 pg (ref 26.0–34.0)
MCHC: 33.8 g/dL (ref 30.0–36.0)
MCV: 94.7 fL (ref 80.0–100.0)
Monocytes Absolute: 2.2 K/uL — ABNORMAL HIGH (ref 0.1–1.0)
Monocytes Relative: 17 %
Neutro Abs: 7.6 K/uL (ref 1.7–7.7)
Neutrophils Relative %: 59 %
Platelets: 123 K/uL — ABNORMAL LOW (ref 150–400)
RBC: 2.06 MIL/uL — ABNORMAL LOW (ref 3.87–5.11)
RDW: 13.9 % (ref 11.5–15.5)
WBC: 12.9 K/uL — ABNORMAL HIGH (ref 4.0–10.5)
nRBC: 1.2 % — ABNORMAL HIGH (ref 0.0–0.2)

## 2024-10-14 LAB — COMPREHENSIVE METABOLIC PANEL WITH GFR
ALT: 21 U/L (ref 0–44)
AST: 37 U/L (ref 15–41)
Albumin: 2.2 g/dL — ABNORMAL LOW (ref 3.5–5.0)
Alkaline Phosphatase: 76 U/L (ref 38–126)
Anion gap: 10 (ref 5–15)
BUN: 56 mg/dL — ABNORMAL HIGH (ref 6–20)
CO2: 17 mmol/L — ABNORMAL LOW (ref 22–32)
Calcium: 5.8 mg/dL — CL (ref 8.9–10.3)
Chloride: 107 mmol/L (ref 98–111)
Creatinine, Ser: 1.31 mg/dL — ABNORMAL HIGH (ref 0.44–1.00)
GFR, Estimated: 47 mL/min — ABNORMAL LOW
Glucose, Bld: 102 mg/dL — ABNORMAL HIGH (ref 70–99)
Potassium: 2.7 mmol/L — CL (ref 3.5–5.1)
Sodium: 133 mmol/L — ABNORMAL LOW (ref 135–145)
Total Bilirubin: 0.7 mg/dL (ref 0.0–1.2)
Total Protein: 4.3 g/dL — ABNORMAL LOW (ref 6.5–8.1)

## 2024-10-14 LAB — GLUCOSE, CAPILLARY
Glucose-Capillary: 132 mg/dL — ABNORMAL HIGH (ref 70–99)
Glucose-Capillary: 168 mg/dL — ABNORMAL HIGH (ref 70–99)
Glucose-Capillary: 88 mg/dL (ref 70–99)
Glucose-Capillary: 153 mg/dL — ABNORMAL HIGH (ref 70–99)

## 2024-10-14 LAB — BPAM RBC
Blood Product Expiration Date: 202602032359
Unit Type and Rh: 6200

## 2024-10-14 LAB — TYPE AND SCREEN
ABO/RH(D): A POS
Antibody Screen: NEGATIVE
Unit division: 0

## 2024-10-14 LAB — PREPARE RBC (CROSSMATCH)

## 2024-10-14 LAB — MAGNESIUM: Magnesium: 1.7 mg/dL (ref 1.7–2.4)

## 2024-10-14 MED ORDER — IOHEXOL 350 MG/ML SOLN
75.0000 mL | Freq: Once | INTRAVENOUS | Status: AC | PRN
  Administered 2024-10-14: 75 mL via INTRAVENOUS

## 2024-10-14 MED ORDER — SODIUM CHLORIDE 0.9% IV SOLUTION: Freq: Once | INTRAVENOUS | Status: AC

## 2024-10-14 MED ORDER — SODIUM CHLORIDE 0.9 % IV SOLN
1.0000 g | INTRAVENOUS | Status: AC
  Administered 2024-10-14: 1 g via INTRAVENOUS
  Filled 2024-10-14: qty 10

## 2024-10-14 MED ORDER — POTASSIUM CHLORIDE CRYS ER 20 MEQ PO TBCR
40.0000 meq | EXTENDED_RELEASE_TABLET | ORAL | Status: AC
  Administered 2024-10-14 (×2): 40 meq via ORAL
  Filled 2024-10-14 (×2): qty 2

## 2024-10-14 MED ORDER — CALCIUM GLUCONATE-NACL 1-0.675 GM/50ML-% IV SOLN
1.0000 g | Freq: Once | INTRAVENOUS | Status: AC
  Administered 2024-10-14: 1000 mg via INTRAVENOUS
  Filled 2024-10-14: qty 50

## 2024-10-14 NOTE — Plan of Care (Signed)
 " Problem: Education: Goal: Ability to describe self-care measures that may prevent or decrease complications (Diabetes Survival Skills Education) will improve 10/14/2024 1947 by Robynn Avelina LABOR, RN Outcome: Progressing 10/14/2024 1947 by Robynn Avelina LABOR, RN Outcome: Progressing Goal: Individualized Educational Video(s) 10/14/2024 1947 by Robynn Avelina LABOR, RN Outcome: Progressing 10/14/2024 1947 by Robynn Avelina LABOR, RN Outcome: Progressing   Problem: Coping: Goal: Ability to adjust to condition or change in health will improve 10/14/2024 1947 by Robynn Avelina LABOR, RN Outcome: Progressing 10/14/2024 1947 by Robynn Avelina LABOR, RN Outcome: Progressing   Problem: Fluid Volume: Goal: Ability to maintain a balanced intake and output will improve 10/14/2024 1947 by Robynn Avelina LABOR, RN Outcome: Progressing 10/14/2024 1947 by Robynn Avelina LABOR, RN Outcome: Progressing   Problem: Health Behavior/Discharge Planning: Goal: Ability to identify and utilize available resources and services will improve 10/14/2024 1947 by Robynn Avelina LABOR, RN Outcome: Progressing 10/14/2024 1947 by Robynn Avelina LABOR, RN Outcome: Progressing Goal: Ability to manage health-related needs will improve 10/14/2024 1947 by Robynn Avelina LABOR, RN Outcome: Progressing 10/14/2024 1947 by Robynn Avelina LABOR, RN Outcome: Progressing   Problem: Metabolic: Goal: Ability to maintain appropriate glucose levels will improve 10/14/2024 1947 by Robynn Avelina LABOR, RN Outcome: Progressing 10/14/2024 1947 by Robynn Avelina LABOR, RN Outcome: Progressing   Problem: Nutritional: Goal: Maintenance of adequate nutrition will improve 10/14/2024 1947 by Robynn Avelina LABOR, RN Outcome: Progressing 10/14/2024 1947 by Robynn Avelina LABOR, RN Outcome: Progressing Goal: Progress toward achieving an optimal weight will improve 10/14/2024 1947 by Robynn Avelina LABOR, RN Outcome:  Progressing 10/14/2024 1947 by Robynn Avelina LABOR, RN Outcome: Progressing   Problem: Skin Integrity: Goal: Risk for impaired skin integrity will decrease 10/14/2024 1947 by Robynn Avelina LABOR, RN Outcome: Progressing 10/14/2024 1947 by Robynn Avelina LABOR, RN Outcome: Progressing   Problem: Tissue Perfusion: Goal: Adequacy of tissue perfusion will improve 10/14/2024 1947 by Robynn Avelina LABOR, RN Outcome: Progressing 10/14/2024 1947 by Robynn Avelina LABOR, RN Outcome: Progressing   Problem: Education: Goal: Knowledge of General Education information will improve Description: Including pain rating scale, medication(s)/side effects and non-pharmacologic comfort measures 10/14/2024 1947 by Robynn Avelina LABOR, RN Outcome: Progressing 10/14/2024 1947 by Robynn Avelina LABOR, RN Outcome: Progressing   Problem: Health Behavior/Discharge Planning: Goal: Ability to manage health-related needs will improve 10/14/2024 1947 by Robynn Avelina LABOR, RN Outcome: Progressing 10/14/2024 1947 by Robynn Avelina LABOR, RN Outcome: Progressing   Problem: Clinical Measurements: Goal: Ability to maintain clinical measurements within normal limits will improve 10/14/2024 1947 by Robynn Avelina LABOR, RN Outcome: Progressing 10/14/2024 1947 by Robynn Avelina LABOR, RN Outcome: Progressing Goal: Will remain free from infection 10/14/2024 1947 by Robynn Avelina LABOR, RN Outcome: Progressing 10/14/2024 1947 by Robynn Avelina LABOR, RN Outcome: Progressing Goal: Diagnostic test results will improve 10/14/2024 1947 by Robynn Avelina LABOR, RN Outcome: Progressing 10/14/2024 1947 by Robynn Avelina LABOR, RN Outcome: Progressing Goal: Respiratory complications will improve 10/14/2024 1947 by Robynn Avelina LABOR, RN Outcome: Progressing 10/14/2024 1947 by Robynn Avelina LABOR, RN Outcome: Progressing Goal: Cardiovascular complication will be avoided 10/14/2024 1947 by Robynn Avelina LABOR,  RN Outcome: Progressing 10/14/2024 1947 by Robynn Avelina LABOR, RN Outcome: Progressing   Problem: Activity: Goal: Risk for activity intolerance will decrease 10/14/2024 1947 by Robynn Avelina LABOR, RN Outcome: Progressing 10/14/2024 1947 by Robynn Avelina LABOR, RN Outcome: Progressing   Problem: Nutrition: Goal: Adequate nutrition will be maintained 10/14/2024 1947 by Robynn Avelina LABOR, RN Outcome: Progressing 10/14/2024 1947 by Robynn Avelina LABOR, RN Outcome: Progressing  Problem: Coping: Goal: Level of anxiety will decrease 10/14/2024 1947 by Robynn Avelina LABOR, RN Outcome: Progressing 10/14/2024 1947 by Robynn Avelina LABOR, RN Outcome: Progressing   Problem: Elimination: Goal: Will not experience complications related to bowel motility 10/14/2024 1947 by Robynn Avelina LABOR, RN Outcome: Progressing 10/14/2024 1947 by Robynn Avelina LABOR, RN Outcome: Progressing Goal: Will not experience complications related to urinary retention 10/14/2024 1947 by Robynn Avelina LABOR, RN Outcome: Progressing 10/14/2024 1947 by Robynn Avelina LABOR, RN Outcome: Progressing   Problem: Pain Managment: Goal: General experience of comfort will improve and/or be controlled 10/14/2024 1947 by Robynn Avelina LABOR, RN Outcome: Progressing 10/14/2024 1947 by Robynn Avelina LABOR, RN Outcome: Progressing   Problem: Safety: Goal: Ability to remain free from injury will improve 10/14/2024 1947 by Robynn Avelina LABOR, RN Outcome: Progressing 10/14/2024 1947 by Robynn Avelina LABOR, RN Outcome: Progressing   Problem: Skin Integrity: Goal: Risk for impaired skin integrity will decrease 10/14/2024 1947 by Robynn Avelina LABOR, RN Outcome: Progressing 10/14/2024 1947 by Robynn Avelina LABOR, RN Outcome: Progressing   "

## 2024-10-14 NOTE — Progress Notes (Signed)
 Physical Therapy Treatment Patient Details Name: Susan [name] MRN: [id number] DOB: [birth year] Today's Date: 10/14/2024   History of Present Illness Pt is a 59 y.o. female who presented 10/09/24 due to multiple episodes of bloody emesis. CT showed a nodular liver compatible with cirrhosis. Pt was admitted to ICU 1/17-19.  Pt s/p TIPS and coil embolization of posterior and L gastric veins 10/10/24. 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 2. Small volume pelvic and perihepatic ascites, new since previous. 3. Streak artifact from multiple embolization coils limits evaluation of left upper quadrant varices; some visible varices near the GE junction remain  patent. Pending blood transfusion 1/21 at time of PT session. PMH: autoimmune liver cirrhosis, hepatic encephalopathy, esophageal varices, primary biliary cholangitis, HTN, HLD, obesity.    PT Comments  Pt received in supine, c/o fatigue/mild dizziness recently with upright posture, of note pt still NPO and pending blood transfusion. Pt recently returned from CT abdomen and quick to fatigue, but agreeable to limited therapy session at bedside with emphasis on activity pacing, close vital signs assessment, and transfer from bed>BSC. Pt spouse in room and supportive and agreeable to assist her off BSC once she is done, pt with call bell in reach and encouraged to notify staff once done on [facility] if symptoms worsen or pt needing +2 for safety when pivoting back to bed from [facility]. Pt quick to fatigue after STS trials/pre-gait marching activity, so not able to progress gait this session. Pt continues to benefit from PT services to progress toward functional mobility goals, pt pending possible procedure per IR following date, continue to recommend HHPT upon DC pending progress post-procedure. Pt may benefit from trial of  BLE compression socks next session if she remains dizzy with standing activity, esp if NPO status is extended past this date. Orthostatic Lying   BP- Lying 104/56 (MAP (70)  Pulse- Lying 65  Orthostatic Sitting  BP- Sitting 106/62 (MAP (76)  Pulse- Sitting 67  Orthostatic Standing at 0 minutes  BP- Standing at 0 minutes 91/44 (MAP (58)  Pulse- Standing at 0 minutes 68  Orthostatic Standing at 3 minutes  BP- Standing at 3 minutes 102/49 (after standing marches at bedside; MAP (66))  Pulse- Standing at 3 minutes 68       If plan is discharge home, recommend the following: A little help with walking and/or transfers;A little help with bathing/dressing/bathroom;Assistance with cooking/housework;Assist for transportation;Help with stairs or ramp for entrance;Supervision due to cognitive status   Can travel by private vehicle        Equipment Recommendations  Rolling walker (2 wheels);BSC/3in1;Other (comment) (pending progress; may need youth height bari RW for hip width)    Recommendations for Other Services       Precautions / Restrictions Precautions Precautions: Fall Recall of Precautions/Restrictions: Intact Precaution/Restrictions Comments: watch BP, symptomatic drop in MAP 1/21 Restrictions Weight Bearing Restrictions Per Provider Order: No     Mobility  Bed Mobility Overal bed mobility: Needs Assistance Bed Mobility: Supine to Sit     Supine to sit: Supervision, Used rails     General bed mobility comments: from flat bed to R EOB, pt using bed rail PRN.    Transfers Overall transfer level: Needs assistance Equipment used: 1 person hand held assist Transfers: Sit to/from Stand, Bed to chair/wheelchair/BSC Sit to Stand: Contact guard assist Stand pivot transfers: Contact guard assist  General transfer comment: from EOB with pt using R side rail to push up, CGA for HHA for safety as pt c/o dizziness but does not need significant steadying assist.  After standing exercises and seated rest break, pt requesting to get on BSC, she requests to step pivot with BSC at bedside rather than walking to bathroom due to increased fatigue/dizziness. Note pt NPO and pending blood transfusion, this is appropriate.    Ambulation/Gait               General Gait Details: Defer as pt symptomatic of anemia and BP MAP dropped when standing, and pt had need to sit on [facility] at end of session.   Stairs             Wheelchair Mobility     Tilt Bed    Modified Rankin (Stroke Patients Only)       Balance Overall balance assessment: Needs assistance Sitting-balance support: Feet supported Sitting balance-Leahy Scale: Good     Standing balance support: Single extremity supported Standing balance-Leahy Scale: Fair Standing balance comment: standing with CGA while BP checked, PRN bed rail use for support due to dizziness                            Communication Communication Communication: No apparent difficulties  Cognition Arousal: Alert Behavior During Therapy: WFL for tasks assessed/performed   PT - Cognitive impairments: No apparent impairments                       PT - Cognition Comments: Improved apparent alertness this date compared with previous session. Appears A&O but not assessed in detail. Following commands: Intact      Cueing Cueing Techniques: Verbal cues  Exercises Other Exercises Other Exercises: standing BLE AROM: hip flexion x10 reps ea x2 sets    General Comments General comments (skin integrity, edema, etc.): SpO2 WFL on RA; HR WFL, BP MAP drop from (70) supine to (58) upon standing, pt also c/o increased edema in BUE and BLE; discussed potentially trialing BLE TED hose for hemodynamic stability and to help with BLE edema, pt seems agreeable; note NPO status and pt pending blood transfusion still, so symptoms may improve next session if pt better hydrated.      Pertinent Vitals/Pain  Pain Assessment Pain Assessment: Faces Faces Pain Scale: Hurts a little bit Pain Location: not localized Pain Descriptors / Indicators: Guarding Pain Intervention(s): Limited activity within patient's tolerance, Monitored during session, Repositioned    Home Living                          Prior Function            PT Goals (current goals can now be found in the care plan section) Acute Rehab PT Goals Patient Stated Goal: To feel less dizzy so I can walk more and to be able to go back to work. PT Goal Formulation: With patient Time For Goal Achievement: 10/27/24 Potential to Achieve Goals: Good Progress towards PT goals: Progressing toward goals    Frequency    Min 2X/week      PT Plan      Co-evaluation              AM-PAC PT 6 Clicks Mobility   Outcome Measure  Help needed turning from your back to your side while in a flat bed without  using bedrails?: None Help needed moving from lying on your back to sitting on the side of a flat bed without using bedrails?: A Little Help needed moving to and from a bed to a chair (including a wheelchair)?: A Little Help needed standing up from a chair using your arms (e.g., wheelchair or bedside chair)?: A Little Help needed to walk in hospital room?: A Lot (due to dizziness anticipate +2 needed-safety) Help needed climbing 3-5 steps with a railing? : Total 6 Click Score: 16    End of Session Equipment Utilized During Treatment: Other (comment) (defer gait belt as pt did not ambulate, and CGA only needed for step pivot from bed<>BSC) Activity Tolerance: Patient limited by fatigue;Treatment limited secondary to medical complications (Comment);Other (comment) (symptomatic anemia/drop in BP standing) Patient left: with call bell/phone within reach;with family/visitor present;Other (comment) (pt on [facility], spouse in room and agreeable to assist her to pivot back to bed from [facility], he has been assisting her throughout the  day as needed; pt aware she can press call bell if symptoms increase for +2 assist for safety or for nursing assist.) Nurse Communication: Mobility status;Other (comment) (symptoms of dizziness standing) PT Visit Diagnosis: Difficulty in walking, not elsewhere classified (R26.2);Unsteadiness on feet (R26.81)     Time: 8374-8356 PT Time Calculation (min) (ACUTE ONLY): 18 min  Charges:    $Therapeutic Activity: 8-22 mins PT General Charges $$ ACUTE PT VISIT: 1 Visit                     Eriel Dunckel P., PTA Acute Rehabilitation Services Secure Chat Preferred 9a-5:30pm Office: [phone]    Connell HER[facility] 10/14/2024, 5:01 PM

## 2024-10-14 NOTE — Progress Notes (Signed)
 Date and time results received: 10/14/24 733 (use smartphrase .now to insert current time)  Test: potassium Critical Value: 2.7  Test: Calcium  Critical Value: 5.8  Name of Provider Notified: Sophie Mao, MD  Orders Received? Or Actions Taken?: No new orders

## 2024-10-14 NOTE — Progress Notes (Signed)
 Critical value received informed Charlanne Groom MD and informed day shift RN Avelina.

## 2024-10-14 NOTE — Progress Notes (Cosign Needed Addendum)
 "   Referring Physician(s): Pyrtle, Gordy   Supervising Physician: Philip Cornet  Patient Status:  [facility] - In-pt  Chief Complaint:  PBC/AIH cirrhosis with UGIB due to gastric varices S/p TIPS, coil embolization of posterior and left gastric veins, and BRTO by Dr. Jennefer on 10/10/24.    Subjective:  Patient found to have drop in hgb this morning from 8.0 to 6.6. A repeat CT BRTO protocol was ordered, unfortunately showing occlusive thrombosis of the main portal vein and TIPS shunt. Today patient states she is otherwise feeling better than yesterday, but still with generalized fatigue and generalized abdominal aching. Spoke with IR attending Dr. Hughes, with recommendations for TIPS revision tomorrow with moderate sedation. Spoke to patient and husband about this at bedside, and they are agreeable to proceed. All questions answered.   Allergies: Mushroom extract complex (obsolete), Grapefruit concentrate, Mounjaro  [tirzepatide ], Sulfa antibiotics, Amlodipine, Crestor  [rosuvastatin ], Shellfish allergy, Simvastatin , Strawberry extract, and Metformin  and related  Medications: Prior to Admission medications  Medication Sig Start Date End Date Taking? Authorizing Provider  carvedilol  (COREG ) 3.125 MG tablet Take 3.125 mg by mouth 2 (two) times daily.   Yes [provider]  hydrochlorothiazide  (HYDRODIURIL ) 25 MG tablet TAKE 1 TABLET BY MOUTH DAILY 01/07/24  Yes Dugal, Tabitha, FNP  lactulose  (CHRONULAC ) 10 GM/15ML solution Take 45 mLs (30 g total) by mouth 2 (two) times daily. 08/29/24  Yes Mdala-Gausi, Masiku Agatha, MD  spironolactone  (ALDACTONE ) 50 MG tablet Take 1 tablet (50 mg total) by mouth daily. 09/07/24  Yes Dugal, Ginger, FNP  ursodiol  (ACTIGALL ) 300 MG capsule TAKE 2 CAPSULES BY MOUTH TWICE  DAILY 10/22/23  Yes Armbruster, Elspeth SQUIBB, MD     Vital Signs: BP 116/70   Pulse 74   Temp 98.2 F (36.8 C) (Oral)   Resp 18   Ht 5' (1.524 m)   Wt 205 lb 11 oz (93.3 kg)   SpO2 100%    BMI 40.17 kg/m   Physical Exam Vitals reviewed.  Constitutional:      Comments: Drowsy, but alert and can open eyes and answer questions appropriately  HENT:     Head: Normocephalic and atraumatic.  Neck:     Comments: + puncture site in RIJ, no dressing. Appears dry and clean, almost fully healed.  Cardiovascular:     Rate and Rhythm: Normal rate.  Pulmonary:     Effort: Pulmonary effort is normal.  Abdominal:     General: Abdomen is flat.     Palpations: Abdomen is soft.     Comments: Generalized aching but no focal ttp  Genitourinary:    Comments: Old, bloody BM noted. Skin:    General: Skin is warm and dry.     Coloration: Skin is not jaundiced or pale.     Imaging: CT ANGIO ABD/PELVIS BRTO Result Date: 10/14/2024 EXAM: CTA ABDOMEN AND PELVIS WITH CONTRAST 10/14/2024 03:49:00 PM TECHNIQUE: CTA images of the abdomen and pelvis with intravenous contrast. Three-dimensional MIP/volume rendered formations were performed. Automated exposure control, iterative reconstruction, and/or weight based adjustment of the mA/kV was utilized to reduce the radiation dose to as low as reasonably achievable. COMPARISON: 1 17 26  CLINICAL HISTORY: s/p TIPS/BRTO, drop in hgb FINDINGS: Blood pool is hypodense compared to the interventricular septum suggesting anemia. VASCULATURE: AORTA: No acute finding. No abdominal aortic aneurysm. No dissection. CELIAC TRUNK: No acute finding. No occlusion or significant stenosis. SUPERIOR MESENTERIC ARTERY: No acute finding. No occlusion or significant stenosis. RENAL ARTERIES: No acute finding. No occlusion or significant  stenosis. INFERIOR MESENTERIC ARTERY: Focal calcified plaque of the origin of the inferior mesenteric artery which is patent distally. ILIAC ARTERIES: Mild scattered calcified plaque in bilateral internal iliac arteries. No occlusion or significant stenosis. 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 SUPERIOR MESENTERIC VEIN:  Superior mesenteric vein patent. ILIAC VEINS, IVC, RENAL VEINS: Patent iliac venous system, IVC, and bilateral renal veins. TIPS STENT: No definite contrast enhancement through the TIPS stent. VARICES: Streak artifact from multiple embolization coils degrades evaluation of left upper quadrant varices. However, some visible varices near the GE junction remain patent. LIVER: Nodular hepatic contour. GALLBLADDER AND BILE DUCTS: Gallbladder is unremarkable. No biliary ductal dilatation. SPLEEN: The spleen is unremarkable. PANCREAS: The pancreas is unremarkable. ADRENAL GLANDS: Bilateral adrenal glands demonstrate no acute abnormality. KIDNEYS, URETERS AND BLADDER: No stones in the kidneys or ureters. No hydronephrosis. No perinephric or periureteral stranding. Urinary bladder is unremarkable. GI AND BOWEL: Stomach and duodenal sweep demonstrate no acute abnormality. There is no bowel obstruction. No abnormal bowel wall thickening or distension. REPRODUCTIVE: Reproductive organs are unremarkable. PERITONEUM AND RETROPERITONEUM: Small volume pelvic and perihepatic ascites, new since previous. No free air. LUNG BASE: No acute abnormality. LYMPH NODES: No lymphadenopathy. BONES AND SOFT TISSUES: Early degenerative disc disease L3-L4. No acute abnormality of the bones. No acute soft tissue abnormality. IMPRESSION: 1. 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 2. Small volume pelvic and perihepatic ascites, new since previous. 3. Streak artifact from multiple embolization coils limits evaluation of left upper quadrant varices; some visible varices near the GE junction remain patent. 4. Hypodense blood pool compared to the interventricular septum, suggesting anemia. Electronically signed by: Dayne Hassell MD 10/14/2024 04:24 PM EST RP Workstation: [id number]   DG CHEST PORT 1 VIEW Result Date: 10/11/2024 EXAM: 1 VIEW(S) XRAY OF  THE CHEST 10/11/2024 07:46:00 AM COMPARISON: 08/28/2024 CLINICAL HISTORY: Hypoxia. FINDINGS: LINES, TUBES AND DEVICES: Endotracheal tube in place with tip 3.1 cm above the carina. LUNGS AND PLEURA: Mildly decreased lung volumes. Mild left basilar linear subsegmental atelectasis. No pleural effusion. No pneumothorax. HEART AND MEDIASTINUM: No acute abnormality of the cardiac and mediastinal silhouettes. BONES AND SOFT TISSUES: No acute osseous abnormality. Left upper quadrant embolization coils noted. IMPRESSION: 1. Endotracheal tube tip 3.1 cm above the carina. 2. Mild left basilar linear subsegmental atelectasis. 3. Left upper quadrant embolization coils. Electronically signed by: Evalene Coho MD 10/11/2024 07:50 AM EST RP Workstation: [id number]    Labs:  CBC: Recent Labs    10/12/24 0557 10/12/24 2019 10/13/24 0458 10/14/24 0526  WBC 19.4* 18.7* 15.4* 12.9*  HGB 8.2* 7.9* 8.0* 6.6*  HCT 23.9* 23.0* 23.4* 19.5*  PLT 114* 114* 117* 123*    COAGS: Recent Labs    08/29/24 0553 09/03/24 1419 10/10/24 0040 10/11/24 0905  INR 1.3* 1.4* 1.1 1.3*  APTT 35  --  24  --     BMP: Recent Labs    10/11/24 0905 10/12/24 0557 10/13/24 1127 10/14/24 0526  NA 137 136 133* 133*  K 4.7 4.0 3.7 2.7*  CL 106 106 102 107  CO2 19* 20* 23 17*  GLUCOSE 230* 172* 152* 102*  BUN 62* 79* 78* 56*  CALCIUM  8.0* 7.8* 7.8* 5.8*  CREATININE 2.30* 2.65* 2.13* 1.31*  GFRNONAA 24* 20* 26* 47*    LIVER FUNCTION TESTS: Recent Labs    09/03/24 1419 10/09/24 2348 10/11/24 0905 10/14/24 0526  BILITOT 1.3* 1.6* 0.6  0.7  AST 33 40 64* 37  ALT 19 20 31 21   ALKPHOS 75 124 94 76  PROT 7.0 6.5 5.6* 4.3*  ALBUMIN 3.4* 3.0* 2.7* 2.2*    Assessment and Plan:  59 y.o. female with PBC/AIH cirrhosis, UGIB secondary to gastric varices, TIPS, coil embolization of posterior and left gastric veins, and BRTO by Dr. Jennefer on 10/10/24.   VSS today but hgb dropped from 8.0 to 6.6 overnight. Pt pending  blood transfusion this evening. K 2.7, Ca 5.8, RF improving, LFT wnl.  Lactulose  dose decreased due to frequent BM Still has upper abdominal pain, on IV dilaudid  0.5 mg q4h PRN   CT BRTO protocol ordered this AM which has been delayed due to lack of appropriate IV.  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  IR plan for TIPS revision with moderate sedation tomorrow, 10/15/24 with Dr. Hughes - Labs ordered including new PT/INR and ammonia, CBC, CMP - Pt to be NPO at midnight - Pt not currently on anticoagulation given drop in hgb  Risks and benefits of TIPS revision were discussed with the patient and/or the patient's family including, but not limited to, infection, bleeding, damage to adjacent structures, worsening hepatic and/or cardiac function, worsening and/or the development of altered mental status/encephalopathy, non-target embolization and death.   This interventional procedure involves the use of X-rays and because of the nature of the planned procedure, it is possible that we will have prolonged use of X-ray fluoroscopy.  Potential radiation risks to you include (but are not limited to) the following: - A slightly elevated risk for cancer  several years later in life. This risk is typically less than 0.5% percent. This risk is low in comparison to the normal incidence of human cancer, which is 33% for women and 50% for men according to the [employer] Society. - Radiation induced injury can include skin redness, resembling a rash, tissue breakdown / ulcers and hair loss (which can be temporary or permanent).   The likelihood of either of these occurring depends on the difficulty of the procedure and whether you are sensitive to radiation due to previous procedures, disease, or genetic conditions.   IF your procedure requires a prolonged use of radiation, you will be notified and given written instructions for further action.  It is  your responsibility to monitor the irradiated area for the 2 weeks following the procedure and to notify your physician if you are concerned that you have suffered a radiation induced injury.    All of the patient's questions were answered, patient is agreeable to proceed.  Consent signed and in chart.     Electronically Signed: Kimble VEAR Clas, PA-C 10/14/2024, 5:30 PM   I spent a total of 15 Minutes at the the patient's bedside AND on the patient's hospital floor or unit, greater than 50% of which was counseling/coordinating care for TIPS/BRTO/gastric varices embo follow up.   This chart was dictated using voice recognition software.  Despite best efforts to proofread,  errors can occur which can change the documentation meaning.   "

## 2024-10-14 NOTE — Progress Notes (Signed)
 " PROGRESS NOTE    Susan [name]  FMW:[id number] DOB: May 12, 1966 DOA: 10/09/2024 PCP: Corwin Antu, FNP   Brief Narrative:  59 year old female with history of alcoholic cirrhosis, esophageal varices, primary biliary cholangitis, hepatic encephalopathy, hypertension, hyperlipidemia, obesity and hospitalization in December 2025 for hepatic encephalopathy presented with hematemesis.  She was initially admitted to ICU and intubated for airway protection she was transfused packed red cells.  She underwent EGD on 10/10/2024 followed by TIPS.  She was extubated on 10/11/2024.  Care has been transferred to [facility] service from 10/13/2024  Assessment & Plan:   Hemorrhagic shock Upper GI bleeding from variceal bleeding Acute blood loss anemia - Has received 1 unit packed red cells during this hospitalization.  Hemoglobin 6.6 this morning.  Transfuse 1 unit packed red cells.  Monitor H&H -underwent EGD on 10/10/2024: Large amount of clotted blood found in the stomach along with type I gastric varices -s/p TIPS, coil embolization of posterior and left gastric veins, and coil assisted retrograde transvenous obliteration of gastrorenal shunt; procedure performed 10/10/2024  -GI following: Currently IV Protonix .  Off octreotide  drip.  Completed 5-day course of Rocephin . -Diet advancement as per GI  Alcoholic cirrhosis Hepatic encephalopathy - GI following.  Continue rifaximin  and lactulose  monitor mental status  AKI - Possibly prerenal.  Creatinine improving to 1.31 this morning.  Monitor  Hypokalemia -Replace.  Repeat a.m. labs  Hypocalcemia -Replace.  Repeat a.m. labs  Hyponatremia - Mild.  Monitor  Leukocytosis - Improving.  Monitor  Thrombocytopenia - Monitor  Acute metabolic acidosis - Monitor  Acute hypoxic respiratory failure -Status post intubation on admission and extubated on 10/11/2024.  Currently on room air  Diabetes mellitus type 2 -Continue CBGs with SSI  Obesity class  III - Outpatient follow-up  Physical deconditioning - Will need home health PT/OT    DVT prophylaxis: SCDs.  Heparin  subcutaneous Code Status: Full Family Communication: Husband at bedside Disposition Plan: Status is: Inpatient Remains inpatient appropriate because: Of severity of illness    Consultants: GI/IR  Procedures: As above  Antimicrobials: Rocephin  from 10/10/2024 onwards   Subjective: Patient seen and examined at bedside.  Feels weak and tired.  No fever or vomiting reported.  No vomiting or black or bloody stools reported.  Objective: Vitals:   10/13/24 1014 10/13/24 1700 10/13/24 2141 10/14/24 0508  BP: (!) 104/58 115/78 (!) 107/51 117/62  Pulse: 70 67 74 73  Resp: 18 18 20 16   Temp: 98.2 F (36.8 C) 98.1 F (36.7 C)  98.3 F (36.8 C)  TempSrc: Oral   Oral  SpO2: 99% 99% 96% 100%  Weight:      Height:        Intake/Output Summary (Last 24 hours) at 10/14/2024 0819 Last data filed at 10/14/2024 0604 Gross per 24 hour  Intake 1636.82 ml  Output --  Net 1636.82 ml   Filed Weights   10/11/24 0706 10/12/24 0500 10/13/24 0500  Weight: 85.5 kg 86.4 kg 93.3 kg    Examination:  General exam: Appears calm and comfortable  Respiratory system: Bilateral decreased breath sounds at bases Cardiovascular system: S1 & S2 heard, Rate controlled Gastrointestinal system: Abdomen is morbidly obese, nondistended, soft and nontender. Normal bowel sounds heard. Extremities: No cyanosis, clubbing, edema  Central nervous system: Alert and oriented.  Slow to respond.  No focal neurological deficits. Moving extremities Skin: No rashes, lesions or ulcers Psychiatry: Flat affect.  Not agitated.   Data Reviewed: I have personally reviewed following labs and imaging studies  CBC: Recent Labs  Lab 10/11/24 2209 10/12/24 0557 10/12/24 2019 10/13/24 0458 10/14/24 0526  WBC 16.9* 19.4* 18.7* 15.4* 12.9*  NEUTROABS 11.6* 13.3* 12.2* 10.4* 7.6  HGB 8.0* 8.2* 7.9*  8.0* 6.6*  HCT 22.9* 23.9* 23.0* 23.4* 19.5*  MCV 90.9 91.6 93.5 92.5 94.7  PLT 97* 114* 114* 117* 123*   Basic Metabolic Panel: Recent Labs  Lab 10/10/24 1949 10/11/24 0905 10/12/24 0551 10/12/24 0557 10/13/24 1127 10/14/24 0526  NA 136 137  --  136 133* 133*  K 4.9 4.7  --  4.0 3.7 2.7*  CL 106 106  --  106 102 107  CO2 20* 19*  --  20* 23 17*  GLUCOSE 223* 230*  --  172* 152* 102*  BUN 48* 62*  --  79* 78* 56*  CREATININE 1.41* 2.30*  --  2.65* 2.13* 1.31*  CALCIUM  8.1* 8.0*  --  7.8* 7.8* 5.8*  MG  --   --  1.7  --   --  1.7  PHOS  --   --  4.2  --   --   --    GFR: Estimated Creatinine Clearance: 47.7 mL/min (A) (by C-G formula based on SCr of 1.31 mg/dL (H)). Liver Function Tests: Recent Labs  Lab 10/09/24 2348 10/11/24 0905 10/14/24 0526  AST 40 64* 37  ALT 20 31 21   ALKPHOS 124 94 76  BILITOT 1.6* 0.6 0.7  PROT 6.5 5.6* 4.3*  ALBUMIN 3.0* 2.7* 2.2*   Recent Labs  Lab 10/09/24 2348  LIPASE 84*   Recent Labs  Lab 10/10/24 0754 10/12/24 0557  AMMONIA 113* 42*   Coagulation Profile: Recent Labs  Lab 10/10/24 0040 10/11/24 0905  INR 1.1 1.3*   Cardiac Enzymes: No results for input(s): CKTOTAL, CKMB, CKMBINDEX, TROPONINI in the last 168 hours. BNP (last 3 results) No results for input(s): PROBNP in the last 8760 hours. HbA1C: No results for input(s): HGBA1C in the last 72 hours. CBG: Recent Labs  Lab 10/12/24 2146 10/13/24 0801 10/13/24 1133 10/13/24 1841 10/13/24 2139  GLUCAP 142* 162* 153* 159* 160*   Lipid Profile: Recent Labs    10/11/24 0832  TRIG 273*   Thyroid  Function Tests: No results for input(s): TSH, T4TOTAL, FREET4, T3FREE, THYROIDAB in the last 72 hours. Anemia Panel: No results for input(s): VITAMINB12, FOLATE, FERRITIN, TIBC, IRON, RETICCTPCT in the last 72 hours. Sepsis Labs: No results for input(s): PROCALCITON, LATICACIDVEN in the last 168 hours.  Recent Results (from the  past 240 hours)  MRSA Next Gen by PCR, Nasal     Status: None   Collection Time: 10/10/24  4:17 AM   Specimen: Nasal Mucosa; Nasal Swab  Result Value Ref Range Status   MRSA by PCR Next Gen NOT DETECTED NOT DETECTED Final    Comment: (NOTE) The GeneXpert MRSA Assay (FDA approved for NASAL specimens only), is one component of a comprehensive MRSA colonization surveillance program. It is not intended to diagnose MRSA infection nor to guide or monitor treatment for MRSA infections. Test performance is not FDA approved in patients less than 75 years old. Performed at [facility], [address], [place], [place] [address]          Radiology Studies: No results found.      Scheduled Meds:  sodium chloride    Intravenous Once   sodium chloride    Intravenous Once   carvedilol   3.125 mg Oral BID WC   Chlorhexidine  Gluconate Cloth  6 each Topical  Daily   heparin  injection (subcutaneous)  5,000 Units Subcutaneous Q8H   insulin  aspart  0-15 Units Subcutaneous TID with meals   lactulose   10 g Oral BID   pantoprazole  (PROTONIX ) IV  40 mg Intravenous Q24H   potassium chloride   40 mEq Oral Q4H   rifaximin   550 mg Oral BID   zinc  sulfate (50mg  elemental zinc )  220 mg Oral BID   Continuous Infusions:  calcium  gluconate            Sophie Mao, MD [facility] 10/14/2024, 8:19 AM   "

## 2024-10-14 NOTE — Progress Notes (Signed)
 "   Inpatient Progress Note     Patient Profile/Chief Complaint  59 year old female with a past medical history of autoimmune liver cirrhosis, hepatic encephalopathy, gastric varices, primary biliary cholangitis, hypertension, hyperlipidemia, obesity who presented to the hospital with multiple episodes of bloody emesis secondary to bleeding gastric varix.  EGD 10/10/2024 - no esophageal varices, clotted blood in the cardia, gastric fundus and in the gastric body, large amount of type I isolated gastric varix in the cardia and fundus with no active bleeding but did have stigmata of recent bleeding and hematoma, medium in size  CTAP with new finding of spontaneous splenorenal shunt   10/10/2024  status post TIPS, coil embolization of posterior left gastric veins and coil assisted retrograde transvenous obliteration of gastrorenal shunt  Transferred from ICU to floor 10/12/2024.   Interval History   -- Abdominal pain is improving -- Appetite remains low but she is eating more food -- 4 bowel movements yesterday -lactulose  was decreased due to increased bowel frequency previously -- Hemoglobin declined to 6.6 -ordered for 1 unit PRBC -no hematemesis -- Labs today: Na 133, K 2.7, BUN 56, Cr 1.31, Ca 5.8, Alb 2.2  MELD 3.0: 22 at 10/13/2024 11:27 AM MELD-Na: 19 at 10/13/2024 11:27 AM Calculated from: Serum Creatinine: 2.13 mg/dL at 8/79/7973 88:72 AM Serum Sodium: 133 mmol/L at 10/13/2024 11:27 AM Total Bilirubin: 0.6 mg/dL (Using min of 1 mg/dL) at 8/81/7973  0:94 AM Serum Albumin: 2.7 g/dL at 8/81/7973  0:94 AM INR(ratio): 1.3 at 10/11/2024  9:05 AM Age at listing (hypothetical): 59 years Sex: Female at 10/13/2024 11:27 AM      Objective   Vital signs in last 24 hours: Temp:  [98.1 F (36.7 C)-98.3 F (36.8 C)] 98.2 F (36.8 C) (01/21 1135) Pulse Rate:  [67-74] 74 (01/21 1135) Resp:  [16-20] 18 (01/21 1135) BP: (107-117)/(51-78) 116/70 (01/21 1135) SpO2:  [96 %-100 %] 100 % (01/21  1135) Last BM Date : 10/13/24 General:    Resting in chair in no distress, more interactive in conversation today Heart:  Regular rate and rhythm; no murmurs Lungs: Respirations even and unlabored, lungs CTA bilaterally Abdomen:  Soft, nontender and nondistended. Normal bowel sounds. Extremities:  Without edema. Neurologic: Sleepy but more interactive in conversation today   Intake/Output from previous day: 01/20 0701 - 01/21 0700 In: 1636.8 [P.O.:360; I.V.:1276.8] Out: -  Intake/Output this shift: Total I/O In: 10 [I.V.:10] Out: -   Lab Results: Recent Labs    10/12/24 2019 10/13/24 0458 10/14/24 0526  WBC 18.7* 15.4* 12.9*  HGB 7.9* 8.0* 6.6*  HCT 23.0* 23.4* 19.5*  PLT 114* 117* 123*   BMET Recent Labs    10/12/24 0557 10/13/24 1127 10/14/24 0526  NA 136 133* 133*  K 4.0 3.7 2.7*  CL 106 102 107  CO2 20* 23 17*  GLUCOSE 172* 152* 102*  BUN 79* 78* 56*  CREATININE 2.65* 2.13* 1.31*  CALCIUM  7.8* 7.8* 5.8*   LFT Recent Labs    10/14/24 0526  PROT 4.3*  ALBUMIN 2.2*  AST 37  ALT 21  ALKPHOS 76  BILITOT 0.7   PT/INR No results for input(s): LABPROT, INR in the last 72 hours.  Ammonia 42  Studies/Results: No results found.   Endoscopic Studies: EGD 10/10/2024 - no esophageal varices, clotted blood in the cardia, gastric fundus and in the gastric body, large amount of type I isolated gastric varix in the cardia and fundus with no active bleeding but did have stigmata of recent  bleeding and hematoma, medium in size     Clinical Impression   59 year old female with a past medical history of autoimmune liver cirrhosis, hepatic encephalopathy, gastric varices, primary biliary cholangitis, hypertension, hyperlipidemia, obesity who presented to the hospital with multiple episodes of bloody emesis.   EGD 10/10/2024 - no esophageal varices, clotted blood in the cardia, gastric fundus and in the gastric body, large amount of type I isolated gastric varix  in the cardia and fundus with no active bleeding but did have stigmata of recent bleeding and hematoma, medium in size  CTAP with new finding of spontaneous splenorenal shunt   10/10/2024  status post TIPS, coil embolization of posterior left gastric veins and coil assisted retrograde transvenous obliteration of gastrorenal shunt  Susan [name] has not shown evidence of overt GI bleeding since TIPS/BRTO.  Overnight hemoglobin declined 8.0--> 6.6.  No hematemesis or coffee-ground emesis.  She is receiving 1 unit PRBC.  This raises concern for potential evolving recurrent GI bleeding and have recommended reengaging IR as her bleeding has not been amenable to endoscopic intervention.  She has been somnolent and manifesting symptoms of hepatic encephalopathy likely exacerbated status post TIPS.  Ammonia has decreased with lactulose , rifaximin  and zinc .  She had 7 bowel movements in the last 24 hours.  In the setting of AKI reasonable to decrease lactulose  dosing.  Leukocytosis felt to be reactive and improving.  Currently afebrile.  Labs noteworthy for AKI that is improving-multifactorial related to shock, possible contrast nephropathy   Plan  Monitor CBC and transfuse to maintain hemoglobin greater than 7 Recommend advising IR of recent decline in hemoglobin If there is evidence of recurrent overt bleeding recommend resumption of octreotide  drip Replete potassium Continue rifaximin  550 mg p.o. twice daily Continue zinc  220 mg daily Monitor serial H&H and maintain hemoglobin greater than 7 Completed 5-day course of ceftriaxone  today Continue Coreg  3.125 mg p.o. twice daily Continue pantoprazole  40 mg IV daily Agree with making patient n.p.o. at this time Follow-up with [facility]/ [name], NP after hospitalization   LOS: 4 days   Susan [name]  10/14/2024, 2:42 PM  Susan Hausen, MD [facility]  "

## 2024-10-14 NOTE — Plan of Care (Signed)
   Problem: Education: Goal: Ability to describe self-care measures that may prevent or decrease complications (Diabetes Survival Skills Education) will improve Outcome: Progressing

## 2024-10-15 ENCOUNTER — Inpatient Hospital Stay (HOSPITAL_COMMUNITY)

## 2024-10-15 DIAGNOSIS — K766 Portal hypertension: Secondary | ICD-10-CM | POA: Diagnosis not present

## 2024-10-15 DIAGNOSIS — Z95828 Presence of other vascular implants and grafts: Secondary | ICD-10-CM | POA: Diagnosis not present

## 2024-10-15 DIAGNOSIS — K7682 Hepatic encephalopathy: Secondary | ICD-10-CM | POA: Diagnosis not present

## 2024-10-15 DIAGNOSIS — I864 Gastric varices: Secondary | ICD-10-CM | POA: Diagnosis not present

## 2024-10-15 DIAGNOSIS — K743 Primary biliary cirrhosis: Secondary | ICD-10-CM | POA: Diagnosis not present

## 2024-10-15 DIAGNOSIS — K922 Gastrointestinal hemorrhage, unspecified: Secondary | ICD-10-CM | POA: Diagnosis not present

## 2024-10-15 LAB — CBC WITH DIFFERENTIAL/PLATELET
Abs Immature Granulocytes: 0.29 K/uL — ABNORMAL HIGH (ref 0.00–0.07)
Basophils Absolute: 0.1 K/uL (ref 0.0–0.1)
Basophils Relative: 1 %
Eosinophils Absolute: 0.4 K/uL (ref 0.0–0.5)
Eosinophils Relative: 3 %
HCT: 26.4 % — ABNORMAL LOW (ref 36.0–46.0)
Hemoglobin: 9.1 g/dL — ABNORMAL LOW (ref 12.0–15.0)
Immature Granulocytes: 3 %
Lymphocytes Relative: 18 %
Lymphs Abs: 2 K/uL (ref 0.7–4.0)
MCH: 31.7 pg (ref 26.0–34.0)
MCHC: 34.5 g/dL (ref 30.0–36.0)
MCV: 92 fL (ref 80.0–100.0)
Monocytes Absolute: 1.8 K/uL — ABNORMAL HIGH (ref 0.1–1.0)
Monocytes Relative: 16 %
Neutro Abs: 6.3 K/uL (ref 1.7–7.7)
Neutrophils Relative %: 59 %
Platelets: 142 K/uL — ABNORMAL LOW (ref 150–400)
RBC: 2.87 MIL/uL — ABNORMAL LOW (ref 3.87–5.11)
RDW: 14.7 % (ref 11.5–15.5)
WBC: 10.8 K/uL — ABNORMAL HIGH (ref 4.0–10.5)
nRBC: 0.8 % — ABNORMAL HIGH (ref 0.0–0.2)

## 2024-10-15 LAB — PROTIME-INR
INR: 1.1 (ref 0.8–1.2)
Prothrombin Time: 14.9 s (ref 11.4–15.2)

## 2024-10-15 LAB — COMPREHENSIVE METABOLIC PANEL WITH GFR
ALT: 24 U/L (ref 0–44)
AST: 39 U/L (ref 15–41)
Albumin: 2.8 g/dL — ABNORMAL LOW (ref 3.5–5.0)
Alkaline Phosphatase: 96 U/L (ref 38–126)
Anion gap: 8 (ref 5–15)
BUN: 50 mg/dL — ABNORMAL HIGH (ref 6–20)
CO2: 20 mmol/L — ABNORMAL LOW (ref 22–32)
Calcium: 8.2 mg/dL — ABNORMAL LOW (ref 8.9–10.3)
Chloride: 104 mmol/L (ref 98–111)
Creatinine, Ser: 1.28 mg/dL — ABNORMAL HIGH (ref 0.44–1.00)
GFR, Estimated: 48 mL/min — ABNORMAL LOW
Glucose, Bld: 112 mg/dL — ABNORMAL HIGH (ref 70–99)
Potassium: 4 mmol/L (ref 3.5–5.1)
Sodium: 133 mmol/L — ABNORMAL LOW (ref 135–145)
Total Bilirubin: 1.2 mg/dL (ref 0.0–1.2)
Total Protein: 5.7 g/dL — ABNORMAL LOW (ref 6.5–8.1)

## 2024-10-15 LAB — GLUCOSE, CAPILLARY
Glucose-Capillary: 120 mg/dL — ABNORMAL HIGH (ref 70–99)
Glucose-Capillary: 142 mg/dL — ABNORMAL HIGH (ref 70–99)
Glucose-Capillary: 117 mg/dL — ABNORMAL HIGH (ref 70–99)
Glucose-Capillary: 159 mg/dL — ABNORMAL HIGH (ref 70–99)

## 2024-10-15 LAB — TYPE AND SCREEN
ABO/RH(D): A POS
Antibody Screen: NEGATIVE
Unit division: 0

## 2024-10-15 LAB — BPAM RBC
Blood Product Expiration Date: 202602072359
ISSUE DATE / TIME: 202601211748
Unit Type and Rh: 6200

## 2024-10-15 LAB — MAGNESIUM: Magnesium: 2.3 mg/dL (ref 1.7–2.4)

## 2024-10-15 LAB — AMMONIA: Ammonia: 48 umol/L — ABNORMAL HIGH (ref 9–35)

## 2024-10-15 MED ORDER — FENTANYL CITRATE (PF) 100 MCG/2ML IJ SOLN
INTRAMUSCULAR | Status: AC | PRN
  Administered 2024-10-15 (×5): 25 ug via INTRAVENOUS

## 2024-10-15 MED ORDER — MIDAZOLAM HCL (PF) 2 MG/2ML IJ SOLN
INTRAMUSCULAR | Status: AC | PRN
  Administered 2024-10-15: .5 mg via INTRAVENOUS
  Administered 2024-10-15: 1 mg via INTRAVENOUS
  Administered 2024-10-15 (×4): .5 mg via INTRAVENOUS

## 2024-10-15 MED ORDER — LIDOCAINE-EPINEPHRINE 1 %-1:100000 IJ SOLN
20.0000 mL | Freq: Once | INTRAMUSCULAR | Status: AC
  Administered 2024-10-15: 15 mL via INTRADERMAL
  Filled 2024-10-15: qty 20

## 2024-10-15 MED ORDER — DIPHENHYDRAMINE HCL 50 MG/ML IJ SOLN
INTRAMUSCULAR | Status: AC | PRN
  Administered 2024-10-15 (×2): 25 mg via INTRAVENOUS

## 2024-10-15 MED ORDER — LIDOCAINE-EPINEPHRINE 1 %-1:100000 IJ SOLN
INTRAMUSCULAR | Status: AC
  Filled 2024-10-15: qty 1

## 2024-10-15 MED ORDER — DIPHENHYDRAMINE HCL 50 MG/ML IJ SOLN
INTRAMUSCULAR | Status: AC
  Filled 2024-10-15: qty 1

## 2024-10-15 MED ORDER — MIDAZOLAM HCL 2 MG/2ML IJ SOLN
INTRAMUSCULAR | Status: AC
  Filled 2024-10-15: qty 4

## 2024-10-15 MED ORDER — CHLORHEXIDINE GLUCONATE 4 % EX SOLN
Freq: Once | CUTANEOUS | Status: AC
  Administered 2024-10-15: 1 via TOPICAL

## 2024-10-15 MED ORDER — FENTANYL CITRATE (PF) 100 MCG/2ML IJ SOLN
INTRAMUSCULAR | Status: AC
  Filled 2024-10-15: qty 4

## 2024-10-15 NOTE — Progress Notes (Signed)
 " PROGRESS NOTE    Susan [name]  FMW:[id number] DOB: [birth year] DOA: 10/09/2024 PCP: Corwin Antu, FNP   Brief Narrative:  59 year old female with history of alcoholic cirrhosis, esophageal varices, primary biliary cholangitis, hepatic encephalopathy, hypertension, hyperlipidemia, obesity and hospitalization in December 2025 for hepatic encephalopathy presented with hematemesis.  She was initially admitted to ICU and intubated for airway protection she was transfused packed red cells.  She underwent EGD on 10/10/2024 followed by TIPS.  She was extubated on 10/11/2024.  Care has been transferred to [facility] service from 10/13/2024  Assessment & Plan:   Hemorrhagic shock Upper GI bleeding from variceal bleeding Acute blood loss anemia - Has received 1 unit packed red cells during this hospitalization.  Hemoglobin 6.6 on 10/14/2024: Transfused 1 unit packed red cells.  Hemoglobin 9.1 this morning.  Monitor H&H -underwent EGD on 10/10/2024: Large amount of clotted blood found in the stomach along with type I gastric varices -s/p TIPS on 10/10/2024: Coil embolization of posterior and left gastric veins, and coil assisted retrograde transvenous obliteration of gastrorenal shunt.  IR following.  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  IR planning for TIPS revision today -GI following: Currently IV Protonix .  Off octreotide  drip.  Completed 5-day course of Rocephin . - Keep n.p.o. this morning  Alcoholic cirrhosis Hepatic encephalopathy - GI following.  Continue rifaximin  and lactulose  monitor mental status  AKI - Possibly prerenal.  Creatinine improving to 1.28 this morning.  Monitor  Hypokalemia - Improved  Hypocalcemia - Improved  Hyponatremia - Mild.  Monitor  Leukocytosis - Resolved  Thrombocytopenia - Monitor  Acute metabolic acidosis - Mild.  Monitor  Acute hypoxic respiratory failure -Status post intubation on  admission and extubated on 10/11/2024.  Currently on room air  Diabetes mellitus type 2 -Continue CBGs with SSI  Obesity class III - Outpatient follow-up  Physical deconditioning - Will need home health PT/OT    DVT prophylaxis: SCDs.  Heparin  subcutaneous Code Status: Full Family Communication: Husband at bedside Disposition Plan: Status is: Inpatient Remains inpatient appropriate because: Of severity of illness    Consultants: GI/IR  Procedures: As above  Antimicrobials: Rocephin  from 10/10/2024 onwards   Subjective: Patient seen and examined at bedside.  Has intermittent abdominal pain.  No chest pain, worsening shortness of breath or vomiting reported. Objective: Vitals:   10/14/24 1811 10/14/24 2125 10/15/24 0500 10/15/24 0613  BP: (!) 97/51 (!) 105/53  (!) 106/52  Pulse: 64 67  73  Resp: 18 16  16   Temp: 98.5 F (36.9 C) 98.4 F (36.9 C)  98.2 F (36.8 C)  TempSrc:  Oral  Oral  SpO2:  100%  100%  Weight:   97 kg   Height:        Intake/Output Summary (Last 24 hours) at 10/15/2024 0820 Last data filed at 10/14/2024 2125 Gross per 24 hour  Intake 673.33 ml  Output --  Net 673.33 ml   Filed Weights   10/13/24 0500 10/14/24 0708 10/15/24 0500  Weight: 93.3 kg 93.3 kg 97 kg    Examination:  General: On room air.  No distress ENT/neck: No thyromegaly.  JVD is not elevated  respiratory: Decreased breath sounds at bases bilaterally with some crackles; no wheezing  CVS: S1-S2 heard, rate controlled currently Abdominal: Soft, morbidly obese, mildly tender, slightly distended; no organomegaly,  bowel sounds are heard Extremities: Trace lower extremity edema; no cyanosis  CNS: Remains slow to  respond and a poor historian.  No focal neurologic deficit.  Moves extremities Lymph: No obvious lymphadenopathy Skin: No obvious ecchymosis/lesions  psych: Mostly flat affect with no signs of agitation  musculoskeletal: No obvious joint swelling/deformity    Data  Reviewed: I have personally reviewed following labs and imaging studies  CBC: Recent Labs  Lab 10/12/24 0557 10/12/24 2019 10/13/24 0458 10/14/24 0526 10/15/24 0529  WBC 19.4* 18.7* 15.4* 12.9* 10.8*  NEUTROABS 13.3* 12.2* 10.4* 7.6 6.3  HGB 8.2* 7.9* 8.0* 6.6* 9.1*  HCT 23.9* 23.0* 23.4* 19.5* 26.4*  MCV 91.6 93.5 92.5 94.7 92.0  PLT 114* 114* 117* 123* 142*   Basic Metabolic Panel: Recent Labs  Lab 10/11/24 0905 10/12/24 0551 10/12/24 0557 10/13/24 1127 10/14/24 0526 10/15/24 0529  NA 137  --  136 133* 133* 133*  K 4.7  --  4.0 3.7 2.7* 4.0  CL 106  --  106 102 107 104  CO2 19*  --  20* 23 17* 20*  GLUCOSE 230*  --  172* 152* 102* 112*  BUN 62*  --  79* 78* 56* 50*  CREATININE 2.30*  --  2.65* 2.13* 1.31* 1.28*  CALCIUM  8.0*  --  7.8* 7.8* 5.8* 8.2*  MG  --  1.7  --   --  1.7 2.3  PHOS  --  4.2  --   --   --   --    GFR: Estimated Creatinine Clearance: 50 mL/min (A) (by C-G formula based on SCr of 1.28 mg/dL (H)). Liver Function Tests: Recent Labs  Lab 10/09/24 2348 10/11/24 0905 10/14/24 0526 10/15/24 0529  AST 40 64* 37 39  ALT 20 31 21 24   ALKPHOS 124 94 76 96  BILITOT 1.6* 0.6 0.7 1.2  PROT 6.5 5.6* 4.3* 5.7*  ALBUMIN 3.0* 2.7* 2.2* 2.8*   Recent Labs  Lab 10/09/24 2348  LIPASE 84*   Recent Labs  Lab 10/10/24 0754 10/12/24 0557 10/15/24 0529  AMMONIA 113* 42* 48*   Coagulation Profile: Recent Labs  Lab 10/10/24 0040 10/11/24 0905 10/15/24 0529  INR 1.1 1.3* 1.1   Cardiac Enzymes: No results for input(s): CKTOTAL, CKMB, CKMBINDEX, TROPONINI in the last 168 hours. BNP (last 3 results) No results for input(s): PROBNP in the last 8760 hours. HbA1C: No results for input(s): HGBA1C in the last 72 hours. CBG: Recent Labs  Lab 10/14/24 0749 10/14/24 1213 10/14/24 1634 10/14/24 2142 10/15/24 0748  GLUCAP 132* 168* 88 153* 120*   Lipid Profile: No results for input(s): CHOL, HDL, LDLCALC, TRIG, CHOLHDL,  LDLDIRECT in the last 72 hours.  Thyroid  Function Tests: No results for input(s): TSH, T4TOTAL, FREET4, T3FREE, THYROIDAB in the last 72 hours. Anemia Panel: No results for input(s): VITAMINB12, FOLATE, FERRITIN, TIBC, IRON, RETICCTPCT in the last 72 hours. Sepsis Labs: No results for input(s): PROCALCITON, LATICACIDVEN in the last 168 hours.  Recent Results (from the past 240 hours)  MRSA Next Gen by PCR, Nasal     Status: None   Collection Time: 10/10/24  4:17 AM   Specimen: Nasal Mucosa; Nasal Swab  Result Value Ref Range Status   MRSA by PCR Next Gen NOT DETECTED NOT DETECTED Final    Comment: (NOTE) The GeneXpert MRSA Assay (FDA approved for NASAL specimens only), is one component of a comprehensive MRSA colonization surveillance program. It is not intended to diagnose MRSA infection nor to guide or monitor treatment for MRSA infections. Test performance is not FDA approved in patients less than 2 years  old. Performed at [facility], [address], [place], [place] [address]          Radiology Studies: CT ANGIO ABD/PELVIS BRTO Result Date: 10/14/2024 EXAM: CTA ABDOMEN AND PELVIS WITH CONTRAST 10/14/2024 03:49:00 PM TECHNIQUE: CTA images of the abdomen and pelvis with intravenous contrast. Three-dimensional MIP/volume rendered formations were performed. Automated exposure control, iterative reconstruction, and/or weight based adjustment of the mA/kV was utilized to reduce the radiation dose to as low as reasonably achievable. COMPARISON: 1 17 26  CLINICAL HISTORY: s/p TIPS/BRTO, drop in hgb FINDINGS: Blood pool is hypodense compared to the interventricular septum suggesting anemia. VASCULATURE: AORTA: No acute finding. No abdominal aortic aneurysm. No dissection. CELIAC TRUNK: No acute finding. No occlusion or significant stenosis. SUPERIOR MESENTERIC ARTERY: No acute finding. No occlusion or significant stenosis. RENAL ARTERIES: No acute  finding. No occlusion or significant stenosis. INFERIOR MESENTERIC ARTERY: Focal calcified plaque of the origin of the inferior mesenteric artery which is patent distally. ILIAC ARTERIES: Mild scattered calcified plaque in bilateral internal iliac arteries. No occlusion or significant stenosis. 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 SUPERIOR MESENTERIC VEIN: Superior mesenteric vein patent. ILIAC VEINS, IVC, RENAL VEINS: Patent iliac venous system, IVC, and bilateral renal veins. TIPS STENT: No definite contrast enhancement through the TIPS stent. VARICES: Streak artifact from multiple embolization coils degrades evaluation of left upper quadrant varices. However, some visible varices near the GE junction remain patent. LIVER: Nodular hepatic contour. GALLBLADDER AND BILE DUCTS: Gallbladder is unremarkable. No biliary ductal dilatation. SPLEEN: The spleen is unremarkable. PANCREAS: The pancreas is unremarkable. ADRENAL GLANDS: Bilateral adrenal glands demonstrate no acute abnormality. KIDNEYS, URETERS AND BLADDER: No stones in the kidneys or ureters. No hydronephrosis. No perinephric or periureteral stranding. Urinary bladder is unremarkable. GI AND BOWEL: Stomach and duodenal sweep demonstrate no acute abnormality. There is no bowel obstruction. No abnormal bowel wall thickening or distension. REPRODUCTIVE: Reproductive organs are unremarkable. PERITONEUM AND RETROPERITONEUM: Small volume pelvic and perihepatic ascites, new since previous. No free air. LUNG BASE: No acute abnormality. LYMPH NODES: No lymphadenopathy. BONES AND SOFT TISSUES: Early degenerative disc disease L3-L4. No acute abnormality of the bones. No acute soft tissue abnormality. IMPRESSION: 1. 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 2. Small volume pelvic and perihepatic ascites, new since previous. 3. Streak artifact from multiple  embolization coils limits evaluation of left upper quadrant varices; some visible varices near the GE junction remain patent. 4. Hypodense blood pool compared to the interventricular septum, suggesting anemia. Electronically signed by: Dayne Hassell MD 10/14/2024 04:24 PM EST RP Workstation: [id number]        Scheduled Meds:  sodium chloride    Intravenous Once   carvedilol   3.125 mg Oral BID WC   Chlorhexidine  Gluconate Cloth  6 each Topical Daily   insulin  aspart  0-15 Units Subcutaneous TID with meals   lactulose   10 g Oral BID   pantoprazole  (PROTONIX ) IV  40 mg Intravenous Q24H   rifaximin   550 mg Oral BID   zinc  sulfate (50mg  elemental zinc )  220 mg Oral BID   Continuous Infusions:          Sophie Mao, MD [facility] 10/15/2024, 8:20 AM   "

## 2024-10-15 NOTE — Progress Notes (Signed)
 "   Inpatient Progress Note     Patient Profile/Chief Complaint  59 year old female with a past medical history of autoimmune liver cirrhosis, hepatic encephalopathy, gastric varices, primary biliary cholangitis, hypertension, hyperlipidemia, obesity who presented to the hospital with multiple episodes of bloody emesis secondary to bleeding gastric varix.  EGD 10/10/2024 - no esophageal varices, clotted blood in the cardia, gastric fundus and in the gastric body, large amount of type I isolated gastric varix in the cardia and fundus with no active bleeding but did have stigmata of recent bleeding and hematoma, medium in size  CTAP with new finding of spontaneous splenorenal shunt   10/10/2024  status post TIPS, coil embolization of posterior left gastric veins and coil assisted retrograde transvenous obliteration of gastrorenal shunt  Transferred from ICU to floor 10/12/2024.  10/14/2024 noted to have hemoglobin drop 8.0 --> 6.6; CTA/BRTO showed occlusive thrombus in main portal vein c/f TIPS occlusion status post TIPS revision 10/15/2024   Interval History   -- Sleepy status post sedation for TIPS revision -- 1 stool recorded yesterday -- Hemoglobin stable at 9.1  MELD 3.0: 16 at 10/15/2024  5:29 AM MELD-Na: 11 at 10/15/2024  5:29 AM Calculated from: Serum Creatinine: 1.28 mg/dL at 8/77/7973  4:70 AM Serum Sodium: 133 mmol/L at 10/15/2024  5:29 AM Total Bilirubin: 1.2 mg/dL at 8/77/7973  4:70 AM Serum Albumin: 2.8 g/dL at 8/77/7973  4:70 AM INR(ratio): 1.1 at 10/15/2024  5:29 AM Age at listing (hypothetical): 59 years Sex: Female at 10/15/2024  5:29 AM      Objective   Vital signs in last 24 hours: Temp:  [98.1 F (36.7 C)-98.5 F (36.9 C)] 98.1 F (36.7 C) (01/22 1325) Pulse Rate:  [61-79] 73 (01/22 1325) Resp:  [16-32] 18 (01/22 1325) BP: (96-124)/(49-63) 104/56 (01/22 1325) SpO2:  [97 %-100 %] 97 % (01/22 1325) Weight:  [97 kg] 97 kg (01/22 0500) Last BM Date :  10/14/24 General:    Sleepy status post TIPS revision Heart:  Regular rate and rhythm; no murmurs Lungs: Respirations even and unlabored, lungs CTA bilaterally Abdomen:  Soft, tender to palpation in epigastrium and right upper quadrant and nondistended. Normal bowel sounds. Extremities:  Without edema. Neurologic: Somnolent after sedation for TIPS revision   Intake/Output from previous day: 01/21 0701 - 01/22 0700 In: 673.3 [I.V.:10; Blood:663.3] Out: -  Intake/Output this shift: No intake/output data recorded.  Lab Results: Recent Labs    10/13/24 0458 10/14/24 0526 10/15/24 0529  WBC 15.4* 12.9* 10.8*  HGB 8.0* 6.6* 9.1*  HCT 23.4* 19.5* 26.4*  PLT 117* 123* 142*   BMET Recent Labs    10/13/24 1127 10/14/24 0526 10/15/24 0529  NA 133* 133* 133*  K 3.7 2.7* 4.0  CL 102 107 104  CO2 23 17* 20*  GLUCOSE 152* 102* 112*  BUN 78* 56* 50*  CREATININE 2.13* 1.31* 1.28*  CALCIUM  7.8* 5.8* 8.2*   LFT Recent Labs    10/15/24 0529  PROT 5.7*  ALBUMIN 2.8*  AST 39  ALT 24  ALKPHOS 96  BILITOT 1.2   PT/INR Recent Labs    10/15/24 0529  LABPROT 14.9  INR 1.1    Ammonia 42  Studies/Results: CT ANGIO ABD/PELVIS BRTO Result Date: 10/14/2024 EXAM: CTA ABDOMEN AND PELVIS WITH CONTRAST 10/14/2024 03:49:00 PM TECHNIQUE: CTA images of the abdomen and pelvis with intravenous contrast. Three-dimensional MIP/volume rendered formations were performed. Automated exposure control, iterative reconstruction, and/or weight based adjustment of the mA/kV was utilized to reduce the  radiation dose to as low as reasonably achievable. COMPARISON: 1 17 26  CLINICAL HISTORY: s/p TIPS/BRTO, drop in hgb FINDINGS: Blood pool is hypodense compared to the interventricular septum suggesting anemia. VASCULATURE: AORTA: No acute finding. No abdominal aortic aneurysm. No dissection. CELIAC TRUNK: No acute finding. No occlusion or significant stenosis. SUPERIOR MESENTERIC ARTERY: No acute finding. No  occlusion or significant stenosis. RENAL ARTERIES: No acute finding. No occlusion or significant stenosis. INFERIOR MESENTERIC ARTERY: Focal calcified plaque of the origin of the inferior mesenteric artery which is patent distally. ILIAC ARTERIES: Mild scattered calcified plaque in bilateral internal iliac arteries. No occlusion or significant stenosis. 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 SUPERIOR MESENTERIC VEIN: Superior mesenteric vein patent. ILIAC VEINS, IVC, RENAL VEINS: Patent iliac venous system, IVC, and bilateral renal veins. TIPS STENT: No definite contrast enhancement through the TIPS stent. VARICES: Streak artifact from multiple embolization coils degrades evaluation of left upper quadrant varices. However, some visible varices near the GE junction remain patent. LIVER: Nodular hepatic contour. GALLBLADDER AND BILE DUCTS: Gallbladder is unremarkable. No biliary ductal dilatation. SPLEEN: The spleen is unremarkable. PANCREAS: The pancreas is unremarkable. ADRENAL GLANDS: Bilateral adrenal glands demonstrate no acute abnormality. KIDNEYS, URETERS AND BLADDER: No stones in the kidneys or ureters. No hydronephrosis. No perinephric or periureteral stranding. Urinary bladder is unremarkable. GI AND BOWEL: Stomach and duodenal sweep demonstrate no acute abnormality. There is no bowel obstruction. No abnormal bowel wall thickening or distension. REPRODUCTIVE: Reproductive organs are unremarkable. PERITONEUM AND RETROPERITONEUM: Small volume pelvic and perihepatic ascites, new since previous. No free air. LUNG BASE: No acute abnormality. LYMPH NODES: No lymphadenopathy. BONES AND SOFT TISSUES: Early degenerative disc disease L3-L4. No acute abnormality of the bones. No acute soft tissue abnormality. IMPRESSION: 1. 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 2. Small volume pelvic and perihepatic  ascites, new since previous. 3. Streak artifact from multiple embolization coils limits evaluation of left upper quadrant varices; some visible varices near the GE junction remain patent. 4. Hypodense blood pool compared to the interventricular septum, suggesting anemia. Electronically signed by: Katheleen Faes MD 10/14/2024 04:24 PM EST RP Workstation: [id number]     Endoscopic Studies: EGD 10/10/2024 - no esophageal varices, clotted blood in the cardia, gastric fundus and in the gastric body, large amount of type I isolated gastric varix in the cardia and fundus with no active bleeding but did have stigmata of recent bleeding and hematoma, medium in size     Clinical Impression   59 year old female with a past medical history of autoimmune liver cirrhosis, hepatic encephalopathy, gastric varices, primary biliary cholangitis, hypertension, hyperlipidemia, obesity who presented to the hospital with multiple episodes of bloody emesis.   EGD 10/10/2024 - no esophageal varices, clotted blood in the cardia, gastric fundus and in the gastric body, large amount of type I isolated gastric varix in the cardia and fundus with no active bleeding but did have stigmata of recent bleeding and hematoma, medium in size  CTAP with new finding of spontaneous splenorenal shunt   10/10/2024  status post TIPS, coil embolization of posterior left gastric veins and coil assisted retrograde transvenous obliteration of gastrorenal shunt  Ms. Clarisse was clinically stable until 10/14/2024 when hemoglobin was noted to decline from 8.0 --> 6.6.  CTA/BRTO showed concern for TIPS occlusion and she underwent revision of her TIPS today.  At the time of my interview, she was sleepy status post sedation  for her TIPS revision procedure.  Family member at bedside reports that prior to undergoing sedation today her mental status was continuing to improve and she was conversant.  1 bowel movement was recorded over the last 24 hours but  this may not be entirely accurate.  Lactulose  had previously been decreased because she was having excessive diarrhea within the last 48 hours.  Ms. [place] has not shown evidence of overt GI bleeding since TIPS/BRTO.  Overnight hemoglobin declined 8.0--> 6.6.  No hematemesis or coffee-ground emesis.  She is receiving 1 unit PRBC.  This raises concern for potential evolving recurrent GI bleeding and have recommended reengaging IR as her bleeding has not been amenable to endoscopic intervention.  She has been somnolent and manifesting symptoms of hepatic encephalopathy likely exacerbated status post TIPS.  Ammonia has decreased with lactulose , rifaximin  and zinc .  She had 7 bowel movements in the last 24 hours.  In the setting of AKI reasonable to decrease lactulose  dosing.  Leukocytosis felt to be reactive and improving.  Currently afebrile.  Labs noteworthy for AKI that is improving-multifactorial related to shock, possible contrast nephropathy   Plan  Monitor CBC and transfuse to maintain hemoglobin greater than 7 If there is evidence of recurrent overt bleeding recommend resumption of octreotide  drip Continue lactulose  10 g p.o. twice daily -titrate to 3-4 bowel movements daily Continue rifaximin  550 mg p.o. twice daily Continue zinc  220 mg daily Monitor serial H&H and maintain hemoglobin greater than 7 Continue Coreg  3.125 mg p.o. twice daily Continue pantoprazole  40 mg IV daily Follow-up with [facility]/ [name], NP after hospitalization   LOS: 5 days   Susan [name]  10/15/2024, 3:49 PM  Susan Hausen, MD [facility]  "

## 2024-10-15 NOTE — Sedation Documentation (Signed)
 Pre tips revision numbers RA: 0 Portal vein: 30  Post tips revision numbers RA: 0 Portal vein: 29

## 2024-10-15 NOTE — Procedures (Signed)
 Vascular and Interventional Radiology Procedure Note  Patient: Susan [name] DOB: 1966-08-21 Medical Record Number: [id number] Note Date/Time: 10/15/24 1:02 PM   Performing Physician: Thom Hall, MD Assistant(s): None  Diagnosis: TIPS thrombosis   Procedure(s):  TRANSJUGULAR INTRAHEPATIC PORTOSYSTEMIC SHUNT (TIPS) THROMBECTOMY and REVISION THERAPEUTIC PARACENTESIS  Anesthesia: Conscious Sedation Complications: None Estimated Blood Loss: Minimal Specimens: Pathology  Findings:  - access via the RIGHT jugular vein. - Successful TIPS revision by mechanical thrombectomy w Penumbra 12, followed by balloon angioplasty to 8 mm.  - Minimal reduction of portosystemic gradient from 30 to 29 - Concern for hepatofugal portal flow, may require AC to maintain TIPS patency - Therapeutic paracentesis with 600 mL of SS ascites removed.   Plan: - Bedrest x2 hrs and RLE straight. No HOB restrictions. - Lactulose  TID and monitor for hepatic encephalopathy. - NH3 in AM.  Final report to follow once all images are reviewed and compared with previous studies.  See detailed dictation with images in PACS. The patient tolerated the procedure well without incident or complication and was returned to Recovery in stable condition.    Thom Hall, MD [facility]   Pager. [phone] Clinic. [phone]

## 2024-10-15 NOTE — Progress Notes (Signed)
 OT Cancellation Note  Patient Details Name: Susan [name] MRN: [id number] DOB: [birth year]   Cancelled Treatment:    Reason Eval/Treat Not Completed: Patient at procedure or test/ unavailable. TIPS revision.   Kennth Mliss Helling 10/15/2024, 12:58 PM Mliss HERO, OTR/L Acute Rehabilitation Services Office: [phone]

## 2024-10-16 DIAGNOSIS — I864 Gastric varices: Secondary | ICD-10-CM | POA: Diagnosis not present

## 2024-10-16 DIAGNOSIS — K7682 Hepatic encephalopathy: Secondary | ICD-10-CM | POA: Diagnosis not present

## 2024-10-16 DIAGNOSIS — K922 Gastrointestinal hemorrhage, unspecified: Secondary | ICD-10-CM | POA: Diagnosis not present

## 2024-10-16 DIAGNOSIS — K743 Primary biliary cirrhosis: Secondary | ICD-10-CM | POA: Diagnosis not present

## 2024-10-16 LAB — CBC WITH DIFFERENTIAL/PLATELET
Abs Immature Granulocytes: 0.49 K/uL — ABNORMAL HIGH (ref 0.00–0.07)
Basophils Absolute: 0.1 K/uL (ref 0.0–0.1)
Basophils Relative: 0 %
Eosinophils Absolute: 0.4 K/uL (ref 0.0–0.5)
Eosinophils Relative: 3 %
HCT: 26.1 % — ABNORMAL LOW (ref 36.0–46.0)
Hemoglobin: 8.9 g/dL — ABNORMAL LOW (ref 12.0–15.0)
Immature Granulocytes: 4 %
Lymphocytes Relative: 13 %
Lymphs Abs: 1.8 K/uL (ref 0.7–4.0)
MCH: 32.1 pg (ref 26.0–34.0)
MCHC: 34.1 g/dL (ref 30.0–36.0)
MCV: 94.2 fL (ref 80.0–100.0)
Monocytes Absolute: 2.1 K/uL — ABNORMAL HIGH (ref 0.1–1.0)
Monocytes Relative: 15 %
Neutro Abs: 9 K/uL — ABNORMAL HIGH (ref 1.7–7.7)
Neutrophils Relative %: 65 %
Platelets: 169 K/uL (ref 150–400)
RBC: 2.77 MIL/uL — ABNORMAL LOW (ref 3.87–5.11)
RDW: 15.1 % (ref 11.5–15.5)
WBC: 13.9 K/uL — ABNORMAL HIGH (ref 4.0–10.5)
nRBC: 0.7 % — ABNORMAL HIGH (ref 0.0–0.2)

## 2024-10-16 LAB — GLUCOSE, CAPILLARY
Glucose-Capillary: 117 mg/dL — ABNORMAL HIGH (ref 70–99)
Glucose-Capillary: 186 mg/dL — ABNORMAL HIGH (ref 70–99)
Glucose-Capillary: 135 mg/dL — ABNORMAL HIGH (ref 70–99)

## 2024-10-16 LAB — COMPREHENSIVE METABOLIC PANEL WITH GFR
ALT: 20 U/L (ref 0–44)
AST: 40 U/L (ref 15–41)
Albumin: 2.7 g/dL — ABNORMAL LOW (ref 3.5–5.0)
Alkaline Phosphatase: 97 U/L (ref 38–126)
Anion gap: 8 (ref 5–15)
BUN: 47 mg/dL — ABNORMAL HIGH (ref 6–20)
CO2: 21 mmol/L — ABNORMAL LOW (ref 22–32)
Calcium: 8.3 mg/dL — ABNORMAL LOW (ref 8.9–10.3)
Chloride: 102 mmol/L (ref 98–111)
Creatinine, Ser: 1.53 mg/dL — ABNORMAL HIGH (ref 0.44–1.00)
GFR, Estimated: 39 mL/min — ABNORMAL LOW
Glucose, Bld: 123 mg/dL — ABNORMAL HIGH (ref 70–99)
Potassium: 4.3 mmol/L (ref 3.5–5.1)
Sodium: 131 mmol/L — ABNORMAL LOW (ref 135–145)
Total Bilirubin: 1.2 mg/dL (ref 0.0–1.2)
Total Protein: 5.7 g/dL — ABNORMAL LOW (ref 6.5–8.1)

## 2024-10-16 LAB — MAGNESIUM: Magnesium: 2.3 mg/dL (ref 1.7–2.4)

## 2024-10-16 MED ORDER — LACTULOSE 10 GM/15ML PO SOLN
10.0000 g | Freq: Three times a day (TID) | ORAL | Status: DC
  Administered 2024-10-16 – 2024-10-21 (×6): 10 g via ORAL
  Filled 2024-10-16 (×12): qty 15

## 2024-10-16 NOTE — Progress Notes (Signed)
 PT Cancellation Note  Patient Details Name: Susan [name] MRN: [id number] DOB: 12-Mar-1966   Cancelled Treatment:    Reason Eval/Treat Not Completed: (P) Fatigue/lethargy limiting ability to participate (pt up in chair but somnolent. Pt/spouse request PTA return later in the day, they are meeting with their pastor at 1pm so request sometime after that. Likely to retry after 3pm today.) Will continue efforts later in day per PT plan of care as schedule permits. Unit secretary notified to order TED hose for patient, discussed with Dr Cheryle as pt reports she was still dizzy getting OOB to chair today.   Connell HERO Armonte Tortorella 10/16/2024, 12:11 PM

## 2024-10-16 NOTE — Progress Notes (Signed)
 " PROGRESS NOTE    Susan [name]  FMW:[id number] DOB: [birth year] DOA: 10/09/2024 PCP: Corwin Antu, FNP   Brief Narrative:  59 year old female with history of alcoholic cirrhosis, esophageal varices, primary biliary cholangitis, hepatic encephalopathy, hypertension, hyperlipidemia, obesity and hospitalization in December 2025 for hepatic encephalopathy presented with hematemesis.  She was initially admitted to ICU and intubated for airway protection she was transfused packed red cells.  She underwent EGD on 10/10/2024 followed by TIPS.  She was extubated on 10/11/2024.  Care has been transferred to [facility] service from 10/13/2024  Assessment & Plan:   Hemorrhagic shock Upper GI bleeding from variceal bleeding Acute blood loss anemia - Has received 2 unit packed red cells during this hospitalization: Latest on 10/14/2024: Transfused 1 unit packed red cells for hemoglobin of 6.6.  Hemoglobin 8.9 this morning.  Monitor H&H -underwent EGD on 10/10/2024: Large amount of clotted blood found in the stomach along with type I gastric varices -s/p TIPS on 10/10/2024: Coil embolization of posterior and left gastric veins, and coil assisted retrograde transvenous obliteration of gastrorenal shunt.  IR following.  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  Patient underwent TIPS thrombectomy and revision and therapeutic paracentesis with removal of 600 cc fluid by IR on 10/15/2024 -GI following: Currently IV Protonix .  Off octreotide  drip.  Completed 5-day course of Rocephin . - Diet advancement as per GI  Alcoholic cirrhosis Hepatic encephalopathy - GI following.  Continue rifaximin  and lactulose  monitor mental status  AKI - Possibly prerenal.  Creatinine slightly worsened to 1.53 this morning.  Monitor  Hypokalemia - Improved  Hypocalcemia - Improved  Hyponatremia - Mild.  Monitor  Leukocytosis - Monitor.  Thrombocytopenia -  Resolved  Acute metabolic acidosis - Mild.  Monitor  Acute hypoxic respiratory failure -Status post intubation on admission and extubated on 10/11/2024.  Currently on room air  Diabetes mellitus type 2 -Continue CBGs with SSI  Obesity class III - Outpatient follow-up  Physical deconditioning - Will need home health PT/OT    DVT prophylaxis: SCDs.   Code Status: Full Family Communication: Husband at bedside Disposition Plan: Status is: Inpatient Remains inpatient appropriate because: Of severity of illness    Consultants: GI/IR  Procedures: As above  Antimicrobials:  Anti-infectives (From admission, onward)    Start     Dose/Rate Route Frequency Ordered Stop   10/14/24 0915  cefTRIAXone  (ROCEPHIN ) 1 g in sodium chloride  0.9 % 100 mL IVPB        1 g 200 mL/hr over 30 Minutes Intravenous Every 24 hours 10/14/24 0829 10/14/24 0959   10/11/24 1345  rifaximin  (XIFAXAN ) tablet 550 mg        550 mg Oral 2 times daily 10/11/24 1256     10/10/24 0830  cefTRIAXone  (ROCEPHIN ) 1 g in sodium chloride  0.9 % 100 mL IVPB        1 g 200 mL/hr over 30 Minutes Intravenous Every 24 hours 10/10/24 0730 10/13/24 9073        Subjective: Patient seen and examined at bedside.  Complains of intermittent abdominal pain.  Feels very weak.  Denies any vomiting, fever, worsening shortness of breath  objective: Vitals:   10/15/24 2045 10/15/24 2344 10/16/24 0500 10/16/24 0526  BP: 105/68 (!) 108/54  (!) 107/55  Pulse: 68 76  73  Resp:      Temp: 98.3 F (36.8 C) 98.3 F (36.8 C)  98.4 F (36.9 C)  TempSrc: Oral Oral  Oral  SpO2: 98% 98%  97%  Weight:   97.1 kg   Height:        Intake/Output Summary (Last 24 hours) at 10/16/2024 0803 Last data filed at 10/15/2024 2345 Gross per 24 hour  Intake 240 ml  Output 650 ml  Net -410 ml   Filed Weights   10/14/24 0708 10/15/24 0500 10/16/24 0500  Weight: 93.3 kg 97 kg 97.1 kg    Examination:  General: Remains on room air and in no  distress.  Looks chronically ill and deconditioned ENT/neck: No JVD elevation or palpable neck masses respiratory: Decreased breath sounds at bases with scattered crackles  CVS: Currently rate controlled; S1 and S2 around  abdominal: Soft, morbidly obese, slightly tender and distended; no organomegaly,  bowel sounds are heard normally Extremities: No clubbing; mild lower extremity edema present CNS: Still slow to respond and a poor historian.  No focal neurologic deficit.  Able to move extremities Lymph: No obvious palpable lymphadenopathy Skin: No obvious petechiae/rashes  psych: Affect is flat with no signs of agitation currently  musculoskeletal: No obvious joint tenderness/erythema   Data Reviewed: I have personally reviewed following labs and imaging studies  CBC: Recent Labs  Lab 10/12/24 2019 10/13/24 0458 10/14/24 0526 10/15/24 0529 10/16/24 0551  WBC 18.7* 15.4* 12.9* 10.8* 13.9*  NEUTROABS 12.2* 10.4* 7.6 6.3 9.0*  HGB 7.9* 8.0* 6.6* 9.1* 8.9*  HCT 23.0* 23.4* 19.5* 26.4* 26.1*  MCV 93.5 92.5 94.7 92.0 94.2  PLT 114* 117* 123* 142* 169   Basic Metabolic Panel: Recent Labs  Lab 10/12/24 0551 10/12/24 0557 10/13/24 1127 10/14/24 0526 10/15/24 0529 10/16/24 0551  NA  --  136 133* 133* 133* 131*  K  --  4.0 3.7 2.7* 4.0 4.3  CL  --  106 102 107 104 102  CO2  --  20* 23 17* 20* 21*  GLUCOSE  --  172* 152* 102* 112* 123*  BUN  --  79* 78* 56* 50* 47*  CREATININE  --  2.65* 2.13* 1.31* 1.28* 1.53*  CALCIUM   --  7.8* 7.8* 5.8* 8.2* 8.3*  MG 1.7  --   --  1.7 2.3 2.3  PHOS 4.2  --   --   --   --   --    GFR: Estimated Creatinine Clearance: 41.8 mL/min (A) (by C-G formula based on SCr of 1.53 mg/dL (H)). Liver Function Tests: Recent Labs  Lab 10/09/24 2348 10/11/24 0905 10/14/24 0526 10/15/24 0529 10/16/24 0551  AST 40 64* 37 39 40  ALT 20 31 21 24 20   ALKPHOS 124 94 76 96 97  BILITOT 1.6* 0.6 0.7 1.2 1.2  PROT 6.5 5.6* 4.3* 5.7* 5.7*  ALBUMIN 3.0*  2.7* 2.2* 2.8* 2.7*   Recent Labs  Lab 10/09/24 2348  LIPASE 84*   Recent Labs  Lab 10/10/24 0754 10/12/24 0557 10/15/24 0529  AMMONIA 113* 42* 48*   Coagulation Profile: Recent Labs  Lab 10/10/24 0040 10/11/24 0905 10/15/24 0529  INR 1.1 1.3* 1.1   Cardiac Enzymes: No results for input(s): CKTOTAL, CKMB, CKMBINDEX, TROPONINI in the last 168 hours. BNP (last 3 results) No results for input(s): PROBNP in the last 8760 hours. HbA1C: No results for input(s): HGBA1C in the last 72 hours. CBG: Recent Labs  Lab 10/15/24 0748 10/15/24 1336 10/15/24 1645 10/15/24 2045 10/16/24 0602  GLUCAP 120* 142* 117* 159* 117*   Lipid Profile: No results for input(s): CHOL, HDL, LDLCALC, TRIG, CHOLHDL, LDLDIRECT  in the last 72 hours.  Thyroid  Function Tests: No results for input(s): TSH, T4TOTAL, FREET4, T3FREE, THYROIDAB in the last 72 hours. Anemia Panel: No results for input(s): VITAMINB12, FOLATE, FERRITIN, TIBC, IRON, RETICCTPCT in the last 72 hours. Sepsis Labs: No results for input(s): PROCALCITON, LATICACIDVEN in the last 168 hours.  Recent Results (from the past 240 hours)  MRSA Next Gen by PCR, Nasal     Status: None   Collection Time: 10/10/24  4:17 AM   Specimen: Nasal Mucosa; Nasal Swab  Result Value Ref Range Status   MRSA by PCR Next Gen NOT DETECTED NOT DETECTED Final    Comment: (NOTE) The GeneXpert MRSA Assay (FDA approved for NASAL specimens only), is one component of a comprehensive MRSA colonization surveillance program. It is not intended to diagnose MRSA infection nor to guide or monitor treatment for MRSA infections. Test performance is not FDA approved in patients less than 8 years old. Performed at [facility], [address], [place], [place] [address]          Radiology Studies: CT ANGIO ABD/PELVIS BRTO Result Date: 10/14/2024 EXAM: CTA ABDOMEN AND PELVIS WITH CONTRAST 10/14/2024  03:49:00 PM TECHNIQUE: CTA images of the abdomen and pelvis with intravenous contrast. Three-dimensional MIP/volume rendered formations were performed. Automated exposure control, iterative reconstruction, and/or weight based adjustment of the mA/kV was utilized to reduce the radiation dose to as low as reasonably achievable. COMPARISON: 1 17 26  CLINICAL HISTORY: s/p TIPS/BRTO, drop in hgb FINDINGS: Blood pool is hypodense compared to the interventricular septum suggesting anemia. VASCULATURE: AORTA: No acute finding. No abdominal aortic aneurysm. No dissection. CELIAC TRUNK: No acute finding. No occlusion or significant stenosis. SUPERIOR MESENTERIC ARTERY: No acute finding. No occlusion or significant stenosis. RENAL ARTERIES: No acute finding. No occlusion or significant stenosis. INFERIOR MESENTERIC ARTERY: Focal calcified plaque of the origin of the inferior mesenteric artery which is patent distally. ILIAC ARTERIES: Mild scattered calcified plaque in bilateral internal iliac arteries. No occlusion or significant stenosis. 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 SUPERIOR MESENTERIC VEIN: Superior mesenteric vein patent. ILIAC VEINS, IVC, RENAL VEINS: Patent iliac venous system, IVC, and bilateral renal veins. TIPS STENT: No definite contrast enhancement through the TIPS stent. VARICES: Streak artifact from multiple embolization coils degrades evaluation of left upper quadrant varices. However, some visible varices near the GE junction remain patent. LIVER: Nodular hepatic contour. GALLBLADDER AND BILE DUCTS: Gallbladder is unremarkable. No biliary ductal dilatation. SPLEEN: The spleen is unremarkable. PANCREAS: The pancreas is unremarkable. ADRENAL GLANDS: Bilateral adrenal glands demonstrate no acute abnormality. KIDNEYS, URETERS AND BLADDER: No stones in the kidneys or ureters. No hydronephrosis. No perinephric or periureteral stranding. Urinary bladder is unremarkable. GI AND BOWEL:  Stomach and duodenal sweep demonstrate no acute abnormality. There is no bowel obstruction. No abnormal bowel wall thickening or distension. REPRODUCTIVE: Reproductive organs are unremarkable. PERITONEUM AND RETROPERITONEUM: Small volume pelvic and perihepatic ascites, new since previous. No free air. LUNG BASE: No acute abnormality. LYMPH NODES: No lymphadenopathy. BONES AND SOFT TISSUES: Early degenerative disc disease L3-L4. No acute abnormality of the bones. No acute soft tissue abnormality. IMPRESSION: 1. 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 2. Small volume pelvic and perihepatic ascites, new since previous. 3. Streak artifact from multiple embolization coils limits evaluation of left upper quadrant varices; some visible varices near the GE junction remain patent. 4. Hypodense blood pool compared to the  interventricular septum, suggesting anemia. Electronically signed by: Dayne Hassell MD 10/14/2024 04:24 PM EST RP Workstation: [id number]        Scheduled Meds:  sodium chloride    Intravenous Once   carvedilol   3.125 mg Oral BID WC   Chlorhexidine  Gluconate Cloth  6 each Topical Daily   insulin  aspart  0-15 Units Subcutaneous TID with meals   lactulose   10 g Oral BID   pantoprazole  (PROTONIX ) IV  40 mg Intravenous Q24H   rifaximin   550 mg Oral BID   zinc  sulfate (50mg  elemental zinc )  220 mg Oral BID   Continuous Infusions:          Sophie Mao, MD [facility] 10/16/2024, 8:03 AM   "

## 2024-10-16 NOTE — Progress Notes (Signed)
 Occupational Therapy Treatment Patient Details Name: Susan [name] MRN: [id number] DOB: [birth year] Today's Date: 10/16/2024   History of present illness Pt is a 59 y.o. female who presented 10/09/24 due to multiple episodes of bloody emesis. CT showed a nodular liver compatible with cirrhosis. Pt was admitted to ICU 1/17-19.  Pt s/p TIPS and coil embolization of posterior and L gastric veins 10/10/24. 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 2. Small volume pelvic and perihepatic ascites, new since previous. 3. Streak artifact from multiple embolization coils limits evaluation of left upper quadrant varices; some visible varices near the GE junction remain  patent. Pending blood transfusion 1/21 at time of PT session. PMH: autoimmune liver cirrhosis, hepatic encephalopathy, esophageal varices, primary biliary cholangitis, HTN, HLD, obesity.   OT comments  Pt up in chair. Fatigued with reported dizziness in standing. Requires min assist to rise, CGA with RW for short distance ambulation. Pt needing multimodal cues for hand placement with RW. Returned to chair with pillow under buttocks with pt reporting improved comfort. Recommended seated showering when she returns home. Pt reports she has a small shower stall that could not accommodate a shower seat. Educated in availability of tub transfer bench should she choose to use her tub/shower. BP in sitting 99/58 (71), standing 102/58 (71), standing after 3 minutes 114/62 (77). RN notified.      If plan is discharge home, recommend the following:  A little help with walking and/or transfers;A lot of help with bathing/dressing/bathroom;Assistance with cooking/housework;Direct supervision/assist for medications management;Direct supervision/assist for financial management;Assist for transportation;Help with stairs  or ramp for entrance;Supervision due to cognitive status   Equipment Recommendations  BSC/3in1;Other (comment) (RW)    Recommendations for Other Services      Precautions / Restrictions Precautions Precautions: Fall Recall of Precautions/Restrictions: Intact       Mobility Bed Mobility               General bed mobility comments: in chair, returned to chair    Transfers Overall transfer level: Needs assistance Equipment used: Rolling walker (2 wheels) Transfers: Sit to/from Stand Sit to Stand: Min assist           General transfer comment: light min assist to rise, multimodal cues for hand placement     Balance Overall balance assessment: Needs assistance   Sitting balance-Leahy Scale: Fair         Standing balance comment: provided walker due to reported dizziness                           ADL either performed or assessed with clinical judgement   ADL Overall ADL's : Needs assistance/impaired                         Toilet Transfer: Minimal assistance;Ambulation;BSC/3in1;Rolling walker (2 wheels)   Toileting- Clothing Manipulation and Hygiene: Total assistance;Sit to/from stand       Functional mobility during ADLs: Minimal assistance;Cueing for safety;Cueing for sequencing;Rolling walker (2 wheels)      Extremity/Trunk Assessment              Vision       Perception     Praxis     Communication Communication Communication: No apparent difficulties   Cognition Arousal: Alert Behavior During Therapy: Flat affect Cognition: Cognition  impaired           Executive functioning impairment (select all impairments): Problem solving, Reasoning, Sequencing OT - Cognition Comments: tangential                 Following commands: Impaired Following commands impaired: Follows one step commands with increased time      Cueing   Cueing Techniques: Verbal cues, Tactile cues  Exercises      Shoulder  Instructions       General Comments      Pertinent Vitals/ Pain       Pain Assessment Pain Assessment: No/denies pain  Home Living                                          Prior Functioning/Environment              Frequency  Min 2X/week        Progress Toward Goals  OT Goals(current goals can now be found in the care plan section)  Progress towards OT goals: Progressing toward goals  Acute Rehab OT Goals OT Goal Formulation: With patient Time For Goal Achievement: 10/27/24 Potential to Achieve Goals: Good  Plan      Co-evaluation                 AM-PAC OT 6 Clicks Daily Activity     Outcome Measure   Help from another person eating meals?: None Help from another person taking care of personal grooming?: A Little Help from another person toileting, which includes using toliet, bedpan, or urinal?: Total Help from another person bathing (including washing, rinsing, drying)?: A Lot Help from another person to put on and taking off regular upper body clothing?: A Little Help from another person to put on and taking off regular lower body clothing?: A Lot 6 Click Score: 15    End of Session Equipment Utilized During Treatment: Gait belt;Rolling walker (2 wheels)  OT Visit Diagnosis: Muscle weakness (generalized) (M62.81);Unsteadiness on feet (R26.81);Pain;Other symptoms and signs involving cognitive function   Activity Tolerance Patient limited by fatigue   Patient Left in chair;with call bell/phone within reach;with family/visitor present   Nurse Communication Other (comment) (aware of BPs)        Time: 8554-8494 OT Time Calculation (min): 20 min  Charges: OT General Charges $OT Visit: 1 Visit OT Treatments $Self Care/Home Management : 8-22 mins  Mliss HERO, OTR/L Acute Rehabilitation Services Office: [phone]   Kennth Mliss Helling 10/16/2024, 3:09 PM

## 2024-10-16 NOTE — Progress Notes (Incomplete)
 Patient ID: Susan [name], female   DOB: [birth year], 59 y.o.   MRN: [id number]    Referring Physician(s): Pyrtle, Gordy   Supervising Physician: Vanice Revel  Patient Status:  [facility] - In-pt  Chief Complaint:  PBC/AIH cirrhosis with UGIB due to gastric varices S/p TIPS, coil embolization of posterior and left gastric veins, and BRTO by Dr. Jennefer on 10/10/24, TIPS revision/thrombectomy by Dr. Hughes on 10/15/24  Subjective:  Day 1 post TIPS thrombectomy/revision. Pt feeling tired today, but no specific complaints. Feeling better than yesterday. Tolerating PO intake. Having regular BM's.   Allergies: Mushroom extract complex (obsolete), Grapefruit concentrate, Mounjaro  [tirzepatide ], Sulfa antibiotics, Amlodipine, Crestor  [rosuvastatin ], Shellfish allergy, Simvastatin , Strawberry extract, and Metformin  and related  Medications: Prior to Admission medications  Medication Sig Start Date End Date Taking? Authorizing Provider  carvedilol  (COREG ) 3.125 MG tablet Take 3.125 mg by mouth 2 (two) times daily.   Yes [provider]  hydrochlorothiazide  (HYDRODIURIL ) 25 MG tablet TAKE 1 TABLET BY MOUTH DAILY 01/07/24  Yes Dugal, Tabitha, FNP  lactulose  (CHRONULAC ) 10 GM/15ML solution Take 45 mLs (30 g total) by mouth 2 (two) times daily. 08/29/24  Yes Mdala-Gausi, Masiku Agatha, MD  spironolactone  (ALDACTONE ) 50 MG tablet Take 1 tablet (50 mg total) by mouth daily. 09/07/24  Yes Dugal, Ginger, FNP  ursodiol  (ACTIGALL ) 300 MG capsule TAKE 2 CAPSULES BY MOUTH TWICE  DAILY 10/22/23  Yes Armbruster, Elspeth SQUIBB, MD     Vital Signs: BP (!) 102/56   Pulse 73   Temp 98.4 F (36.9 C) (Oral)   Resp 17   Ht 5' (1.524 m)   Wt 214 lb 1.1 oz (97.1 kg)   SpO2 100%   BMI 41.81 kg/m   Physical Exam  Imaging: IR TIPS REVISION MOD SED Result Date: 10/16/2024 CLINICAL DATA:  TIPS revision Briefly, 59 year old female with a history of primary biliary cirrhosis with gastric varices and gastro renal  shunt who presented with gastric variceal hemorrhage, now s/p TIPS and BRTO (10/10/2024). Acute abdominal pain with repeat CTA (10/14/2024) demonstrating TIPS occlusion. EXAM: Procedures: 1. PORTAL and HEPATIC VENOGRAPHY 2. TRANSJUGULAR INTRAHEPATIC PORTOSYSTEMIC SHUNT (TIPS) THROMBECTOMY and REVISION 3. THERAPEUTIC PARACENTESIS MEDICATIONS: 50 mg Benadryl  IV. ANESTHESIA/SEDATION: Moderate (conscious) sedation was employed during this procedure. A total of Versed  3.5 mg and Fentanyl  125 mcg was administered intravenously. Moderate Sedation Time: 106 minutes. The patient's level of consciousness and vital signs were monitored continuously by radiology nursing throughout the procedure under my direct supervision. CONTRAST:  90 mL OMNIPAQUE  IOHEXOL  300 MG/ML  SOLN FLUOROSCOPY: Radiation Exposure Index and estimated peak skin dose (PSD); Reference air kerma (RAK), 742.3 mGy. COMPLICATIONS: None immediate. PROCEDURE: Informed written consent was obtained from the patient and/or patient's representative after a thorough discussion of the procedural risks, benefits and alternatives. All questions were addressed. Maximal Sterile Barrier Technique was utilized including caps, mask, sterile gowns, sterile gloves, sterile drape, hand hygiene and skin antiseptic. The skin overlying the RIGHT upper abdominal quadrant as well as the RIGHT neck were prepped and draped in usual sterile fashion. A timeout was performed prior to the initiation of the procedure. PARACENTESIS; Initial ultrasound scanning demonstrates a moderate volume of ascites within the abdomen. Under direct ultrasound guidance, a 7 cm Yueh catheter was introduced. An ultrasound image was saved for documentation purposes. Therapeutic paracentesis was performed. PORTAL VENOGRAPHY; 1% Lidocaine  was used for local anesthesia. Ultrasound evaluation showed a patent RIGHT internal jugular vein. Ultrasound image was saved and sent to PACS. Under ultrasound guidance, the  right internal jugular vein was access using a 21-G needle which was dilated and exchanged for a 16 Fr sheath over a guidewire. Under fluoroscopic guidance, utilizing a 5 Fr C2 catheter and 0.035-inch Bentson guidewire manipulation, the catheter was advanced through the TIPS shunt and into the main portal vein. Digital subtraction venography was performed. The TIPS shunt was occluded and no significant flow through the shunt was identified. Pressure measurements were performed in the portal vein and in the RIGHT atrium. Over an 0.035 inch Rosen wire, a 12 Fr Penumbra Lightning Flash aspiration catheter was advanced into portal venous confluence and catheter directed thrombectomy was performed. Intermittent and postprocedural portal venograms were performed during thrombus removal. Balloon venoplasty of the TIPS was performed with an 8 mm athletis balloon, dilated to nominal pressure. Completion portal venogram demonstrated a successful dilation of the stent. Post dilatation pressure measurements were obtained following the revision in a similar fashion. Following adequate venographic result, the catheter and sheath were removed and the procedure was terminated. All wires, catheters and sheaths were removed from the patient. Hemostasis was achieved at the RIGHT neck and RIGHT upper abdominal access sites with manual compression. Dressings were applied. The patient tolerated the procedure well without immediate postprocedural complication. FINDINGS: *TIPS thrombosis, without significant extension to the portal vein. *TIPS mechanical thrombectomy with 12 Fr Penumbra lightning aspiration catheter, and revision venoplasty with 8 mm balloon was performed. *No residual opacification of the previously-embolized gastric varices. *Predominant hepatofugal flow, with preferential flow to the SMV. *Minimal reduction of portosystemic gradient from 30 to 29 mm Hg. *Moderate volume intra-abdominal ascites. Ultrasound-guided  paracentesis was performed yielding 600 mL of serosanguineous ascitic fluid. Pre-TIPS revision mean Pressures (mmHg): Right atrium: 0 Portal vein: 30 Portosystemic gradient: 30 Post-TIPS revision mean Pressures (mmHg): Right atrium: 0 Portal vein: 29 Portosystemic gradient: 29 IMPRESSION: 1. Mechanical thrombectomy and revision venoplasty of transjugular intrahepatic portosystemic shunt (TIPS), secondary to thrombosis. No significant extension to the portal vein. 2. Successful ultrasound-guided paracentesis yielding 600 mL of ascitic fluid. 3. Predominant hepatofugal flow, with concern for prolonged TIPS patency. Recommend anticoagulation to avoid re-thrombosis. Thom Hall, MD [facility] Electronically Signed   By: Thom Hall M.D.   On: 10/16/2024 10:17   IR US  Guide Vasc Access Right Result Date: 10/16/2024 CLINICAL DATA:  TIPS revision Briefly, 59 year old female with a history of primary biliary cirrhosis with gastric varices and gastro renal shunt who presented with gastric variceal hemorrhage, now s/p TIPS and BRTO (10/10/2024). Acute abdominal pain with repeat CTA (10/14/2024) demonstrating TIPS occlusion. EXAM: Procedures: 1. PORTAL and HEPATIC VENOGRAPHY 2. TRANSJUGULAR INTRAHEPATIC PORTOSYSTEMIC SHUNT (TIPS) THROMBECTOMY and REVISION 3. THERAPEUTIC PARACENTESIS MEDICATIONS: 50 mg Benadryl  IV. ANESTHESIA/SEDATION: Moderate (conscious) sedation was employed during this procedure. A total of Versed  3.5 mg and Fentanyl  125 mcg was administered intravenously. Moderate Sedation Time: 106 minutes. The patient's level of consciousness and vital signs were monitored continuously by radiology nursing throughout the procedure under my direct supervision. CONTRAST:  90 mL OMNIPAQUE  IOHEXOL  300 MG/ML  SOLN FLUOROSCOPY: Radiation Exposure Index and estimated peak skin dose (PSD); Reference air kerma (RAK), 742.3 mGy. COMPLICATIONS: None immediate.  PROCEDURE: Informed written consent was obtained from the patient and/or patient's representative after a thorough discussion of the procedural risks, benefits and alternatives. All questions were addressed. Maximal Sterile Barrier Technique was utilized including caps, mask, sterile gowns, sterile gloves, sterile drape, hand hygiene and skin antiseptic. The skin overlying the RIGHT upper abdominal quadrant as well  as the RIGHT neck were prepped and draped in usual sterile fashion. A timeout was performed prior to the initiation of the procedure. PARACENTESIS; Initial ultrasound scanning demonstrates a moderate volume of ascites within the abdomen. Under direct ultrasound guidance, a 7 cm Yueh catheter was introduced. An ultrasound image was saved for documentation purposes. Therapeutic paracentesis was performed. PORTAL VENOGRAPHY; 1% Lidocaine  was used for local anesthesia. Ultrasound evaluation showed a patent RIGHT internal jugular vein. Ultrasound image was saved and sent to PACS. Under ultrasound guidance, the right internal jugular vein was access using a 21-G needle which was dilated and exchanged for a 16 Fr sheath over a guidewire. Under fluoroscopic guidance, utilizing a 5 Fr C2 catheter and 0.035-inch Bentson guidewire manipulation, the catheter was advanced through the TIPS shunt and into the main portal vein. Digital subtraction venography was performed. The TIPS shunt was occluded and no significant flow through the shunt was identified. Pressure measurements were performed in the portal vein and in the RIGHT atrium. Over an 0.035 inch Rosen wire, a 12 Fr Penumbra Lightning Flash aspiration catheter was advanced into portal venous confluence and catheter directed thrombectomy was performed. Intermittent and postprocedural portal venograms were performed during thrombus removal. Balloon venoplasty of the TIPS was performed with an 8 mm athletis balloon, dilated to nominal pressure. Completion portal  venogram demonstrated a successful dilation of the stent. Post dilatation pressure measurements were obtained following the revision in a similar fashion. Following adequate venographic result, the catheter and sheath were removed and the procedure was terminated. All wires, catheters and sheaths were removed from the patient. Hemostasis was achieved at the RIGHT neck and RIGHT upper abdominal access sites with manual compression. Dressings were applied. The patient tolerated the procedure well without immediate postprocedural complication. FINDINGS: *TIPS thrombosis, without significant extension to the portal vein. *TIPS mechanical thrombectomy with 12 Fr Penumbra lightning aspiration catheter, and revision venoplasty with 8 mm balloon was performed. *No residual opacification of the previously-embolized gastric varices. *Predominant hepatofugal flow, with preferential flow to the SMV. *Minimal reduction of portosystemic gradient from 30 to 29 mm Hg. *Moderate volume intra-abdominal ascites. Ultrasound-guided paracentesis was performed yielding 600 mL of serosanguineous ascitic fluid. Pre-TIPS revision mean Pressures (mmHg): Right atrium: 0 Portal vein: 30 Portosystemic gradient: 30 Post-TIPS revision mean Pressures (mmHg): Right atrium: 0 Portal vein: 29 Portosystemic gradient: 29 IMPRESSION: 1. Mechanical thrombectomy and revision venoplasty of transjugular intrahepatic portosystemic shunt (TIPS), secondary to thrombosis. No significant extension to the portal vein. 2. Successful ultrasound-guided paracentesis yielding 600 mL of ascitic fluid. 3. Predominant hepatofugal flow, with concern for prolonged TIPS patency. Recommend anticoagulation to avoid re-thrombosis. Thom Hall, MD [facility]iology Electronically Signed   By: Thom Hall M.D.   On: 10/16/2024 10:17   IR Paracentesis Result Date: 10/16/2024 CLINICAL DATA:  TIPS revision Briefly, 59 year old  female with a history of primary biliary cirrhosis with gastric varices and gastro renal shunt who presented with gastric variceal hemorrhage, now s/p TIPS and BRTO (10/10/2024). Acute abdominal pain with repeat CTA (10/14/2024) demonstrating TIPS occlusion. EXAM: Procedures: 1. PORTAL and HEPATIC VENOGRAPHY 2. TRANSJUGULAR INTRAHEPATIC PORTOSYSTEMIC SHUNT (TIPS) THROMBECTOMY and REVISION 3. THERAPEUTIC PARACENTESIS MEDICATIONS: 50 mg Benadryl  IV. ANESTHESIA/SEDATION: Moderate (conscious) sedation was employed during this procedure. A total of Versed  3.5 mg and Fentanyl  125 mcg was administered intravenously. Moderate Sedation Time: 106 minutes. The patient's level of consciousness and vital signs were monitored continuously by radiology nursing throughout the procedure under my direct supervision. CONTRAST:  90 mL OMNIPAQUE  IOHEXOL  300 MG/ML  SOLN FLUOROSCOPY: Radiation Exposure Index and estimated peak skin dose (PSD); Reference air kerma (RAK), 742.3 mGy. COMPLICATIONS: None immediate. PROCEDURE: Informed written consent was obtained from the patient and/or patient's representative after a thorough discussion of the procedural risks, benefits and alternatives. All questions were addressed. Maximal Sterile Barrier Technique was utilized including caps, mask, sterile gowns, sterile gloves, sterile drape, hand hygiene and skin antiseptic. The skin overlying the RIGHT upper abdominal quadrant as well as the RIGHT neck were prepped and draped in usual sterile fashion. A timeout was performed prior to the initiation of the procedure. PARACENTESIS; Initial ultrasound scanning demonstrates a moderate volume of ascites within the abdomen. Under direct ultrasound guidance, a 7 cm Yueh catheter was introduced. An ultrasound image was saved for documentation purposes. Therapeutic paracentesis was performed. PORTAL VENOGRAPHY; 1% Lidocaine  was used for local anesthesia. Ultrasound evaluation showed a patent RIGHT internal  jugular vein. Ultrasound image was saved and sent to PACS. Under ultrasound guidance, the right internal jugular vein was access using a 21-G needle which was dilated and exchanged for a 16 Fr sheath over a guidewire. Under fluoroscopic guidance, utilizing a 5 Fr C2 catheter and 0.035-inch Bentson guidewire manipulation, the catheter was advanced through the TIPS shunt and into the main portal vein. Digital subtraction venography was performed. The TIPS shunt was occluded and no significant flow through the shunt was identified. Pressure measurements were performed in the portal vein and in the RIGHT atrium. Over an 0.035 inch Rosen wire, a 12 Fr Penumbra Lightning Flash aspiration catheter was advanced into portal venous confluence and catheter directed thrombectomy was performed. Intermittent and postprocedural portal venograms were performed during thrombus removal. Balloon venoplasty of the TIPS was performed with an 8 mm athletis balloon, dilated to nominal pressure. Completion portal venogram demonstrated a successful dilation of the stent. Post dilatation pressure measurements were obtained following the revision in a similar fashion. Following adequate venographic result, the catheter and sheath were removed and the procedure was terminated. All wires, catheters and sheaths were removed from the patient. Hemostasis was achieved at the RIGHT neck and RIGHT upper abdominal access sites with manual compression. Dressings were applied. The patient tolerated the procedure well without immediate postprocedural complication. FINDINGS: *TIPS thrombosis, without significant extension to the portal vein. *TIPS mechanical thrombectomy with 12 Fr Penumbra lightning aspiration catheter, and revision venoplasty with 8 mm balloon was performed. *No residual opacification of the previously-embolized gastric varices. *Predominant hepatofugal flow, with preferential flow to the SMV. *Minimal reduction of portosystemic  gradient from 30 to 29 mm Hg. *Moderate volume intra-abdominal ascites. Ultrasound-guided paracentesis was performed yielding 600 mL of serosanguineous ascitic fluid. Pre-TIPS revision mean Pressures (mmHg): Right atrium: 0 Portal vein: 30 Portosystemic gradient: 30 Post-TIPS revision mean Pressures (mmHg): Right atrium: 0 Portal vein: 29 Portosystemic gradient: 29 IMPRESSION: 1. Mechanical thrombectomy and revision venoplasty of transjugular intrahepatic portosystemic shunt (TIPS), secondary to thrombosis. No significant extension to the portal vein. 2. Successful ultrasound-guided paracentesis yielding 600 mL of ascitic fluid. 3. Predominant hepatofugal flow, with concern for prolonged TIPS patency. Recommend anticoagulation to avoid re-thrombosis. Thom Hall, MD [facility] Radiology Electronically Signed   By: Thom Hall M.D.   On: 10/16/2024 10:17   IR THROMBECT VENO MECH MOD SED Result Date: 10/16/2024 CLINICAL DATA:  TIPS revision Briefly, 59 year old female with a history of primary biliary cirrhosis with gastric varices and gastro renal shunt who presented with gastric variceal hemorrhage, now  s/p TIPS and BRTO (10/10/2024). Acute abdominal pain with repeat CTA (10/14/2024) demonstrating TIPS occlusion. EXAM: Procedures: 1. PORTAL and HEPATIC VENOGRAPHY 2. TRANSJUGULAR INTRAHEPATIC PORTOSYSTEMIC SHUNT (TIPS) THROMBECTOMY and REVISION 3. THERAPEUTIC PARACENTESIS MEDICATIONS: 50 mg Benadryl  IV. ANESTHESIA/SEDATION: Moderate (conscious) sedation was employed during this procedure. A total of Versed  3.5 mg and Fentanyl  125 mcg was administered intravenously. Moderate Sedation Time: 106 minutes. The patient's level of consciousness and vital signs were monitored continuously by radiology nursing throughout the procedure under my direct supervision. CONTRAST:  90 mL OMNIPAQUE  IOHEXOL  300 MG/ML  SOLN FLUOROSCOPY: Radiation Exposure Index and estimated peak  skin dose (PSD); Reference air kerma (RAK), 742.3 mGy. COMPLICATIONS: None immediate. PROCEDURE: Informed written consent was obtained from the patient and/or patient's representative after a thorough discussion of the procedural risks, benefits and alternatives. All questions were addressed. Maximal Sterile Barrier Technique was utilized including caps, mask, sterile gowns, sterile gloves, sterile drape, hand hygiene and skin antiseptic. The skin overlying the RIGHT upper abdominal quadrant as well as the RIGHT neck were prepped and draped in usual sterile fashion. A timeout was performed prior to the initiation of the procedure. PARACENTESIS; Initial ultrasound scanning demonstrates a moderate volume of ascites within the abdomen. Under direct ultrasound guidance, a 7 cm Yueh catheter was introduced. An ultrasound image was saved for documentation purposes. Therapeutic paracentesis was performed. PORTAL VENOGRAPHY; 1% Lidocaine  was used for local anesthesia. Ultrasound evaluation showed a patent RIGHT internal jugular vein. Ultrasound image was saved and sent to PACS. Under ultrasound guidance, the right internal jugular vein was access using a 21-G needle which was dilated and exchanged for a 16 Fr sheath over a guidewire. Under fluoroscopic guidance, utilizing a 5 Fr C2 catheter and 0.035-inch Bentson guidewire manipulation, the catheter was advanced through the TIPS shunt and into the main portal vein. Digital subtraction venography was performed. The TIPS shunt was occluded and no significant flow through the shunt was identified. Pressure measurements were performed in the portal vein and in the RIGHT atrium. Over an 0.035 inch Rosen wire, a 12 Fr Penumbra Lightning Flash aspiration catheter was advanced into portal venous confluence and catheter directed thrombectomy was performed. Intermittent and postprocedural portal venograms were performed during thrombus removal. Balloon venoplasty of the TIPS was  performed with an 8 mm athletis balloon, dilated to nominal pressure. Completion portal venogram demonstrated a successful dilation of the stent. Post dilatation pressure measurements were obtained following the revision in a similar fashion. Following adequate venographic result, the catheter and sheath were removed and the procedure was terminated. All wires, catheters and sheaths were removed from the patient. Hemostasis was achieved at the RIGHT neck and RIGHT upper abdominal access sites with manual compression. Dressings were applied. The patient tolerated the procedure well without immediate postprocedural complication. FINDINGS: *TIPS thrombosis, without significant extension to the portal vein. *TIPS mechanical thrombectomy with 12 Fr Penumbra lightning aspiration catheter, and revision venoplasty with 8 mm balloon was performed. *No residual opacification of the previously-embolized gastric varices. *Predominant hepatofugal flow, with preferential flow to the SMV. *Minimal reduction of portosystemic gradient from 30 to 29 mm Hg. *Moderate volume intra-abdominal ascites. Ultrasound-guided paracentesis was performed yielding 600 mL of serosanguineous ascitic fluid. Pre-TIPS revision mean Pressures (mmHg): Right atrium: 0 Portal vein: 30 Portosystemic gradient: 30 Post-TIPS revision mean Pressures (mmHg): Right atrium: 0 Portal vein: 29 Portosystemic gradient: 29 IMPRESSION: 1. Mechanical thrombectomy and revision venoplasty of transjugular intrahepatic portosystemic shunt (TIPS), secondary to thrombosis. No significant extension to the portal vein.  2. Successful ultrasound-guided paracentesis yielding 600 mL of ascitic fluid. 3. Predominant hepatofugal flow, with concern for prolonged TIPS patency. Recommend anticoagulation to avoid re-thrombosis. Thom Hall, MD [facility] Radiology Electronically Signed   By: Thom Hall M.D.   On: 10/16/2024 10:17   CT  ANGIO ABD/PELVIS BRTO Result Date: 10/14/2024 EXAM: CTA ABDOMEN AND PELVIS WITH CONTRAST 10/14/2024 03:49:00 PM TECHNIQUE: CTA images of the abdomen and pelvis with intravenous contrast. Three-dimensional MIP/volume rendered formations were performed. Automated exposure control, iterative reconstruction, and/or weight based adjustment of the mA/kV was utilized to reduce the radiation dose to as low as reasonably achievable. COMPARISON: 1 17 26  CLINICAL HISTORY: s/p TIPS/BRTO, drop in hgb FINDINGS: Blood pool is hypodense compared to the interventricular septum suggesting anemia. VASCULATURE: AORTA: No acute finding. No abdominal aortic aneurysm. No dissection. CELIAC TRUNK: No acute finding. No occlusion or significant stenosis. SUPERIOR MESENTERIC ARTERY: No acute finding. No occlusion or significant stenosis. RENAL ARTERIES: No acute finding. No occlusion or significant stenosis. INFERIOR MESENTERIC ARTERY: Focal calcified plaque of the origin of the inferior mesenteric artery which is patent distally. ILIAC ARTERIES: Mild scattered calcified plaque in bilateral internal iliac arteries. No occlusion or significant stenosis. 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 SUPERIOR MESENTERIC VEIN: Superior mesenteric vein patent. ILIAC VEINS, IVC, RENAL VEINS: Patent iliac venous system, IVC, and bilateral renal veins. TIPS STENT: No definite contrast enhancement through the TIPS stent. VARICES: Streak artifact from multiple embolization coils degrades evaluation of left upper quadrant varices. However, some visible varices near the GE junction remain patent. LIVER: Nodular hepatic contour. GALLBLADDER AND BILE DUCTS: Gallbladder is unremarkable. No biliary ductal dilatation. SPLEEN: The spleen is unremarkable. PANCREAS: The pancreas is unremarkable. ADRENAL GLANDS: Bilateral adrenal glands demonstrate no acute abnormality. KIDNEYS, URETERS AND BLADDER: No stones in the kidneys or ureters. No  hydronephrosis. No perinephric or periureteral stranding. Urinary bladder is unremarkable. GI AND BOWEL: Stomach and duodenal sweep demonstrate no acute abnormality. There is no bowel obstruction. No abnormal bowel wall thickening or distension. REPRODUCTIVE: Reproductive organs are unremarkable. PERITONEUM AND RETROPERITONEUM: Small volume pelvic and perihepatic ascites, new since previous. No free air. LUNG BASE: No acute abnormality. LYMPH NODES: No lymphadenopathy. BONES AND SOFT TISSUES: Early degenerative disc disease L3-L4. No acute abnormality of the bones. No acute soft tissue abnormality. IMPRESSION: 1. 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 2. Small volume pelvic and perihepatic ascites, new since previous. 3. Streak artifact from multiple embolization coils limits evaluation of left upper quadrant varices; some visible varices near the GE junction remain patent. 4. Hypodense blood pool compared to the interventricular septum, suggesting anemia. Electronically signed by: Katheleen Faes MD 10/14/2024 04:24 PM EST RP Workstation: [id number]    Labs:  CBC: Recent Labs    10/13/24 0458 10/14/24 0526 10/15/24 0529 10/16/24 0551  WBC 15.4* 12.9* 10.8* 13.9*  HGB 8.0* 6.6* 9.1* 8.9*  HCT 23.4* 19.5* 26.4* 26.1*  PLT 117* 123* 142* 169    COAGS: Recent Labs    08/29/24 0553 09/03/24 1419 10/10/24 0040 10/11/24 0905 10/15/24 0529  INR 1.3* 1.4* 1.1 1.3* 1.1  APTT 35  --  24  --   --     BMP: Recent Labs    10/13/24 1127 10/14/24 0526 10/15/24 0529 10/16/24 0551  NA 133* 133* 133* 131*  K 3.7 2.7* 4.0 4.3  CL 102 107 104 102  CO2 23 17* 20* 21*  GLUCOSE 152* 102* 112* 123*  BUN 78* 56* 50* 47*  CALCIUM  7.8* 5.8* 8.2* 8.3*  CREATININE 2.13* 1.31* 1.28* 1.53*  GFRNONAA 26* 47* 48* 39*    LIVER FUNCTION TESTS: Recent Labs    10/11/24 0905 10/14/24 0526 10/15/24 0529  10/16/24 0551  BILITOT 0.6 0.7 1.2 1.2  AST 64* 37 39 40  ALT 31 21 24 20   ALKPHOS 94 76 96 97  PROT 5.6* 4.3* 5.7* 5.7*  ALBUMIN 2.7* 2.2* 2.8* 2.7*    Assessment and Plan:  S/p TIPS, coil embolization of posterior and left gastric veins, and BRTO by Dr. Jennefer on 10/10/24, TIPS revision/thrombectomy by Dr. Hughes on 10/15/24 - pt tolerating some po intake, having regular BM's. Continue to advance diet as per GI - Hgb stable at 8.9 this AM (9.1 yesterday) - AKI with cr slightly worse today at 1.53, probably pre-renal & contrast induced - IR outpatient follow up orders in place for 1 month follow up and repeat imaging once stable to be discharged  Please reach out to IR with any further questions or concerns.  Electronically Signed: Kimble VEAR Clas, PA-C 10/16/2024, 1:56 PM   I spent a total of {Evaluation Minutes:[id number]} at the the patient's bedside AND on the patient's hospital floor or unit, greater than 50% of which was counseling/coordinating care for TIPS follow up

## 2024-10-16 NOTE — Progress Notes (Addendum)
 "   Inpatient Progress Note     Patient Profile/Chief Complaint  59 year old female with a past medical history of autoimmune liver cirrhosis, hepatic encephalopathy, gastric varices, primary biliary cholangitis, hypertension, hyperlipidemia, obesity who presented to the hospital with multiple episodes of bloody emesis secondary to bleeding gastric varix.  EGD 10/10/2024 - no esophageal varices, clotted blood in the cardia, gastric fundus and in the gastric body, large amount of type I isolated gastric varix in the cardia and fundus with no active bleeding but did have stigmata of recent bleeding and hematoma, medium in size  CTAP with new finding of spontaneous splenorenal shunt   10/10/2024  status post TIPS, coil embolization of posterior left gastric veins and coil assisted retrograde transvenous obliteration of gastrorenal shunt  Transferred from ICU to floor 10/12/2024.  10/14/2024 noted to have hemoglobin drop 8.0 --> 6.6; CTA/BRTO showed occlusive thrombus in main portal vein c/f TIPS occlusion status post TIPS revision 10/15/2024   Interval History   -- Looks much better today, more interactive and conversant -- Family has noted mild encephalopathy/delirium symptoms-hallucination seeing clowns -- 2 stools recorded yesterday -- Hemoglobin stable at 8.9  MELD 3.0: 19 at 10/16/2024  5:51 AM MELD-Na: 18 at 10/16/2024  5:51 AM Calculated from: Serum Creatinine: 1.53 mg/dL at 8/76/7973  4:48 AM Serum Sodium: 131 mmol/L at 10/16/2024  5:51 AM Total Bilirubin: 1.2 mg/dL at 8/76/7973  4:48 AM Serum Albumin: 2.7 g/dL at 8/76/7973  4:48 AM INR(ratio): 1.1 at 10/15/2024  5:29 AM Age at listing (hypothetical): 59 years Sex: Female at 10/16/2024  5:51 AM    Objective   Vital signs in last 24 hours: Temp:  [98.3 F (36.8 C)-98.7 F (37.1 C)] 98.4 F (36.9 C) (01/23 1042) Pulse Rate:  [68-76] 73 (01/23 1042) Resp:  [17-18] 17 (01/23 1042) BP: (98-121)/(54-68) 102/56 (01/23 1042) SpO2:   [97 %-100 %] 100 % (01/23 1042) Weight:  [97.1 kg] 97.1 kg (01/23 0500) Last BM Date : 10/16/24 General:    Alert, sitting in chair Heart:  Regular rate and rhythm; no murmurs Lungs: Respirations even and unlabored, lungs CTA bilaterally Abdomen:  Soft, tender to palpation in epigastrium and right upper quadrant and nondistended. Normal bowel sounds. Extremities:  Without edema. Neurologic: Somnolent after sedation for TIPS revision   Intake/Output from previous day: 01/22 0701 - 01/23 0700 In: 240 [P.O.:240] Out: 650 [Urine:650] Intake/Output this shift: Total I/O In: 300 [P.O.:300] Out: -   Lab Results: Recent Labs    10/14/24 0526 10/15/24 0529 10/16/24 0551  WBC 12.9* 10.8* 13.9*  HGB 6.6* 9.1* 8.9*  HCT 19.5* 26.4* 26.1*  PLT 123* 142* 169   BMET Recent Labs    10/14/24 0526 10/15/24 0529 10/16/24 0551  NA 133* 133* 131*  K 2.7* 4.0 4.3  CL 107 104 102  CO2 17* 20* 21*  GLUCOSE 102* 112* 123*  BUN 56* 50* 47*  CREATININE 1.31* 1.28* 1.53*  CALCIUM  5.8* 8.2* 8.3*   LFT Recent Labs    10/16/24 0551  PROT 5.7*  ALBUMIN 2.7*  AST 40  ALT 20  ALKPHOS 97  BILITOT 1.2   PT/INR Recent Labs    10/15/24 0529  LABPROT 14.9  INR 1.1    Ammonia 42  Studies/Results: IR TIPS REVISION MOD SED Result Date: 10/16/2024 CLINICAL DATA:  TIPS revision Briefly, 59 year old female with a history of primary biliary cirrhosis with gastric varices and gastro renal shunt who presented with gastric variceal hemorrhage, now s/p TIPS and BRTO (  10/10/2024). Acute abdominal pain with repeat CTA (10/14/2024) demonstrating TIPS occlusion. EXAM: Procedures: 1. PORTAL and HEPATIC VENOGRAPHY 2. TRANSJUGULAR INTRAHEPATIC PORTOSYSTEMIC SHUNT (TIPS) THROMBECTOMY and REVISION 3. THERAPEUTIC PARACENTESIS MEDICATIONS: 50 mg Benadryl  IV. ANESTHESIA/SEDATION: Moderate (conscious) sedation was employed during this procedure. A total of Versed  3.5 mg and Fentanyl  125 mcg was administered  intravenously. Moderate Sedation Time: 106 minutes. The patient's level of consciousness and vital signs were monitored continuously by radiology nursing throughout the procedure under my direct supervision. CONTRAST:  90 mL OMNIPAQUE  IOHEXOL  300 MG/ML  SOLN FLUOROSCOPY: Radiation Exposure Index and estimated peak skin dose (PSD); Reference air kerma (RAK), 742.3 mGy. COMPLICATIONS: None immediate. PROCEDURE: Informed written consent was obtained from the patient and/or patient's representative after a thorough discussion of the procedural risks, benefits and alternatives. All questions were addressed. Maximal Sterile Barrier Technique was utilized including caps, mask, sterile gowns, sterile gloves, sterile drape, hand hygiene and skin antiseptic. The skin overlying the RIGHT upper abdominal quadrant as well as the RIGHT neck were prepped and draped in usual sterile fashion. A timeout was performed prior to the initiation of the procedure. PARACENTESIS; Initial ultrasound scanning demonstrates a moderate volume of ascites within the abdomen. Under direct ultrasound guidance, a 7 cm Yueh catheter was introduced. An ultrasound image was saved for documentation purposes. Therapeutic paracentesis was performed. PORTAL VENOGRAPHY; 1% Lidocaine  was used for local anesthesia. Ultrasound evaluation showed a patent RIGHT internal jugular vein. Ultrasound image was saved and sent to PACS. Under ultrasound guidance, the right internal jugular vein was access using a 21-G needle which was dilated and exchanged for a 16 Fr sheath over a guidewire. Under fluoroscopic guidance, utilizing a 5 Fr C2 catheter and 0.035-inch Bentson guidewire manipulation, the catheter was advanced through the TIPS shunt and into the main portal vein. Digital subtraction venography was performed. The TIPS shunt was occluded and no significant flow through the shunt was identified. Pressure measurements were performed in the portal vein and in the  RIGHT atrium. Over an 0.035 inch Rosen wire, a 12 Fr Penumbra Lightning Flash aspiration catheter was advanced into portal venous confluence and catheter directed thrombectomy was performed. Intermittent and postprocedural portal venograms were performed during thrombus removal. Balloon venoplasty of the TIPS was performed with an 8 mm athletis balloon, dilated to nominal pressure. Completion portal venogram demonstrated a successful dilation of the stent. Post dilatation pressure measurements were obtained following the revision in a similar fashion. Following adequate venographic result, the catheter and sheath were removed and the procedure was terminated. All wires, catheters and sheaths were removed from the patient. Hemostasis was achieved at the RIGHT neck and RIGHT upper abdominal access sites with manual compression. Dressings were applied. The patient tolerated the procedure well without immediate postprocedural complication. FINDINGS: *TIPS thrombosis, without significant extension to the portal vein. *TIPS mechanical thrombectomy with 12 Fr Penumbra lightning aspiration catheter, and revision venoplasty with 8 mm balloon was performed. *No residual opacification of the previously-embolized gastric varices. *Predominant hepatofugal flow, with preferential flow to the SMV. *Minimal reduction of portosystemic gradient from 30 to 29 mm Hg. *Moderate volume intra-abdominal ascites. Ultrasound-guided paracentesis was performed yielding 600 mL of serosanguineous ascitic fluid. Pre-TIPS revision mean Pressures (mmHg): Right atrium: 0 Portal vein: 30 Portosystemic gradient: 30 Post-TIPS revision mean Pressures (mmHg): Right atrium: 0 Portal vein: 29 Portosystemic gradient: 29 IMPRESSION: 1. Mechanical thrombectomy and revision venoplasty of transjugular intrahepatic portosystemic shunt (TIPS), secondary to thrombosis. No significant extension to the portal vein. 2. Successful ultrasound-guided paracentesis  yielding 600 mL of ascitic fluid. 3. Predominant hepatofugal flow, with concern for prolonged TIPS patency. Recommend anticoagulation to avoid re-thrombosis. Thom Hall, MD [facility] Radiology Electronically Signed   By: Thom Hall M.D.   On: 10/16/2024 10:17   IR US  Guide Vasc Access Right Result Date: 10/16/2024 CLINICAL DATA:  TIPS revision Briefly, 59 year old female with a history of primary biliary cirrhosis with gastric varices and gastro renal shunt who presented with gastric variceal hemorrhage, now s/p TIPS and BRTO (10/10/2024). Acute abdominal pain with repeat CTA (10/14/2024) demonstrating TIPS occlusion. EXAM: Procedures: 1. PORTAL and HEPATIC VENOGRAPHY 2. TRANSJUGULAR INTRAHEPATIC PORTOSYSTEMIC SHUNT (TIPS) THROMBECTOMY and REVISION 3. THERAPEUTIC PARACENTESIS MEDICATIONS: 50 mg Benadryl  IV. ANESTHESIA/SEDATION: Moderate (conscious) sedation was employed during this procedure. A total of Versed  3.5 mg and Fentanyl  125 mcg was administered intravenously. Moderate Sedation Time: 106 minutes. The patient's level of consciousness and vital signs were monitored continuously by radiology nursing throughout the procedure under my direct supervision. CONTRAST:  90 mL OMNIPAQUE  IOHEXOL  300 MG/ML  SOLN FLUOROSCOPY: Radiation Exposure Index and estimated peak skin dose (PSD); Reference air kerma (RAK), 742.3 mGy. COMPLICATIONS: None immediate. PROCEDURE: Informed written consent was obtained from the patient and/or patient's representative after a thorough discussion of the procedural risks, benefits and alternatives. All questions were addressed. Maximal Sterile Barrier Technique was utilized including caps, mask, sterile gowns, sterile gloves, sterile drape, hand hygiene and skin antiseptic. The skin overlying the RIGHT upper abdominal quadrant as well as the RIGHT neck were prepped and draped in usual sterile fashion. A timeout was performed prior to  the initiation of the procedure. PARACENTESIS; Initial ultrasound scanning demonstrates a moderate volume of ascites within the abdomen. Under direct ultrasound guidance, a 7 cm Yueh catheter was introduced. An ultrasound image was saved for documentation purposes. Therapeutic paracentesis was performed. PORTAL VENOGRAPHY; 1% Lidocaine  was used for local anesthesia. Ultrasound evaluation showed a patent RIGHT internal jugular vein. Ultrasound image was saved and sent to PACS. Under ultrasound guidance, the right internal jugular vein was access using a 21-G needle which was dilated and exchanged for a 16 Fr sheath over a guidewire. Under fluoroscopic guidance, utilizing a 5 Fr C2 catheter and 0.035-inch Bentson guidewire manipulation, the catheter was advanced through the TIPS shunt and into the main portal vein. Digital subtraction venography was performed. The TIPS shunt was occluded and no significant flow through the shunt was identified. Pressure measurements were performed in the portal vein and in the RIGHT atrium. Over an 0.035 inch Rosen wire, a 12 Fr Penumbra Lightning Flash aspiration catheter was advanced into portal venous confluence and catheter directed thrombectomy was performed. Intermittent and postprocedural portal venograms were performed during thrombus removal. Balloon venoplasty of the TIPS was performed with an 8 mm athletis balloon, dilated to nominal pressure. Completion portal venogram demonstrated a successful dilation of the stent. Post dilatation pressure measurements were obtained following the revision in a similar fashion. Following adequate venographic result, the catheter and sheath were removed and the procedure was terminated. All wires, catheters and sheaths were removed from the patient. Hemostasis was achieved at the RIGHT neck and RIGHT upper abdominal access sites with manual compression. Dressings were applied. The patient tolerated the procedure well without immediate  postprocedural complication. FINDINGS: *TIPS thrombosis, without significant extension to the portal vein. *TIPS mechanical thrombectomy with 12 Fr Penumbra lightning aspiration catheter, and revision venoplasty with 8 mm balloon was performed. *No residual opacification of the previously-embolized gastric varices. *Predominant  hepatofugal flow, with preferential flow to the SMV. *Minimal reduction of portosystemic gradient from 30 to 29 mm Hg. *Moderate volume intra-abdominal ascites. Ultrasound-guided paracentesis was performed yielding 600 mL of serosanguineous ascitic fluid. Pre-TIPS revision mean Pressures (mmHg): Right atrium: 0 Portal vein: 30 Portosystemic gradient: 30 Post-TIPS revision mean Pressures (mmHg): Right atrium: 0 Portal vein: 29 Portosystemic gradient: 29 IMPRESSION: 1. Mechanical thrombectomy and revision venoplasty of transjugular intrahepatic portosystemic shunt (TIPS), secondary to thrombosis. No significant extension to the portal vein. 2. Successful ultrasound-guided paracentesis yielding 600 mL of ascitic fluid. 3. Predominant hepatofugal flow, with concern for prolonged TIPS patency. Recommend anticoagulation to avoid re-thrombosis. Thom Hall, MD [facility] Radiology Electronically Signed   By: Thom Hall M.D.   On: 10/16/2024 10:17   IR Paracentesis Result Date: 10/16/2024 CLINICAL DATA:  TIPS revision Briefly, 59 year old female with a history of primary biliary cirrhosis with gastric varices and gastro renal shunt who presented with gastric variceal hemorrhage, now s/p TIPS and BRTO (10/10/2024). Acute abdominal pain with repeat CTA (10/14/2024) demonstrating TIPS occlusion. EXAM: Procedures: 1. PORTAL and HEPATIC VENOGRAPHY 2. TRANSJUGULAR INTRAHEPATIC PORTOSYSTEMIC SHUNT (TIPS) THROMBECTOMY and REVISION 3. THERAPEUTIC PARACENTESIS MEDICATIONS: 50 mg Benadryl  IV. ANESTHESIA/SEDATION: Moderate (conscious) sedation was employed  during this procedure. A total of Versed  3.5 mg and Fentanyl  125 mcg was administered intravenously. Moderate Sedation Time: 106 minutes. The patient's level of consciousness and vital signs were monitored continuously by radiology nursing throughout the procedure under my direct supervision. CONTRAST:  90 mL OMNIPAQUE  IOHEXOL  300 MG/ML  SOLN FLUOROSCOPY: Radiation Exposure Index and estimated peak skin dose (PSD); Reference air kerma (RAK), 742.3 mGy. COMPLICATIONS: None immediate. PROCEDURE: Informed written consent was obtained from the patient and/or patient's representative after a thorough discussion of the procedural risks, benefits and alternatives. All questions were addressed. Maximal Sterile Barrier Technique was utilized including caps, mask, sterile gowns, sterile gloves, sterile drape, hand hygiene and skin antiseptic. The skin overlying the RIGHT upper abdominal quadrant as well as the RIGHT neck were prepped and draped in usual sterile fashion. A timeout was performed prior to the initiation of the procedure. PARACENTESIS; Initial ultrasound scanning demonstrates a moderate volume of ascites within the abdomen. Under direct ultrasound guidance, a 7 cm Yueh catheter was introduced. An ultrasound image was saved for documentation purposes. Therapeutic paracentesis was performed. PORTAL VENOGRAPHY; 1% Lidocaine  was used for local anesthesia. Ultrasound evaluation showed a patent RIGHT internal jugular vein. Ultrasound image was saved and sent to PACS. Under ultrasound guidance, the right internal jugular vein was access using a 21-G needle which was dilated and exchanged for a 16 Fr sheath over a guidewire. Under fluoroscopic guidance, utilizing a 5 Fr C2 catheter and 0.035-inch Bentson guidewire manipulation, the catheter was advanced through the TIPS shunt and into the main portal vein. Digital subtraction venography was performed. The TIPS shunt was occluded and no significant flow through the shunt  was identified. Pressure measurements were performed in the portal vein and in the RIGHT atrium. Over an 0.035 inch Rosen wire, a 12 Fr Penumbra Lightning Flash aspiration catheter was advanced into portal venous confluence and catheter directed thrombectomy was performed. Intermittent and postprocedural portal venograms were performed during thrombus removal. Balloon venoplasty of the TIPS was performed with an 8 mm athletis balloon, dilated to nominal pressure. Completion portal venogram demonstrated a successful dilation of the stent. Post dilatation pressure measurements were obtained following the revision in a similar fashion. Following adequate venographic result, the catheter and  sheath were removed and the procedure was terminated. All wires, catheters and sheaths were removed from the patient. Hemostasis was achieved at the RIGHT neck and RIGHT upper abdominal access sites with manual compression. Dressings were applied. The patient tolerated the procedure well without immediate postprocedural complication. FINDINGS: *TIPS thrombosis, without significant extension to the portal vein. *TIPS mechanical thrombectomy with 12 Fr Penumbra lightning aspiration catheter, and revision venoplasty with 8 mm balloon was performed. *No residual opacification of the previously-embolized gastric varices. *Predominant hepatofugal flow, with preferential flow to the SMV. *Minimal reduction of portosystemic gradient from 30 to 29 mm Hg. *Moderate volume intra-abdominal ascites. Ultrasound-guided paracentesis was performed yielding 600 mL of serosanguineous ascitic fluid. Pre-TIPS revision mean Pressures (mmHg): Right atrium: 0 Portal vein: 30 Portosystemic gradient: 30 Post-TIPS revision mean Pressures (mmHg): Right atrium: 0 Portal vein: 29 Portosystemic gradient: 29 IMPRESSION: 1. Mechanical thrombectomy and revision venoplasty of transjugular intrahepatic portosystemic shunt (TIPS), secondary to thrombosis. No  significant extension to the portal vein. 2. Successful ultrasound-guided paracentesis yielding 600 mL of ascitic fluid. 3. Predominant hepatofugal flow, with concern for prolonged TIPS patency. Recommend anticoagulation to avoid re-thrombosis. Thom Hall, MD [facility] Specialists [facility] Radiology Electronically Signed   By: Thom Hall M.D.   On: 10/16/2024 10:17   IR THROMBECT VENO MECH MOD SED Result Date: 10/16/2024 CLINICAL DATA:  TIPS revision Briefly, 59 year old female with a history of primary biliary cirrhosis with gastric varices and gastro renal shunt who presented with gastric variceal hemorrhage, now s/p TIPS and BRTO (10/10/2024). Acute abdominal pain with repeat CTA (10/14/2024) demonstrating TIPS occlusion. EXAM: Procedures: 1. PORTAL and HEPATIC VENOGRAPHY 2. TRANSJUGULAR INTRAHEPATIC PORTOSYSTEMIC SHUNT (TIPS) THROMBECTOMY and REVISION 3. THERAPEUTIC PARACENTESIS MEDICATIONS: 50 mg Benadryl  IV. ANESTHESIA/SEDATION: Moderate (conscious) sedation was employed during this procedure. A total of Versed  3.5 mg and Fentanyl  125 mcg was administered intravenously. Moderate Sedation Time: 106 minutes. The patient's level of consciousness and vital signs were monitored continuously by radiology nursing throughout the procedure under my direct supervision. CONTRAST:  90 mL OMNIPAQUE  IOHEXOL  300 MG/ML  SOLN FLUOROSCOPY: Radiation Exposure Index and estimated peak skin dose (PSD); Reference air kerma (RAK), 742.3 mGy. COMPLICATIONS: None immediate. PROCEDURE: Informed written consent was obtained from the patient and/or patient's representative after a thorough discussion of the procedural risks, benefits and alternatives. All questions were addressed. Maximal Sterile Barrier Technique was utilized including caps, mask, sterile gowns, sterile gloves, sterile drape, hand hygiene and skin antiseptic. The skin overlying the RIGHT upper abdominal quadrant as well as the RIGHT  neck were prepped and draped in usual sterile fashion. A timeout was performed prior to the initiation of the procedure. PARACENTESIS; Initial ultrasound scanning demonstrates a moderate volume of ascites within the abdomen. Under direct ultrasound guidance, a 7 cm Yueh catheter was introduced. An ultrasound image was saved for documentation purposes. Therapeutic paracentesis was performed. PORTAL VENOGRAPHY; 1% Lidocaine  was used for local anesthesia. Ultrasound evaluation showed a patent RIGHT internal jugular vein. Ultrasound image was saved and sent to PACS. Under ultrasound guidance, the right internal jugular vein was access using a 21-G needle which was dilated and exchanged for a 16 Fr sheath over a guidewire. Under fluoroscopic guidance, utilizing a 5 Fr C2 catheter and 0.035-inch Bentson guidewire manipulation, the catheter was advanced through the TIPS shunt and into the main portal vein. Digital subtraction venography was performed. The TIPS shunt was occluded and no significant flow through the shunt was identified. Pressure measurements were performed in the portal  vein and in the RIGHT atrium. Over an 0.035 inch Rosen wire, a 12 Fr Penumbra Lightning Flash aspiration catheter was advanced into portal venous confluence and catheter directed thrombectomy was performed. Intermittent and postprocedural portal venograms were performed during thrombus removal. Balloon venoplasty of the TIPS was performed with an 8 mm athletis balloon, dilated to nominal pressure. Completion portal venogram demonstrated a successful dilation of the stent. Post dilatation pressure measurements were obtained following the revision in a similar fashion. Following adequate venographic result, the catheter and sheath were removed and the procedure was terminated. All wires, catheters and sheaths were removed from the patient. Hemostasis was achieved at the RIGHT neck and RIGHT upper abdominal access sites with manual compression.  Dressings were applied. The patient tolerated the procedure well without immediate postprocedural complication. FINDINGS: *TIPS thrombosis, without significant extension to the portal vein. *TIPS mechanical thrombectomy with 12 Fr Penumbra lightning aspiration catheter, and revision venoplasty with 8 mm balloon was performed. *No residual opacification of the previously-embolized gastric varices. *Predominant hepatofugal flow, with preferential flow to the SMV. *Minimal reduction of portosystemic gradient from 30 to 29 mm Hg. *Moderate volume intra-abdominal ascites. Ultrasound-guided paracentesis was performed yielding 600 mL of serosanguineous ascitic fluid. Pre-TIPS revision mean Pressures (mmHg): Right atrium: 0 Portal vein: 30 Portosystemic gradient: 30 Post-TIPS revision mean Pressures (mmHg): Right atrium: 0 Portal vein: 29 Portosystemic gradient: 29 IMPRESSION: 1. Mechanical thrombectomy and revision venoplasty of transjugular intrahepatic portosystemic shunt (TIPS), secondary to thrombosis. No significant extension to the portal vein. 2. Successful ultrasound-guided paracentesis yielding 600 mL of ascitic fluid. 3. Predominant hepatofugal flow, with concern for prolonged TIPS patency. Recommend anticoagulation to avoid re-thrombosis. Thom Hall, MD [facility] Inter[facility] Radiology Specialists [facility] Radiology Electronically Signed   By: Thom Hall M.D.   On: 10/16/2024 10:17   CT ANGIO ABD/PELVIS BRTO Result Date: 10/14/2024 EXAM: CTA ABDOMEN AND PELVIS WITH CONTRAST 10/14/2024 03:49:00 PM TECHNIQUE: CTA images of the abdomen and pelvis with intravenous contrast. Three-dimensional MIP/volume rendered formations were performed. Automated exposure control, iterative reconstruction, and/or weight based adjustment of the mA/kV was utilized to reduce the radiation dose to as low as reasonably achievable. COMPARISON: 1 17 26  CLINICAL HISTORY: s/p TIPS/BRTO, drop in hgb FINDINGS: Blood pool is  hypodense compared to the interventricular septum suggesting anemia. VASCULATURE: AORTA: No acute finding. No abdominal aortic aneurysm. No dissection. CELIAC TRUNK: No acute finding. No occlusion or significant stenosis. SUPERIOR MESENTERIC ARTERY: No acute finding. No occlusion or significant stenosis. RENAL ARTERIES: No acute finding. No occlusion or significant stenosis. INFERIOR MESENTERIC ARTERY: Focal calcified plaque of the origin of the inferior mesenteric artery which is patent distally. ILIAC ARTERIES: Mild scattered calcified plaque in bilateral internal iliac arteries. No occlusion or significant stenosis. 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 SUPERIOR MESENTERIC VEIN: Superior mesenteric vein patent. ILIAC VEINS, IVC, RENAL VEINS: Patent iliac venous system, IVC, and bilateral renal veins. TIPS STENT: No definite contrast enhancement through the TIPS stent. VARICES: Streak artifact from multiple embolization coils degrades evaluation of left upper quadrant varices. However, some visible varices near the GE junction remain patent. LIVER: Nodular hepatic contour. GALLBLADDER AND BILE DUCTS: Gallbladder is unremarkable. No biliary ductal dilatation. SPLEEN: The spleen is unremarkable. PANCREAS: The pancreas is unremarkable. ADRENAL GLANDS: Bilateral adrenal glands demonstrate no acute abnormality. KIDNEYS, URETERS AND BLADDER: No stones in the kidneys or ureters. No hydronephrosis. No perinephric or periureteral stranding. Urinary bladder is unremarkable. GI AND BOWEL: Stomach and duodenal sweep demonstrate no acute  abnormality. There is no bowel obstruction. No abnormal bowel wall thickening or distension. REPRODUCTIVE: Reproductive organs are unremarkable. PERITONEUM AND RETROPERITONEUM: Small volume pelvic and perihepatic ascites, new since previous. No free air. LUNG BASE: No acute abnormality. LYMPH NODES: No lymphadenopathy. BONES AND SOFT TISSUES: Early degenerative disc  disease L3-L4. No acute abnormality of the bones. No acute soft tissue abnormality. IMPRESSION: 1. 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 2. Small volume pelvic and perihepatic ascites, new since previous. 3. Streak artifact from multiple embolization coils limits evaluation of left upper quadrant varices; some visible varices near the GE junction remain patent. 4. Hypodense blood pool compared to the interventricular septum, suggesting anemia. Electronically signed by: Katheleen Faes MD 10/14/2024 04:24 PM EST RP Workstation: [id number]     Endoscopic Studies: EGD 10/10/2024 - no esophageal varices, clotted blood in the cardia, gastric fundus and in the gastric body, large amount of type I isolated gastric varix in the cardia and fundus with no active bleeding but did have stigmata of recent bleeding and hematoma, medium in size     Clinical Impression   59 year old female with a past medical history of autoimmune liver cirrhosis, hepatic encephalopathy, gastric varices, primary biliary cholangitis, hypertension, hyperlipidemia, obesity who presented to the hospital with multiple episodes of bloody emesis.   EGD 10/10/2024 - no esophageal varices, clotted blood in the cardia, gastric fundus and in the gastric body, large amount of type I isolated gastric varix in the cardia and fundus with no active bleeding but did have stigmata of recent bleeding and hematoma, medium in size  CTAP with new finding of spontaneous splenorenal shunt   10/10/2024  status post TIPS, coil embolization of posterior left gastric veins and coil assisted retrograde transvenous obliteration of gastrorenal shunt  Ms. Clarisse was clinically stable until 10/14/2024 when hemoglobin was noted to decline from 8.0 --> 6.6.  CTA/BRTO showed concern for TIPS occlusion and she underwent revision of her TIPS 10/15/2024.  She appears much  more alert and interactive today.  Family at bedside does note mild symptoms of encephalopathy versus delirium -reporting hallucinations of seeing clowns.  She had 2 bowel movements over the last 24 hours.  Her lactulose  dosing was previously decreased because of excessive stooling and she may benefit from titration of her lactulose  in that respect.  No evidence of recurrent bleeding since decline in hemoglobin on 10/14/2024.  Hemoglobin now stable at 8.9.  Leukocytosis felt to be reactive and improving.  Currently afebrile.  Labs noteworthy for AKI that is improving-multifactorial related to shock, possible contrast nephropathy   Plan  Monitor CBC and transfuse to maintain hemoglobin greater than 7 If there is evidence of recurrent overt bleeding recommend resumption of octreotide  drip Increase lactulose  10 g p.o. to 3 times daily given mild symptoms of encephalopathy-titrate to 3-4 bowel movements daily Continue rifaximin  550 mg p.o. twice daily Continue zinc  220 mg daily Monitor serial H&H and maintain hemoglobin greater than 7 Continue Coreg  3.125 mg p.o. twice daily Continue pantoprazole  40 mg IV daily Continue soft diet and advance as tolerated. Follow-up with [facility]/ [name], NP after hospitalization  Dr. Rollin will assume rounding responsibilities for [facility] 10/17/2024   LOS: 6 days   [name]  10/16/2024, 2:40 PM  [name], MD  GI  "

## 2024-10-16 NOTE — Plan of Care (Signed)

## 2024-10-17 DIAGNOSIS — K922 Gastrointestinal hemorrhage, unspecified: Secondary | ICD-10-CM | POA: Diagnosis not present

## 2024-10-17 LAB — CBC WITH DIFFERENTIAL/PLATELET
Abs Immature Granulocytes: 0.56 10*3/uL — ABNORMAL HIGH (ref 0.00–0.07)
Basophils Absolute: 0 10*3/uL (ref 0.0–0.1)
Basophils Relative: 0 %
Eosinophils Absolute: 0.3 10*3/uL (ref 0.0–0.5)
Eosinophils Relative: 2 %
HCT: 25.6 % — ABNORMAL LOW (ref 36.0–46.0)
Hemoglobin: 8.5 g/dL — ABNORMAL LOW (ref 12.0–15.0)
Immature Granulocytes: 5 %
Lymphocytes Relative: 13 %
Lymphs Abs: 1.4 10*3/uL (ref 0.7–4.0)
MCH: 31 pg (ref 26.0–34.0)
MCHC: 33.2 g/dL (ref 30.0–36.0)
MCV: 93.4 fL (ref 80.0–100.0)
Monocytes Absolute: 1.1 10*3/uL — ABNORMAL HIGH (ref 0.1–1.0)
Monocytes Relative: 10 %
Neutro Abs: 8 10*3/uL — ABNORMAL HIGH (ref 1.7–7.7)
Neutrophils Relative %: 70 %
Platelets: 165 10*3/uL (ref 150–400)
RBC: 2.74 MIL/uL — ABNORMAL LOW (ref 3.87–5.11)
RDW: 14.8 % (ref 11.5–15.5)
WBC: 11.4 10*3/uL — ABNORMAL HIGH (ref 4.0–10.5)
nRBC: 0.4 % — ABNORMAL HIGH (ref 0.0–0.2)

## 2024-10-17 LAB — COMPREHENSIVE METABOLIC PANEL WITH GFR
ALT: 17 U/L (ref 0–44)
AST: 36 U/L (ref 15–41)
Albumin: 2.8 g/dL — ABNORMAL LOW (ref 3.5–5.0)
Alkaline Phosphatase: 100 U/L (ref 38–126)
Anion gap: 9 (ref 5–15)
BUN: 52 mg/dL — ABNORMAL HIGH (ref 6–20)
CO2: 19 mmol/L — ABNORMAL LOW (ref 22–32)
Calcium: 8.2 mg/dL — ABNORMAL LOW (ref 8.9–10.3)
Chloride: 100 mmol/L (ref 98–111)
Creatinine, Ser: 2.24 mg/dL — ABNORMAL HIGH (ref 0.44–1.00)
GFR, Estimated: 25 mL/min — ABNORMAL LOW
Glucose, Bld: 205 mg/dL — ABNORMAL HIGH (ref 70–99)
Potassium: 3.8 mmol/L (ref 3.5–5.1)
Sodium: 128 mmol/L — ABNORMAL LOW (ref 135–145)
Total Bilirubin: 1.1 mg/dL (ref 0.0–1.2)
Total Protein: 5.5 g/dL — ABNORMAL LOW (ref 6.5–8.1)

## 2024-10-17 LAB — AMMONIA: Ammonia: 34 umol/L (ref 9–35)

## 2024-10-17 LAB — GLUCOSE, CAPILLARY
Glucose-Capillary: 135 mg/dL — ABNORMAL HIGH (ref 70–99)
Glucose-Capillary: 214 mg/dL — ABNORMAL HIGH (ref 70–99)
Glucose-Capillary: 161 mg/dL — ABNORMAL HIGH (ref 70–99)

## 2024-10-17 LAB — MAGNESIUM: Magnesium: 2.2 mg/dL (ref 1.7–2.4)

## 2024-10-17 NOTE — Progress Notes (Signed)
 " PROGRESS NOTE    Susan [name]  FMW:[id number] DOB: 1965/11/22 DOA: 10/09/2024 PCP: Corwin Antu, FNP   Brief Narrative:  59 year old female with history of alcoholic cirrhosis, esophageal varices, primary biliary cholangitis, hepatic encephalopathy, hypertension, hyperlipidemia, obesity and hospitalization in December 2025 for hepatic encephalopathy presented with hematemesis.  She was initially admitted to ICU and intubated for airway protection she was transfused packed red cells.  She underwent EGD on 10/10/2024 followed by TIPS.  She was extubated on 10/11/2024.  Care has been transferred to [facility] service from 10/13/2024.  She has had 2 unit packed red cells transfusion so far during the hospitalization.  She underwent TIPS thrombectomy and revision and therapeutic paracentesis with removal of 600 cc fluid by IR on 10/15/2024.  Assessment & Plan:   Hemorrhagic shock Upper GI bleeding from variceal bleeding Acute blood loss anemia - Has received 2 unit packed red cells during this hospitalization: Latest on 10/14/2024: Transfused 1 unit packed red cells for hemoglobin of 6.6.  Hemoglobin pending this morning.  Monitor H&H -underwent EGD on 10/10/2024: Large amount of clotted blood found in the stomach along with type I gastric varices -s/p TIPS on 10/10/2024: Coil embolization of posterior and left gastric veins, and coil assisted retrograde transvenous obliteration of gastrorenal shunt.  IR following.  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  Patient underwent TIPS thrombectomy and revision and therapeutic paracentesis with removal of 600 cc fluid by IR on 10/15/2024 -GI following: Currently IV Protonix .  Off octreotide  drip.  Completed 5-day course of Rocephin . - Diet advancement as per GI  Alcoholic cirrhosis Hepatic encephalopathy - GI following.  Continue rifaximin  and lactulose  monitor mental status  AKI - Possibly  prerenal.  Creatinine slightly worsened to 1.53 on 10/16/2024.  Labs pending today.  Monitor  Hypokalemia - Improved  Hypocalcemia - Improved  Hyponatremia - Labs pending today  Leukocytosis - Labs pending today.  Monitor.  Thrombocytopenia - Resolved  Acute metabolic acidosis - Labs pending today  Acute hypoxic respiratory failure -Status post intubation on admission and extubated on 10/11/2024.  Currently on room air  Diabetes mellitus type 2 -Continue CBGs with SSI  Obesity class III - Outpatient follow-up  Physical deconditioning - Will need home health PT/OT    DVT prophylaxis: SCDs.   Code Status: Full Family Communication: Husband at bedside Disposition Plan: Status is: Inpatient Remains inpatient appropriate because: Of severity of illness    Consultants: GI/IR  Procedures: As above  Antimicrobials:  Anti-infectives (From admission, onward)    Start     Dose/Rate Route Frequency Ordered Stop   10/14/24 0915  cefTRIAXone  (ROCEPHIN ) 1 g in sodium chloride  0.9 % 100 mL IVPB        1 g 200 mL/hr over 30 Minutes Intravenous Every 24 hours 10/14/24 0829 10/14/24 0959   10/11/24 1345  rifaximin  (XIFAXAN ) tablet 550 mg        550 mg Oral 2 times daily 10/11/24 1256     10/10/24 0830  cefTRIAXone  (ROCEPHIN ) 1 g in sodium chloride  0.9 % 100 mL IVPB        1 g 200 mL/hr over 30 Minutes Intravenous Every 24 hours 10/10/24 0730 10/13/24 9073        Subjective: Patient seen and examined at bedside.  Patient was little more confused yesterday with husband reporting some hallucination.  This about 80 energy ID (is improving as per husband at bedside.  No fever, seizures or vomiting reported. objective: Vitals:   10/16/24 1854 10/16/24 1955 10/16/24 2125 10/17/24 0655  BP: (!) 116/59 (!) 112/54 105/61 (!) 103/53  Pulse: 82 79 77 77  Resp: 17 17 16 16   Temp: 98.5 F (36.9 C) 98.2 F (36.8 C) 98.7 F (37.1 C) 98.5 F (36.9 C)  TempSrc: Oral Oral Oral  Oral  SpO2: 100% 99% 100%   Weight:    95.8 kg  Height:        Intake/Output Summary (Last 24 hours) at 10/17/2024 0833 Last data filed at 10/16/2024 1850 Gross per 24 hour  Intake 780 ml  Output --  Net 780 ml   Filed Weights   10/15/24 0500 10/16/24 0500 10/17/24 0655  Weight: 97 kg 97.1 kg 95.8 kg    Examination:  General: On room air and currently in no distress.  Looks chronically ill and deconditioned ENT/neck: No neck masses palpable or JVD elevation noted  respiratory: Bilateral decreased breath sounds at bases with some crackles CVS: S1 and S2 heard; rate currently controlled   abdominal: Soft, morbidly obese, distended and mildly tender; no organomegaly, normal bowel sounds are heard Extremities: Bilateral lower extremity edema present; no cyanosis  CNS: Remains slow to respond and an extremely poor historian.  No focal deficits noted  lymph: No lymphadenopathy palpable Skin: No obvious ecchymosis/lesions psych: Flat affect.  Not agitated  musculoskeletal: No obvious joint swelling/deformity  Data Reviewed: I have personally reviewed following labs and imaging studies  CBC: Recent Labs  Lab 10/12/24 2019 10/13/24 0458 10/14/24 0526 10/15/24 0529 10/16/24 0551  WBC 18.7* 15.4* 12.9* 10.8* 13.9*  NEUTROABS 12.2* 10.4* 7.6 6.3 9.0*  HGB 7.9* 8.0* 6.6* 9.1* 8.9*  HCT 23.0* 23.4* 19.5* 26.4* 26.1*  MCV 93.5 92.5 94.7 92.0 94.2  PLT 114* 117* 123* 142* 169   Basic Metabolic Panel: Recent Labs  Lab 10/12/24 0551 10/12/24 0557 10/13/24 1127 10/14/24 0526 10/15/24 0529 10/16/24 0551  NA  --  136 133* 133* 133* 131*  K  --  4.0 3.7 2.7* 4.0 4.3  CL  --  106 102 107 104 102  CO2  --  20* 23 17* 20* 21*  GLUCOSE  --  172* 152* 102* 112* 123*  BUN  --  79* 78* 56* 50* 47*  CREATININE  --  2.65* 2.13* 1.31* 1.28* 1.53*  CALCIUM   --  7.8* 7.8* 5.8* 8.2* 8.3*  MG 1.7  --   --  1.7 2.3 2.3  PHOS 4.2  --   --   --   --   --    GFR: Estimated Creatinine  Clearance: 41.5 mL/min (A) (by C-G formula based on SCr of 1.53 mg/dL (H)). Liver Function Tests: Recent Labs  Lab 10/11/24 0905 10/14/24 0526 10/15/24 0529 10/16/24 0551  AST 64* 37 39 40  ALT 31 21 24 20   ALKPHOS 94 76 96 97  BILITOT 0.6 0.7 1.2 1.2  PROT 5.6* 4.3* 5.7* 5.7*  ALBUMIN 2.7* 2.2* 2.8* 2.7*   No results for input(s): LIPASE, AMYLASE in the last 168 hours.  Recent Labs  Lab 10/12/24 0557 10/15/24 0529  AMMONIA 42* 48*   Coagulation Profile: Recent Labs  Lab 10/11/24 0905 10/15/24 0529  INR 1.3* 1.1   Cardiac Enzymes: No results for input(s): CKTOTAL, CKMB, CKMBINDEX, TROPONINI in the last 168 hours. BNP (last 3 results) No results for input(s): PROBNP in the last 8760 hours. HbA1C: No results for input(s): HGBA1C in the last 72  hours. CBG: Recent Labs  Lab 10/15/24 2045 10/16/24 0602 10/16/24 1257 10/16/24 1744 10/17/24 0758  GLUCAP 159* 117* 186* 135* 135*   Lipid Profile: No results for input(s): CHOL, HDL, LDLCALC, TRIG, CHOLHDL, LDLDIRECT in the last 72 hours.  Thyroid  Function Tests: No results for input(s): TSH, T4TOTAL, FREET4, T3FREE, THYROIDAB in the last 72 hours. Anemia Panel: No results for input(s): VITAMINB12, FOLATE, FERRITIN, TIBC, IRON, RETICCTPCT in the last 72 hours. Sepsis Labs: No results for input(s): PROCALCITON, LATICACIDVEN in the last 168 hours.  Recent Results (from the past 240 hours)  MRSA Next Gen by PCR, Nasal     Status: None   Collection Time: 10/10/24  4:17 AM   Specimen: Nasal Mucosa; Nasal Swab  Result Value Ref Range Status   MRSA by PCR Next Gen NOT DETECTED NOT DETECTED Final    Comment: (NOTE) The GeneXpert MRSA Assay (FDA approved for NASAL specimens only), is one component of a comprehensive MRSA colonization surveillance program. It is not intended to diagnose MRSA infection nor to guide or monitor treatment for MRSA infections. Test  performance is not FDA approved in patients less than 5 years old. Performed at [facility], [address] [place], [place] [address]          Radiology Studies: IR TIPS REVISION MOD SED Result Date: 10/16/2024 CLINICAL DATA:  TIPS revision Briefly, 59 year old female with a history of primary biliary cirrhosis with gastric varices and gastro renal shunt who presented with gastric variceal hemorrhage, now s/p TIPS and BRTO (10/10/2024). Acute abdominal pain with repeat CTA (10/14/2024) demonstrating TIPS occlusion. EXAM: Procedures: 1. PORTAL and HEPATIC VENOGRAPHY 2. TRANSJUGULAR INTRAHEPATIC PORTOSYSTEMIC SHUNT (TIPS) THROMBECTOMY and REVISION 3. THERAPEUTIC PARACENTESIS MEDICATIONS: 50 mg Benadryl  IV. ANESTHESIA/SEDATION: Moderate (conscious) sedation was employed during this procedure. A total of Versed  3.5 mg and Fentanyl  125 mcg was administered intravenously. Moderate Sedation Time: 106 minutes. The patient's level of consciousness and vital signs were monitored continuously by radiology nursing throughout the procedure under my direct supervision. CONTRAST:  90 mL OMNIPAQUE  IOHEXOL  300 MG/ML  SOLN FLUOROSCOPY: Radiation Exposure Index and estimated peak skin dose (PSD); Reference air kerma (RAK), 742.3 mGy. COMPLICATIONS: None immediate. PROCEDURE: Informed written consent was obtained from the patient and/or patient's representative after a thorough discussion of the procedural risks, benefits and alternatives. All questions were addressed. Maximal Sterile Barrier Technique was utilized including caps, mask, sterile gowns, sterile gloves, sterile drape, hand hygiene and skin antiseptic. The skin overlying the RIGHT upper abdominal quadrant as well as the RIGHT neck were prepped and draped in usual sterile fashion. A timeout was performed prior to the initiation of the procedure. PARACENTESIS; Initial ultrasound scanning demonstrates a moderate volume of ascites within the abdomen.  Under direct ultrasound guidance, a 7 cm Yueh catheter was introduced. An ultrasound image was saved for documentation purposes. Therapeutic paracentesis was performed. PORTAL VENOGRAPHY; 1% Lidocaine  was used for local anesthesia. Ultrasound evaluation showed a patent RIGHT internal jugular vein. Ultrasound image was saved and sent to PACS. Under ultrasound guidance, the right internal jugular vein was access using a 21-G needle which was dilated and exchanged for a 16 Fr sheath over a guidewire. Under fluoroscopic guidance, utilizing a 5 Fr C2 catheter and 0.035-inch Bentson guidewire manipulation, the catheter was advanced through the TIPS shunt and into the main portal vein. Digital subtraction venography was performed. The TIPS shunt was occluded and no significant flow through the shunt was identified. Pressure measurements were performed in the  portal vein and in the RIGHT atrium. Over an 0.035 inch Rosen wire, a 12 Fr Penumbra Lightning Flash aspiration catheter was advanced into portal venous confluence and catheter directed thrombectomy was performed. Intermittent and postprocedural portal venograms were performed during thrombus removal. Balloon venoplasty of the TIPS was performed with an 8 mm athletis balloon, dilated to nominal pressure. Completion portal venogram demonstrated a successful dilation of the stent. Post dilatation pressure measurements were obtained following the revision in a similar fashion. Following adequate venographic result, the catheter and sheath were removed and the procedure was terminated. All wires, catheters and sheaths were removed from the patient. Hemostasis was achieved at the RIGHT neck and RIGHT upper abdominal access sites with manual compression. Dressings were applied. The patient tolerated the procedure well without immediate postprocedural complication. FINDINGS: *TIPS thrombosis, without significant extension to the portal vein. *TIPS mechanical thrombectomy with  12 Fr Penumbra lightning aspiration catheter, and revision venoplasty with 8 mm balloon was performed. *No residual opacification of the previously-embolized gastric varices. *Predominant hepatofugal flow, with preferential flow to the SMV. *Minimal reduction of portosystemic gradient from 30 to 29 mm Hg. *Moderate volume intra-abdominal ascites. Ultrasound-guided paracentesis was performed yielding 600 mL of serosanguineous ascitic fluid. Pre-TIPS revision mean Pressures (mmHg): Right atrium: 0 Portal vein: 30 Portosystemic gradient: 30 Post-TIPS revision mean Pressures (mmHg): Right atrium: 0 Portal vein: 29 Portosystemic gradient: 29 IMPRESSION: 1. Mechanical thrombectomy and revision venoplasty of transjugular intrahepatic portosystemic shunt (TIPS), secondary to thrombosis. No significant extension to the portal vein. 2. Successful ultrasound-guided paracentesis yielding 600 mL of ascitic fluid. 3. Predominant hepatofugal flow, with concern for prolonged TIPS patency. Recommend anticoagulation to avoid re-thrombosis. Thom Hall, MD [facility] Radiology Electronically Signed   By: Thom Hall M.D.   On: 10/16/2024 10:17   IR US  Guide Vasc Access Right Result Date: 10/16/2024 CLINICAL DATA:  TIPS revision Briefly, 59 year old female with a history of primary biliary cirrhosis with gastric varices and gastro renal shunt who presented with gastric variceal hemorrhage, now s/p TIPS and BRTO (10/10/2024). Acute abdominal pain with repeat CTA (10/14/2024) demonstrating TIPS occlusion. EXAM: Procedures: 1. PORTAL and HEPATIC VENOGRAPHY 2. TRANSJUGULAR INTRAHEPATIC PORTOSYSTEMIC SHUNT (TIPS) THROMBECTOMY and REVISION 3. THERAPEUTIC PARACENTESIS MEDICATIONS: 50 mg Benadryl  IV. ANESTHESIA/SEDATION: Moderate (conscious) sedation was employed during this procedure. A total of Versed  3.5 mg and Fentanyl  125 mcg was administered intravenously. Moderate Sedation Time: 106  minutes. The patient's level of consciousness and vital signs were monitored continuously by radiology nursing throughout the procedure under my direct supervision. CONTRAST:  90 mL OMNIPAQUE  IOHEXOL  300 MG/ML  SOLN FLUOROSCOPY: Radiation Exposure Index and estimated peak skin dose (PSD); Reference air kerma (RAK), 742.3 mGy. COMPLICATIONS: None immediate. PROCEDURE: Informed written consent was obtained from the patient and/or patient's representative after a thorough discussion of the procedural risks, benefits and alternatives. All questions were addressed. Maximal Sterile Barrier Technique was utilized including caps, mask, sterile gowns, sterile gloves, sterile drape, hand hygiene and skin antiseptic. The skin overlying the RIGHT upper abdominal quadrant as well as the RIGHT neck were prepped and draped in usual sterile fashion. A timeout was performed prior to the initiation of the procedure. PARACENTESIS; Initial ultrasound scanning demonstrates a moderate volume of ascites within the abdomen. Under direct ultrasound guidance, a 7 cm Yueh catheter was introduced. An ultrasound image was saved for documentation purposes. Therapeutic paracentesis was performed. PORTAL VENOGRAPHY; 1% Lidocaine  was used for local anesthesia. Ultrasound evaluation showed a patent RIGHT internal jugular  vein. Ultrasound image was saved and sent to PACS. Under ultrasound guidance, the right internal jugular vein was access using a 21-G needle which was dilated and exchanged for a 16 Fr sheath over a guidewire. Under fluoroscopic guidance, utilizing a 5 Fr C2 catheter and 0.035-inch Bentson guidewire manipulation, the catheter was advanced through the TIPS shunt and into the main portal vein. Digital subtraction venography was performed. The TIPS shunt was occluded and no significant flow through the shunt was identified. Pressure measurements were performed in the portal vein and in the RIGHT atrium. Over an 0.035 inch Rosen wire, a  12 Fr Penumbra Lightning Flash aspiration catheter was advanced into portal venous confluence and catheter directed thrombectomy was performed. Intermittent and postprocedural portal venograms were performed during thrombus removal. Balloon venoplasty of the TIPS was performed with an 8 mm athletis balloon, dilated to nominal pressure. Completion portal venogram demonstrated a successful dilation of the stent. Post dilatation pressure measurements were obtained following the revision in a similar fashion. Following adequate venographic result, the catheter and sheath were removed and the procedure was terminated. All wires, catheters and sheaths were removed from the patient. Hemostasis was achieved at the RIGHT neck and RIGHT upper abdominal access sites with manual compression. Dressings were applied. The patient tolerated the procedure well without immediate postprocedural complication. FINDINGS: *TIPS thrombosis, without significant extension to the portal vein. *TIPS mechanical thrombectomy with 12 Fr Penumbra lightning aspiration catheter, and revision venoplasty with 8 mm balloon was performed. *No residual opacification of the previously-embolized gastric varices. *Predominant hepatofugal flow, with preferential flow to the SMV. *Minimal reduction of portosystemic gradient from 30 to 29 mm Hg. *Moderate volume intra-abdominal ascites. Ultrasound-guided paracentesis was performed yielding 600 mL of serosanguineous ascitic fluid. Pre-TIPS revision mean Pressures (mmHg): Right atrium: 0 Portal vein: 30 Portosystemic gradient: 30 Post-TIPS revision mean Pressures (mmHg): Right atrium: 0 Portal vein: 29 Portosystemic gradient: 29 IMPRESSION: 1. Mechanical thrombectomy and revision venoplasty of transjugular intrahepatic portosystemic shunt (TIPS), secondary to thrombosis. No significant extension to the portal vein. 2. Successful ultrasound-guided paracentesis yielding 600 mL of ascitic fluid. 3. Predominant  hepatofugal flow, with concern for prolonged TIPS patency. Recommend anticoagulation to avoid re-thrombosis. Thom Hall, MD [facility] Radiology Electronically Signed   By: Thom Hall M.D.   On: 10/16/2024 10:17   IR Paracentesis Result Date: 10/16/2024 CLINICAL DATA:  TIPS revision Briefly, 59 year old female with a history of primary biliary cirrhosis with gastric varices and gastro renal shunt who presented with gastric variceal hemorrhage, now s/p TIPS and BRTO (10/10/2024). Acute abdominal pain with repeat CTA (10/14/2024) demonstrating TIPS occlusion. EXAM: Procedures: 1. PORTAL and HEPATIC VENOGRAPHY 2. TRANSJUGULAR INTRAHEPATIC PORTOSYSTEMIC SHUNT (TIPS) THROMBECTOMY and REVISION 3. THERAPEUTIC PARACENTESIS MEDICATIONS: 50 mg Benadryl  IV. ANESTHESIA/SEDATION: Moderate (conscious) sedation was employed during this procedure. A total of Versed  3.5 mg and Fentanyl  125 mcg was administered intravenously. Moderate Sedation Time: 106 minutes. The patient's level of consciousness and vital signs were monitored continuously by radiology nursing throughout the procedure under my direct supervision. CONTRAST:  90 mL OMNIPAQUE  IOHEXOL  300 MG/ML  SOLN FLUOROSCOPY: Radiation Exposure Index and estimated peak skin dose (PSD); Reference air kerma (RAK), 742.3 mGy. COMPLICATIONS: None immediate. PROCEDURE: Informed written consent was obtained from the patient and/or patient's representative after a thorough discussion of the procedural risks, benefits and alternatives. All questions were addressed. Maximal Sterile Barrier Technique was utilized including caps, mask, sterile gowns, sterile gloves, sterile drape, hand hygiene and skin antiseptic. The  skin overlying the RIGHT upper abdominal quadrant as well as the RIGHT neck were prepped and draped in usual sterile fashion. A timeout was performed prior to the initiation of the procedure. PARACENTESIS; Initial  ultrasound scanning demonstrates a moderate volume of ascites within the abdomen. Under direct ultrasound guidance, a 7 cm Yueh catheter was introduced. An ultrasound image was saved for documentation purposes. Therapeutic paracentesis was performed. PORTAL VENOGRAPHY; 1% Lidocaine  was used for local anesthesia. Ultrasound evaluation showed a patent RIGHT internal jugular vein. Ultrasound image was saved and sent to PACS. Under ultrasound guidance, the right internal jugular vein was access using a 21-G needle which was dilated and exchanged for a 16 Fr sheath over a guidewire. Under fluoroscopic guidance, utilizing a 5 Fr C2 catheter and 0.035-inch Bentson guidewire manipulation, the catheter was advanced through the TIPS shunt and into the main portal vein. Digital subtraction venography was performed. The TIPS shunt was occluded and no significant flow through the shunt was identified. Pressure measurements were performed in the portal vein and in the RIGHT atrium. Over an 0.035 inch Rosen wire, a 12 Fr Penumbra Lightning Flash aspiration catheter was advanced into portal venous confluence and catheter directed thrombectomy was performed. Intermittent and postprocedural portal venograms were performed during thrombus removal. Balloon venoplasty of the TIPS was performed with an 8 mm athletis balloon, dilated to nominal pressure. Completion portal venogram demonstrated a successful dilation of the stent. Post dilatation pressure measurements were obtained following the revision in a similar fashion. Following adequate venographic result, the catheter and sheath were removed and the procedure was terminated. All wires, catheters and sheaths were removed from the patient. Hemostasis was achieved at the RIGHT neck and RIGHT upper abdominal access sites with manual compression. Dressings were applied. The patient tolerated the procedure well without immediate postprocedural complication. FINDINGS: *TIPS thrombosis,  without significant extension to the portal vein. *TIPS mechanical thrombectomy with 12 Fr Penumbra lightning aspiration catheter, and revision venoplasty with 8 mm balloon was performed. *No residual opacification of the previously-embolized gastric varices. *Predominant hepatofugal flow, with preferential flow to the SMV. *Minimal reduction of portosystemic gradient from 30 to 29 mm Hg. *Moderate volume intra-abdominal ascites. Ultrasound-guided paracentesis was performed yielding 600 mL of serosanguineous ascitic fluid. Pre-TIPS revision mean Pressures (mmHg): Right atrium: 0 Portal vein: 30 Portosystemic gradient: 30 Post-TIPS revision mean Pressures (mmHg): Right atrium: 0 Portal vein: 29 Portosystemic gradient: 29 IMPRESSION: 1. Mechanical thrombectomy and revision venoplasty of transjugular intrahepatic portosystemic shunt (TIPS), secondary to thrombosis. No significant extension to the portal vein. 2. Successful ultrasound-guided paracentesis yielding 600 mL of ascitic fluid. 3. Predominant hepatofugal flow, with concern for prolonged TIPS patency. Recommend anticoagulation to avoid re-thrombosis. Thom Hall, MD [facility] Radiology Electronically Signed   By: Thom Hall M.D.   On: 10/16/2024 10:17   IR THROMBECT VENO MECH MOD SED Result Date: 10/16/2024 CLINICAL DATA:  TIPS revision Briefly, 59 year old female with a history of primary biliary cirrhosis with gastric varices and gastro renal shunt who presented with gastric variceal hemorrhage, now s/p TIPS and BRTO (10/10/2024). Acute abdominal pain with repeat CTA (10/14/2024) demonstrating TIPS occlusion. EXAM: Procedures: 1. PORTAL and HEPATIC VENOGRAPHY 2. TRANSJUGULAR INTRAHEPATIC PORTOSYSTEMIC SHUNT (TIPS) THROMBECTOMY and REVISION 3. THERAPEUTIC PARACENTESIS MEDICATIONS: 50 mg Benadryl  IV. ANESTHESIA/SEDATION: Moderate (conscious) sedation was employed during this procedure. A total of Versed   3.5 mg and Fentanyl  125 mcg was administered intravenously. Moderate Sedation Time: 106 minutes. The patient's level of consciousness and vital signs  were monitored continuously by radiology nursing throughout the procedure under my direct supervision. CONTRAST:  90 mL OMNIPAQUE  IOHEXOL  300 MG/ML  SOLN FLUOROSCOPY: Radiation Exposure Index and estimated peak skin dose (PSD); Reference air kerma (RAK), 742.3 mGy. COMPLICATIONS: None immediate. PROCEDURE: Informed written consent was obtained from the patient and/or patient's representative after a thorough discussion of the procedural risks, benefits and alternatives. All questions were addressed. Maximal Sterile Barrier Technique was utilized including caps, mask, sterile gowns, sterile gloves, sterile drape, hand hygiene and skin antiseptic. The skin overlying the RIGHT upper abdominal quadrant as well as the RIGHT neck were prepped and draped in usual sterile fashion. A timeout was performed prior to the initiation of the procedure. PARACENTESIS; Initial ultrasound scanning demonstrates a moderate volume of ascites within the abdomen. Under direct ultrasound guidance, a 7 cm Yueh catheter was introduced. An ultrasound image was saved for documentation purposes. Therapeutic paracentesis was performed. PORTAL VENOGRAPHY; 1% Lidocaine  was used for local anesthesia. Ultrasound evaluation showed a patent RIGHT internal jugular vein. Ultrasound image was saved and sent to PACS. Under ultrasound guidance, the right internal jugular vein was access using a 21-G needle which was dilated and exchanged for a 16 Fr sheath over a guidewire. Under fluoroscopic guidance, utilizing a 5 Fr C2 catheter and 0.035-inch Bentson guidewire manipulation, the catheter was advanced through the TIPS shunt and into the main portal vein. Digital subtraction venography was performed. The TIPS shunt was occluded and no significant flow through the shunt was identified. Pressure measurements  were performed in the portal vein and in the RIGHT atrium. Over an 0.035 inch Rosen wire, a 12 Fr Penumbra Lightning Flash aspiration catheter was advanced into portal venous confluence and catheter directed thrombectomy was performed. Intermittent and postprocedural portal venograms were performed during thrombus removal. Balloon venoplasty of the TIPS was performed with an 8 mm athletis balloon, dilated to nominal pressure. Completion portal venogram demonstrated a successful dilation of the stent. Post dilatation pressure measurements were obtained following the revision in a similar fashion. Following adequate venographic result, the catheter and sheath were removed and the procedure was terminated. All wires, catheters and sheaths were removed from the patient. Hemostasis was achieved at the RIGHT neck and RIGHT upper abdominal access sites with manual compression. Dressings were applied. The patient tolerated the procedure well without immediate postprocedural complication. FINDINGS: *TIPS thrombosis, without significant extension to the portal vein. *TIPS mechanical thrombectomy with 12 Fr Penumbra lightning aspiration catheter, and revision venoplasty with 8 mm balloon was performed. *No residual opacification of the previously-embolized gastric varices. *Predominant hepatofugal flow, with preferential flow to the SMV. *Minimal reduction of portosystemic gradient from 30 to 29 mm Hg. *Moderate volume intra-abdominal ascites. Ultrasound-guided paracentesis was performed yielding 600 mL of serosanguineous ascitic fluid. Pre-TIPS revision mean Pressures (mmHg): Right atrium: 0 Portal vein: 30 Portosystemic gradient: 30 Post-TIPS revision mean Pressures (mmHg): Right atrium: 0 Portal vein: 29 Portosystemic gradient: 29 IMPRESSION: 1. Mechanical thrombectomy and revision venoplasty of transjugular intrahepatic portosystemic shunt (TIPS), secondary to thrombosis. No significant extension to the portal vein. 2.  Successful ultrasound-guided paracentesis yielding 600 mL of ascitic fluid. 3. Predominant hepatofugal flow, with concern for prolonged TIPS patency. Recommend anticoagulation to avoid re-thrombosis. Thom Hall, MD [facility] Electronically Signed   By: Thom Hall M.D.   On: 10/16/2024 10:17        Scheduled Meds:  sodium chloride    Intravenous Once   carvedilol   3.125 mg Oral BID WC  Chlorhexidine  Gluconate Cloth  6 each Topical Daily   insulin  aspart  0-15 Units Subcutaneous TID with meals   lactulose   10 g Oral TID   pantoprazole  (PROTONIX ) IV  40 mg Intravenous Q24H   rifaximin   550 mg Oral BID   zinc  sulfate (50mg  elemental zinc )  220 mg Oral BID   Continuous Infusions:          Sophie Mao, MD [facility] 10/17/2024, 8:33 AM   "

## 2024-10-17 NOTE — Plan of Care (Signed)
" °  Problem: Education: Goal: Ability to describe self-care measures that may prevent or decrease complications (Diabetes Survival Skills Education) will improve Outcome: Progressing   Problem: Coping: Goal: Ability to adjust to condition or change in health will improve Outcome: Progressing   Problem: Skin Integrity: Goal: Risk for impaired skin integrity will decrease Outcome: Progressing   Problem: Pain Managment: Goal: General experience of comfort will improve and/or be controlled Outcome: Progressing   "

## 2024-10-17 NOTE — Progress Notes (Signed)
 Physical Therapy Treatment Patient Details Name: Susan [name] MRN: [id number] DOB: 1966-06-28 Today's Date: 10/17/2024   History of Present Illness Pt is a 59 y.o. female who presented 10/09/24 due to multiple episodes of bloody emesis. CT showed a nodular liver compatible with cirrhosis. Pt was admitted to ICU 1/17-19.  Pt s/p TIPS and coil embolization of posterior and L gastric veins 10/10/24. 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 2. Small volume pelvic and perihepatic ascites, new since previous. 3. Streak artifact from multiple embolization coils limits evaluation of left upper quadrant varices; some visible varices near the GE junction remain  patent. Pending blood transfusion 1/21 at time of PT session. PMH: autoimmune liver cirrhosis, hepatic encephalopathy, esophageal varices, primary biliary cholangitis, HTN, HLD, obesity.    PT Comments  Pt received in supine after just getting back to bed with assist from family members, pt reports continued lightheadedness when standing/walking to bathroom, and compression socks in her room are not a good fit, so PTA measured pt for better fit and new compression socks on order by diplomatic services operational officer after session. Pt given HEP handout with visual/verbal demo of some exercises with minimal teachback, pt fatigued so defers reviewing full handout, agreeable to attempt with family assist between PT sessions. Pt assisted to reposition in bed with modA for scooting toward HOB in supine and bed pad assist, and family agreeable to notify staff if she needs assist OOB, they feel comfortable helping her to BSC/bathroom with RW. Pt continues to benefit from PT services to progress toward functional mobility goals, continue to recommend HHPT if pt able to perform stairs next session. If not, may need to consider alternate DC  location or installation of ramp/stair lift.     If plan is discharge home, recommend the following: A little help with walking and/or transfers;A little help with bathing/dressing/bathroom;Assistance with cooking/housework;Assist for transportation;Help with stairs or ramp for entrance;Supervision due to cognitive status   Can travel by private vehicle        Equipment Recommendations  Rolling walker (2 wheels);BSC/3in1;Other (comment) (pending progress; may need youth height bari RW for hip width)    Recommendations for Other Services       Precautions / Restrictions Precautions Precautions: Fall Recall of Precautions/Restrictions: Intact Precaution/Restrictions Comments: watch BP, symptomatic drop in MAP 1/21 (compression socks ordered 1/24) Restrictions Weight Bearing Restrictions Per Provider Order: No     Mobility  Bed Mobility Overal bed mobility: Needs Assistance             General bed mobility comments: modA posterior supine scooting toward HOB with use of bed rails/LE pushing and bed pad assist; pt defers OOB as she just got back in bed as PTA arriving to her room, but agrees to instruction on HEP, measuring for BLE compression socks and donning of SCDs    Transfers Overall transfer level: Needs assistance                 General transfer comment: pt just returned to bed from ambulating to bathroom with family assist, she defers OOB during session.    Ambulation/Gait                   Stairs             Wheelchair Mobility     Tilt Bed    Modified Rankin (Stroke Patients  Only)       Balance Overall balance assessment: Needs assistance     Sitting balance - Comments: pt defers today due to fatigue       Standing balance comment: pt defers                            Communication Communication Communication: No apparent difficulties  Cognition Arousal: Lethargic Behavior During Therapy: WFL for tasks  assessed/performed   PT - Cognitive impairments: No apparent impairments                       PT - Cognition Comments: pt more drowsy appearing today, but maintains eyes open. Slower processing, per family she had some mild delirium previous day or overnight. Following commands: Impaired Following commands impaired: Follows one step commands with increased time    Cueing Cueing Techniques: Verbal cues, Tactile cues, Gestural cues  Exercises Other Exercises Other Exercises: PTA reviewed supine LE exercises including ankle pumps x10 hourly and given handout to work on quad sets/ SAQ/hip abduction/heel slides between therapy sessions, pt defers to demo back other than AP/quad sets due to fatigue, but receptive to handout info. Can review again next session to answer any questions she may have. Pt defers seated/standing exercises due to fatigue    General Comments General comments (skin integrity, edema, etc.): Pt measured for BLE compression socks as the ones delivered to her room were too large, secretary notified to order new pair. PTA reviewed use of ice for pain control PRN vs alternating heat/ice for comfort. Pt and family shown how to place/remove [facility] lock as she reports she does not have order for [facility] >30 degrees in today      Pertinent Vitals/Pain Pain Assessment Pain Assessment: Faces Faces Pain Scale: Hurts a little bit Pain Location: abdomen/surgical site Pain Descriptors / Indicators: Grimacing Pain Intervention(s): Limited activity within patient's tolerance, Monitored during session, Repositioned    Home Living                          Prior Function            PT Goals (current goals can now be found in the care plan section) Acute Rehab PT Goals Patient Stated Goal: To feel less dizzy so I can walk more and to be able to go back to work. PT Goal Formulation: With patient Time For Goal Achievement: 10/27/24 Progress towards PT goals: Not  progressing toward goals - comment (due to fatigue)    Frequency    Min 2X/week      PT Plan      Co-evaluation              AM-PAC PT 6 Clicks Mobility   Outcome Measure  Help needed turning from your back to your side while in a flat bed without using bedrails?: None Help needed moving from lying on your back to sitting on the side of a flat bed without using bedrails?: A Little Help needed moving to and from a bed to a chair (including a wheelchair)?: A Little Help needed standing up from a chair using your arms (e.g., wheelchair or bedside chair)?: A Little Help needed to walk in hospital room?: A Lot (anticipate need for chair follow) Help needed climbing 3-5 steps with a railing? : Total 6 Click Score: 16    End of Session Equipment Utilized During  Treatment: Other (comment) (bed pad assist) Activity Tolerance: Patient limited by fatigue;Other (comment) (just got back in bed) Patient left: in bed;with call bell/phone within reach;with family/visitor present;with SCD's reapplied;Other (comment) (x3 family present and they request bed alarm off since they have been helping her to bathroom/BSC) Nurse Communication: Mobility status;Other (comment) PT Visit Diagnosis: Difficulty in walking, not elsewhere classified (R26.2);Unsteadiness on feet (R26.81)     Time: 8572-8558 PT Time Calculation (min) (ACUTE ONLY): 14 min  Charges:    $Therapeutic Activity: 8-22 mins PT General Charges $$ ACUTE PT VISIT: 1 Visit                     Takya Vandivier P., PTA Acute Rehabilitation Services Secure Chat Preferred 9a-5:30pm Office: [phone]    Connell HERO [facility] 10/17/2024, 3:20 PM

## 2024-10-17 NOTE — Progress Notes (Signed)
 Subjective: Severe abdominal pain last evening.  Objective: Vital signs in last 24 hours: Temp:  [98.1 F (36.7 C)-98.7 F (37.1 C)] 98.1 F (36.7 C) (01/24 1010) Pulse Rate:  [77-82] 78 (01/24 1010) Resp:  [16-18] 18 (01/24 1010) BP: (103-116)/(51-61) 105/51 (01/24 1010) SpO2:  [99 %-100 %] 100 % (01/24 1010) Weight:  [95.8 kg] 95.8 kg (01/24 0655) Last BM Date : 10/16/24  Intake/Output from previous day: 01/23 0701 - 01/24 0700 In: 780 [P.O.:780] Out: -  Intake/Output this shift: Total I/O In: 240 [P.O.:240] Out: -   General appearance: alert and no distress GI: obese, soft, nontender, nondistended  Lab Results: Recent Labs    10/15/24 0529 10/16/24 0551 10/17/24 1055  WBC 10.8* 13.9* 11.4*  HGB 9.1* 8.9* 8.5*  HCT 26.4* 26.1* 25.6*  PLT 142* 169 165   BMET Recent Labs    10/15/24 0529 10/16/24 0551 10/17/24 1055  NA 133* 131* 128*  K 4.0 4.3 3.8  CL 104 102 100  CO2 20* 21* 19*  GLUCOSE 112* 123* 205*  BUN 50* 47* 52*  CREATININE 1.28* 1.53* 2.24*  CALCIUM  8.2* 8.3* 8.2*   LFT Recent Labs    10/17/24 1055  PROT 5.5*  ALBUMIN 2.8*  AST 36  ALT 17  ALKPHOS 100  BILITOT 1.1   PT/INR Recent Labs    10/15/24 0529  LABPROT 14.9  INR 1.1   Hepatitis Panel No results for input(s): HEPBSAG, HCVAB, HEPAIGM, HEPBIGM in the last 72 hours. C-Diff No results for input(s): CDIFFTOX in the last 72 hours. Fecal Lactopherrin No results for input(s): FECLLACTOFRN in the last 72 hours.  Studies/Results: No results found.  Medications: Scheduled:  sodium chloride    Intravenous Once   carvedilol   3.125 mg Oral BID WC   Chlorhexidine  Gluconate Cloth  6 each Topical Daily   insulin  aspart  0-15 Units Subcutaneous TID with meals   lactulose   10 g Oral TID   pantoprazole  (PROTONIX ) IV  40 mg Intravenous Q24H   rifaximin   550 mg Oral BID   zinc  sulfate (50mg  elemental zinc )  220 mg Oral BID   Continuous:  Assessment/Plan: 1) Anemia -  stable.  S/p TIPS, TIPS revisions, and BRTO. 2) Hepatic encephalopathy - excellent control. 3) AKI - worsened. 4) Nocturnal abdominal pain.   Her main concern was the abdominal pain in the evenings.  The etiology is not clear.  Her electrolytes are essentially normal.  It is improved with using hydromorphone .  Anticholinergics are contraindicated in patient's with decompensated cirrhosis with HE.  She can maintain her use of hydromorphone , for now.  Evaluation of her HE today shows that she is very clear with her mentation.  Her HGB remains stable.  Her AKI worsened overnight.  Plan: 1) Continue with IV hydration and follow creatinine. 2) Hydromorphone  as needed for nocturnal pain. 3) Maintain lactulose , rifaximin , and zinc . 4) Daily CBC. 5) Maintain carvedilol  at 3.125 mg BID.  LOS: 7 days   Susan [name] D 10/17/2024, 2:08 PM

## 2024-10-18 DIAGNOSIS — K922 Gastrointestinal hemorrhage, unspecified: Secondary | ICD-10-CM | POA: Diagnosis not present

## 2024-10-18 LAB — CBC WITH DIFFERENTIAL/PLATELET
Abs Immature Granulocytes: 0.45 10*3/uL — ABNORMAL HIGH (ref 0.00–0.07)
Basophils Absolute: 0.1 10*3/uL (ref 0.0–0.1)
Basophils Relative: 1 %
Eosinophils Absolute: 0.4 10*3/uL (ref 0.0–0.5)
Eosinophils Relative: 4 %
HCT: 23.7 % — ABNORMAL LOW (ref 36.0–46.0)
Hemoglobin: 8.2 g/dL — ABNORMAL LOW (ref 12.0–15.0)
Immature Granulocytes: 4 %
Lymphocytes Relative: 21 %
Lymphs Abs: 2.1 10*3/uL (ref 0.7–4.0)
MCH: 31.4 pg (ref 26.0–34.0)
MCHC: 34.6 g/dL (ref 30.0–36.0)
MCV: 90.8 fL (ref 80.0–100.0)
Monocytes Absolute: 1.3 10*3/uL — ABNORMAL HIGH (ref 0.1–1.0)
Monocytes Relative: 13 %
Neutro Abs: 5.9 10*3/uL (ref 1.7–7.7)
Neutrophils Relative %: 57 %
Platelets: 150 10*3/uL (ref 150–400)
RBC: 2.61 MIL/uL — ABNORMAL LOW (ref 3.87–5.11)
RDW: 14.5 % (ref 11.5–15.5)
WBC: 10.2 10*3/uL (ref 4.0–10.5)
nRBC: 0 % (ref 0.0–0.2)

## 2024-10-18 LAB — COMPREHENSIVE METABOLIC PANEL WITH GFR
ALT: 17 U/L (ref 0–44)
AST: 33 U/L (ref 15–41)
Albumin: 2.6 g/dL — ABNORMAL LOW (ref 3.5–5.0)
Alkaline Phosphatase: 100 U/L (ref 38–126)
Anion gap: 8 (ref 5–15)
BUN: 55 mg/dL — ABNORMAL HIGH (ref 6–20)
CO2: 20 mmol/L — ABNORMAL LOW (ref 22–32)
Calcium: 8.1 mg/dL — ABNORMAL LOW (ref 8.9–10.3)
Chloride: 98 mmol/L (ref 98–111)
Creatinine, Ser: 2.56 mg/dL — ABNORMAL HIGH (ref 0.44–1.00)
GFR, Estimated: 21 mL/min — ABNORMAL LOW
Glucose, Bld: 111 mg/dL — ABNORMAL HIGH (ref 70–99)
Potassium: 3.9 mmol/L (ref 3.5–5.1)
Sodium: 127 mmol/L — ABNORMAL LOW (ref 135–145)
Total Bilirubin: 1.1 mg/dL (ref 0.0–1.2)
Total Protein: 5.2 g/dL — ABNORMAL LOW (ref 6.5–8.1)

## 2024-10-18 LAB — GLUCOSE, CAPILLARY
Glucose-Capillary: 125 mg/dL — ABNORMAL HIGH (ref 70–99)
Glucose-Capillary: 189 mg/dL — ABNORMAL HIGH (ref 70–99)
Glucose-Capillary: 180 mg/dL — ABNORMAL HIGH (ref 70–99)

## 2024-10-18 LAB — MAGNESIUM: Magnesium: 2.3 mg/dL (ref 1.7–2.4)

## 2024-10-18 MED ORDER — PANTOPRAZOLE SODIUM 40 MG PO TBEC
40.0000 mg | DELAYED_RELEASE_TABLET | Freq: Every day | ORAL | Status: DC
  Administered 2024-10-19 – 2024-10-22 (×4): 40 mg via ORAL
  Filled 2024-10-18 (×4): qty 1

## 2024-10-18 MED ORDER — SODIUM CHLORIDE 0.9 % IV SOLN: INTRAVENOUS | Status: DC

## 2024-10-18 NOTE — Progress Notes (Signed)
 " PROGRESS NOTE    Susan [name]  FMW:[id number] DOB: [birth year] DOA: 10/09/2024 PCP: Corwin Antu, FNP   Brief Narrative:  59 year old female with history of alcoholic cirrhosis, esophageal varices, primary biliary cholangitis, hepatic encephalopathy, hypertension, hyperlipidemia, obesity and hospitalization in December 2025 for hepatic encephalopathy presented with hematemesis.  She was initially admitted to ICU and intubated for airway protection she was transfused packed red cells.  She underwent EGD on 10/10/2024 followed by TIPS.  She was extubated on 10/11/2024.  Care has been transferred to TRH service from 10/13/2024.  She has had 2 unit packed red cells transfusion so far during the hospitalization.  She underwent TIPS thrombectomy and revision and therapeutic paracentesis with removal of 600 cc fluid by IR on 10/15/2024.  Assessment & Plan:   Hemorrhagic shock Upper GI bleeding from variceal bleeding Acute blood loss anemia - Has received 2 unit packed red cells during this hospitalization: Latest on 10/14/2024: Transfused 1 unit packed red cells for hemoglobin of 6.6.  Hemoglobin 8.2 this morning.  Monitor H&H -underwent EGD on 10/10/2024: Large amount of clotted blood found in the stomach along with type I gastric varices -s/p TIPS on 10/10/2024: Coil embolization of posterior and left gastric veins, and coil assisted retrograde transvenous obliteration of gastrorenal shunt.  IR following.  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  Patient underwent TIPS thrombectomy and revision and therapeutic paracentesis with removal of 600 cc fluid by IR on 10/15/2024 -GI following: Currently on IV Protonix .  Off octreotide  drip.  Completed 5-day course of Rocephin . - Diet advancement as per GI  Alcoholic cirrhosis Hepatic encephalopathy - GI following.  Continue rifaximin  and lactulose  monitor mental status  AKI - Possibly prerenal.   Creatinine has worsened to 2.56 today.  Start IV fluids. Monitor  Hypokalemia - Improved  Hypocalcemia - Improved  Hyponatremia - Sodium 127 today.  IV fluids plan as above.  Leukocytosis - Resolved  Thrombocytopenia - Resolved  Acute metabolic acidosis - Mild.  Monitor  Acute hypoxic respiratory failure -Status post intubation on admission and extubated on 10/11/2024.  Currently on room air  Diabetes mellitus type 2 -Continue CBGs with SSI  Obesity class III - Outpatient follow-up  Physical deconditioning - Will need home health PT/OT    DVT prophylaxis: SCDs.   Code Status: Full Family Communication: Husband at bedside Disposition Plan: Status is: Inpatient Remains inpatient appropriate because: Of severity of illness    Consultants: GI/IR  Procedures: As above  Antimicrobials:  Anti-infectives (From admission, onward)    Start     Dose/Rate Route Frequency Ordered Stop   10/14/24 0915  cefTRIAXone  (ROCEPHIN ) 1 g in sodium chloride  0.9 % 100 mL IVPB        1 g 200 mL/hr over 30 Minutes Intravenous Every 24 hours 10/14/24 0829 10/14/24 0959   10/11/24 1345  rifaximin  (XIFAXAN ) tablet 550 mg        550 mg Oral 2 times daily 10/11/24 1256     10/10/24 0830  cefTRIAXone  (ROCEPHIN ) 1 g in sodium chloride  0.9 % 100 mL IVPB        1 g 200 mL/hr over 30 Minutes Intravenous Every 24 hours 10/10/24 0730 10/13/24 9073        Subjective: Patient seen and examined at bedside.  Continues to have intermittent abdominal pain.  No fever, vomiting reported. objective: Vitals:   10/17/24 1010 10/17/24 1707 10/17/24 1949 10/17/24 2149  BP: (!) 105/51 (!) 104/49 (!) 96/52 (!) 120/51  Pulse: 78 72 75 77  Resp: 18 18 16    Temp: 98.1 F (36.7 C)  98.7 F (37.1 C)   TempSrc:   Oral   SpO2: 100% 92% 100%   Weight:      Height:        Intake/Output Summary (Last 24 hours) at 10/18/2024 0846 Last data filed at 10/17/2024 1800 Gross per 24 hour  Intake 360 ml   Output --  Net 360 ml   Filed Weights   10/15/24 0500 10/16/24 0500 10/17/24 0655  Weight: 97 kg 97.1 kg 95.8 kg    Examination:  General: Remains on room air and in no distress.  Looks chronically ill and deconditioned ENT/neck: No palpable thyromegaly or neck masses noted respiratory: Decreased breath sounds at bases bilaterally with scattered crackles CVS: Rate mostly controlled; S1 and S2 are heard  abdominal: Soft, morbidly obese, remains distended and slightly tender; no organomegaly, bowel sounds are normally heard Extremities: No clubbing; trace lower extremity edema present CNS: Awake, still slow to respond but answers some questions appropriately.  No focal deficits currently  lymph: No palpable lymphadenopathy noted  skin: No obvious petechiae/rashes  psych: Mostly flat affect with no signs of agitation  musculoskeletal: No obvious joint tenderness/erythema Data Reviewed: I have personally reviewed following labs and imaging studies  CBC: Recent Labs  Lab 10/14/24 0526 10/15/24 0529 10/16/24 0551 10/17/24 1055 10/18/24 0536  WBC 12.9* 10.8* 13.9* 11.4* 10.2  NEUTROABS 7.6 6.3 9.0* 8.0* 5.9  HGB 6.6* 9.1* 8.9* 8.5* 8.2*  HCT 19.5* 26.4* 26.1* 25.6* 23.7*  MCV 94.7 92.0 94.2 93.4 90.8  PLT 123* 142* 169 165 150   Basic Metabolic Panel: Recent Labs  Lab 10/12/24 0551 10/12/24 0557 10/14/24 0526 10/15/24 0529 10/16/24 0551 10/17/24 1055 10/18/24 0536  NA  --    < > 133* 133* 131* 128* 127*  K  --    < > 2.7* 4.0 4.3 3.8 3.9  CL  --    < > 107 104 102 100 98  CO2  --    < > 17* 20* 21* 19* 20*  GLUCOSE  --    < > 102* 112* 123* 205* 111*  BUN  --    < > 56* 50* 47* 52* 55*  CREATININE  --    < > 1.31* 1.28* 1.53* 2.24* 2.56*  CALCIUM   --    < > 5.8* 8.2* 8.3* 8.2* 8.1*  MG 1.7  --  1.7 2.3 2.3 2.2 2.3  PHOS 4.2  --   --   --   --   --   --    < > = values in this interval not displayed.   GFR: Estimated Creatinine Clearance: 24.8 mL/min (A) (by C-G  formula based on SCr of 2.56 mg/dL (H)). Liver Function Tests: Recent Labs  Lab 10/14/24 0526 10/15/24 0529 10/16/24 0551 10/17/24 1055 10/18/24 0536  AST 37 39 40 36 33  ALT 21 24 20 17 17   ALKPHOS 76 96 97 100 100  BILITOT 0.7 1.2 1.2 1.1 1.1  PROT 4.3* 5.7* 5.7* 5.5* 5.2*  ALBUMIN 2.2* 2.8* 2.7* 2.8* 2.6*   No results for input(s): LIPASE, AMYLASE in the last 168 hours.  Recent Labs  Lab 10/12/24 0557 10/15/24 0529 10/17/24 1055  AMMONIA 42* 48* 34   Coagulation Profile: Recent Labs  Lab 10/11/24 0905 10/15/24 0529  INR 1.3* 1.1   Cardiac Enzymes:  No results for input(s): CKTOTAL, CKMB, CKMBINDEX, TROPONINI in the last 168 hours. BNP (last 3 results) No results for input(s): PROBNP in the last 8760 hours. HbA1C: No results for input(s): HGBA1C in the last 72 hours. CBG: Recent Labs  Lab 10/16/24 1744 10/17/24 0758 10/17/24 1212 10/17/24 1706 10/18/24 0833  GLUCAP 135* 135* 214* 161* 125*   Lipid Profile: No results for input(s): CHOL, HDL, LDLCALC, TRIG, CHOLHDL, LDLDIRECT in the last 72 hours.  Thyroid  Function Tests: No results for input(s): TSH, T4TOTAL, FREET4, T3FREE, THYROIDAB in the last 72 hours. Anemia Panel: No results for input(s): VITAMINB12, FOLATE, FERRITIN, TIBC, IRON, RETICCTPCT in the last 72 hours. Sepsis Labs: No results for input(s): PROCALCITON, LATICACIDVEN in the last 168 hours.  Recent Results (from the past 240 hours)  MRSA Next Gen by PCR, Nasal     Status: None   Collection Time: 10/10/24  4:17 AM   Specimen: Nasal Mucosa; Nasal Swab  Result Value Ref Range Status   MRSA by PCR Next Gen NOT DETECTED NOT DETECTED Final    Comment: (NOTE) The GeneXpert MRSA Assay (FDA approved for NASAL specimens only), is one component of a comprehensive MRSA colonization surveillance program. It is not intended to diagnose MRSA infection nor to guide or monitor treatment for MRSA  infections. Test performance is not FDA approved in patients less than 73 years old. Performed at [facility], [address], [place], [place] [address]          Radiology Studies: No results found.       Scheduled Meds:  sodium chloride    Intravenous Once   carvedilol   3.125 mg Oral BID WC   Chlorhexidine  Gluconate Cloth  6 each Topical Daily   insulin  aspart  0-15 Units Subcutaneous TID with meals   lactulose   10 g Oral TID   pantoprazole  (PROTONIX ) IV  40 mg Intravenous Q24H   rifaximin   550 mg Oral BID   zinc  sulfate (50mg  elemental zinc )  220 mg Oral BID   Continuous Infusions:          Sophie Mao, MD [facility] 10/18/2024, 8:46 AM   "

## 2024-10-18 NOTE — Plan of Care (Signed)
" °  Problem: Education: Goal: Ability to describe self-care measures that may prevent or decrease complications (Diabetes Survival Skills Education) will improve Outcome: Progressing   Problem: Coping: Goal: Ability to adjust to condition or change in health will improve Outcome: Progressing   Problem: Skin Integrity: Goal: Risk for impaired skin integrity will decrease Outcome: Progressing   Problem: Pain Managment: Goal: General experience of comfort will improve and/or be controlled Outcome: Progressing   Problem: Safety: Goal: Ability to remain free from injury will improve Outcome: Progressing   Problem: Activity: Goal: Ability to return to baseline activity level will improve Outcome: Progressing   "

## 2024-10-18 NOTE — Progress Notes (Signed)
 Subjective: Pain controlled with medication.  Better night last evening.  Objective: Vital signs in last 24 hours: Temp:  [98.7 F (37.1 C)] 98.7 F (37.1 C) (01/24 1949) Pulse Rate:  [72-77] 77 (01/24 2149) Resp:  [16-18] 16 (01/24 1949) BP: (96-120)/(49-52) 120/51 (01/24 2149) SpO2:  [92 %-100 %] 100 % (01/24 1949) Last BM Date : 10/16/24  Intake/Output from previous day: 01/24 0701 - 01/25 0700 In: 600 [P.O.:600] Out: -  Intake/Output this shift: No intake/output data recorded.  General appearance: alert and no distress GI: soft, distended (unchanged), nontender  Lab Results: Recent Labs    10/16/24 0551 10/17/24 1055 10/18/24 0536  WBC 13.9* 11.4* 10.2  HGB 8.9* 8.5* 8.2*  HCT 26.1* 25.6* 23.7*  PLT 169 165 150   BMET Recent Labs    10/16/24 0551 10/17/24 1055 10/18/24 0536  NA 131* 128* 127*  K 4.3 3.8 3.9  CL 102 100 98  CO2 21* 19* 20*  GLUCOSE 123* 205* 111*  BUN 47* 52* 55*  CREATININE 1.53* 2.24* 2.56*  CALCIUM  8.3* 8.2* 8.1*   LFT Recent Labs    10/18/24 0536  PROT 5.2*  ALBUMIN 2.6*  AST 33  ALT 17  ALKPHOS 100  BILITOT 1.1   PT/INR No results for input(s): LABPROT, INR in the last 72 hours. Hepatitis Panel No results for input(s): HEPBSAG, HCVAB, HEPAIGM, HEPBIGM in the last 72 hours. C-Diff No results for input(s): CDIFFTOX in the last 72 hours. Fecal Lactopherrin No results for input(s): FECLLACTOFRN in the last 72 hours.  Studies/Results: No results found.  Medications: Scheduled:  sodium chloride    Intravenous Once   carvedilol   3.125 mg Oral BID WC   Chlorhexidine  Gluconate Cloth  6 each Topical Daily   insulin  aspart  0-15 Units Subcutaneous TID with meals   lactulose   10 g Oral TID   pantoprazole  (PROTONIX ) IV  40 mg Intravenous Q24H   rifaximin   550 mg Oral BID   zinc  sulfate (50mg  elemental zinc )  220 mg Oral BID   Continuous:  sodium chloride  75 mL/hr at 10/18/24 0944    Assessment/Plan: 1)  Hepatic encephalopathy - controlled. 2) Anemia - stable. 3) Worsening creatinine - multifactorial etiology.   Clinically she appears well, however, her creatinine continues to worsen.  At this point will be beneficial to obtain a Renal consultation.  This worsening is most likely multifactorial, ie, contrast for the IR interventions, cirrhosis/ascites, and intermittent hypotension.  Plan: 1) Renal consultation. 2) Continue with the rifaximin , zinc , and lactulose . 3) Change pantoprazole  to oral. 4) Advance to a 2 gram sodium diet.   LOS: 8 days   Iokepa Geffre D 10/18/2024, 10:23 AM

## 2024-10-19 DIAGNOSIS — D649 Anemia, unspecified: Secondary | ICD-10-CM

## 2024-10-19 DIAGNOSIS — I864 Gastric varices: Secondary | ICD-10-CM

## 2024-10-19 DIAGNOSIS — R1031 Right lower quadrant pain: Secondary | ICD-10-CM

## 2024-10-19 DIAGNOSIS — N179 Acute kidney failure, unspecified: Secondary | ICD-10-CM | POA: Diagnosis not present

## 2024-10-19 DIAGNOSIS — K743 Primary biliary cirrhosis: Secondary | ICD-10-CM | POA: Diagnosis not present

## 2024-10-19 DIAGNOSIS — K920 Hematemesis: Secondary | ICD-10-CM | POA: Diagnosis not present

## 2024-10-19 DIAGNOSIS — E871 Hypo-osmolality and hyponatremia: Secondary | ICD-10-CM | POA: Diagnosis not present

## 2024-10-19 DIAGNOSIS — K7682 Hepatic encephalopathy: Secondary | ICD-10-CM

## 2024-10-19 DIAGNOSIS — R1032 Left lower quadrant pain: Secondary | ICD-10-CM | POA: Diagnosis not present

## 2024-10-19 DIAGNOSIS — R1030 Lower abdominal pain, unspecified: Secondary | ICD-10-CM

## 2024-10-19 DIAGNOSIS — K922 Gastrointestinal hemorrhage, unspecified: Secondary | ICD-10-CM | POA: Diagnosis not present

## 2024-10-19 LAB — SURGICAL PATHOLOGY

## 2024-10-19 LAB — CBC WITH DIFFERENTIAL/PLATELET
Abs Immature Granulocytes: 0.39 10*3/uL — ABNORMAL HIGH (ref 0.00–0.07)
Basophils Absolute: 0.1 10*3/uL (ref 0.0–0.1)
Basophils Relative: 1 %
Eosinophils Absolute: 0.3 10*3/uL (ref 0.0–0.5)
Eosinophils Relative: 4 %
HCT: 24.6 % — ABNORMAL LOW (ref 36.0–46.0)
Hemoglobin: 8.3 g/dL — ABNORMAL LOW (ref 12.0–15.0)
Immature Granulocytes: 4 %
Lymphocytes Relative: 18 %
Lymphs Abs: 1.7 10*3/uL (ref 0.7–4.0)
MCH: 31.1 pg (ref 26.0–34.0)
MCHC: 33.7 g/dL (ref 30.0–36.0)
MCV: 92.1 fL (ref 80.0–100.0)
Monocytes Absolute: 1.4 10*3/uL — ABNORMAL HIGH (ref 0.1–1.0)
Monocytes Relative: 15 %
Neutro Abs: 5.5 10*3/uL (ref 1.7–7.7)
Neutrophils Relative %: 58 %
Platelets: 150 10*3/uL (ref 150–400)
RBC: 2.67 MIL/uL — ABNORMAL LOW (ref 3.87–5.11)
RDW: 14.5 % (ref 11.5–15.5)
WBC: 9.4 10*3/uL (ref 4.0–10.5)
nRBC: 0 % (ref 0.0–0.2)

## 2024-10-19 LAB — COMPREHENSIVE METABOLIC PANEL WITH GFR
ALT: 16 U/L (ref 0–44)
AST: 37 U/L (ref 15–41)
Albumin: 2.5 g/dL — ABNORMAL LOW (ref 3.5–5.0)
Alkaline Phosphatase: 115 U/L (ref 38–126)
Anion gap: 11 (ref 5–15)
BUN: 53 mg/dL — ABNORMAL HIGH (ref 6–20)
CO2: 17 mmol/L — ABNORMAL LOW (ref 22–32)
Calcium: 8 mg/dL — ABNORMAL LOW (ref 8.9–10.3)
Chloride: 100 mmol/L (ref 98–111)
Creatinine, Ser: 2.7 mg/dL — ABNORMAL HIGH (ref 0.44–1.00)
GFR, Estimated: 20 mL/min — ABNORMAL LOW
Glucose, Bld: 108 mg/dL — ABNORMAL HIGH (ref 70–99)
Potassium: 3.9 mmol/L (ref 3.5–5.1)
Sodium: 128 mmol/L — ABNORMAL LOW (ref 135–145)
Total Bilirubin: 1 mg/dL (ref 0.0–1.2)
Total Protein: 5.2 g/dL — ABNORMAL LOW (ref 6.5–8.1)

## 2024-10-19 LAB — TSH: TSH: 5.91 u[IU]/mL — ABNORMAL HIGH (ref 0.350–4.500)

## 2024-10-19 LAB — MAGNESIUM: Magnesium: 2.2 mg/dL (ref 1.7–2.4)

## 2024-10-19 LAB — GLUCOSE, CAPILLARY
Glucose-Capillary: 122 mg/dL — ABNORMAL HIGH (ref 70–99)
Glucose-Capillary: 183 mg/dL — ABNORMAL HIGH (ref 70–99)
Glucose-Capillary: 160 mg/dL — ABNORMAL HIGH (ref 70–99)

## 2024-10-19 LAB — OSMOLALITY, URINE: Osmolality, Ur: 330 mosm/kg (ref 300–900)

## 2024-10-19 LAB — CREATININE, URINE, RANDOM: Creatinine, Urine: 160 mg/dL

## 2024-10-19 LAB — NA AND K (SODIUM & POTASSIUM), RAND UR
Potassium Urine: 15 mmol/L
Sodium, Ur: <30 mmol/L

## 2024-10-19 LAB — CORTISOL: Cortisol, Plasma: 17.3 ug/dL

## 2024-10-19 MED ORDER — SODIUM BICARBONATE 650 MG PO TABS
650.0000 mg | ORAL_TABLET | Freq: Two times a day (BID) | ORAL | Status: DC
  Administered 2024-10-19 – 2024-10-22 (×7): 650 mg via ORAL
  Filled 2024-10-19 (×7): qty 1

## 2024-10-19 NOTE — Progress Notes (Signed)
 "   Referring Physician(s): Dr. 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 Jennefer on 10/10/24. S/p TIPS revision/thrombectomy 1/22  Subjective: Sitting up in chair.   Alert, conversant, and oriented.  Having several bowel movements today. Still complaining of intermittent, LUQ cramping which is worse at night.   Allergies: Mushroom extract complex (obsolete), Grapefruit concentrate, Mounjaro  [tirzepatide ], Sulfa antibiotics, Amlodipine, Crestor  [rosuvastatin ], Shellfish allergy, Simvastatin , Strawberry extract, and Metformin  and related  Medications: Prior to Admission medications  Medication Sig Start Date End Date Taking? Authorizing Provider  carvedilol  (COREG ) 3.125 MG tablet Take 3.125 mg by mouth 2 (two) times daily.   Yes [provider]  hydrochlorothiazide  (HYDRODIURIL ) 25 MG tablet TAKE 1 TABLET BY MOUTH DAILY 01/07/24  Yes Dugal, Tabitha, FNP  lactulose  (CHRONULAC ) 10 GM/15ML solution Take 45 mLs (30 g total) by mouth 2 (two) times daily. 08/29/24  Yes Mdala-Gausi, Masiku Agatha, MD  spironolactone  (ALDACTONE ) 50 MG tablet Take 1 tablet (50 mg total) by mouth daily. 09/07/24  Yes Dugal, Ginger, FNP  ursodiol  (ACTIGALL ) 300 MG capsule TAKE 2 CAPSULES BY MOUTH TWICE  DAILY 10/22/23  Yes Armbruster, Elspeth SQUIBB, MD     Vital Signs: BP (!) 109/53 (BP Location: Left Arm)   Pulse 71   Temp 98.3 F (36.8 C) (Oral)   Resp 16   Ht 5' (1.524 m)   Wt 226 lb 3.1 oz (102.6 kg)   SpO2 100%   BMI 44.18 kg/m   Physical Exam NAD, alert, communicative Abdomen: soft, non-tender to palpation Skin: internal jugular site covered with large dressing that is clean and dry.  Neuro: oriented, conversant. Asking appropriate questions.   Imaging: No results found.   Labs:  CBC: Recent Labs     10/16/24 0551 10/17/24 1055 10/18/24 0536 10/19/24 0405  WBC 13.9* 11.4* 10.2 9.4  HGB 8.9* 8.5* 8.2* 8.3*  HCT 26.1* 25.6* 23.7* 24.6*  PLT 169 165 150 150    COAGS: Recent Labs    08/29/24 0553 09/03/24 1419 10/10/24 0040 10/11/24 0905 10/15/24 0529  INR 1.3* 1.4* 1.1 1.3* 1.1  APTT 35  --  24  --   --     BMP: Recent Labs    10/16/24 0551 10/17/24 1055 10/18/24 0536 10/19/24 0405  NA 131* 128* 127* 128*  K 4.3 3.8 3.9 3.9  CL 102 100 98 100  CO2 21* 19* 20* 17*  GLUCOSE 123* 205* 111* 108*  BUN 47* 52* 55* 53*  CALCIUM  8.3* 8.2* 8.1* 8.0*  CREATININE 1.53* 2.24* 2.56* 2.70*  GFRNONAA 39* 25* 21* 20*    LIVER FUNCTION TESTS: Recent Labs    10/16/24 0551 10/17/24 1055 10/18/24 0536 10/19/24 0405  BILITOT 1.2 1.1 1.1 1.0  AST 40 36 33 37  ALT 20 17 17 16   ALKPHOS 97 100 100 115  PROT 5.7* 5.5* 5.2* 5.2*  ALBUMIN 2.7* 2.8* 2.6* 2.5*    Assessment and Plan: 59 y.o. female with PBC/AIH cirrhosis, UGIB secondary to gastric varices, TIPS, coil embolization of posterior and left gastric veins, and BRTO by Dr. Jennefer on 10/10/24.  S/p TIPS thormbectomy 1/22 for near total occlusion.  Procedure sites intact and healing well.  Internal jugular dressing may come off with next site check. Discussed with RN.  Much more alert and conversant today-- also note she  took her first dose of lactulose  today after refusing all weekend doses.  Has continued on rifaximin  BID.  Nephrology on board following for renal recovery.   Continue current management.   IR will follow-up periodically to monitor status.   Electronically Signed: Ruari Duggan Sue-Ellen Sydni Elizarraraz, PA 10/19/2024, 2:45 PM   I spent a total of 15 Minutes at the the patient's bedside AND on the patient's hospital floor or unit, greater than 50% of which was counseling/coordinating care for cirrhosis with varices, GI bleed.       "

## 2024-10-19 NOTE — Consult Note (Signed)
 Reason for Consult: Renal failure Referring Physician: Dr. Cheryle  Chief Complaint: Hematemesis  Assessment/Plan:  Renal failure with normal renal function prior to hospitalization; acute kidney injury secondary to contrast induced nephropathy with necessary contrast given 3x on 1/17 (CT) with e/o CIN + possible ischemic ATN from hypotension but recovery as well with peak Cr 2.65 improving to 1.31, 1/21 (CTA) and 1/22 (thrombectomy). - Fortunately no indication for RRT and will monitor + follow closely with you; supportive therapy for now. Appetite is starting to improve and will stop the IVF after the current bag is completed. Spouse bedside updated. - Start HCO3 650mg  BID  - Dose medications for eGFR <87ml/min -Monitor Daily I/Os, Daily weight  -Maintain MAP>65 for optimal renal perfusion.  - Avoid nephrotoxic agents such as IV contrast, NSAIDs, and phosphate containing bowel preps (FLEETS)  Hemorrhagic shock requiring PRBC transfusion, levophed  earlier in the hospitalization s/p TIPS as well as TIPS thrombectomy. Hb 8.3. Hepatic encephalopathy on rifaximin  and lactulose ; diarrhea from the lactulose . Hyponatremia secondary to cirrhosis - will also send urine studies, cortisol and TSH T2DM on SSI Hypoxic respirator failure initially intubated for airway protection   HPI: Susan [name] is an 59 y.o. female with a PMH significant for hypertension, autoimmune liver cirrhosis, hepatic encephalopathy, EV, PBC, HLD, T2DM, obesity presenting with hematemesis requiring blood transfusion, CT A/P showed cirrhosis. She was intubated for airway protection, EGD 1/17 (large amount of clot) follow by TIPS with hospital course complicated by thrombosis of main protal vein leading to TIPS thrombectomy + paracentesis on 1/22.  Patient completed course with Rocephin  and was also on Levophed  earlier in the hospitalization.  Patient received contrast 3x on 1/17 (CT), 1/21 (CTA) and 1/22 (thrombectomy). Patient  is +5.2L during this hospitalization.  ROS Pertinent items are noted in HPI.  Chemistry and CBC: Creatinine, Ser  Date/Time Value Ref Range Status  10/19/2024 04:05 AM 2.70 (H) 0.44 - 1.00 mg/dL Final  98/74/7973 94:63 AM 2.56 (H) 0.44 - 1.00 mg/dL Final  98/75/7973 89:44 AM 2.24 (H) 0.44 - 1.00 mg/dL Final  98/76/7973 94:48 AM 1.53 (H) 0.44 - 1.00 mg/dL Final  98/77/7973 94:70 AM 1.28 (H) 0.44 - 1.00 mg/dL Final  98/78/7973 94:73 AM 1.31 (H) 0.44 - 1.00 mg/dL Final  98/79/7973 88:72 AM 2.13 (H) 0.44 - 1.00 mg/dL Final  98/80/7973 94:42 AM 2.65 (H) 0.44 - 1.00 mg/dL Final  98/81/7973 90:94 AM 2.30 (H) 0.44 - 1.00 mg/dL Final  98/82/7973 92:50 PM 1.41 (H) 0.44 - 1.00 mg/dL Final  98/82/7973 92:45 AM 1.10 (H) 0.44 - 1.00 mg/dL Final  98/83/7973 88:51 PM 1.00 0.44 - 1.00 mg/dL Final  87/88/7974 97:80 PM 0.94 0.40 - 1.20 mg/dL Final  87/93/7974 94:46 AM 0.96 0.44 - 1.00 mg/dL Final  87/94/7974 89:45 AM 1.18 (H) 0.44 - 1.00 mg/dL Final  97/90/7975 88:68 AM 0.74 0.40 - 1.20 mg/dL Final  96/70/7978 87:63 PM 0.80 0.57 - 1.00 mg/dL Final  89/72/7979 87:78 PM 0.73 0.40 - 1.20 mg/dL Final  89/75/7980 91:77 AM 0.88 0.40 - 1.20 mg/dL Final  95/88/7980 96:76 PM 0.83 0.40 - 1.20 mg/dL Final  88/93/7981 91:72 AM 0.85 0.40 - 1.20 mg/dL Final  91/97/7981 91:73 AM 0.87 0.40 - 1.20 mg/dL Final  94/98/7981 88:78 AM 0.74 0.40 - 1.20 mg/dL Final  94/68/7984 96:89 PM 0.70 0.50 - 1.10 mg/dL Final  88/81/7985 92:72 AM 0.90 0.50 - 1.10 mg/dL Final   Recent Labs  Lab 10/13/24 1127 10/14/24 0526 10/15/24 0529 10/16/24 0551 10/17/24 1055  10/18/24 0536 10/19/24 0405  NA 133* 133* 133* 131* 128* 127* 128*  K 3.7 2.7* 4.0 4.3 3.8 3.9 3.9  CL 102 107 104 102 100 98 100  CO2 23 17* 20* 21* 19* 20* 17*  GLUCOSE 152* 102* 112* 123* 205* 111* 108*  BUN 78* 56* 50* 47* 52* 55* 53*  CREATININE 2.13* 1.31* 1.28* 1.53* 2.24* 2.56* 2.70*  CALCIUM  7.8* 5.8* 8.2* 8.3* 8.2* 8.1* 8.0*   Recent Labs  Lab  10/16/24 0551 10/17/24 1055 10/18/24 0536 10/19/24 0405  WBC 13.9* 11.4* 10.2 9.4  NEUTROABS 9.0* 8.0* 5.9 5.5  HGB 8.9* 8.5* 8.2* 8.3*  HCT 26.1* 25.6* 23.7* 24.6*  MCV 94.2 93.4 90.8 92.1  PLT 169 165 150 150   Liver Function Tests: Recent Labs  Lab 10/17/24 1055 10/18/24 0536 10/19/24 0405  AST 36 33 37  ALT 17 17 16   ALKPHOS 100 100 115  BILITOT 1.1 1.1 1.0  PROT 5.5* 5.2* 5.2*  ALBUMIN 2.8* 2.6* 2.5*   No results for input(s): LIPASE, AMYLASE in the last 168 hours. Recent Labs  Lab 10/15/24 0529 10/17/24 1055  AMMONIA 48* 34   Cardiac Enzymes: No results for input(s): CKTOTAL, CKMB, CKMBINDEX, TROPONINI in the last 168 hours. Iron Studies: No results for input(s): IRON, TIBC, TRANSFERRIN, FERRITIN in the last 72 hours. PT/INR: @LABRCNTIP (inr:5)  Xrays/Other Studies: ) Results for orders placed or performed during the hospital encounter of 10/09/24 (from the past 48 hours)  CBC with Differential/Platelet     Status: Abnormal   Collection Time: 10/17/24 10:55 AM  Result Value Ref Range   WBC 11.4 (H) 4.0 - 10.5 K/uL   RBC 2.74 (L) 3.87 - 5.11 MIL/uL   Hemoglobin 8.5 (L) 12.0 - 15.0 g/dL   HCT 74.3 (L) 63.9 - 53.9 %   MCV 93.4 80.0 - 100.0 fL   MCH 31.0 26.0 - 34.0 pg   MCHC 33.2 30.0 - 36.0 g/dL   RDW 85.1 88.4 - 84.4 %   Platelets 165 150 - 400 K/uL   nRBC 0.4 (H) 0.0 - 0.2 %   Neutrophils Relative % 70 %   Neutro Abs 8.0 (H) 1.7 - 7.7 K/uL   Lymphocytes Relative 13 %   Lymphs Abs 1.4 0.7 - 4.0 K/uL   Monocytes Relative 10 %   Monocytes Absolute 1.1 (H) 0.1 - 1.0 K/uL   Eosinophils Relative 2 %   Eosinophils Absolute 0.3 0.0 - 0.5 K/uL   Basophils Relative 0 %   Basophils Absolute 0.0 0.0 - 0.1 K/uL   Immature Granulocytes 5 %   Abs Immature Granulocytes 0.56 (H) 0.00 - 0.07 K/uL    Comment: Performed at [facility], [address], [place], [place] [address]  Comprehensive metabolic panel with GFR     Status: Abnormal    Collection Time: 10/17/24 10:55 AM  Result Value Ref Range   Sodium 128 (L) 135 - 145 mmol/L   Potassium 3.8 3.5 - 5.1 mmol/L   Chloride 100 98 - 111 mmol/L   CO2 19 (L) 22 - 32 mmol/L   Glucose, Bld 205 (H) 70 - 99 mg/dL    Comment: Glucose reference range applies only to samples taken after fasting for at least 8 hours.   BUN 52 (H) 6 - 20 mg/dL   Creatinine, Ser 7.75 (H) 0.44 - 1.00 mg/dL   Calcium  8.2 (L) 8.9 - 10.3 mg/dL   Total Protein 5.5 (L) 6.5 - 8.1 g/dL   Albumin  2.8 (L) 3.5 - 5.0 g/dL   AST 36 15 - 41 U/L   ALT 17 0 - 44 U/L   Alkaline Phosphatase 100 38 - 126 U/L   Total Bilirubin 1.1 0.0 - 1.2 mg/dL   GFR, Estimated 25 (L) >60 mL/min    Comment: (NOTE) Calculated using the CKD-EPI Creatinine Equation (2021)    Anion gap 9 5 - 15    Comment: Performed at [facility], [address], [place], [place] [address]  Magnesium      Status: None   Collection Time: 10/17/24 10:55 AM  Result Value Ref Range   Magnesium  2.2 1.7 - 2.4 mg/dL    Comment: Performed at [facility], [address], [place], [place] [address]  Ammonia     Status: None   Collection Time: 10/17/24 10:55 AM  Result Value Ref Range   Ammonia 34 9 - 35 umol/L    Comment: Performed at [facility], [address] [place], [place] [address]  Glucose, capillary     Status: Abnormal   Collection Time: 10/17/24 12:12 PM  Result Value Ref Range   Glucose-Capillary 214 (H) 70 - 99 mg/dL    Comment: Glucose reference range applies only to samples taken after fasting for at least 8 hours.  Glucose, capillary     Status: Abnormal   Collection Time: 10/17/24  5:06 PM  Result Value Ref Range   Glucose-Capillary 161 (H) 70 - 99 mg/dL    Comment: Glucose reference range applies only to samples taken after fasting for at least 8 hours.  CBC with Differential/Platelet     Status: Abnormal   Collection Time: 10/18/24  5:36 AM  Result Value Ref Range   WBC 10.2 4.0 - 10.5 K/uL   RBC 2.61 (L)  3.87 - 5.11 MIL/uL   Hemoglobin 8.2 (L) 12.0 - 15.0 g/dL   HCT 76.2 (L) 63.9 - 53.9 %   MCV 90.8 80.0 - 100.0 fL   MCH 31.4 26.0 - 34.0 pg   MCHC 34.6 30.0 - 36.0 g/dL   RDW 85.4 88.4 - 84.4 %   Platelets 150 150 - 400 K/uL   nRBC 0.0 0.0 - 0.2 %   Neutrophils Relative % 57 %   Neutro Abs 5.9 1.7 - 7.7 K/uL   Lymphocytes Relative 21 %   Lymphs Abs 2.1 0.7 - 4.0 K/uL   Monocytes Relative 13 %   Monocytes Absolute 1.3 (H) 0.1 - 1.0 K/uL   Eosinophils Relative 4 %   Eosinophils Absolute 0.4 0.0 - 0.5 K/uL   Basophils Relative 1 %   Basophils Absolute 0.1 0.0 - 0.1 K/uL   Immature Granulocytes 4 %   Abs Immature Granulocytes 0.45 (H) 0.00 - 0.07 K/uL    Comment: Performed at [facility], [address] [place], [place] [address]  Comprehensive metabolic panel with GFR     Status: Abnormal   Collection Time: 10/18/24  5:36 AM  Result Value Ref Range   Sodium 127 (L) 135 - 145 mmol/L   Potassium 3.9 3.5 - 5.1 mmol/L   Chloride 98 98 - 111 mmol/L   CO2 20 (L) 22 - 32 mmol/L   Glucose, Bld 111 (H) 70 - 99 mg/dL    Comment: Glucose reference range applies only to samples taken after fasting for at least 8 hours.   BUN 55 (H) 6 - 20 mg/dL   Creatinine, Ser 7.43 (H) 0.44 - 1.00 mg/dL  Calcium  8.1 (L) 8.9 - 10.3 mg/dL   Total Protein 5.2 (L) 6.5 - 8.1 g/dL   Albumin 2.6 (L) 3.5 - 5.0 g/dL   AST 33 15 - 41 U/L   ALT 17 0 - 44 U/L   Alkaline Phosphatase 100 38 - 126 U/L   Total Bilirubin 1.1 0.0 - 1.2 mg/dL   GFR, Estimated 21 (L) >60 mL/min    Comment: (NOTE) Calculated using the CKD-EPI Creatinine Equation (2021)    Anion gap 8 5 - 15    Comment: Performed at [facility], [address] [place], [place] [address]  Magnesium      Status: None   Collection Time: 10/18/24  5:36 AM  Result Value Ref Range   Magnesium  2.3 1.7 - 2.4 mg/dL    Comment: Performed at [facility], [address] [place], [place] [address]  Glucose, capillary     Status: Abnormal    Collection Time: 10/18/24  8:33 AM  Result Value Ref Range   Glucose-Capillary 125 (H) 70 - 99 mg/dL    Comment: Glucose reference range applies only to samples taken after fasting for at least 8 hours.  Glucose, capillary     Status: Abnormal   Collection Time: 10/18/24 12:32 PM  Result Value Ref Range   Glucose-Capillary 189 (H) 70 - 99 mg/dL    Comment: Glucose reference range applies only to samples taken after fasting for at least 8 hours.  Glucose, capillary     Status: Abnormal   Collection Time: 10/18/24  5:41 PM  Result Value Ref Range   Glucose-Capillary 180 (H) 70 - 99 mg/dL    Comment: Glucose reference range applies only to samples taken after fasting for at least 8 hours.  CBC with Differential/Platelet     Status: Abnormal   Collection Time: 10/19/24  4:05 AM  Result Value Ref Range   WBC 9.4 4.0 - 10.5 K/uL   RBC 2.67 (L) 3.87 - 5.11 MIL/uL   Hemoglobin 8.3 (L) 12.0 - 15.0 g/dL   HCT 75.3 (L) 63.9 - 53.9 %   MCV 92.1 80.0 - 100.0 fL   MCH 31.1 26.0 - 34.0 pg   MCHC 33.7 30.0 - 36.0 g/dL   RDW 85.4 88.4 - 84.4 %   Platelets 150 150 - 400 K/uL   nRBC 0.0 0.0 - 0.2 %   Neutrophils Relative % 58 %   Neutro Abs 5.5 1.7 - 7.7 K/uL   Lymphocytes Relative 18 %   Lymphs Abs 1.7 0.7 - 4.0 K/uL   Monocytes Relative 15 %   Monocytes Absolute 1.4 (H) 0.1 - 1.0 K/uL   Eosinophils Relative 4 %   Eosinophils Absolute 0.3 0.0 - 0.5 K/uL   Basophils Relative 1 %   Basophils Absolute 0.1 0.0 - 0.1 K/uL   Immature Granulocytes 4 %   Abs Immature Granulocytes 0.39 (H) 0.00 - 0.07 K/uL    Comment: Performed at [facility], [address], [place], [place] [address]  Comprehensive metabolic panel with GFR     Status: Abnormal   Collection Time: 10/19/24  4:05 AM  Result Value Ref Range   Sodium 128 (L) 135 - 145 mmol/L   Potassium 3.9 3.5 - 5.1 mmol/L   Chloride 100 98 - 111 mmol/L   CO2 17 (L) 22 - 32 mmol/L   Glucose, Bld 108 (H) 70 - 99 mg/dL    Comment: Glucose  reference range applies only to samples  taken after fasting for at least 8 hours.   BUN 53 (H) 6 - 20 mg/dL   Creatinine, Ser 7.29 (H) 0.44 - 1.00 mg/dL   Calcium  8.0 (L) 8.9 - 10.3 mg/dL   Total Protein 5.2 (L) 6.5 - 8.1 g/dL   Albumin 2.5 (L) 3.5 - 5.0 g/dL   AST 37 15 - 41 U/L   ALT 16 0 - 44 U/L   Alkaline Phosphatase 115 38 - 126 U/L   Total Bilirubin 1.0 0.0 - 1.2 mg/dL   GFR, Estimated 20 (L) >60 mL/min    Comment: (NOTE) Calculated using the CKD-EPI Creatinine Equation (2021)    Anion gap 11 5 - 15    Comment: Performed at [facility], [address], [place], [place] [address]  Magnesium      Status: None   Collection Time: 10/19/24  4:05 AM  Result Value Ref Range   Magnesium  2.2 1.7 - 2.4 mg/dL    Comment: Performed at [facility], [address] [place], [place] [address]  Glucose, capillary     Status: Abnormal   Collection Time: 10/19/24  7:46 AM  Result Value Ref Range   Glucose-Capillary 122 (H) 70 - 99 mg/dL    Comment: Glucose reference range applies only to samples taken after fasting for at least 8 hours.   Comment 1 Notify RN    No results found.  PMH:   Past Medical History:  Diagnosis Date   Allergy    Anemia    Cirrhosis (HCC)    Constipation    Contact lens/glasses fitting    wears contacts or glasses   Diabetes mellitus without complication (HCC)    Edema of both lower extremities    Food allergy    Gastric varices    GERD (gastroesophageal reflux disease)    occ pepcid ac   Heart murmur    Hyperlipidemia    Hypertension    Joint pain    Periumbilical hernia    Primary biliary cholangitis (HCC)    AMA negative PBC   Snores    Vitamin D  deficiency     PSH:   Past Surgical History:  Procedure Laterality Date   BREAST BIOPSY Left 10/02/2023   MM LT BREAST BX W LOC DEV 1ST LESION IMAGE BX SPEC STEREO GUIDE 10/02/2023 [facility]   BREAST BIOPSY Left 10/02/2023   MM LT BREAST BX W LOC DEV EA AD LESION IMG BX SPEC STEREO  GUIDE 10/02/2023 [facility]   DILATION AND CURETTAGE OF UTERUS  11/1995, 10/1997   ESOPHAGOGASTRODUODENOSCOPY N/A 10/10/2024   Procedure: EGD (ESOPHAGOGASTRODUODENOSCOPY);  Surgeon: Albertus Gordy HERO, MD;  Location: [facility] END[facility];  Service: Gastroenterology;  Laterality: N/A;   IR EMBO VENOUS NOT HEMORR HEMANG  INC GUIDE ROADMAPPING  10/10/2024   IR INTRAVASCULAR ULTRASOUND NON CORONARY  10/10/2024   IR PARACENTESIS  10/15/2024   IR THROMBECT VENO MECH MOD SED  10/15/2024   IR TIPS  10/10/2024   IR TIPS REVISION MOD SED  10/15/2024   IR US  GUIDE VASC ACCESS RIGHT  10/10/2024   IR US  GUIDE VASC ACCESS RIGHT  10/10/2024   IR US  GUIDE VASC ACCESS RIGHT  10/15/2024   LEEP     LIVER BIOPSY     RADIOLOGY WITH ANESTHESIA N/A 10/10/2024   Procedure: RADIOLOGY WITH ANESTHESIA;  Surgeon: Radiologist, Medication, MD;  Location: [facility];  Service: Radiology;  Laterality: N/A;   TUBAL LIGATION  10/1997   UMBILICAL HERNIA REPAIR  N/A 08/11/2013   Procedure: HERNIA REPAIR UMBILICAL ADULT;  Surgeon: Lynwood MALVA Pina, MD;  Location: [facility];  Service: General;  Laterality: N/A;    Allergies: Allergies[1]  Medications:   Prior to Admission medications  Medication Sig Start Date End Date Taking? Authorizing Provider  carvedilol  (COREG ) 3.125 MG tablet Take 3.125 mg by mouth 2 (two) times daily.   Yes [provider]  hydrochlorothiazide  (HYDRODIURIL ) 25 MG tablet TAKE 1 TABLET BY MOUTH DAILY 01/07/24  Yes Dugal, Tabitha, FNP  lactulose  (CHRONULAC ) 10 GM/15ML solution Take 45 mLs (30 g total) by mouth 2 (two) times daily. 08/29/24  Yes Mdala-Gausi, Masiku Agatha, MD  spironolactone  (ALDACTONE ) 50 MG tablet Take 1 tablet (50 mg total) by mouth daily. 09/07/24  Yes Dugal, Tabitha, FNP  ursodiol  (ACTIGALL ) 300 MG capsule TAKE 2 CAPSULES BY MOUTH TWICE  DAILY 10/22/23  Yes Armbruster, Elspeth SQUIBB, MD    Discontinued Meds:   Medications Discontinued During This Encounter  Medication Reason    norepinephrine  (LEVOPHED ) 4mg  in (0.016 mg/mL) premix infusion    propofol  (DIPRIVAN ) 1000 MG/100ML infusion    fentaNYL  (SUBLIMAZE ) injection 50 mcg    fentaNYL  (SUBLIMAZE ) injection 50-200 mcg    senna (SENOKOT) tablet 8.6 mg    polyethylene glycol (MIRALAX  / GLYCOLAX ) packet 17 g    pantoprazole  (PROTONIX ) injection 40 mg    insulin  aspart (novoLOG ) injection 0-9 Units Change in therapy   octreotide  (SANDOSTATIN ) 500 mcg in sodium chloride  0.9 % 250 mL (2 mcg/mL) infusion Completed Course   norepinephrine  (LEVOPHED ) 4mg  in (0.016 mg/mL) premix infusion Completed Course   lactulose  (CHRONULAC ) 10 GM/15ML solution 20 g    lactulose  (CHRONULAC ) 10 GM/15ML solution 20 g    insulin  aspart (novoLOG ) injection 0-15 Units    0.9 %  sodium chloride  infusion    heparin  injection 5,000 Units    lactulose  (CHRONULAC ) 10 GM/15ML solution 10 g    carvedilol  (COREG ) tablet 3.125 mg    pantoprazole  (PROTONIX ) injection 40 mg     Social History:  reports that she has never smoked. She has never used smokeless tobacco. She reports current alcohol use. She reports that she does not use drugs.  Family History:   Family History  Problem Relation Age of Onset   Breast cancer Mother    Schizophrenia Mother    Depression Mother    Uterine cancer Mother    Hypertension Mother    Obesity Mother    Renal Disease Father    Diabetes Father    Other Father        Agent Orange   Lung cancer Maternal Grandmother    Lung cancer Maternal Grandfather    Heart disease Maternal Uncle    Diabetes Other        Aunts and Uncles on both sides   Heart disease Maternal Aunt    Colon cancer Neg Hx    Esophageal cancer Neg Hx    Stomach cancer Neg Hx    Rectal cancer Neg Hx     Blood pressure (!) 105/57, pulse 74, temperature 98.2 F (36.8 C), resp. rate 16, height 5' (1.524 m), weight 102.6 kg, SpO2 99%. General appearance: alert, cooperative, and appears stated age Head: Normocephalic, without  obvious abnormality, atraumatic Eyes: negative Neck: no adenopathy, no carotid bruit, supple, symmetrical, trachea midline, and thyroid  not enlarged, symmetric, no tenderness/mass/nodules Back: symmetric, no curvature. ROM normal. No CVA tenderness. Resp: clear to auscultation bilaterally Cardio: regular rate and rhythm  GI: soft, non-tender; bowel sounds normal; no masses,  no organomegaly Extremities: edema 1+ Pulses: 2+ and symmetric Skin: Skin color, texture, turgor normal. No rashes or lesions       Thaddaeus Granja, LYNWOOD ORN, MD 10/19/2024, 9:36 AM      [1]  Allergies Allergen Reactions   Mushroom Extract Complex (Obsolete) Anaphylaxis    Throat swells   Grapefruit Concentrate Other (See Comments)    blisters   Mounjaro  [Tirzepatide ] Other (See Comments)    Hepatic encephalopathy   Sulfa Antibiotics Rash    Severe dehydration   Amlodipine Other (See Comments)    Heart rate fast   Crestor  [Rosuvastatin ]     Brain fog, fatigue, felt 'terrible'.    Shellfish Allergy Hives        Simvastatin      Brain fog, fatigue, felt 'terrible'.    Strawberry Extract     hives   Metformin  And Related Diarrhea    Weakness

## 2024-10-19 NOTE — Telephone Encounter (Signed)
 Patient admitted on 10/09/2024 with acute GI bleed and gastroesophageal varices underwent emergent BRTO and TIPS on 10/10/2024.  She required intubation for 24 hours and is now extubated and is no longer in the ICU.  Her TIPS became occluded and she developed ascites.  She had TIPS thrombectomy on 10/15/2024.  She currently has AKI related to hemorrhagic shock versus HRS versus dye load for procedures.  Current MELD is 27.  Patient's blood group A.  Spoke with patient.  She reports that she was told that she may be discharged in the next 24 to 36 hours.  I am not sure how accurate this is as her creatinine is continuing to rise and was 2.7 today.  I recommended that if they do not discuss discharge with her tomorrow that she requested transfer to [facility] or at minimum asked the physicians to contact me directly.  I discussed with the patient that it is time to start considering liver transplant as she had her first decompensation in December with an admission for hepatic encephalopathy and is now had variceal hemorrhage, development of ascites, and renal injury.

## 2024-10-19 NOTE — Progress Notes (Addendum)
"     Gastroenterology Progress Note  CC:  Acute upper GI bleed in setting of PBC with cirrhosis   Subjective: She is sitting up in the chair.  She stated feeling better today, has a little bit more energy.  Appetite still remains decreased.  She passed 2 soft to loose brown stools this morning, no bloody or black stools.  She has infrequent brief left mid abdominal pain, no pain at this time.  No confusion overnight or this morning.  No chest pain or shortness of breath.  Husband at the bedside.   Objective:  Vital signs in last 24 hours: Temp:  [98.2 F (36.8 C)-98.3 F (36.8 C)] 98.3 F (36.8 C) (01/26 1007) Pulse Rate:  [67-74] 71 (01/26 1007) Resp:  [14-16] 16 (01/26 1007) BP: (101-109)/(36-59) 109/53 (01/26 1007) SpO2:  [99 %-100 %] 100 % (01/26 1007) Weight:  [102.6 kg] 102.6 kg (01/26 0500) Last BM Date : 10/18/24 General: Alert 59 year old female in no acute distress. Eyes: No scleral icterus. Heart: Regular rate and rhythm, II/VI systolic murmur.  Pulm: Breath sounds clear throughout.  On room air. Abdomen: Obese abdomen, soft.  Nondistended.  No ascites. + Hepatomegaly.  No palpable mass Extremities: Bilateral lower extremities with 1+ pitting edema. Neurologic:  Alert and oriented x 4.  Speech is clear.  Moves all extremities equally.  No asterixis. Psych:  Alert and cooperative. Normal mood and affect.  Intake/Output from previous day: 01/25 0701 - 01/26 0700 In: 20.5 [I.V.:20.5] Out: -  Intake/Output this shift: Total I/O In: 120 [P.O.:120] Out: -   Lab Results: Recent Labs    10/17/24 1055 10/18/24 0536 10/19/24 0405  WBC 11.4* 10.2 9.4  HGB 8.5* 8.2* 8.3*  HCT 25.6* 23.7* 24.6*  PLT 165 150 150   BMET Recent Labs    10/17/24 1055 10/18/24 0536 10/19/24 0405  NA 128* 127* 128*  K 3.8 3.9 3.9  CL 100 98 100  CO2 19* 20* 17*  GLUCOSE 205* 111* 108*  BUN 52* 55* 53*  CREATININE 2.24* 2.56* 2.70*  CALCIUM  8.2* 8.1* 8.0*   LFT Recent  Labs    10/19/24 0405  PROT 5.2*  ALBUMIN 2.5*  AST 37  ALT 16  ALKPHOS 115  BILITOT 1.0   PT/INR No results for input(s): LABPROT, INR in the last 72 hours. Hepatitis Panel No results for input(s): HEPBSAG, HCVAB, HEPAIGM, HEPBIGM in the last 72 hours.  MELD 3.0: 25 at 10/17/2024 10:55 AM MELD-Na: 22 at 10/17/2024 10:55 AM Calculated from: Serum Creatinine: 2.24 mg/dL at 8/75/7973 89:44 AM Serum Sodium: 128 mmol/L at 10/17/2024 10:55 AM Total Bilirubin: 1.1 mg/dL at 8/75/7973 89:44 AM Serum Albumin: 2.8 g/dL at 8/75/7973 89:44 AM INR(ratio): 1.1 at 10/15/2024  5:29 AM Age at listing (hypothetical): 59 years Sex: Female at 10/17/2024 10:55 AM    No results found.  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 Admitted with 10/09/2024 hematemesis. Admission MELD 3.0: 12.   Had several episodes of hematemesis of primarily bright red blood Hemoglobin on arrival 10.7> 9.2, transfused 1 unit.   CTAP showed upper abdominal varices, no ascites. Received IV PPI, Octreotide  and Ceftriaxone  prophylaxis.  EGD 10/10/2024 showed  no esophageal varices, clotted blood in the cardia, gastric fundus and in the gastric body, large amount of type I isolated gastric varix in the cardia and fundus with no active bleeding but did  have stigmata of recent bleeding and hematoma, medium in size.  Had also been noted to have new finding of a spontaneous splenorenal shunt on CT this admission   Patient is status post TIPS, coil embolization of posterior left gastric veins and coil assisted retrograde transvenous obliteration of gastrorenal shunt 10/10/2024.  Acute abdominal pain, repeat CTA 1/21 showed TIPS occlusion. S/P TIPS thrombectomy and TIPS revision 10/15/2024.  S/P paracentesis, 600 mL of peritoneal fluid removed on 1/20  10/2024  Hepatic encephalopathy controlled with lactulose  and rifaximin   - Continue lactulose  10 g p.o. to 3 times daily, titrate to 3-4 bowel movements daily - Continue rifaximin  550 mg p.o. twice daily - Continue zinc  220 mg daily - Continue Coreg  3.125 mg p.o. twice daily - Continue pantoprazole  40 mg po daily - Continue 2gm sodium diet - Await further recommendations per Dr. Wilhelmenia - Avoid hepatotoxins - CBC, CMP in a.m. - Transfuse for hemoglobin level less than 7 - Follow-up with [facility]/ [name], NP after hospitalization  Acute on chronic anemia.  Received 1 unit of PRBCs on 1/17 and one unit of PRBCs on 1/21.   - Transfuse for Hg level < 7  AKI, at risk for HRS. Cr 2.56 -> 2.70. AKI likely due to contrast induced nephropathy and possible ischemic ATN per nephrology.   Hyponatremia. NA+ 128.   DM type II  Principal Problem:   Upper GI bleed Active Problems:   Gastric varix   Encephalopathy, hepatic (HCC)   Hepatic cirrhosis due to primary biliary cholangitis (HCC)   Hematemesis with nausea   Portal hypertension (HCC)   S/P TIPS (transjugular intrahepatic portosystemic shunt)     LOS: 9 days   Susan [name]  10/19/2024, 11:42 AM     Attending Physician's Attestation   I have taken an interval history, reviewed the chart and examined the patient.   Patient's clinical history reviewed and signed out as well.  Review of pretty complex clinical course requiring TIPS after persisting bleeding noted with BRTO as well with subsequent TIPS revision.  Thankfully at this point patient seems to be hemodynamically stable.  No evidence of overt portosystemic encephalopathy.  It would be important for our care manager to see what pharmacy benefits are going to be present to see if she may be able to continue Xifaxan  or not because if not then lactulose  therapy will need to be considered significantly.  Continue with zinc  therapy.  She is describing  some abdominal discomfort in the bilateral lower portion of the abdomen.  If this persist into tomorrow, then consider abdominal ultrasound imaging with Doppler +/- cross-sectional CT abdomen/pelvis without contrast imaging to just ensure nothing else is occurring.  She will need follow-up with [facility] in the longer-term.  Thankfully no evidence of further liver decompensation at this time.  Time will tell.  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  I agree with the Advanced Practitioner's note, impression, and recommendations with updates and my documentation as noted above.  The majority of the medical decision making/process, formulation of the impression/plan of action for the patient were performed by me with substantive portion of this encounter (>50% time spent including complete performance of at least one of the key components of MDM, History, and/or Exam).   Aloha Wilhelmenia, MD [facility] Office #  [phone] ° ° ° °"

## 2024-10-19 NOTE — Plan of Care (Signed)
" °  Problem: Education: Goal: Ability to describe self-care measures that may prevent or decrease complications (Diabetes Survival Skills Education) will improve Outcome: Progressing   Problem: Coping: Goal: Ability to adjust to condition or change in health will improve Outcome: Progressing   Problem: Metabolic: Goal: Ability to maintain appropriate glucose levels will improve Outcome: Progressing   Problem: Skin Integrity: Goal: Risk for impaired skin integrity will decrease Outcome: Progressing   Problem: Activity: Goal: Risk for activity intolerance will decrease Outcome: Progressing   Problem: Elimination: Goal: Will not experience complications related to bowel motility Outcome: Progressing   Problem: Pain Managment: Goal: General experience of comfort will improve and/or be controlled Outcome: Progressing   Problem: Safety: Goal: Ability to remain free from injury will improve Outcome: Progressing   "

## 2024-10-19 NOTE — Progress Notes (Signed)
 Physical Therapy Treatment Patient Details Name: Susan [name] MRN: [id number] DOB: January 12, 1966 Today's Date: 10/19/2024   History of Present Illness Pt is a 59 y.o. female who presented 10/09/24 due to multiple episodes of bloody emesis. CT showed a nodular liver compatible with cirrhosis. Pt was admitted to ICU 1/17-19.  Pt s/p TIPS and coil embolization of posterior and L gastric veins 10/10/24. 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 2. Small volume pelvic and perihepatic ascites, new since previous. 3. Streak artifact from multiple embolization coils limits evaluation of left upper quadrant varices; some visible varices near the GE junction remain  patent. Pending blood transfusion 1/21 at time of PT session. PMH: autoimmune liver cirrhosis, hepatic encephalopathy, esophageal varices, primary biliary cholangitis, HTN, HLD, obesity.    PT Comments  Patient asleep in chair on arrival. Briefly aroused and agreed to PT. While donning compression hose, pt continuously falling asleep. Once feet down on the floor, did a few LE exercises, and pt reporting she is awake enough to try walking. Previously pt had walked 5 ft with PT and today managed 120 Ft with RW with very slow cadence and CGA. She is concerned about being able to ascend flight of stairs to her bedroom and bathroom. If pt alert, will practice step(s) next session.   Addendum: found pt's written HEP in the room and discussed working on the exercises between therapy sessions. She admitted she has not been but will try to do them.     If plan is discharge home, recommend the following: A little help with walking and/or transfers;A little help with bathing/dressing/bathroom;Assistance with cooking/housework;Assist for transportation;Help with stairs or ramp for entrance;Supervision due to cognitive  status   Can travel by private vehicle        Equipment Recommendations  Rolling walker (2 wheels);BSC/3in1;Other (comment) (pending progress; may need youth height bari RW for hip width)    Recommendations for Other Services       Precautions / Restrictions Precautions Precautions: Fall Recall of Precautions/Restrictions: Intact Precaution/Restrictions Comments: watch BP, symptomatic drop in MAP 1/21 (compression socks in room 1/26) Restrictions Weight Bearing Restrictions Per Provider Order: Yes     Mobility  Bed Mobility Overal bed mobility: Needs Assistance             General bed mobility comments: up in chair    Transfers Overall transfer level: Needs assistance Equipment used: Rolling walker (2 wheels) Transfers: Sit to/from Stand Sit to Stand: Contact guard assist           General transfer comment: from recliner x 2    Ambulation/Gait Ambulation/Gait assistance: Contact guard assist Gait Distance (Feet): 120 Feet Assistive device: Rolling walker (2 wheels) Gait Pattern/deviations: Step-through pattern, Decreased stride length Gait velocity: decreased Gait velocity interpretation: <1.8 ft/sec, indicate of risk for recurrent falls   General Gait Details: Denied dizziness throughout   Stairs             Wheelchair Mobility     Tilt Bed    Modified Rankin (Stroke Patients Only)       Balance Overall balance assessment: Needs assistance Sitting-balance support: No upper extremity supported, Feet supported Sitting balance-Leahy Scale: Fair     Standing balance support: Bilateral upper extremity supported, During functional activity Standing balance-Leahy Scale: Fair  Communication Communication Communication: No apparent difficulties Factors Affecting Communication: Reduced clarity of speech (very soft spoken)  Cognition Arousal: Lethargic Behavior During Therapy: WFL for tasks  assessed/performed                           PT - Cognition Comments: maintains eyes open. Slower processing; <4 hrs sleep last night per husband Following commands: Impaired      Cueing Cueing Techniques: Verbal cues, Tactile cues, Gestural cues  Exercises General Exercises - Lower Extremity Long Arc Quad: AROM, Both, 5 reps, Seated Heel Raises: AROM, Both, 10 reps, Seated    General Comments General comments (skin integrity, edema, etc.): thigh high compression hose arrived; donned and could not get them over her thighs, therefore had to roll them down at the knee (assuring no wrinkles); educated pt to wear during the day and off at night      Pertinent Vitals/Pain      Home Living                          Prior Function            PT Goals (current goals can now be found in the care plan section) Acute Rehab PT Goals Patient Stated Goal: To feel less dizzy so I can walk more and to be able to go back to work. Time For Goal Achievement: 10/27/24 Potential to Achieve Goals: Good Progress towards PT goals: Progressing toward goals    Frequency    Min 2X/week      PT Plan      Co-evaluation              AM-PAC PT 6 Clicks Mobility   Outcome Measure  Help needed turning from your back to your side while in a flat bed without using bedrails?: None Help needed moving from lying on your back to sitting on the side of a flat bed without using bedrails?: A Little Help needed moving to and from a bed to a chair (including a wheelchair)?: A Little Help needed standing up from a chair using your arms (e.g., wheelchair or bedside chair)?: A Little Help needed to walk in hospital room?: A Little Help needed climbing 3-5 steps with a railing? : Total 6 Click Score: 17    End of Session Equipment Utilized During Treatment: Gait belt Activity Tolerance: Patient limited by fatigue Patient left: with call bell/phone within reach;with  family/visitor present;in chair Nurse Communication: Mobility status;Other (comment) (remove TED hose when goes to bed) PT Visit Diagnosis: Difficulty in walking, not elsewhere classified (R26.2);Unsteadiness on feet (R26.81)     Time: 8477-8441 PT Time Calculation (min) (ACUTE ONLY): 36 min  Charges:    $Gait Training: 23-37 mins PT General Charges $$ ACUTE PT VISIT: 1 Visit                      Macario RAMAN, PT Acute Rehabilitation Services  Office [phone]    Macario SHAUNNA Soja 10/19/2024, 4:13 PM

## 2024-10-19 NOTE — Progress Notes (Signed)
 " PROGRESS NOTE    Susan [name]  FMW:[id number] DOB: [birth year] DOA: 10/09/2024 PCP: Corwin Antu, FNP   Brief Narrative:  59 year old female with history of alcoholic cirrhosis, esophageal varices, primary biliary cholangitis, hepatic encephalopathy, hypertension, hyperlipidemia, obesity and hospitalization in December 2025 for hepatic encephalopathy presented with hematemesis.  She was initially admitted to ICU and intubated for airway protection she was transfused packed red cells.  She underwent EGD on 10/10/2024 followed by TIPS.  She was extubated on 10/11/2024.  Care has been transferred to TRH service from 10/13/2024.  She has had 2 unit packed red cells transfusion so far during the hospitalization.  She underwent TIPS thrombectomy and revision and therapeutic paracentesis with removal of 600 cc fluid by IR on 10/15/2024.  Assessment & Plan:   Hemorrhagic shock Upper GI bleeding from variceal bleeding Acute blood loss anemia - Has received 2 unit packed red cells during this hospitalization: Latest on 10/14/2024: Transfused 1 unit packed red cells for hemoglobin of 6.6.  Hemoglobin 8.3 this morning.  Monitor H&H -underwent EGD on 10/10/2024: Large amount of clotted blood found in the stomach along with type I gastric varices -s/p TIPS on 10/10/2024: Coil embolization of posterior and left gastric veins, and coil assisted retrograde transvenous obliteration of gastrorenal shunt.  IR following.  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  Patient underwent TIPS thrombectomy and revision and therapeutic paracentesis with removal of 600 cc fluid by IR on 10/15/2024 -GI following: Currently on IV Protonix .  Off octreotide  drip.  Completed 5-day course of Rocephin . - Diet advancement as per GI  Alcoholic cirrhosis Hepatic encephalopathy - GI following.  Continue rifaximin  and lactulose  monitor mental status  AKI - Possibly prerenal.   Creatinine has worsened to 2.70 today.  Patient was started on IV fluids on 10/18/2024.  Will consult nephrology.  Monitor creatinine.  Hypokalemia - Improved  Hypocalcemia - Improved  Hyponatremia - Sodium 128 today.  Currently on IV fluids.  Monitor.  Follow nephrology recommendations  Leukocytosis - Resolved  Thrombocytopenia - Resolved  Acute metabolic acidosis -  Monitor  Acute hypoxic respiratory failure -Status post intubation on admission and extubated on 10/11/2024.  Currently on room air  Diabetes mellitus type 2 -Continue CBGs with SSI  Obesity class III - Outpatient follow-up  Physical deconditioning - Will need home health PT/OT    DVT prophylaxis: SCDs.   Code Status: Full Family Communication: Husband at bedside Disposition Plan: Status is: Inpatient Remains inpatient appropriate because: Of severity of illness    Consultants: GI/IR  Procedures: As above  Antimicrobials:  Anti-infectives (From admission, onward)    Start     Dose/Rate Route Frequency Ordered Stop   10/14/24 0915  cefTRIAXone  (ROCEPHIN ) 1 g in sodium chloride  0.9 % 100 mL IVPB        1 g 200 mL/hr over 30 Minutes Intravenous Every 24 hours 10/14/24 0829 10/14/24 0959   10/11/24 1345  rifaximin  (XIFAXAN ) tablet 550 mg        550 mg Oral 2 times daily 10/11/24 1256     10/10/24 0830  cefTRIAXone  (ROCEPHIN ) 1 g in sodium chloride  0.9 % 100 mL IVPB        1 g 200 mL/hr over 30 Minutes Intravenous Every 24 hours 10/10/24 0730 10/13/24 9073        Subjective: Patient seen and examined at bedside.  Denies worsening shortness of breath, fever or vomiting.  Continues to have intermittent abdominal pain.   Objective: Vitals:   10/18/24 1712 10/18/24 2105 10/19/24 0500 10/19/24 0545  BP: (!) 101/59 (!) 101/36  (!) 105/57  Pulse: 67 73  74  Resp: 14 16  16   Temp: 98.2 F (36.8 C) 98.2 F (36.8 C)  98.2 F (36.8 C)  TempSrc: Oral Oral    SpO2: 100% 100%  99%  Weight:    102.6 kg   Height:        Intake/Output Summary (Last 24 hours) at 10/19/2024 0814 Last data filed at 10/19/2024 0520 Gross per 24 hour  Intake 20.5 ml  Output --  Net 20.5 ml   Filed Weights   10/16/24 0500 10/17/24 0655 10/19/24 0500  Weight: 97.1 kg 95.8 kg 102.6 kg    Examination:  General: On room air and in no distress currently.  Looks chronically ill and deconditioned ENT/neck: No JVD elevation or palpable thyromegaly respiratory: Bilateral decreased breath sounds at bases with some crackles  CVS: S1 S2 heard; rate currently controlled  abdominal: Soft, morbidly obese, mildly tender and distended; no organomegaly, normal bowel sounds are heard Extremities: Mild lower extremity edema; no cyanosis  CNS: Alert; able to answer questions appropriately but remains slow to respond.  No obvious focal deficits noted lymph: No lymphadenopathy palpable  skin: No obvious ecchymosis/lesions  psych: Not agitated; flat affect mostly  musculoskeletal: No obvious joint swelling/deformity   data Reviewed: I have personally reviewed following labs and imaging studies  CBC: Recent Labs  Lab 10/15/24 0529 10/16/24 0551 10/17/24 1055 10/18/24 0536 10/19/24 0405  WBC 10.8* 13.9* 11.4* 10.2 9.4  NEUTROABS 6.3 9.0* 8.0* 5.9 5.5  HGB 9.1* 8.9* 8.5* 8.2* 8.3*  HCT 26.4* 26.1* 25.6* 23.7* 24.6*  MCV 92.0 94.2 93.4 90.8 92.1  PLT 142* 169 165 150 150   Basic Metabolic Panel: Recent Labs  Lab 10/15/24 0529 10/16/24 0551 10/17/24 1055 10/18/24 0536 10/19/24 0405  NA 133* 131* 128* 127* 128*  K 4.0 4.3 3.8 3.9 3.9  CL 104 102 100 98 100  CO2 20* 21* 19* 20* 17*  GLUCOSE 112* 123* 205* 111* 108*  BUN 50* 47* 52* 55* 53*  CREATININE 1.28* 1.53* 2.24* 2.56* 2.70*  CALCIUM  8.2* 8.3* 8.2* 8.1* 8.0*  MG 2.3 2.3 2.2 2.3 2.2   GFR: Estimated Creatinine Clearance: 24.5 mL/min (A) (by C-G formula based on SCr of 2.7 mg/dL (H)). Liver Function Tests: Recent Labs  Lab 10/15/24 0529  10/16/24 0551 10/17/24 1055 10/18/24 0536 10/19/24 0405  AST 39 40 36 33 37  ALT 24 20 17 17 16   ALKPHOS 96 97 100 100 115  BILITOT 1.2 1.2 1.1 1.1 1.0  PROT 5.7* 5.7* 5.5* 5.2* 5.2*  ALBUMIN 2.8* 2.7* 2.8* 2.6* 2.5*   No results for input(s): LIPASE, AMYLASE in the last 168 hours.  Recent Labs  Lab 10/15/24 0529 10/17/24 1055  AMMONIA 48* 34   Coagulation Profile: Recent Labs  Lab 10/15/24 0529  INR 1.1   Cardiac Enzymes: No results for input(s): CKTOTAL, CKMB, CKMBINDEX, TROPONINI in the last 168 hours. BNP (last 3 results) No results for input(s): PROBNP in the last 8760 hours. HbA1C: No results for input(s): HGBA1C in the last 72 hours. CBG: Recent Labs  Lab 10/17/24 1706 10/18/24 0833 10/18/24 1232 10/18/24 1741 10/19/24 0746  GLUCAP 161* 125* 189* 180* 122*   Lipid Profile: No results for input(s): CHOL, HDL, LDLCALC, TRIG, CHOLHDL, LDLDIRECT in the last 72 hours.  Thyroid  Function  Tests: No results for input(s): TSH, T4TOTAL, FREET4, T3FREE, THYROIDAB in the last 72 hours. Anemia Panel: No results for input(s): VITAMINB12, FOLATE, FERRITIN, TIBC, IRON, RETICCTPCT in the last 72 hours. Sepsis Labs: No results for input(s): PROCALCITON, LATICACIDVEN in the last 168 hours.  Recent Results (from the past 240 hours)  MRSA Next Gen by PCR, Nasal     Status: None   Collection Time: 10/10/24  4:17 AM   Specimen: Nasal Mucosa; Nasal Swab  Result Value Ref Range Status   MRSA by PCR Next Gen NOT DETECTED NOT DETECTED Final    Comment: (NOTE) The GeneXpert MRSA Assay (FDA approved for NASAL specimens only), is one component of a comprehensive MRSA colonization surveillance program. It is not intended to diagnose MRSA infection nor to guide or monitor treatment for MRSA infections. Test performance is not FDA approved in patients less than 88 years old. Performed at [facility], [address], [place], [place] [address]          Radiology Studies: No results found.       Scheduled Meds:  sodium chloride    Intravenous Once   Chlorhexidine  Gluconate Cloth  6 each Topical Daily   insulin  aspart  0-15 Units Subcutaneous TID with meals   lactulose   10 g Oral TID   pantoprazole   40 mg Oral Daily   rifaximin   550 mg Oral BID   zinc  sulfate (50mg  elemental zinc )  220 mg Oral BID   Continuous Infusions:  sodium chloride  75 mL/hr at 10/18/24 1007           Hannie Shoe, MD [facility] 10/19/2024, 8:14 AM   "

## 2024-10-20 DIAGNOSIS — K922 Gastrointestinal hemorrhage, unspecified: Secondary | ICD-10-CM | POA: Diagnosis not present

## 2024-10-20 DIAGNOSIS — D649 Anemia, unspecified: Secondary | ICD-10-CM | POA: Diagnosis not present

## 2024-10-20 DIAGNOSIS — K7682 Hepatic encephalopathy: Secondary | ICD-10-CM | POA: Diagnosis not present

## 2024-10-20 DIAGNOSIS — K743 Primary biliary cirrhosis: Secondary | ICD-10-CM | POA: Diagnosis not present

## 2024-10-20 DIAGNOSIS — I864 Gastric varices: Secondary | ICD-10-CM | POA: Diagnosis not present

## 2024-10-20 DIAGNOSIS — K766 Portal hypertension: Secondary | ICD-10-CM | POA: Diagnosis not present

## 2024-10-20 DIAGNOSIS — E871 Hypo-osmolality and hyponatremia: Secondary | ICD-10-CM | POA: Diagnosis not present

## 2024-10-20 DIAGNOSIS — N179 Acute kidney failure, unspecified: Secondary | ICD-10-CM | POA: Diagnosis not present

## 2024-10-20 LAB — CBC WITH DIFFERENTIAL/PLATELET
Abs Immature Granulocytes: 0.27 10*3/uL — ABNORMAL HIGH (ref 0.00–0.07)
Basophils Absolute: 0.1 10*3/uL (ref 0.0–0.1)
Basophils Relative: 1 %
Eosinophils Absolute: 0.3 10*3/uL (ref 0.0–0.5)
Eosinophils Relative: 3 %
HCT: 23.4 % — ABNORMAL LOW (ref 36.0–46.0)
Hemoglobin: 8.2 g/dL — ABNORMAL LOW (ref 12.0–15.0)
Immature Granulocytes: 3 %
Lymphocytes Relative: 18 %
Lymphs Abs: 1.8 10*3/uL (ref 0.7–4.0)
MCH: 31.7 pg (ref 26.0–34.0)
MCHC: 35 g/dL (ref 30.0–36.0)
MCV: 90.3 fL (ref 80.0–100.0)
Monocytes Absolute: 1.5 10*3/uL — ABNORMAL HIGH (ref 0.1–1.0)
Monocytes Relative: 14 %
Neutro Abs: 6.4 10*3/uL (ref 1.7–7.7)
Neutrophils Relative %: 61 %
Platelets: 178 10*3/uL (ref 150–400)
RBC: 2.59 MIL/uL — ABNORMAL LOW (ref 3.87–5.11)
RDW: 14.4 % (ref 11.5–15.5)
WBC: 10.3 10*3/uL (ref 4.0–10.5)
nRBC: 0 % (ref 0.0–0.2)

## 2024-10-20 LAB — COMPREHENSIVE METABOLIC PANEL WITH GFR
ALT: 18 U/L (ref 0–44)
AST: 42 U/L — ABNORMAL HIGH (ref 15–41)
Albumin: 2.5 g/dL — ABNORMAL LOW (ref 3.5–5.0)
Alkaline Phosphatase: 134 U/L — ABNORMAL HIGH (ref 38–126)
Anion gap: 12 (ref 5–15)
BUN: 51 mg/dL — ABNORMAL HIGH (ref 6–20)
CO2: 16 mmol/L — ABNORMAL LOW (ref 22–32)
Calcium: 8 mg/dL — ABNORMAL LOW (ref 8.9–10.3)
Chloride: 99 mmol/L (ref 98–111)
Creatinine, Ser: 2.58 mg/dL — ABNORMAL HIGH (ref 0.44–1.00)
GFR, Estimated: 21 mL/min — ABNORMAL LOW
Glucose, Bld: 112 mg/dL — ABNORMAL HIGH (ref 70–99)
Potassium: 3.7 mmol/L (ref 3.5–5.1)
Sodium: 127 mmol/L — ABNORMAL LOW (ref 135–145)
Total Bilirubin: 1.1 mg/dL (ref 0.0–1.2)
Total Protein: 5.4 g/dL — ABNORMAL LOW (ref 6.5–8.1)

## 2024-10-20 LAB — MAGNESIUM: Magnesium: 2.2 mg/dL (ref 1.7–2.4)

## 2024-10-20 LAB — GLUCOSE, CAPILLARY
Glucose-Capillary: 108 mg/dL — ABNORMAL HIGH (ref 70–99)
Glucose-Capillary: 127 mg/dL — ABNORMAL HIGH (ref 70–99)
Glucose-Capillary: 149 mg/dL — ABNORMAL HIGH (ref 70–99)
Glucose-Capillary: 153 mg/dL — ABNORMAL HIGH (ref 70–99)
Glucose-Capillary: 110 mg/dL — ABNORMAL HIGH (ref 70–99)

## 2024-10-20 MED ORDER — MIDODRINE HCL 5 MG PO TABS
10.0000 mg | ORAL_TABLET | Freq: Three times a day (TID) | ORAL | Status: DC
  Administered 2024-10-20 – 2024-10-22 (×6): 10 mg via ORAL
  Filled 2024-10-20 (×6): qty 2

## 2024-10-20 MED ORDER — ACETAMINOPHEN 325 MG PO TABS: 650.0000 mg | ORAL_TABLET | Freq: Four times a day (QID) | ORAL | Status: DC | PRN

## 2024-10-20 MED ORDER — OXYCODONE HCL 5 MG PO TABS
5.0000 mg | ORAL_TABLET | Freq: Four times a day (QID) | ORAL | Status: DC | PRN
  Administered 2024-10-22: 5 mg via ORAL
  Filled 2024-10-20: qty 1

## 2024-10-20 NOTE — Progress Notes (Signed)
 "   Referring Physician(s): Dr. Gordy Starch, MD  Supervising Physician: Philip Cornet  Patient Status:  [facility] - In-pt  Chief Complaint: PBC/AIH cirrhosis with UGIB due to gastric varices S/p TIPS, coil embolization of posterior and left gastric veins, and BRTO by Dr. Jennefer on 10/10/24. S/p TIPS revision/thrombectomy 1/22  Subjective: Patient alert and sitting up in chair, calm.  Currently without any significant complaints. She does continue to experience worsened abdominal discomfort  Patient denies any fevers, headache, chest pain, SOB, cough, abdominal pain, nausea, vomiting or bleeding.     Allergies: Mushroom extract complex (obsolete), Grapefruit concentrate, Mounjaro  [tirzepatide ], Sulfa antibiotics, Amlodipine, Crestor  [rosuvastatin ], Shellfish allergy, Simvastatin , Strawberry extract, and Metformin  and related  Medications: Prior to Admission medications  Medication Sig Start Date End Date Taking? Authorizing Provider  carvedilol  (COREG ) 3.125 MG tablet Take 3.125 mg by mouth 2 (two) times daily.   Yes [provider]  hydrochlorothiazide  (HYDRODIURIL ) 25 MG tablet TAKE 1 TABLET BY MOUTH DAILY 01/07/24  Yes Dugal, Tabitha, FNP  lactulose  (CHRONULAC ) 10 GM/15ML solution Take 45 mLs (30 g total) by mouth 2 (two) times daily. 08/29/24  Yes Mdala-Gausi, Masiku Agatha, MD  spironolactone  (ALDACTONE ) 50 MG tablet Take 1 tablet (50 mg total) by mouth daily. 09/07/24  Yes Dugal, Ginger, FNP  ursodiol  (ACTIGALL ) 300 MG capsule TAKE 2 CAPSULES BY MOUTH TWICE  DAILY 10/22/23  Yes Armbruster, Elspeth SQUIBB, MD     Vital Signs: BP (!) 110/58 (BP Location: Left Arm)   Pulse 81   Temp 98.1 F (36.7 C) (Oral)   Resp 16   Ht 5' (1.524 m)   Wt 220 lb 7.4 oz (100 kg)   SpO2 100%   BMI 43.06 kg/m   Physical Exam Constitutional:      Appearance: Normal appearance.  Neck:     Comments: Right internal jugular site without evidence of pseudoaneurysm, bleeding, bruising. Dressing  removed. Cardiovascular:     Rate and Rhythm: Normal rate and regular rhythm.  Pulmonary:     Effort: Pulmonary effort is normal.     Breath sounds: Normal breath sounds.  Abdominal:     General: Abdomen is flat.     Palpations: Abdomen is soft.     Comments: No abdominal tenderness to palpation in all 4 quadrants.  Musculoskeletal:        General: Normal range of motion.  Skin:    General: Skin is warm and dry.  Neurological:     Mental Status: She is alert and oriented to person, place, and time.    NAD, alert, communicative Abdomen: soft, non-tender to palpation Skin: internal jugular site covered with large dressing that is clean and dry.  Neuro: oriented, conversant. Asking appropriate questions.   Imaging: No results found.   Labs:  CBC: Recent Labs    10/17/24 1055 10/18/24 0536 10/19/24 0405 10/20/24 0550  WBC 11.4* 10.2 9.4 10.3  HGB 8.5* 8.2* 8.3* 8.2*  HCT 25.6* 23.7* 24.6* 23.4*  PLT 165 150 150 178    COAGS: Recent Labs    08/29/24 0553 09/03/24 1419 10/10/24 0040 10/11/24 0905 10/15/24 0529  INR 1.3* 1.4* 1.1 1.3* 1.1  APTT 35  --  24  --   --     BMP: Recent Labs    10/17/24 1055 10/18/24 0536 10/19/24 0405 10/20/24 0550  NA 128* 127* 128* 127*  K 3.8 3.9 3.9 3.7  CL 100 98 100 99  CO2 19* 20* 17* 16*  GLUCOSE 205* 111* 108*  112*  BUN 52* 55* 53* 51*  CALCIUM  8.2* 8.1* 8.0* 8.0*  CREATININE 2.24* 2.56* 2.70* 2.58*  GFRNONAA 25* 21* 20* 21*    LIVER FUNCTION TESTS: Recent Labs    10/17/24 1055 10/18/24 0536 10/19/24 0405 10/20/24 0550  BILITOT 1.1 1.1 1.0 1.1  AST 36 33 37 42*  ALT 17 17 16 18   ALKPHOS 100 100 115 134*  PROT 5.5* 5.2* 5.2* 5.4*  ALBUMIN 2.8* 2.6* 2.5* 2.5*    Assessment and Plan: 59 y.o. female with PBC/AIH cirrhosis, UGIB secondary to gastric varices, TIPS, coil embolization of posterior and left gastric veins, and BRTO by Dr. Jennefer on 10/10/24.  S/p TIPS thormbectomy 1/22 for near total  occlusion.  Internal jugular site appears to be healing well. Dressing is removed. No concerns.  Patient remains alert and per baseline on rounds, and per RN report. Patient has received all her doses of Lactulose  since Monday am, per RN report. Patient and RN both report 7-8 loose stools yesterday. Patient and RN report 3 lose stools today. Patient is continued on rifaximin  BID.  Nephrology on board following for renal recovery.  No repeat Ammonia level since 1/24.  Continue current management.   IR will follow-up periodically to monitor status.   Electronically Signed: Carlin DELENA Griffon, PA-C 10/20/2024, 3:51 PM   I spent a total of 15 Minutes at the the patient's bedside AND on the patient's hospital floor or unit, greater than 50% of which was counseling/coordinating care for cirrhosis with varices, GI bleed.       "

## 2024-10-20 NOTE — Progress Notes (Incomplete Revision)
 "    [facility] Progress Note  CC:   Acute upper GI bleed in setting of PBC with cirrhosis   Subjective: She is sitting up in the recliner.  She stated feeling quite well today.  No nausea or vomiting.  She endorsed having a band of pain across her upper abdomen for the past few nights which occurs at 10pm and abates after pain med administered. She passed 2 small loose brown stools earlier this morning, no bloody or black stools.  No chest pain or shortness of breath.  No confusion.  Has been at the bedside.   Objective:  Vital signs in last 24 hours: Temp:  [97.8 F (36.6 C)-98.9 F (37.2 C)] 98.1 F (36.7 C) (01/27 0919) Pulse Rate:  [75-81] 81 (01/27 0919) Resp:  [16] 16 (01/27 0919) BP: (101-112)/(56-65) 110/58 (01/27 0919) SpO2:  [99 %-100 %] 100 % (01/27 0919) Weight:  [100 kg] 100 kg (01/27 0500) Last BM Date : 10/19/24 General: Alert 59 year old female in no acute distress. Heart: Regular rhythm, II/VI systolic murmur. Pulm: Breath sounds clear throughout.  On room air. Abdomen:  Obese abdomen, soft.  Nondistended.  No ascites. + Hepatomegaly.  No palpable mass. Extremities: Mild bilateral lower extremity edema. Neurologic:  Alert and oriented x 4.  Speech is clear.  Moves all extremities equally.  No asterixis  Psych:  Alert and cooperative. Normal mood and affect.  Intake/Output from previous day: 01/26 0701 - 01/27 0700 In: 220 [P.O.:220] Out: -  Intake/Output this shift: No intake/output data recorded.  Lab Results: Recent Labs    10/18/24 0536 10/19/24 0405 10/20/24 0550  WBC 10.2 9.4 10.3  HGB 8.2* 8.3* 8.2*  HCT 23.7* 24.6* 23.4*  PLT 150 150 178   BMET Recent Labs    10/18/24 0536 10/19/24 0405 10/20/24 0550  NA 127* 128* 127*  K 3.9 3.9 3.7  CL 98 100 99  CO2 20* 17* 16*  GLUCOSE 111* 108* 112*  BUN 55* 53* 51*  CREATININE 2.56* 2.70* 2.58*  CALCIUM  8.1* 8.0* 8.0*   LFT Recent Labs    10/20/24 0550  PROT 5.4*  ALBUMIN  2.5*  AST 42*  ALT 18  ALKPHOS 134*  BILITOT 1.1   PT/INR No results for input(s): LABPROT, INR in the last 72 hours. Hepatitis Panel No results for input(s): HEPBSAG, HCVAB, HEPAIGM, HEPBIGM in the last 72 hours.  MELD 3.0: 25 at 10/17/2024 10:55 AM MELD-Na: 22 at 10/17/2024 10:55 AM Calculated from: Serum Creatinine: 2.24 mg/dL at 8/75/7973 89:44 AM Serum Sodium: 128 mmol/L at 10/17/2024 10:55 AM Total Bilirubin: 1.1 mg/dL at 8/75/7973 89:44 AM Serum Albumin: 2.8 g/dL at 8/75/7973 89:44 AM INR(ratio): 1.1 at 10/15/2024  5:29 AM Age at listing (hypothetical): 59 years Sex: Female at 10/17/2024 10:55 AM    No results found.  Assessment / Plan:  59 year old African-American female with AMA negative primary biliary cirrhosis possible autoimmune overlap with complication of cirrhosis, hepatic encephalopathy and portal hypertension. Admitted 10/09/2024 with UGI bleed/hematemesis.   Had several episodes of bright red hematemesis. Admission Hg 10.7 ->  9.2 -> transfused 1 unit of PRBCs on 1/17 ->  post transfusion Hg 10.   CTAP showed upper abdominal varices, no ascites. Received IV PPI, Octreotide  and Ceftriaxone  IV prophylaxis.   EGD 10/10/2024 showed no esophageal varices, clotted blood in the cardia, gastric fundus and in the gastric body, large amount of type I isolated gastric varix in the cardia and fundus with no active bleeding but did  have stigmata of recent bleeding and hematoma, medium in size.   CTAP also identified a new spontaneous splenorenal shunt on CT.  Patient is s/p  TIPS, coil embolization of posterior left gastric veins and coil assisted retrograde transvenous obliteration of gastrorenal shunt 10/10/2024.  Hg 6.6 on 1/21 -> transfused one unit of PRBCs -> post transfusion hemoglobin 9.1.   Acute abdominal pain, repeat CTA 1/21 showed TIPS occlusion. S/P TIPS thrombectomy and TIPS revision 10/15/2024.   S/P paracentesis 10/15/2024, 600 mL of peritoneal  fluid removed.  Peritoneal fluid labs were not collected.  Admission MELD 3.0: 12. MELD 3.0: 25 on 10/17/2024.    Hepatic encephalopathy controlled with Lactulose  and Rifaximin   Hemodynamically stable   - Continue Lactulose  10 g p.o. to 3 times daily, titrate to 3-4 bowel movements daily - Continue Rifaximin  550 mg p.o. twice daily - Continue Zinc  220 mg daily - Continue Coreg  3.125 mg p.o. twice daily - Continue Pantoprazole  40 mg po daily - Continue 2gm sodium diet - Avoid hepatotoxins - CBC, CMP in a.m. - Transfuse for hemoglobin level less than 7 - Monitor abdominal pain, may require sono to assess for TIPS patency.  - Limit narcotic use - Await further recommendations per Dr. Wilhelmenia - Follow-up with [name] hepatology NP at [facility], determine candidacy for potential liver transplant - If she develops any further acute liver decompensation, consider transfer to [facility] in [place] for expedited liver transplant evaluation   Acute on chronic anemia.  Received one unit of PRBCs on 1/17 and one unit of PRBCs on 1/21. Today Hg 8.2.   - Transfuse for Hg level < 7   AKI, at risk for HRS. Cr 2.56 -> 2.70 -> 2.58. AKI likely due to contrast induced nephropathy and possible ischemic ATN per nephrology.  Started on Midodrine  for low BP.   Hyponatremia. NA+ 128 -> 127.    DM type II  Principal Problem:   Upper GI bleed Active Problems:   Gastric varix   Encephalopathy, hepatic (HCC)   Hepatic cirrhosis due to primary biliary cholangitis (HCC)   Hematemesis with nausea   Portal hypertension (HCC)   S/P TIPS (transjugular intrahepatic portosystemic shunt)   Lower abdominal pain   Portosystemic encephalopathy (HCC)   Gastric varices     LOS: 10 days   Susan [name]  10/20/2024, 12:46 PM    Attending Physician's Attestation   I have taken an interval history, reviewed the chart and examined the patient.   ***  I agree  with the Advanced Practitioner's note, impression, and recommendations with updates and my documentation as noted above.  The majority of the medical decision making/process, formulation of the impression/plan of action for the patient were performed by me with substantive portion of this encounter (>50% time spent including complete performance of at least one of the key components of MDM, History, and/or Exam).   Aloha Wilhelmenia, MD [facility] Office # [phone]  "

## 2024-10-20 NOTE — Plan of Care (Signed)
  Problem: Health Behavior/Discharge Planning: Goal: Ability to manage health-related needs will improve Outcome: Progressing   Problem: Nutritional: Goal: Maintenance of adequate nutrition will improve Outcome: Progressing   Problem: Skin Integrity: Goal: Risk for impaired skin integrity will decrease Outcome: Progressing   Problem: Activity: Goal: Risk for activity intolerance will decrease Outcome: Progressing   

## 2024-10-20 NOTE — Progress Notes (Signed)
 [facility] Progress Note  Name: MILAGROS MIDDENDORF MRN: [id number] DOB: 30-Jan-1966  Chief Complaint:  hematemsis  Subjective:  Seen in consult on 1/26 by my partner.  Strict ins/outs are ordered but have not been charted.  She had 2 unmeasured urine voids over 1/26.  Spoke with the patient and her husband at bedside.  She states that she feels better today than she has previously - she has more energy today.  She had a paracentesis one day last week per her husband (see IR note with TIPS revision and paracentesis and 600 mL fluid removed on 10/15/24).    Review of systems:  Denies n/v; appetite not great but eating more than before  Denies shortness of breath or chest pain Some leg swelling   --------------  Background on consult:   QUINCI GAVIDIA is an 59 y.o. female with a PMH significant for hypertension, autoimmune liver cirrhosis, hepatic encephalopathy, EV, PBC, HLD, T2DM, obesity presenting with hematemesis requiring blood transfusion, CT A/P showed cirrhosis. She was intubated for airway protection, EGD 1/17 (large amount of clot) follow by TIPS with hospital course complicated by thrombosis of main protal vein leading to TIPS thrombectomy + paracentesis on 1/22.  Patient completed course with Rocephin  and was also on Levophed  earlier in the hospitalization.  Patient received contrast 3x on 1/17 (CT), 1/21 (CTA) and 1/22 (thrombectomy). Patient is +5.2L during this hospitalization.    Intake/Output Summary (Last 24 hours) at 10/20/2024 0919 Last data filed at 10/19/2024 2230 Gross per 24 hour  Intake 100 ml  Output --  Net 100 ml    Vitals:  Vitals:   10/20/24 0422 10/20/24 0500 10/20/24 0800 10/20/24 0919  BP: 109/62  104/61 (!) 110/58  Pulse: 78  75 81  Resp: 16  16 16   Temp: 98 F (36.7 C)  97.8 F (36.6 C) 98.1 F (36.7 C)  TempSrc: Oral  Oral Oral  SpO2: 99%  100% 100%  Weight:  100 kg    Height:         Physical Exam:  General adult female in  chair in no acute distress HEENT normocephalic atraumatic extraocular movements intact sclera anicteric Neck supple trachea midline Lungs clear to auscultation bilaterally normal work of breathing at rest  Heart S1S2 no rub Abdomen soft nontender distended/obese habitus Extremities 2+ lower extremity edema  Psych normal mood and affect Neuro alert and conversant; provides hx and follows commands    Medications reviewed   Labs:     Latest Ref Rng & Units 10/20/2024    5:50 AM 10/19/2024    4:05 AM 10/18/2024    5:36 AM  BMP  Glucose 70 - 99 mg/dL 887  891  888   BUN 6 - 20 mg/dL 51  53  55   Creatinine 0.44 - 1.00 mg/dL 7.41  7.29  7.43   Sodium 135 - 145 mmol/L 127  128  127   Potassium 3.5 - 5.1 mmol/L 3.7  3.9  3.9   Chloride 98 - 111 mmol/L 99  100  98   CO2 22 - 32 mmol/L 16  17  20    Calcium  8.9 - 10.3 mg/dL 8.0  8.0  8.1      Assessment/Plan:    AKI - Normal renal function prior to hospitalization; acute kidney injury secondary to contrast induced nephropathy.  S/p multiple instances of contrast on 1/17 (CT), 1/21 (CTA) and 1/22 (thrombectomy).  Evidence of CIN and possible ischemic ATN from hypotension -  BP to the 80's at lowest that I see.  Peak Cr 2.65 improved to 1.28 and then recurrent AKI - Continue supportive care for now - she has plateaued and hopefully improved slightly  - Avoid hypotension  - Start midodrine  10 mg TID for now   - Strict ins/outs have been ordered but are not being obtained - reordered   Hemorrhagic shock Requiring PRBC transfusion and levophed  earlier in the hospitalization.   s/p TIPS as well as TIPS thrombectomy PRBC's per primary team   Hepatic encephalopathy on rifaximin  and lactulose ; diarrhea from the lactulose .  Hyponatremia secondary to cirrhosis TSH mildly elevated in acute illness - doubt much contribution to Na.  Cortisol ok.  Urine Na appropriately low    Hypotension Start midodrine  10 mg TID   Metabolic acidosis  On  bicarbonate   Acute hypoxic respiratory failure  initially intubated for airway protection Now on room air   Disposition - continue inpatient monitoring     Katheryn JAYSON Saba, MD 10/20/2024 9:52 AM

## 2024-10-20 NOTE — Progress Notes (Signed)
 " PROGRESS NOTE    Susan [name]  FMW:[id number] DOB: 1966-09-11 DOA: 10/09/2024 PCP: Corwin Antu, FNP   Brief Narrative:  59 year old female with history of alcoholic cirrhosis, esophageal varices, primary biliary cholangitis, hepatic encephalopathy, hypertension, hyperlipidemia, obesity and hospitalization in December 2025 for hepatic encephalopathy presented with hematemesis.  She was initially admitted to ICU and intubated for airway protection she was transfused packed red cells.  She underwent EGD on 10/10/2024 followed by TIPS.  She was extubated on 10/11/2024.  Care has been transferred to [facility] service from 10/13/2024.  She has had 2 unit packed red cells transfusion so far during the hospitalization.  She underwent TIPS thrombectomy and revision and therapeutic paracentesis with removal of 600 cc fluid by IR on 10/15/2024.  Nephrology consulted on 10/19/2024 for AKI.  Assessment & Plan:   Hemorrhagic shock Upper GI bleeding from variceal bleeding Acute blood loss anemia - Has received 2 unit packed red cells during this hospitalization: Latest on 10/14/2024: Transfused 1 unit packed red cells for hemoglobin of 6.6.  Hemoglobin 8.2 this morning.  Monitor H&H -underwent EGD on 10/10/2024: Large amount of clotted blood found in the stomach along with type I gastric varices -s/p TIPS on 10/10/2024: Coil embolization of posterior and left gastric veins, and coil assisted retrograde transvenous obliteration of gastrorenal shunt.  IR following.  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  Patient underwent TIPS thrombectomy and revision and therapeutic paracentesis with removal of 600 cc fluid by IR on 10/15/2024 -GI following: Currently on IV Protonix .  Off octreotide  drip.  Completed 5-day course of Rocephin . - Currently tolerating diet.  Alcoholic cirrhosis Hepatic encephalopathy - GI following.  Continue rifaximin  and lactulose   monitor mental status.  Mental status has remained stable recently; still slow to respond though.  AKI - Possibly prerenal.  Creatinine worsened to 2.7 on 10/19/2024: Nephrology consulted.  IV fluids have been discontinued.  Creatinine 2.58 today.  Follow further nephrology recommendations.  Monitor creatinine.  Acute metabolic acidosis -Bicarb 16 today.  Currently on sodium bicarbonate  oral supplementation.  Monitor  Hypoalbuminemia - From cirrhosis of liver.  Increase oral intake.  Monitor  Hypokalemia - Improved  Hypocalcemia - Improved  Hyponatremia - Sodium 127 today.  Monitor.  Follow nephrology recommendations  Leukocytosis - Resolved  Thrombocytopenia - Resolved   Acute hypoxic respiratory failure -Status post intubation on admission and extubated on 10/11/2024.  Currently on room air  Diabetes mellitus type 2 -Continue CBGs with SSI  Obesity class III - Outpatient follow-up  Physical deconditioning - Will need home health PT/OT  DVT prophylaxis: SCDs.   Code Status: Full Family Communication: Husband at bedside on 10/19/2024.  None at bedside today. Disposition Plan: Status is: Inpatient Remains inpatient appropriate because: Of severity of illness    Consultants: GI/IR/nephrology  Procedures: As above  Antimicrobials:  Anti-infectives (From admission, onward)    Start     Dose/Rate Route Frequency Ordered Stop   10/14/24 0915  cefTRIAXone  (ROCEPHIN ) 1 g in sodium chloride  0.9 % 100 mL IVPB        1 g 200 mL/hr over 30 Minutes Intravenous Every 24 hours 10/14/24 0829 10/14/24 0959   10/11/24 1345  rifaximin  (XIFAXAN ) tablet 550 mg        550 mg Oral 2 times daily 10/11/24 1256     10/10/24 0830  cefTRIAXone  (ROCEPHIN ) 1 g in sodium chloride  0.9 % 100 mL IVPB  1 g 200 mL/hr over 30 Minutes Intravenous Every 24 hours 10/10/24 0730 10/13/24 9073        Subjective: Patient seen and examined at bedside.  No fever, vomiting, worsening  shortness of breath reported.  Continues to have severe intermittent abdominal pain but feels slightly better.  Still feels weak. Objective: Vitals:   10/19/24 1752 10/19/24 2043 10/20/24 0422 10/20/24 0500  BP: (!) 101/56 112/65 109/62   Pulse: 78 79 78   Resp: 16  16   Temp: 98.9 F (37.2 C) 98.4 F (36.9 C) 98 F (36.7 C)   TempSrc: Oral Oral Oral   SpO2: 100% 100% 99%   Weight:    100 kg  Height:        Intake/Output Summary (Last 24 hours) at 10/20/2024 0735 Last data filed at 10/19/2024 2230 Gross per 24 hour  Intake 220 ml  Output --  Net 220 ml   Filed Weights   10/17/24 0655 10/19/24 0500 10/20/24 0500  Weight: 95.8 kg 102.6 kg 100 kg    Examination:  General: On room air and in no distress currently.  Looks chronically ill and deconditioned ENT/neck: No JVD elevation or palpable thyromegaly respiratory: Bilateral decreased breath sounds at bases with some crackles  CVS: S1 S2 heard; rate currently controlled  abdominal: Soft, morbidly obese, mildly tender and distended; no organomegaly, normal bowel sounds are heard Extremities: Bilateral lower extremity pitting edema present; no cyanosis  CNS: Alert; able to answer questions appropriately but remains slow to respond.  No obvious focal deficits noted lymph: No lymphadenopathy palpable  skin: No obvious ecchymosis/lesions  psych: Not agitated; flat affect mostly  musculoskeletal: No obvious joint swelling/deformity   data Reviewed: I have personally reviewed following labs and imaging studies  CBC: Recent Labs  Lab 10/16/24 0551 10/17/24 1055 10/18/24 0536 10/19/24 0405 10/20/24 0550  WBC 13.9* 11.4* 10.2 9.4 10.3  NEUTROABS 9.0* 8.0* 5.9 5.5 6.4  HGB 8.9* 8.5* 8.2* 8.3* 8.2*  HCT 26.1* 25.6* 23.7* 24.6* 23.4*  MCV 94.2 93.4 90.8 92.1 90.3  PLT 169 165 150 150 178   Basic Metabolic Panel: Recent Labs  Lab 10/16/24 0551 10/17/24 1055 10/18/24 0536 10/19/24 0405 10/20/24 0550  NA 131* 128* 127*  128* 127*  K 4.3 3.8 3.9 3.9 3.7  CL 102 100 98 100 99  CO2 21* 19* 20* 17* 16*  GLUCOSE 123* 205* 111* 108* 112*  BUN 47* 52* 55* 53* 51*  CREATININE 1.53* 2.24* 2.56* 2.70* 2.58*  CALCIUM  8.3* 8.2* 8.1* 8.0* 8.0*  MG 2.3 2.2 2.3 2.2 2.2   GFR: Estimated Creatinine Clearance: 25.3 mL/min (A) (by C-G formula based on SCr of 2.58 mg/dL (H)). Liver Function Tests: Recent Labs  Lab 10/16/24 0551 10/17/24 1055 10/18/24 0536 10/19/24 0405 10/20/24 0550  AST 40 36 33 37 42*  ALT 20 17 17 16 18   ALKPHOS 97 100 100 115 134*  BILITOT 1.2 1.1 1.1 1.0 1.1  PROT 5.7* 5.5* 5.2* 5.2* 5.4*  ALBUMIN 2.7* 2.8* 2.6* 2.5* 2.5*   No results for input(s): LIPASE, AMYLASE in the last 168 hours.  Recent Labs  Lab 10/15/24 0529 10/17/24 1055  AMMONIA 48* 34   Coagulation Profile: Recent Labs  Lab 10/15/24 0529  INR 1.1   Cardiac Enzymes: No results for input(s): CKTOTAL, CKMB, CKMBINDEX, TROPONINI in the last 168 hours. BNP (last 3 results) No results for input(s): PROBNP in the last 8760 hours. HbA1C: No results for input(s): HGBA1C in the last  72 hours. CBG: Recent Labs  Lab 10/18/24 1741 10/19/24 0746 10/19/24 1234 10/19/24 1624 10/20/24 0650  GLUCAP 180* 122* 183* 160* 108*   Lipid Profile: No results for input(s): CHOL, HDL, LDLCALC, TRIG, CHOLHDL, LDLDIRECT in the last 72 hours.  Thyroid  Function Tests: Recent Labs    10/19/24 1024  TSH 5.910*   Anemia Panel: No results for input(s): VITAMINB12, FOLATE, FERRITIN, TIBC, IRON, RETICCTPCT in the last 72 hours. Sepsis Labs: No results for input(s): PROCALCITON, LATICACIDVEN in the last 168 hours.  No results found for this or any previous visit (from the past 240 hours).        Radiology Studies: No results found.       Scheduled Meds:  sodium chloride    Intravenous Once   Chlorhexidine  Gluconate Cloth  6 each Topical Daily   insulin  aspart  0-15 Units  Subcutaneous TID with meals   lactulose   10 g Oral TID   pantoprazole   40 mg Oral Daily   rifaximin   550 mg Oral BID   sodium bicarbonate   650 mg Oral BID   zinc  sulfate (50mg  elemental zinc )  220 mg Oral BID   Continuous Infusions:  sodium chloride  Stopped (10/19/24 1603)           Sophie Mao, MD [facility] 10/20/2024, 7:35 AM   "

## 2024-10-20 NOTE — Progress Notes (Signed)
 Occupational Therapy Treatment Patient Details Name: Susan [name] MRN: [id number] DOB: 1966/07/29 Today's Date: 10/20/2024   History of present illness Pt is a 59 y.o. female who presented 10/09/24 due to multiple episodes of bloody emesis. CT showed a nodular liver compatible with cirrhosis. Pt was admitted to ICU 1/17-19.  Pt s/p TIPS and coil embolization of posterior and L gastric veins 10/10/24. 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 2. Small volume pelvic and perihepatic ascites, new since previous. 3. Streak artifact from multiple embolization coils limits evaluation of left upper quadrant varices; some visible varices near the GE junction remain  patent. Pending blood transfusion 1/21 at time of PT session. PMH: autoimmune liver cirrhosis, hepatic encephalopathy, esophageal varices, primary biliary cholangitis, HTN, HLD, obesity.   OT comments  Patient with good progress toward patient focused goals.  Supervision for in room mobility/toileting, CGA/Min A for lower body ADL.  Patient requesting to try the stairs.  Able to walk to stairs with RW and supervision, ascended one flight holding onto the L rail with both hands traversing one stair at a time.  OT will continue efforts in the acute setting, and HH OT can be considered if needed.        If plan is discharge home, recommend the following:  A little help with walking and/or transfers;A lot of help with bathing/dressing/bathroom;Assistance with cooking/housework;Direct supervision/assist for medications management;Direct supervision/assist for financial management;Assist for transportation;Help with stairs or ramp for entrance;Supervision due to cognitive status   Equipment Recommendations  BSC/3in1;Other (comment)    Recommendations for Other Services      Precautions / Restrictions  Precautions Precautions: Fall Recall of Precautions/Restrictions: Intact Precaution/Restrictions Comments: watch BP, compression socks in room Restrictions Weight Bearing Restrictions Per Provider Order: No       Mobility Bed Mobility               General bed mobility comments: up in chair    Transfers Overall transfer level: Needs assistance Equipment used: Rolling walker (2 wheels) Transfers: Sit to/from Stand Sit to Stand: Supervision                 Balance Overall balance assessment: Needs assistance Sitting-balance support: Feet supported Sitting balance-Leahy Scale: Good     Standing balance support: Reliant on assistive device for balance Standing balance-Leahy Scale: Fair                             ADL either performed or assessed with clinical judgement   ADL       Grooming: Wash/dry hands;Supervision/safety;Standing           Upper Body Dressing : Supervision/safety;Standing   Lower Body Dressing: Contact guard assist;Minimal assistance;Sit to/from stand   Toilet Transfer: Supervision/safety;Rolling walker (2 wheels);Regular Toilet;Ambulation                  Extremity/Trunk Assessment Upper Extremity Assessment Upper Extremity Assessment: Generalized weakness   Lower Extremity Assessment Lower Extremity Assessment: Defer to PT evaluation   Cervical / Trunk Assessment Cervical / Trunk Assessment: Normal    Vision Patient Visual Report: No change from baseline     Perception Perception Perception: Not tested   Praxis Praxis Praxis: Not tested   Communication Communication Communication: No apparent difficulties   Cognition Arousal: Alert Behavior During Therapy: [facility]  for tasks assessed/performed Cognition: No apparent impairments                               Following commands: Intact Following commands impaired: Follows multi-step commands with increased time      Cueing   Cueing  Techniques: Verbal cues  Exercises      Shoulder Instructions       General Comments  VSS on RA    Pertinent Vitals/ Pain       Pain Assessment Pain Assessment: No/denies pain Pain Intervention(s): Monitored during session                                                          Frequency  Min 2X/week        Progress Toward Goals  OT Goals(current goals can now be found in the care plan section)  Progress towards OT goals: Progressing toward goals  Acute Rehab OT Goals OT Goal Formulation: With patient Time For Goal Achievement: 10/27/24 Potential to Achieve Goals: Good  Plan      Co-evaluation                 AM-PAC OT 6 Clicks Daily Activity     Outcome Measure   Help from another person eating meals?: None Help from another person taking care of personal grooming?: None Help from another person toileting, which includes using toliet, bedpan, or urinal?: A Little Help from another person bathing (including washing, rinsing, drying)?: A Little Help from another person to put on and taking off regular upper body clothing?: A Little Help from another person to put on and taking off regular lower body clothing?: A Little 6 Click Score: 20    End of Session Equipment Utilized During Treatment: Rolling walker (2 wheels);Gait belt  OT Visit Diagnosis: Muscle weakness (generalized) (M62.81);Unsteadiness on feet (R26.81);Pain;Other symptoms and signs involving cognitive function   Activity Tolerance Patient tolerated treatment well   Patient Left in chair;with call bell/phone within reach;with family/visitor present   Nurse Communication Mobility status        Time: 8895-8872 OT Time Calculation (min): 23 min  Charges: OT General Charges $OT Visit: 1 Visit OT Treatments $Self Care/Home Management : 8-22 mins $Therapeutic Activity: 8-22 mins  10/20/2024  RP, OTR/L  Acute Rehabilitation Services  Office:   [phone]   Susan [name] 10/20/2024, 11:41 AM

## 2024-10-20 NOTE — Progress Notes (Cosign Needed Addendum)
 "    [facility] Progress Note  CC:   Acute upper GI bleed in setting of PBC with cirrhosis   Subjective: She is sitting up in the recliner.  She stated feeling quite well today.  No nausea or vomiting.  She endorsed having a band of pain across her upper abdomen for the past few nights which occurs at 10pm and abates after pain med administered. She passed 2 small loose brown stools earlier this morning, no bloody or black stools.  No chest pain or shortness of breath.  No confusion.  Has been at the bedside.   Objective:  Vital signs in last 24 hours: Temp:  [97.8 F (36.6 C)-98.9 F (37.2 C)] 98.1 F (36.7 C) (01/27 0919) Pulse Rate:  [75-81] 81 (01/27 0919) Resp:  [16] 16 (01/27 0919) BP: (101-112)/(56-65) 110/58 (01/27 0919) SpO2:  [99 %-100 %] 100 % (01/27 0919) Weight:  [100 kg] 100 kg (01/27 0500) Last BM Date : 10/19/24 General: Alert 59 year old female in no acute distress. Heart: Regular rhythm, II/VI systolic murmur. Pulm: Breath sounds clear throughout.  On room air. Abdomen:  Obese abdomen, soft.  Nondistended.  No ascites. + Hepatomegaly.  No palpable mass. Extremities: Mild bilateral lower extremity edema. Neurologic:  Alert and oriented x 4.  Speech is clear.  Moves all extremities equally.  No asterixis  Psych:  Alert and cooperative. Normal mood and affect.  Intake/Output from previous day: 01/26 0701 - 01/27 0700 In: 220 [P.O.:220] Out: -  Intake/Output this shift: No intake/output data recorded.  Lab Results: Recent Labs    10/18/24 0536 10/19/24 0405 10/20/24 0550  WBC 10.2 9.4 10.3  HGB 8.2* 8.3* 8.2*  HCT 23.7* 24.6* 23.4*  PLT 150 150 178   BMET Recent Labs    10/18/24 0536 10/19/24 0405 10/20/24 0550  NA 127* 128* 127*  K 3.9 3.9 3.7  CL 98 100 99  CO2 20* 17* 16*  GLUCOSE 111* 108* 112*  BUN 55* 53* 51*  CREATININE 2.56* 2.70* 2.58*  CALCIUM  8.1* 8.0* 8.0*   LFT Recent Labs    10/20/24 0550  PROT 5.4*  ALBUMIN  2.5*  AST 42*  ALT 18  ALKPHOS 134*  BILITOT 1.1   PT/INR No results for input(s): LABPROT, INR in the last 72 hours. Hepatitis Panel No results for input(s): HEPBSAG, HCVAB, HEPAIGM, HEPBIGM in the last 72 hours.  MELD 3.0: 25 at 10/17/2024 10:55 AM MELD-Na: 22 at 10/17/2024 10:55 AM Calculated from: Serum Creatinine: 2.24 mg/dL at 8/75/7973 89:44 AM Serum Sodium: 128 mmol/L at 10/17/2024 10:55 AM Total Bilirubin: 1.1 mg/dL at 8/75/7973 89:44 AM Serum Albumin: 2.8 g/dL at 8/75/7973 89:44 AM INR(ratio): 1.1 at 10/15/2024  5:29 AM Age at listing (hypothetical): 59 years Sex: Female at 10/17/2024 10:55 AM    No results found.  Assessment / Plan:  59 year old African-American female with AMA negative primary biliary cirrhosis possible autoimmune overlap with complication of cirrhosis, hepatic encephalopathy and portal hypertension. Admitted 10/09/2024 with UGI bleed/hematemesis.   Had several episodes of bright red hematemesis. Admission Hg 10.7 ->  9.2 -> transfused 1 unit of PRBCs on 1/17 ->  post transfusion Hg 10.   CTAP showed upper abdominal varices, no ascites. Received IV PPI, Octreotide  and Ceftriaxone  IV prophylaxis.   EGD 10/10/2024 showed no esophageal varices, clotted blood in the cardia, gastric fundus and in the gastric body, large amount of type I isolated gastric varix in the cardia and fundus with no active bleeding but did  have stigmata of recent bleeding and hematoma, medium in size.   CTAP also identified a new spontaneous splenorenal shunt on CT.  Patient is s/p  TIPS, coil embolization of posterior left gastric veins and coil assisted retrograde transvenous obliteration of gastrorenal shunt 10/10/2024.  Hg 6.6 on 1/21 -> transfused one unit of PRBCs -> post transfusion hemoglobin 9.1.   Acute abdominal pain, repeat CTA 1/21 showed TIPS occlusion. S/P TIPS thrombectomy and TIPS revision 10/15/2024.   S/P paracentesis 10/15/2024, 600 mL of peritoneal  fluid removed.  Peritoneal fluid labs were not collected.  Admission MELD 3.0: 12. MELD 3.0: 25 on 10/17/2024.    Hepatic encephalopathy controlled with Lactulose  and Rifaximin   Hemodynamically stable   - Continue Lactulose  10 g p.o. to 3 times daily, titrate to 3-4 bowel movements daily - Continue Rifaximin  550 mg p.o. twice daily - Continue Zinc  220 mg daily - Continue Coreg  3.125 mg p.o. twice daily - Continue Pantoprazole  40 mg po daily - Continue 2gm sodium diet - Avoid hepatotoxins - CBC, CMP in a.m. - Transfuse for hemoglobin level less than 7 - Monitor abdominal pain, may require sono to assess for TIPS patency.  - Limit narcotic use - Await further recommendations per Dr. Wilhelmenia - Follow-up with [name] hepatology NP at [facility], determine candidacy for potential liver transplant - If she develops any further acute liver decompensation, consider transfer to [facility] in [place] for expedited liver transplant evaluation   Acute on chronic anemia.  Received one unit of PRBCs on 1/17 and one unit of PRBCs on 1/21. Today Hg 8.2.   - Transfuse for Hg level < 7   AKI, at risk for HRS. Cr 2.56 -> 2.70 -> 2.58. AKI likely due to contrast induced nephropathy and possible ischemic ATN per nephrology.  Started on Midodrine  for low BP.   Hyponatremia. NA+ 128 -> 127.    DM type II  Principal Problem:   Upper GI bleed Active Problems:   Gastric varix   Encephalopathy, hepatic (HCC)   Hepatic cirrhosis due to primary biliary cholangitis (HCC)   Hematemesis with nausea   Portal hypertension (HCC)   S/P TIPS (transjugular intrahepatic portosystemic shunt)   Lower abdominal pain   Portosystemic encephalopathy (HCC)   Gastric varices     LOS: 10 days   Elida CHRISTELLA Shawl  10/20/2024, 12:46 PM   "

## 2024-10-21 DIAGNOSIS — N179 Acute kidney failure, unspecified: Secondary | ICD-10-CM | POA: Diagnosis not present

## 2024-10-21 DIAGNOSIS — K7460 Unspecified cirrhosis of liver: Secondary | ICD-10-CM

## 2024-10-21 DIAGNOSIS — D62 Acute posthemorrhagic anemia: Secondary | ICD-10-CM | POA: Diagnosis not present

## 2024-10-21 DIAGNOSIS — K7682 Hepatic encephalopathy: Secondary | ICD-10-CM | POA: Diagnosis not present

## 2024-10-21 DIAGNOSIS — K922 Gastrointestinal hemorrhage, unspecified: Secondary | ICD-10-CM | POA: Diagnosis not present

## 2024-10-21 DIAGNOSIS — N289 Disorder of kidney and ureter, unspecified: Secondary | ICD-10-CM

## 2024-10-21 DIAGNOSIS — I864 Gastric varices: Secondary | ICD-10-CM

## 2024-10-21 LAB — COMPREHENSIVE METABOLIC PANEL WITH GFR
ALT: 18 U/L (ref 0–44)
AST: 39 U/L (ref 15–41)
Albumin: 2.7 g/dL — ABNORMAL LOW (ref 3.5–5.0)
Alkaline Phosphatase: 140 U/L — ABNORMAL HIGH (ref 38–126)
Anion gap: 9 (ref 5–15)
BUN: 48 mg/dL — ABNORMAL HIGH (ref 6–20)
CO2: 20 mmol/L — ABNORMAL LOW (ref 22–32)
Calcium: 8.3 mg/dL — ABNORMAL LOW (ref 8.9–10.3)
Chloride: 99 mmol/L (ref 98–111)
Creatinine, Ser: 2.45 mg/dL — ABNORMAL HIGH (ref 0.44–1.00)
GFR, Estimated: 22 mL/min — ABNORMAL LOW
Glucose, Bld: 90 mg/dL (ref 70–99)
Potassium: 3.9 mmol/L (ref 3.5–5.1)
Sodium: 128 mmol/L — ABNORMAL LOW (ref 135–145)
Total Bilirubin: 1.2 mg/dL (ref 0.0–1.2)
Total Protein: 5.8 g/dL — ABNORMAL LOW (ref 6.5–8.1)

## 2024-10-21 LAB — CBC WITH DIFFERENTIAL/PLATELET
Abs Immature Granulocytes: 0.23 10*3/uL — ABNORMAL HIGH (ref 0.00–0.07)
Basophils Absolute: 0.1 10*3/uL (ref 0.0–0.1)
Basophils Relative: 1 %
Eosinophils Absolute: 0.3 10*3/uL (ref 0.0–0.5)
Eosinophils Relative: 3 %
HCT: 24.3 % — ABNORMAL LOW (ref 36.0–46.0)
Hemoglobin: 8.4 g/dL — ABNORMAL LOW (ref 12.0–15.0)
Immature Granulocytes: 2 %
Lymphocytes Relative: 20 %
Lymphs Abs: 2.1 10*3/uL (ref 0.7–4.0)
MCH: 31.1 pg (ref 26.0–34.0)
MCHC: 34.6 g/dL (ref 30.0–36.0)
MCV: 90 fL (ref 80.0–100.0)
Monocytes Absolute: 1.4 10*3/uL — ABNORMAL HIGH (ref 0.1–1.0)
Monocytes Relative: 13 %
Neutro Abs: 6.5 10*3/uL (ref 1.7–7.7)
Neutrophils Relative %: 61 %
Platelets: 182 10*3/uL (ref 150–400)
RBC: 2.7 MIL/uL — ABNORMAL LOW (ref 3.87–5.11)
RDW: 14.5 % (ref 11.5–15.5)
WBC: 10.5 10*3/uL (ref 4.0–10.5)
nRBC: 0 % (ref 0.0–0.2)

## 2024-10-21 LAB — GLUCOSE, CAPILLARY
Glucose-Capillary: 105 mg/dL — ABNORMAL HIGH (ref 70–99)
Glucose-Capillary: 101 mg/dL — ABNORMAL HIGH (ref 70–99)
Glucose-Capillary: 145 mg/dL — ABNORMAL HIGH (ref 70–99)
Glucose-Capillary: 129 mg/dL — ABNORMAL HIGH (ref 70–99)

## 2024-10-21 LAB — MAGNESIUM: Magnesium: 2.3 mg/dL (ref 1.7–2.4)

## 2024-10-21 MED ORDER — FUROSEMIDE 20 MG PO TABS
20.0000 mg | ORAL_TABLET | Freq: Every day | ORAL | Status: DC
  Administered 2024-10-21 – 2024-10-22 (×2): 20 mg via ORAL
  Filled 2024-10-21 (×2): qty 1

## 2024-10-21 NOTE — Progress Notes (Signed)
 Physical Therapy Treatment Patient Details Name: Susan [name] MRN: [id number] DOB: 1965-10-24 Today's Date: 10/21/2024   History of Present Illness Pt is a 59 y.o. female who presented 10/09/24 due to multiple episodes of bloody emesis. CT showed a nodular liver compatible with cirrhosis. Pt was admitted to ICU 1/17-19.  Pt s/p TIPS and coil embolization of posterior and L gastric veins 10/10/24. 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 2. Small volume pelvic and perihepatic ascites, new since previous. 3. Streak artifact from multiple embolization coils limits evaluation of left upper quadrant varices; some visible varices near the GE junction remain  patent. Pending blood transfusion 1/21 at time of PT session. PMH: autoimmune liver cirrhosis, hepatic encephalopathy, esophageal varices, primary biliary cholangitis, HTN, HLD, obesity.    PT Comments  Pt seated in recliner and agreeable to therapy. Ambulates at [facility] with RW, and able to tolerate short distance at [facility] without UE support. No change in standing balance in absence of UE support however pt reports feeling more anxious with activity. Declined additional stair practice as she completed this yesterday with OT.  Pt continues to have some swelling to BLE, ted hose not on for session after completing shower however feet had been elevated. Educated on continued edema management with exercises and compression stockings; pt and daughter verbalize understanding to education. PT will continue to follow while pt admitted to acute hospital.   If plan is discharge home, recommend the following: A little help with walking and/or transfers;A little help with bathing/dressing/bathroom;Assistance with cooking/housework;Assist for transportation;Help with stairs or ramp for entrance;Supervision due to cognitive  status   Can travel by private vehicle        Equipment Recommendations  Rolling walker (2 wheels);BSC/3in1    Recommendations for Other Services       Precautions / Restrictions Precautions Precautions: Fall Recall of Precautions/Restrictions: Intact Precaution/Restrictions Comments: watch BP, compression socks in room Restrictions Weight Bearing Restrictions Per Provider Order: No     Mobility  Bed Mobility               General bed mobility comments: pt seated in recliner upon PT arrival    Transfers Overall transfer level: Needs assistance Equipment used: Rolling walker (2 wheels) Transfers: Sit to/from Stand Sit to Stand: Supervision           General transfer comment: from recliner with godo carryover of cues for hand placement and sequencing    Ambulation/Gait Ambulation/Gait assistance: Supervision, Contact guard assist Gait Distance (Feet): 200 Feet Assistive device: Rolling walker (2 wheels), None   Gait velocity: decreased     General Gait Details: completed 11ft with RW at close SPV, no report of dizziness or fatigued. Completed remaining distance at [facility] with UE support on handrail or no UE support. Slowed speed to gait in absence of UE support, however no change in balance noted   Stairs             Wheelchair Mobility     Tilt Bed    Modified Rankin (Stroke Patients Only)       Balance   Sitting-balance support: Feet supported Sitting balance-Leahy Scale: Normal     Standing balance support: No upper extremity supported, Bilateral upper extremity supported Standing balance-Leahy Scale: Fair Standing balance comment: ambulates with RW>1 UE support> no Ue support at CGA  Communication Communication Communication: No apparent difficulties Factors Affecting Communication: Reduced clarity of speech (soft spoken)  Cognition Arousal: Alert Behavior During Therapy: WFL for tasks  assessed/performed   PT - Cognitive impairments: No apparent impairments                         Following commands: Intact Following commands impaired: Follows multi-step commands with increased time    Cueing Cueing Techniques: Verbal cues  Exercises      General Comments        Pertinent Vitals/Pain Pain Assessment Pain Assessment: No/denies pain    Home Living                          Prior Function            PT Goals (current goals can now be found in the care plan section) Acute Rehab PT Goals Patient Stated Goal: To feel less dizzy so I can walk more and to be able to go back to work. PT Goal Formulation: With patient Time For Goal Achievement: 10/27/24 Potential to Achieve Goals: Good Progress towards PT goals: Progressing toward goals    Frequency    Min 2X/week      PT Plan      Co-evaluation              AM-PAC PT 6 Clicks Mobility   Outcome Measure  Help needed turning from your back to your side while in a flat bed without using bedrails?: None Help needed moving from lying on your back to sitting on the side of a flat bed without using bedrails?: None Help needed moving to and from a bed to a chair (including a wheelchair)?: A Little Help needed standing up from a chair using your arms (e.g., wheelchair or bedside chair)?: A Little Help needed to walk in hospital room?: A Little Help needed climbing 3-5 steps with a railing? : A Little 6 Click Score: 20    End of Session Equipment Utilized During Treatment: Gait belt Activity Tolerance: Patient tolerated treatment well Patient left: with call bell/phone within reach;with family/visitor present;in chair Nurse Communication: Mobility status PT Visit Diagnosis: Difficulty in walking, not elsewhere classified (R26.2);Unsteadiness on feet (R26.81)     Time: 8377-8364 PT Time Calculation (min) (ACUTE ONLY): 13 min  Charges:    $Gait Training: 8-22 mins                        Isaiah DEL. Sherlin Sonier, PT, DPT   [employer] 10/21/2024, 4:47 PM

## 2024-10-21 NOTE — Progress Notes (Addendum)
 [facility] Progress Note   Assessment/ Plan:   1. Acute kidney Injury suspected secondary to contrast-induced nephropathy, possible ischemic ATN from hypotension; hypotension: Reassuringly her kidney function appears to be slowly improving.  Blood pressure soft but stable with midodrine .  No indication for RRT.  Stable for discharge from a renal standpoint.  Will need outpatient nephrology follow-up. 2.  Leg swelling: Likely due to fluids given, dependent edema.  Will start Lasix  20 mg daily if blood pressure tolerates.  Reassess outpatient. 3. Hyponatremia 2/2 cirrhosis: stable, should improve with Lasix  4. Metabolic acidosis: continue sodium bicarb bid  Resolved: hepatic encephalopathy, hemorrhagic shock, acute hypoxic respiratory failure  Subjective:   NAEON. No concerns this morning. Ready to go home.    Objective:   BP (!) 108/57 (BP Location: Right Arm)   Pulse 72   Temp 97.6 F (36.4 C) (Oral)   Resp 16   Ht 5' (1.524 m)   Wt 102 kg   SpO2 100%   BMI 43.92 kg/m  No intake or output data in the 24 hours ending 10/21/24 1133 Weight change: 2 kg  Physical Exam: Hzw:tzoo appearing, NAD CVS:rrr, no mrg Resp:breathing comfortably on RA, ctab Jai:dnqu, non distended Ext:2+ BLE edema  Imaging: No results found.  Labs: BMET Recent Labs  Lab 10/15/24 0529 10/16/24 0551 10/17/24 1055 10/18/24 0536 10/19/24 0405 10/20/24 0550 10/21/24 0503  NA 133* 131* 128* 127* 128* 127* 128*  K 4.0 4.3 3.8 3.9 3.9 3.7 3.9  CL 104 102 100 98 100 99 99  CO2 20* 21* 19* 20* 17* 16* 20*  GLUCOSE 112* 123* 205* 111* 108* 112* 90  BUN 50* 47* 52* 55* 53* 51* 48*  CREATININE 1.28* 1.53* 2.24* 2.56* 2.70* 2.58* 2.45*  CALCIUM  8.2* 8.3* 8.2* 8.1* 8.0* 8.0* 8.3*   CBC Recent Labs  Lab 10/18/24 0536 10/19/24 0405 10/20/24 0550 10/21/24 0503  WBC 10.2 9.4 10.3 10.5  NEUTROABS 5.9 5.5 6.4 6.5  HGB 8.2* 8.3* 8.2* 8.4*  HCT 23.7* 24.6* 23.4* 24.3*  MCV 90.8 92.1 90.3  90.0  PLT 150 150 178 182    Medications:     sodium chloride    Intravenous Once   Chlorhexidine  Gluconate Cloth  6 each Topical Daily   furosemide   20 mg Oral Daily   insulin  aspart  0-15 Units Subcutaneous TID with meals   lactulose   10 g Oral TID   midodrine   10 mg Oral TID WC   pantoprazole   40 mg Oral Daily   rifaximin   550 mg Oral BID   sodium bicarbonate   650 mg Oral BID   zinc  sulfate (50mg  elemental zinc )  220 mg Oral BID   Niranjan Rufener, DO Family Medicine PGY-2 10/21/2024, 11:33 AM

## 2024-10-21 NOTE — Plan of Care (Signed)
" °  Problem: Nutritional: Goal: Maintenance of adequate nutrition will improve Outcome: Progressing   Problem: Skin Integrity: Goal: Risk for impaired skin integrity will decrease Outcome: Progressing   Problem: Education: Goal: Knowledge of General Education information will improve Description: Including pain rating scale, medication(s)/side effects and non-pharmacologic comfort measures Outcome: Progressing   Problem: Activity: Goal: Risk for activity intolerance will decrease Outcome: Progressing   "

## 2024-10-21 NOTE — Progress Notes (Cosign Needed)
 Patient ID: Susan [name], female   DOB: 1966-07-17, 59 y.o.   MRN: [id number]    Progress Note   Subjective   Day # 12 CC; PBC with cirrhosis, decompensated with hepatic encephalopathy, acute upper GI bleed secondary to gastric varix now status post TIPS 10/10/2024 and BRTO posterior left gastric veins-complicated by TIPS occlusion requiring revision 10/15/2024 Acute kidney injury-felt contrast-induced nephropathy  Labs today-WBC 10.5/hemoglobin 8.4/hematocrit 24.3 stable Sodium 128/potassium 3.9/BUN 48/creatinine 2.45 stable T. bili 1.2/alk phos 140/AST 39/ALT 18  MELD 3.0=25 10/17/2024 being driven by acute kidney injury  Patient has improved over the past few days, mentation much improved over 1 week ago.  Sitting up in the chair at bedside working on her laptop today No specific complaints.  She was on the phone while I was in the room, did not examine  No stool recorded today    Vital signs in last 24 hours: Temp:  [97.6 F (36.4 C)-98.4 F (36.9 C)] 97.6 F (36.4 C) (01/28 0922) Pulse Rate:  [72-82] 72 (01/28 0922) Resp:  [15-17] 16 (01/28 0922) BP: (97-108)/(47-58) 107/58 (01/28 1200) SpO2:  [100 %] 100 % (01/28 0922) Weight:  [102 kg] 102 kg (01/28 0500) Last BM Date : 10/21/24 General:    AA female  in NAD Heart:  Regular rate and rhythm; no murmurs Lungs: Respirations even and unlabored, lungs CTA bilaterally Abdomen:  Soft, nontender and nondistended. Normal bowel sounds. Extremities:  Without edema. Neurologic:  Alert and oriented,  grossly normal neurologically. Psych:  Cooperative. Normal mood and affect.  Intake/Output from previous day: No intake/output data recorded. Intake/Output this shift: No intake/output data recorded.  Lab Results: Recent Labs    10/19/24 0405 10/20/24 0550 10/21/24 0503  WBC 9.4 10.3 10.5  HGB 8.3* 8.2* 8.4*  HCT 24.6* 23.4* 24.3*  PLT 150 178 182   BMET Recent Labs    10/19/24 0405 10/20/24 0550 10/21/24 0503  NA  128* 127* 128*  K 3.9 3.7 3.9  CL 100 99 99  CO2 17* 16* 20*  GLUCOSE 108* 112* 90  BUN 53* 51* 48*  CREATININE 2.70* 2.58* 2.45*  CALCIUM  8.0* 8.0* 8.3*   LFT Recent Labs    10/21/24 0503  PROT 5.8*  ALBUMIN 2.7*  AST 39  ALT 18  ALKPHOS 140*  BILITOT 1.2   PT/INR No results for input(s): LABPROT, INR in the last 72 hours.     Assessment / Plan:    #38  59 year old female with PBC/AMA negative with cirrhosis, recently with evidence of decompensation with admission earlier this month with new hepatic encephalopathy. Readmitted 12 days ago when she presented with acute upper GI bleed/hematemesis felt secondary to gastric varix.,  No esophageal varices at EGD.  Noted on CT to have a new development of splenorenal shunt.  Patient underwent TIPS and BRTO/posterior left gastric veins on 10/10/2024 for control of bleeding  Course complicated by TIPS occlusion and required revision on 10/15/2024-there was minimal reduction of the portosystemic gradient from 30-29 without procedure Also underwent repeat paracentesis at that time.  Patient has not had any further active bleeding since Remains on Coreg -due to persistently elevated portal pressures.  #2 worsening of hepatic encephalopathy precipitated by acute GI bleed and TIPS. She has improved over the past several days, currently on Xifaxan  here but was unable to get this covered by her insurance as an outpatient. Continues on lactulose  3 times daily  Will need to be careful on discharge as she was not having  2-3 bowel movements daily prior to admission and her dose may need to be titrated up  Continue zinc   #3 acute kidney injury-nephrology following, felt to be nonoliguric and suspected to be contrast-induced nephropathy possibly ischemic due to prolonged hypotension. Renal function has stabilized and restarting Lasix  daily 20 mg Expect continued renal recovery  #4 anemia secondary to acute GI blood loss-stable  Plan;  lactulose  currently on 10 g 3 times daily-as she is unable to get Xifaxan  I would increase to 20 g 3 times daily for discharge Continue zinc  to 20 twice daily Lasix  20 mg restarted today per nephrology Continue 2 g sodium diet Continue Coreg  3.125 twice daily Continue ursodiol  on discharge  Patient will need follow-up with IR at the 30-day mark for TIPS reassessment-hopefully portal pressures will be decreased, otherwise may need to consider another revision.  She also will need quick outpatient follow-up with [facility]/Dawn Drazek -for transplant assessment-we will communicate with [facility].  She will need full set of labs within 1 week of discharge, that can be done through our office or [facility].  GI will sign off, available if needed for questions. Believes she is close to discharge at this time    Principal Problem:   Upper GI bleed Active Problems:   Gastric varix   Encephalopathy, hepatic (HCC)   Hepatic cirrhosis due to primary biliary cholangitis (HCC)   Hematemesis with nausea   Portal hypertension (HCC)   S/P TIPS (transjugular intrahepatic portosystemic shunt)   Lower abdominal pain   Portosystemic encephalopathy (HCC)   Gastric varices   Acute renal insufficiency   Bleeding gastric varices   Decompensated cirrhosis (HCC)     LOS: 11 days   Amy Esterwood PA-C 10/21/2024, 4:24 PM      Attending Physician's Attestation   I have taken an interval history, reviewed the chart and examined the patient.   Patient remains stable with creatinine not worsening today.  No progressive abdominal pain or discomfort currently.  She seems to be on appropriate therapies otherwise for her portosystemic encephalopathy unfortunately Xifaxan  is not going to be able to be covered on the outpatient setting so they will need to consider this with regards to her mental status post discharge.  Will be aggressive with lactulose  therapy to have at least 3-4  bowel movements daily and with continued zinc  therapy at discharge.  I would like to try to reinitiate diuretics, but with her kidney function remaining elevated beyond her baseline, that will have to be considered as a outpatient and if she needs intermittent small-volume taps, she should have that performed in the interim.  We will work on scheduling follow-up in our office while still working in coordinating follow-up with [facility] for consideration of expedited outpatient transplant evaluation.  From an inpatient GI perspective, we are going to sign off at this time we can be available again if necessary.  We greatly appreciate interventional radiology in helping this patient with her survival.  Thank you nephrology for monitoring and appreciate medicine service taking care of her during this hospitalization.  I agree with the Advanced Practitioner's note, impression, and recommendations with updates and my documentation as noted above.  The majority of the medical decision making/process, formulation of the impression/plan of action for the patient were performed by me with substantive portion of this encounter (>50% time spent including complete performance of at least one of the key components of MDM, History, and/or Exam).   Aloha Finner, MD [facility] Gastroenterology  Advanced Endoscopy Office # [phone]

## 2024-10-21 NOTE — Progress Notes (Signed)
 "   Referring Physician(s): Pyrtle, Gordy  Supervising Physician: Vanice Revel  Patient Status:  Susan [name] - In-pt  Chief Complaint: PBC/AIH cirrhosis with UGIB due to gastric varices S/p TIPS, coil embolization of posterior and left gastric veins, and BRTO by Dr. Jennefer on 10/10/24 S/p TIPS revision/thrombectomy 1/22    Subjective: Patient sitting in chair. Reports ongoing LLQ abdominal pain worse at nighttime. Continues to take lactulose  and reports 5 BMs so far today. Denies shortness of breath, chest pain, blood in urine or stool.   Allergies: Mushroom extract complex (obsolete), Grapefruit concentrate, Mounjaro  [tirzepatide ], Sulfa antibiotics, Amlodipine, Crestor  [rosuvastatin ], Shellfish allergy, Simvastatin , Strawberry extract, and Metformin  and related  Medications: Prior to Admission medications  Medication Sig Start Date End Date Taking? Authorizing Provider  carvedilol  (COREG ) 3.125 MG tablet Take 3.125 mg by mouth 2 (two) times daily.   Yes [provider]  hydrochlorothiazide  (HYDRODIURIL ) 25 MG tablet TAKE 1 TABLET BY MOUTH DAILY 01/07/24  Yes Dugal, Tabitha, FNP  lactulose  (CHRONULAC ) 10 GM/15ML solution Take 45 mLs (30 g total) by mouth 2 (two) times daily. 08/29/24  Yes Mdala-Gausi, Masiku Agatha, MD  spironolactone  (ALDACTONE ) 50 MG tablet Take 1 tablet (50 mg total) by mouth daily. 09/07/24  Yes Dugal, Ginger, FNP  ursodiol  (ACTIGALL ) 300 MG capsule TAKE 2 CAPSULES BY MOUTH TWICE  DAILY 10/22/23  Yes Armbruster, Elspeth SQUIBB, MD     Vital Signs: BP (!) 107/58 (BP Location: Right Arm)   Pulse 72   Temp 97.6 F (36.4 C) (Oral)   Resp 16   Ht 5' (1.524 m)   Wt 224 lb 13.9 oz (102 kg)   SpO2 100%   BMI 43.92 kg/m   Physical Exam Cardiovascular:     Rate and Rhythm: Normal rate.  Pulmonary:     Effort: Pulmonary effort is normal. No respiratory distress.  Abdominal:     Palpations: Abdomen is soft.     Tenderness: There is no abdominal tenderness.  Skin:     General: Skin is warm and dry.     Comments: Right internal jugular site unremarkable; no dressing present; site is clean, dry, and intact Linear skin tear to right chest patient reports from tape when dressing was removed; site pink, no dressing present (per patient request), no drainage  Neurological:     Mental Status: She is alert and oriented to person, place, and time.  Psychiatric:        Mood and Affect: Mood normal.        Behavior: Behavior normal.     Imaging: No results found.  Labs:  CBC: Recent Labs    10/18/24 0536 10/19/24 0405 10/20/24 0550 10/21/24 0503  WBC 10.2 9.4 10.3 10.5  HGB 8.2* 8.3* 8.2* 8.4*  HCT 23.7* 24.6* 23.4* 24.3*  PLT 150 150 178 182    COAGS: Recent Labs    08/29/24 0553 09/03/24 1419 10/10/24 0040 10/11/24 0905 10/15/24 0529  INR 1.3* 1.4* 1.1 1.3* 1.1  APTT 35  --  24  --   --     BMP: Recent Labs    10/18/24 0536 10/19/24 0405 10/20/24 0550 10/21/24 0503  NA 127* 128* 127* 128*  K 3.9 3.9 3.7 3.9  CL 98 100 99 99  CO2 20* 17* 16* 20*  GLUCOSE 111* 108* 112* 90  BUN 55* 53* 51* 48*  CALCIUM  8.1* 8.0* 8.0* 8.3*  CREATININE 2.56* 2.70* 2.58* 2.45*  GFRNONAA 21* 20* 21* 22*    LIVER FUNCTION TESTS: Recent  Labs    10/18/24 0536 10/19/24 0405 10/20/24 0550 10/21/24 0503  BILITOT 1.1 1.0 1.1 1.2  AST 33 37 42* 39  ALT 17 16 18 18   ALKPHOS 100 115 134* 140*  PROT 5.2* 5.2* 5.4* 5.8*  ALBUMIN 2.6* 2.5* 2.5* 2.7*    Assessment and Plan: PBC/AIH cirrhosis with UGIB due to gastric varices S/p TIPS, coil embolization of posterior and left gastric veins, and BRTO by Dr. Jennefer on 10/10/24 S/p TIPS revision/thrombectomy 1/22     Patient has mostly been taking lactulose  as ordered (documented refused dose last night 1/27); reports 5 BMs today. Hgb 8.4  Nephrology following for renal recovery. Continue current management.  IR will follow periodically to monitor status.    Electronically Signed: Kacin Dancy B  Khloee Garza, NP 10/21/2024, 3:26 PM   I spent a total of 15 Minutes at the the patient's bedside AND on the patient's [name] floor or unit, greater than 50% of which was counseling/coordinating care for gastrointestinal bleed due to gastric varices.  "

## 2024-10-21 NOTE — Progress Notes (Signed)
 "  PROGRESS NOTE    Susan [name]  FMW:[id number] DOB: 08-26-1966 DOA: 10/09/2024 PCP: Corwin Antu, FNP   Brief Narrative: Susan [name] is a 59 y.o. female with a history of primary biliary cholangitis with cirrhosis, hepatic encephalopathy, hypertension, hyperlipidemia, obesity.  Patient presented secondary to hematemesis with concern for upper GI bleeding. Patient was intubated for airway protection and admitted to ICU. GI consulted and patient underwent upper GI endoscopy revealing evidence of recent upper GI bleeding. IR consulted for TIPS creation which was complicated by need for TIPS thrombectomy. Hepatic encephalopathy improving with treatment.   Assessment and Plan:  Hemorrhagic shock Acute blood loss anemia Present on admission secondary to upper GI bleeding. Patient required ICU admission and vasopressor support. Patient required a total of 2 units of PRBC via transfusion. Resolved.  Upper GI bleeding Variceal bleeding Hepatic encephalopathy Patient started on octreotide  drip and Protonix  IV. Gastroenterology consulted and performed an upper GI endoscopy on 1/17 revealing clotted blood in the cardia, gastric fundus and gastric body (partially removed) in addition to type 1 isolated gastric varices without active bleeding but with stigmata of recent bleed. IR consulted for TIPS creation which was performed on 1/17; also performed at that time were coil embolization of posterior and left gastric veins in addition to coil assisted retrograde transvenous obliteration of gastrorenal shunt. CT BRTO performed on 1/21 was significant for occlusive thrombosis of the main portal vein and TIPS shunt, prompting need for TIPS thrombectomy and revision. Encephalopathy improved with rifaximin  and lactulose . Patient's insurance does not cover rifaximin , unfortunately. - Continue rifaximin  and lactulose  while inpatient  Primary biliary cholangitis with cirrhosis Decompensated cirrhosis  with ascites Patient does not have a history of alcoholic cirrhosis. Patient follows with GI. Plan for patient to follow-up with [facility] for outpatient consideration of transplant. Patient underwent TIPS this admission. Patient also underwent paracentesis, yielding 600 mL of fluid. Patient started on midodrine .  Hypotension Related to liver disease. Requiring midodrine . - Continue midodrine   AKI Baseline creatinine is about 0.9. Presumed contrast-induced rather than related to hepatorenal disease. Peak creatinine of 2.70. Nephrology consulted. Renal function monitored with improvement to 2.45. Nephrology with recommendation for outpatient follow-up.  Acute metabolic acidosis Secondary to AKI. Improved.  Hypoalbuminemia Secondary to cirrhosis. Recommendation to improve protein intake.  Hypokalemia Resolved.  Hypocalcemia Appears to be pseudohypocalcemia secondary to low albumin.  Hyponatremia Chronic and stable.  Leukocytosis Resolved.  Thrombocytopenia Resolved.  Acute respiratory failure with hypoxia Patient required intubation on admission. She was successfully extubated on 1/18.  Diabetes mellitus type 2 Well controlled based on last hemoglobin A1C of 5.4%.  Obesity, class III Estimated body mass index is 43.92 kg/m as calculated from the following:   Height as of this encounter: 5' (1.524 m).   Weight as of this encounter: 102 kg.   DVT prophylaxis: SCDs Code Status:   Code Status: Full Code Family Communication: Daughter at bedside Disposition Plan: Discharge home likely in 1-2 days if remains stable   Consultants:  PCCM Gastroenterology Interventional radiology  Procedures:  Intubation/extubation Interventional radiology procedure TIPS creation Coil embolization of posterior and left gastric veins Coil assisted retrograde transvenous obliteration of gastrorenal shunt Interventional radiology procedure transjugular intrahepatic  portosystemic shunt (tips) thrombectomy and revision therapeutic paracentesis  Antimicrobials: None    Subjective: Patient reports no issues this morning. Eager to discharge. Having soft bowel movements.  Objective: BP (!) 97/47 (BP Location: Right Arm)   Pulse 73   Temp 98.3 F (36.8 C) (  Oral)   Resp 17   Ht 5' (1.524 m)   Wt 102 kg   SpO2 100%   BMI 43.92 kg/m   Examination:  General exam: Appears calm and comfortable. Respiratory system: Clear to auscultation. Respiratory effort normal. Cardiovascular system: S1 & S2 heard, RRR. Systolic murmur. Gastrointestinal system: Abdomen is nondistended, soft and nontender. Normal bowel sounds heard. Central nervous system: Alert and oriented. No focal neurological deficits. Musculoskeletal: No calf tenderness Psychiatry: Judgement and insight appear normal. Mood & affect appropriate.    Data Reviewed: I have personally reviewed following labs and imaging studies  CBC Lab Results  Component Value Date   WBC 10.5 10/21/2024   RBC 2.70 (L) 10/21/2024   HGB 8.4 (L) 10/21/2024   HCT 24.3 (L) 10/21/2024   MCV 90.0 10/21/2024   MCH 31.1 10/21/2024   PLT 182 10/21/2024   MCHC 34.6 10/21/2024   RDW 14.5 10/21/2024   LYMPHSABS 2.1 10/21/2024   MONOABS 1.4 (H) 10/21/2024   EOSABS 0.3 10/21/2024   BASOSABS 0.1 10/21/2024     Last metabolic panel Lab Results  Component Value Date   NA 128 (L) 10/21/2024   K 3.9 10/21/2024   CL 99 10/21/2024   CO2 20 (L) 10/21/2024   BUN 48 (H) 10/21/2024   CREATININE 2.45 (H) 10/21/2024   GLUCOSE 90 10/21/2024   GFRNONAA 22 (L) 10/21/2024   GFRAA 97 12/21/2019   CALCIUM  8.3 (L) 10/21/2024   PHOS 4.2 10/12/2024   PROT 5.8 (L) 10/21/2024   ALBUMIN 2.7 (L) 10/21/2024   LABGLOB 3.6 12/21/2019   AGRATIO 1.1 (L) 12/21/2019   BILITOT 1.2 10/21/2024   ALKPHOS 140 (H) 10/21/2024   AST 39 10/21/2024   ALT 18 10/21/2024   ANIONGAP 9 10/21/2024    GFR: Estimated Creatinine Clearance:  26.9 mL/min (A) (by C-G formula based on SCr of 2.45 mg/dL (H)).  No results found for this or any previous visit (from the past 240 hours).    Radiology Studies: No results found.    LOS: 11 days    Elgin Lam, MD [facility] 10/21/2024, 7:48 AM   If 7PM-7AM, please contact night-coverage [email or web address]  "

## 2024-10-22 ENCOUNTER — Telehealth: Payer: Self-pay | Admitting: *Deleted

## 2024-10-22 ENCOUNTER — Other Ambulatory Visit (HOSPITAL_COMMUNITY): Payer: Self-pay

## 2024-10-22 DIAGNOSIS — R188 Other ascites: Secondary | ICD-10-CM

## 2024-10-22 DIAGNOSIS — K7682 Hepatic encephalopathy: Secondary | ICD-10-CM

## 2024-10-22 DIAGNOSIS — I864 Gastric varices: Secondary | ICD-10-CM

## 2024-10-22 DIAGNOSIS — K922 Gastrointestinal hemorrhage, unspecified: Secondary | ICD-10-CM | POA: Diagnosis not present

## 2024-10-22 DIAGNOSIS — K7460 Unspecified cirrhosis of liver: Secondary | ICD-10-CM

## 2024-10-22 DIAGNOSIS — K920 Hematemesis: Secondary | ICD-10-CM

## 2024-10-22 LAB — GLUCOSE, CAPILLARY: Glucose-Capillary: 103 mg/dL — ABNORMAL HIGH (ref 70–99)

## 2024-10-22 MED ORDER — FUROSEMIDE 20 MG PO TABS
20.0000 mg | ORAL_TABLET | Freq: Every day | ORAL | 2 refills | Status: AC
  Filled 2024-10-22: qty 30, 30d supply, fill #0

## 2024-10-22 MED ORDER — SODIUM BICARBONATE 650 MG PO TABS
650.0000 mg | ORAL_TABLET | Freq: Two times a day (BID) | ORAL | 0 refills | Status: AC
  Filled 2024-10-22: qty 10, 5d supply, fill #0

## 2024-10-22 MED ORDER — PANTOPRAZOLE SODIUM 40 MG PO TBEC
40.0000 mg | DELAYED_RELEASE_TABLET | Freq: Every day | ORAL | 2 refills | Status: AC
  Filled 2024-10-22: qty 30, 30d supply, fill #0

## 2024-10-22 MED ORDER — MIDODRINE HCL 10 MG PO TABS
10.0000 mg | ORAL_TABLET | Freq: Three times a day (TID) | ORAL | 2 refills | Status: AC
  Filled 2024-10-22 (×2): qty 90, 30d supply, fill #0

## 2024-10-22 MED ORDER — OXYCODONE HCL 5 MG PO TABS
5.0000 mg | ORAL_TABLET | Freq: Four times a day (QID) | ORAL | 0 refills | Status: AC | PRN
  Filled 2024-10-22: qty 12, 3d supply, fill #0

## 2024-10-22 MED ORDER — LACTULOSE 10 GM/15ML PO SOLN
20.0000 g | Freq: Three times a day (TID) | ORAL | 0 refills | Status: AC
  Filled 2024-10-22: qty 1350, 15d supply, fill #0

## 2024-10-22 MED ORDER — ZINC SULFATE 220 (50 ZN) MG PO TABS
220.0000 mg | ORAL_TABLET | Freq: Every day | ORAL | 0 refills | Status: AC
  Filled 2024-10-22: qty 16, 16d supply, fill #0

## 2024-10-22 MED ORDER — ZINC SULFATE 220 (50 ZN) MG PO TABS
220.0000 mg | ORAL_TABLET | Freq: Two times a day (BID) | ORAL | 2 refills | Status: DC
  Filled 2024-10-22: qty 30, 15d supply, fill #0

## 2024-10-22 MED ORDER — LACTULOSE 10 GM/15ML PO SOLN
10.0000 g | Freq: Three times a day (TID) | ORAL | 0 refills | Status: DC
  Filled 2024-10-22: qty 1350, 30d supply, fill #0

## 2024-10-22 NOTE — Discharge Instructions (Signed)
 Susan [name],  You were in the hospital because of GI bleeding and worsening liver disease. Your GI bleeding was from bleeding blood vessels in your esophagus, related to your liver disease. You also needed a procedure to help with the risk of bleeding in the future and to help with build up of fluid in your abdomen (ascites). Please review your revised medication list and follow-up with the physicians as recommended. It is also important that you obtain repeat lab work within one week to make sure your labs are stable (liver, sodium and kidney labs).

## 2024-10-22 NOTE — Plan of Care (Signed)
   Problem: Education: Goal: Ability to describe self-care measures that may prevent or decrease complications (Diabetes Survival Skills Education) will improve Outcome: Progressing

## 2024-10-22 NOTE — Progress Notes (Signed)
 1036am.   TOC meds delivered at bedside by discharge RN.

## 2024-10-22 NOTE — Telephone Encounter (Signed)
 Labs entered in EPIC to be completed before 11/13/24 appointment.  Patient is scheduled to see Nestor Blower, PA-C on 11/13/24 at 9:20 am.  Will coordinate visit with [facility].

## 2024-10-22 NOTE — Telephone Encounter (Signed)
 Previous Messages  ----- Message ----- From: Wilhelmenia Aloha Raddle., MD Sent: 10/22/2024   5:09 AM EST To: Greig GORMAN Corti, PA-C; Bari JONETTA Lesches; Ste* Subject: Follow-up                                       Christy, This patient needs follow-up with Dr. Leigh or one of the APP's in the next 2 to 3 weeks. She will likely be discharged within the next few days or into the weekend.   Pod A nurses, This patient needs follow-up CBC/CMP/INR to be drawn prior to the clinic visit. We also need to make sure we have expedited follow-up with [facility] (she already sees [facility]). Thanks. GM  ------------------------------------------------------- ----- Message ----- From: Leigh Elspeth SQUIBB, MD Sent: 10/21/2024   5:05 PM EST To: Greig GORMAN Esterwood, PA-C; [facility] Pod A Triage Subject: RE: labs and office follow up with Stephane Fee*   Amy are you requesting follow up with us  for this or Dawn / [facility]? I think probably best if she follows up with [facility], if they can get her in. If not we can coordinate with us .    ----- Message ----- From: Corti Greig GORMAN DEVONNA Sent: 10/21/2024   4:58 PM EST To: Elspeth SQUIBB Leigh, MD; [facility] Pod A Triage Subject: labs and office follow up with Stephane Hays SAILOR*   Patient currently admitted with decompensated cirrhosis, acute gastric variceal hemorrhage status post TIPS and BRTO-course complicated by TIPS occlusion requiring revision-still has high portal pressures Hepatic encephalopathy Acute kidney injury Anemia   Underlying PBC/cirrhosis   She is known to [facility]-Colleen had spoken to them earlier this week, needs a very close follow-up within 10 to 14 days discharge-please arrange-will need to call pt and make sure she is aware of appt   If she is unable to be seen in that timeframe then she will need CBC with differential, pro time/INR, venous ammonia and CMET in 1 week, under Armbruster's name   Thank you

## 2024-10-22 NOTE — TOC Progression Note (Signed)
 Transition of Care (TOC) - Progression Note   DME delivered to room last week . Burnard with [facility] notified of discharge today   No further Inpatient Care Management Team needs  Patient Details  Name: Susan [name] MRN: [id number] Date of Birth: 1965-10-24  Transition of Care [facility]) CM/SW Contact  Makailah Slavick, Powell Jansky, RN Phone Number: 10/22/2024, 9:59 AM  Clinical Narrative:       Expected Discharge Plan: Home w Home Health Services Barriers to Discharge: Continued Medical Work up               Expected Discharge Plan and Services In-house Referral: NA Discharge Planning Services: CM Consult Post Acute Care Choice: Home Health, Durable Medical Equipment Living arrangements for the past 2 months: Single Family Home Expected Discharge Date: 10/22/24               DME Arranged: BOB Finder rolling DME Agency: [facility] Date DME Agency Contacted: 10/13/24 Time DME Agency Contacted: [phone] Representative spoke with at DME Agency: [facility] HH Arranged: PT, OT, RN [facility] Agency: [facility] Date [facility] Agency Contacted: 10/13/24 Time HH Agency Contacted: [phone] Representative spoke with at [facility] Agency: Burnard   Social Drivers of Health (SDOH) Interventions SDOH Screenings   Food Insecurity: No Food Insecurity (10/10/2024)  Housing: Low Risk (10/10/2024)  Transportation Needs: No Transportation Needs (10/10/2024)  Utilities: Not At Risk (10/10/2024)  Alcohol Screen: Low Risk (05/07/2023)  Depression (PHQ2-9): Medium Risk (08/17/2024)  Financial Resource Strain: Low Risk (08/17/2024)  Physical Activity: Insufficiently Active (08/17/2024)  Social Connections: Moderately Integrated (08/17/2024)  Stress: No Stress Concern Present (08/17/2024)  Tobacco Use: Low Risk (10/10/2024)    Readmission Risk Interventions     No data to display

## 2024-10-23 ENCOUNTER — Telehealth: Payer: Self-pay | Admitting: Family

## 2024-10-23 ENCOUNTER — Telehealth: Payer: Self-pay | Admitting: *Deleted

## 2024-10-23 NOTE — Telephone Encounter (Signed)
 Contacted [facility]. Patient is already scheduled for follow up with [name], NP on 10/28/24.

## 2024-10-23 NOTE — Discharge Summary (Signed)
 " Physician Discharge Summary   Patient: Susan [name] MRN: [id number] DOB: 1966-05-14  Admit date:     10/09/2024  Discharge date: 10/22/2024  Discharge Physician: Elgin Lam, MD   PCP: Corwin Antu, FNP   Recommendations at discharge:  PCP visit for hospital follow-up GI visit for hospital follow-up Nephrology visit for hospital follow-up  Discharge Diagnoses: Principal Problem:   Upper GI bleed Active Problems:   Encephalopathy, hepatic (HCC)   Gastric varix   Hepatic cirrhosis due to primary biliary cholangitis (HCC)   Hematemesis with nausea   Portal hypertension (HCC)   S/P TIPS (transjugular intrahepatic portosystemic shunt)   Lower abdominal pain   Portosystemic encephalopathy (HCC)   Gastric varices   Acute renal insufficiency   Bleeding gastric varices   Decompensated cirrhosis (HCC)   Other ascites  Resolved Problems:   * No resolved hospital problems. *  Hospital Course: Susan [name] is a 59 y.o. female with a history of primary biliary cholangitis with cirrhosis, hepatic encephalopathy, hypertension, hyperlipidemia, obesity.  Patient presented secondary to hematemesis with concern for upper GI bleeding. Patient was intubated for airway protection and admitted to ICU. GI consulted and patient underwent upper GI endoscopy revealing evidence of recent upper GI bleeding. IR consulted for TIPS creation which was complicated by need for TIPS thrombectomy. Hepatic encephalopathy improved with treatment. AKI improved with supportive care and time.  Assessment and Plan:  Hemorrhagic shock Acute blood loss anemia Present on admission secondary to upper GI bleeding. Patient required ICU admission and vasopressor support. Patient required a total of 2 units of PRBC via transfusion. Resolved.   Upper GI bleeding Variceal bleeding Hepatic encephalopathy Patient started on octreotide  drip and Protonix  IV. Gastroenterology consulted and performed an upper GI  endoscopy on 1/17 revealing clotted blood in the cardia, gastric fundus and gastric body (partially removed) in addition to type 1 isolated gastric varices without active bleeding but with stigmata of recent bleed. IR consulted for TIPS creation which was performed on 1/17; also performed at that time were coil embolization of posterior and left gastric veins in addition to coil assisted retrograde transvenous obliteration of gastrorenal shunt. CT BRTO performed on 1/21 was significant for occlusive thrombosis of the main portal vein and TIPS shunt, prompting need for TIPS thrombectomy and revision. Encephalopathy improved with rifaximin  and lactulose . Patient's insurance does not cover rifaximin , unfortunately. Patient discharged on lactulose  20 mg TID.   Primary biliary cholangitis with cirrhosis Decompensated cirrhosis with ascites Patient does not have a history of alcoholic cirrhosis. Patient follows with GI. Plan for patient to follow-up with [facility] for outpatient consideration of transplant. Patient underwent TIPS this admission. Patient also underwent paracentesis, yielding 600 mL of fluid. Patient started on midodrine .   Hypotension Related to liver disease. Requiring midodrine . Continue midodrine  on discharge.   AKI Baseline creatinine is about 0.9. Presumed contrast-induced rather than related to hepatorenal disease. Peak creatinine of 2.70. Nephrology consulted. Renal function monitored with improvement to 2.45 prior to discharge. Nephrology with recommendation for outpatient follow-up.   Acute metabolic acidosis Secondary to AKI. Improved.   Hypoalbuminemia Secondary to cirrhosis. Recommendation to improve protein intake.   Hypokalemia Resolved.   Hypocalcemia Appears to be pseudohypocalcemia secondary to low albumin.   Hyponatremia Chronic and stable. Sodium of 128 prior to discharge. Recommend repeat metabolic panel within 3-5 days.   Leukocytosis Resolved.    Thrombocytopenia Resolved.   Acute respiratory failure with hypoxia Patient required intubation on admission. She was successfully extubated  on 1/18.   Diabetes mellitus type 2 Well controlled based on last hemoglobin A1C of 5.4%.   Obesity, class III Estimated body mass index is 43.01 kg/m as calculated from the following:   Height as of this encounter: 5' (1.524 m).   Weight as of this encounter: 99.9 kg.   Consultants:  PCCM Gastroenterology Interventional radiology   Procedures:  Intubation/extubation Interventional radiology procedure TIPS creation Coil embolization of posterior and left gastric veins Coil assisted retrograde transvenous obliteration of gastrorenal shunt Interventional radiology procedure transjugular intrahepatic portosystemic shunt (tips) thrombectomy and revision therapeutic paracentesis  Disposition: Home Diet recommendation: Low sodium diet   DISCHARGE MEDICATION: Allergies as of 10/22/2024       Reactions   Mushroom Extract Complex (obsolete) Anaphylaxis   Throat swells   Grapefruit Concentrate Other (See Comments)   blisters   Mounjaro  [tirzepatide ] Other (See Comments)   Hepatic encephalopathy   Sulfa Antibiotics Rash   Severe dehydration   Amlodipine Other (See Comments)   Heart rate fast   Crestor  [rosuvastatin ]    Brain fog, fatigue, felt 'terrible'.    Shellfish Allergy Hives      Simvastatin     Brain fog, fatigue, felt 'terrible'.    Strawberry Extract    hives   Metformin  And Related Diarrhea   Weakness        Medication List     STOP taking these medications    carvedilol  3.125 MG tablet Commonly known as: COREG    hydrochlorothiazide  25 MG tablet Commonly known as: HYDRODIURIL    spironolactone  50 MG tablet Commonly known as: ALDACTONE        TAKE these medications    Constulose  10 GM/15ML solution Generic drug: lactulose  Take 30 mLs (20 g total) by mouth 3 (three) times daily. What changed:  how  much to take when to take this   furosemide  20 MG tablet Commonly known as: LASIX  Take 1 tablet (20 mg total) by mouth daily.   midodrine  10 MG tablet Commonly known as: PROAMATINE  Take 1 tablet (10 mg total) by mouth 3 (three) times daily with meals.   oxyCODONE  5 MG immediate release tablet Commonly known as: Oxy IR/ROXICODONE  Take 1 tablet (5 mg total) by mouth every 6 (six) hours as needed for severe pain (pain score 7-10).   pantoprazole  40 MG tablet Commonly known as: PROTONIX  Take 1 tablet (40 mg total) by mouth daily.   sodium bicarbonate  650 MG tablet Take 1 tablet (650 mg total) by mouth 2 (two) times daily for 5 days.   ursodiol  300 MG capsule Commonly known as: ACTIGALL  TAKE 2 CAPSULES BY MOUTH TWICE  DAILY   Zinc  Sulfate 220 (50 Zn) MG Tabs Take 1 tablet (220 mg total) by mouth daily.        Contact information for follow-up providers     Corwin Antu, FNP Follow up.   Specialty: Family Medicine Contact information: [address] [place] [address] [phone]         [facility] [employer] (DME) Follow up.   Specialty: DME Services Why: company bedside commode and rolling walker were ordered through Contact information: [address] [place]  [address] [phone]        Hays Morrison, CRNP. Schedule an appointment as soon as possible for a visit.   Specialty: Nurse Practitioner Why: Labs, For hospital follow-up Contact information: [address] [place] [address] [phone]         Dolan Mateo Larger,  MD. Schedule an appointment as soon as possible for a visit.   Specialties: Nephrology, Internal Medicine Why: For hospital follow-up Contact information: [address] [place] [address] [phone]              Contact information for after-discharge care     Home Medical Care     [facility]) .   Service: Home Health  Services Contact information: [address] [place]  [address] [phone]                    Discharge Exam: BP 106/68 (BP Location: Right Arm)   Pulse 75   Temp 97.9 F (36.6 C) (Oral)   Resp 18   Ht 5' (1.524 m)   Wt 99.9 kg   SpO2 100%   BMI 43.01 kg/m   General exam: Appears calm and comfortable. Respiratory system: Clear to auscultation. Respiratory effort normal. Cardiovascular system: S1 & S2 heard, RRR. Systolic murmur. Gastrointestinal system: Abdomen is distended, soft and nontender. Normal bowel sounds heard. Central nervous system: Alert and oriented. No focal neurological deficits. Musculoskeletal: BLE pitting edema. No calf tenderness Psychiatry: Judgement and insight appear normal. Mood & affect appropriate.    Condition at discharge: stable  The results of significant diagnostics from this hospitalization (including imaging, microbiology, ancillary and laboratory) are listed below for reference.   Imaging Studies: IR TIPS REVISION MOD SED Result Date: 10/16/2024 CLINICAL DATA:  TIPS revision Briefly, 59 year old female with a history of primary biliary cirrhosis with gastric varices and gastro renal shunt who presented with gastric variceal hemorrhage, now s/p TIPS and BRTO (10/10/2024). Acute abdominal pain with repeat CTA (10/14/2024) demonstrating TIPS occlusion. EXAM: Procedures: 1. PORTAL and HEPATIC VENOGRAPHY 2. TRANSJUGULAR INTRAHEPATIC PORTOSYSTEMIC SHUNT (TIPS) THROMBECTOMY and REVISION 3. THERAPEUTIC PARACENTESIS MEDICATIONS: 50 mg Benadryl  IV. ANESTHESIA/SEDATION: Moderate (conscious) sedation was employed during this procedure. A total of Versed  3.5 mg and Fentanyl  125 mcg was administered intravenously. Moderate Sedation Time: 106 minutes. The patient's level of consciousness and vital signs were monitored continuously by radiology nursing throughout the procedure under my direct supervision. CONTRAST:  90  mL OMNIPAQUE  IOHEXOL  300 MG/ML  SOLN FLUOROSCOPY: Radiation Exposure Index and estimated peak skin dose (PSD); Reference air kerma (RAK), 742.3 mGy. COMPLICATIONS: None immediate. PROCEDURE: Informed written consent was obtained from the patient and/or patient's representative after a thorough discussion of the procedural risks, benefits and alternatives. All questions were addressed. Maximal Sterile Barrier Technique was utilized including caps, mask, sterile gowns, sterile gloves, sterile drape, hand hygiene and skin antiseptic. The skin overlying the RIGHT upper abdominal quadrant as well as the RIGHT neck were prepped and draped in usual sterile fashion. A timeout was performed prior to the initiation of the procedure. PARACENTESIS; Initial ultrasound scanning demonstrates a moderate volume of ascites within the abdomen. Under direct ultrasound guidance, a 7 cm Yueh catheter was introduced. An ultrasound image was saved for documentation purposes. Therapeutic paracentesis was performed. PORTAL VENOGRAPHY; 1% Lidocaine  was used for local anesthesia. Ultrasound evaluation showed a patent RIGHT internal jugular vein. Ultrasound image was saved and sent to PACS. Under ultrasound guidance, the right internal jugular vein was access using a 21-G needle which was dilated and exchanged for a 16 Fr sheath over a guidewire. Under fluoroscopic guidance, utilizing a 5 Fr C2 catheter and 0.035-inch Bentson guidewire manipulation, the catheter was advanced through the TIPS shunt and into the main portal vein. Digital subtraction venography was performed.  The TIPS shunt was occluded and no significant flow through the shunt was identified. Pressure measurements were performed in the portal vein and in the RIGHT atrium. Over an 0.035 inch Rosen wire, a 12 Fr Penumbra Lightning Flash aspiration catheter was advanced into portal venous confluence and catheter directed thrombectomy was performed. Intermittent and postprocedural  portal venograms were performed during thrombus removal. Balloon venoplasty of the TIPS was performed with an 8 mm athletis balloon, dilated to nominal pressure. Completion portal venogram demonstrated a successful dilation of the stent. Post dilatation pressure measurements were obtained following the revision in a similar fashion. Following adequate venographic result, the catheter and sheath were removed and the procedure was terminated. All wires, catheters and sheaths were removed from the patient. Hemostasis was achieved at the RIGHT neck and RIGHT upper abdominal access sites with manual compression. Dressings were applied. The patient tolerated the procedure well without immediate postprocedural complication. FINDINGS: *TIPS thrombosis, without significant extension to the portal vein. *TIPS mechanical thrombectomy with 12 Fr Penumbra lightning aspiration catheter, and revision venoplasty with 8 mm balloon was performed. *No residual opacification of the previously-embolized gastric varices. *Predominant hepatofugal flow, with preferential flow to the SMV. *Minimal reduction of portosystemic gradient from 30 to 29 mm Hg. *Moderate volume intra-abdominal ascites. Ultrasound-guided paracentesis was performed yielding 600 mL of serosanguineous ascitic fluid. Pre-TIPS revision mean Pressures (mmHg): Right atrium: 0 Portal vein: 30 Portosystemic gradient: 30 Post-TIPS revision mean Pressures (mmHg): Right atrium: 0 Portal vein: 29 Portosystemic gradient: 29 IMPRESSION: 1. Mechanical thrombectomy and revision venoplasty of transjugular intrahepatic portosystemic shunt (TIPS), secondary to thrombosis. No significant extension to the portal vein. 2. Successful ultrasound-guided paracentesis yielding 600 mL of ascitic fluid. 3. Predominant hepatofugal flow, with concern for prolonged TIPS patency. Recommend anticoagulation to avoid re-thrombosis. Thom Hall, MD [facility]  [facility] Radiology Electronically Signed   By: Thom Hall M.D.   On: 10/16/2024 10:17   IR US  Guide Vasc Access Right Result Date: 10/16/2024 CLINICAL DATA:  TIPS revision Briefly, 59 year old female with a history of primary biliary cirrhosis with gastric varices and gastro renal shunt who presented with gastric variceal hemorrhage, now s/p TIPS and BRTO (10/10/2024). Acute abdominal pain with repeat CTA (10/14/2024) demonstrating TIPS occlusion. EXAM: Procedures: 1. PORTAL and HEPATIC VENOGRAPHY 2. TRANSJUGULAR INTRAHEPATIC PORTOSYSTEMIC SHUNT (TIPS) THROMBECTOMY and REVISION 3. THERAPEUTIC PARACENTESIS MEDICATIONS: 50 mg Benadryl  IV. ANESTHESIA/SEDATION: Moderate (conscious) sedation was employed during this procedure. A total of Versed  3.5 mg and Fentanyl  125 mcg was administered intravenously. Moderate Sedation Time: 106 minutes. The patient's level of consciousness and vital signs were monitored continuously by radiology nursing throughout the procedure under my direct supervision. CONTRAST:  90 mL OMNIPAQUE  IOHEXOL  300 MG/ML  SOLN FLUOROSCOPY: Radiation Exposure Index and estimated peak skin dose (PSD); Reference air kerma (RAK), 742.3 mGy. COMPLICATIONS: None immediate. PROCEDURE: Informed written consent was obtained from the patient and/or patient's representative after a thorough discussion of the procedural risks, benefits and alternatives. All questions were addressed. Maximal Sterile Barrier Technique was utilized including caps, mask, sterile gowns, sterile gloves, sterile drape, hand hygiene and skin antiseptic. The skin overlying the RIGHT upper abdominal quadrant as well as the RIGHT neck were prepped and draped in usual sterile fashion. A timeout was performed prior to the initiation of the procedure. PARACENTESIS; Initial ultrasound scanning demonstrates a moderate volume of ascites within the abdomen. Under direct ultrasound guidance, a 7 cm Yueh catheter was introduced. An ultrasound  image was saved for documentation purposes.  Therapeutic paracentesis was performed. PORTAL VENOGRAPHY; 1% Lidocaine  was used for local anesthesia. Ultrasound evaluation showed a patent RIGHT internal jugular vein. Ultrasound image was saved and sent to PACS. Under ultrasound guidance, the right internal jugular vein was access using a 21-G needle which was dilated and exchanged for a 16 Fr sheath over a guidewire. Under fluoroscopic guidance, utilizing a 5 Fr C2 catheter and 0.035-inch Bentson guidewire manipulation, the catheter was advanced through the TIPS shunt and into the main portal vein. Digital subtraction venography was performed. The TIPS shunt was occluded and no significant flow through the shunt was identified. Pressure measurements were performed in the portal vein and in the RIGHT atrium. Over an 0.035 inch Rosen wire, a 12 Fr Penumbra Lightning Flash aspiration catheter was advanced into portal venous confluence and catheter directed thrombectomy was performed. Intermittent and postprocedural portal venograms were performed during thrombus removal. Balloon venoplasty of the TIPS was performed with an 8 mm athletis balloon, dilated to nominal pressure. Completion portal venogram demonstrated a successful dilation of the stent. Post dilatation pressure measurements were obtained following the revision in a similar fashion. Following adequate venographic result, the catheter and sheath were removed and the procedure was terminated. All wires, catheters and sheaths were removed from the patient. Hemostasis was achieved at the RIGHT neck and RIGHT upper abdominal access sites with manual compression. Dressings were applied. The patient tolerated the procedure well without immediate postprocedural complication. FINDINGS: *TIPS thrombosis, without significant extension to the portal vein. *TIPS mechanical thrombectomy with 12 Fr Penumbra lightning aspiration catheter, and revision venoplasty with 8 mm  balloon was performed. *No residual opacification of the previously-embolized gastric varices. *Predominant hepatofugal flow, with preferential flow to the SMV. *Minimal reduction of portosystemic gradient from 30 to 29 mm Hg. *Moderate volume intra-abdominal ascites. Ultrasound-guided paracentesis was performed yielding 600 mL of serosanguineous ascitic fluid. Pre-TIPS revision mean Pressures (mmHg): Right atrium: 0 Portal vein: 30 Portosystemic gradient: 30 Post-TIPS revision mean Pressures (mmHg): Right atrium: 0 Portal vein: 29 Portosystemic gradient: 29 IMPRESSION: 1. Mechanical thrombectomy and revision venoplasty of transjugular intrahepatic portosystemic shunt (TIPS), secondary to thrombosis. No significant extension to the portal vein. 2. Successful ultrasound-guided paracentesis yielding 600 mL of ascitic fluid. 3. Predominant hepatofugal flow, with concern for prolonged TIPS patency. Recommend anticoagulation to avoid re-thrombosis. Thom Hall, MD [facility] Radiology Electronically Signed   By: Thom Hall M.D.   On: 10/16/2024 10:17   IR Paracentesis Result Date: 10/16/2024 CLINICAL DATA:  TIPS revision Briefly, 59 year old female with a history of primary biliary cirrhosis with gastric varices and gastro renal shunt who presented with gastric variceal hemorrhage, now s/p TIPS and BRTO (10/10/2024). Acute abdominal pain with repeat CTA (10/14/2024) demonstrating TIPS occlusion. EXAM: Procedures: 1. PORTAL and HEPATIC VENOGRAPHY 2. TRANSJUGULAR INTRAHEPATIC PORTOSYSTEMIC SHUNT (TIPS) THROMBECTOMY and REVISION 3. THERAPEUTIC PARACENTESIS MEDICATIONS: 50 mg Benadryl  IV. ANESTHESIA/SEDATION: Moderate (conscious) sedation was employed during this procedure. A total of Versed  3.5 mg and Fentanyl  125 mcg was administered intravenously. Moderate Sedation Time: 106 minutes. The patient's level of consciousness and vital signs were monitored continuously by  radiology nursing throughout the procedure under my direct supervision. CONTRAST:  90 mL OMNIPAQUE  IOHEXOL  300 MG/ML  SOLN FLUOROSCOPY: Radiation Exposure Index and estimated peak skin dose (PSD); Reference air kerma (RAK), 742.3 mGy. COMPLICATIONS: None immediate. PROCEDURE: Informed written consent was obtained from the patient and/or patient's representative after a thorough discussion of the procedural risks, benefits and alternatives. All questions were addressed.  Maximal Sterile Barrier Technique was utilized including caps, mask, sterile gowns, sterile gloves, sterile drape, hand hygiene and skin antiseptic. The skin overlying the RIGHT upper abdominal quadrant as well as the RIGHT neck were prepped and draped in usual sterile fashion. A timeout was performed prior to the initiation of the procedure. PARACENTESIS; Initial ultrasound scanning demonstrates a moderate volume of ascites within the abdomen. Under direct ultrasound guidance, a 7 cm Yueh catheter was introduced. An ultrasound image was saved for documentation purposes. Therapeutic paracentesis was performed. PORTAL VENOGRAPHY; 1% Lidocaine  was used for local anesthesia. Ultrasound evaluation showed a patent RIGHT internal jugular vein. Ultrasound image was saved and sent to PACS. Under ultrasound guidance, the right internal jugular vein was access using a 21-G needle which was dilated and exchanged for a 16 Fr sheath over a guidewire. Under fluoroscopic guidance, utilizing a 5 Fr C2 catheter and 0.035-inch Bentson guidewire manipulation, the catheter was advanced through the TIPS shunt and into the main portal vein. Digital subtraction venography was performed. The TIPS shunt was occluded and no significant flow through the shunt was identified. Pressure measurements were performed in the portal vein and in the RIGHT atrium. Over an 0.035 inch Rosen wire, a 12 Fr Penumbra Lightning Flash aspiration catheter was advanced into portal venous  confluence and catheter directed thrombectomy was performed. Intermittent and postprocedural portal venograms were performed during thrombus removal. Balloon venoplasty of the TIPS was performed with an 8 mm athletis balloon, dilated to nominal pressure. Completion portal venogram demonstrated a successful dilation of the stent. Post dilatation pressure measurements were obtained following the revision in a similar fashion. Following adequate venographic result, the catheter and sheath were removed and the procedure was terminated. All wires, catheters and sheaths were removed from the patient. Hemostasis was achieved at the RIGHT neck and RIGHT upper abdominal access sites with manual compression. Dressings were applied. The patient tolerated the procedure well without immediate postprocedural complication. FINDINGS: *TIPS thrombosis, without significant extension to the portal vein. *TIPS mechanical thrombectomy with 12 Fr Penumbra lightning aspiration catheter, and revision venoplasty with 8 mm balloon was performed. *No residual opacification of the previously-embolized gastric varices. *Predominant hepatofugal flow, with preferential flow to the SMV. *Minimal reduction of portosystemic gradient from 30 to 29 mm Hg. *Moderate volume intra-abdominal ascites. Ultrasound-guided paracentesis was performed yielding 600 mL of serosanguineous ascitic fluid. Pre-TIPS revision mean Pressures (mmHg): Right atrium: 0 Portal vein: 30 Portosystemic gradient: 30 Post-TIPS revision mean Pressures (mmHg): Right atrium: 0 Portal vein: 29 Portosystemic gradient: 29 IMPRESSION: 1. Mechanical thrombectomy and revision venoplasty of transjugular intrahepatic portosystemic shunt (TIPS), secondary to thrombosis. No significant extension to the portal vein. 2. Successful ultrasound-guided paracentesis yielding 600 mL of ascitic fluid. 3. Predominant hepatofugal flow, with concern for prolonged TIPS patency. Recommend anticoagulation to  avoid re-thrombosis. Thom Hall, MD [facility] Radiology Electronically Signed   By: Thom Hall M.D.   On: 10/16/2024 10:17   IR THROMBECT VENO MECH MOD SED Result Date: 10/16/2024 CLINICAL DATA:  TIPS revision Briefly, 60 year old female with a history of primary biliary cirrhosis with gastric varices and gastro renal shunt who presented with gastric variceal hemorrhage, now s/p TIPS and BRTO (10/10/2024). Acute abdominal pain with repeat CTA (10/14/2024) demonstrating TIPS occlusion. EXAM: Procedures: 1. PORTAL and HEPATIC VENOGRAPHY 2. TRANSJUGULAR INTRAHEPATIC PORTOSYSTEMIC SHUNT (TIPS) THROMBECTOMY and REVISION 3. THERAPEUTIC PARACENTESIS MEDICATIONS: 50 mg Benadryl  IV. ANESTHESIA/SEDATION: Moderate (conscious) sedation was employed during this procedure. A total of Versed  3.5 mg  and Fentanyl  125 mcg was administered intravenously. Moderate Sedation Time: 106 minutes. The patient's level of consciousness and vital signs were monitored continuously by radiology nursing throughout the procedure under my direct supervision. CONTRAST:  90 mL OMNIPAQUE  IOHEXOL  300 MG/ML  SOLN FLUOROSCOPY: Radiation Exposure Index and estimated peak skin dose (PSD); Reference air kerma (RAK), 742.3 mGy. COMPLICATIONS: None immediate. PROCEDURE: Informed written consent was obtained from the patient and/or patient's representative after a thorough discussion of the procedural risks, benefits and alternatives. All questions were addressed. Maximal Sterile Barrier Technique was utilized including caps, mask, sterile gowns, sterile gloves, sterile drape, hand hygiene and skin antiseptic. The skin overlying the RIGHT upper abdominal quadrant as well as the RIGHT neck were prepped and draped in usual sterile fashion. A timeout was performed prior to the initiation of the procedure. PARACENTESIS; Initial ultrasound scanning demonstrates a moderate volume of ascites within the abdomen.  Under direct ultrasound guidance, a 7 cm Yueh catheter was introduced. An ultrasound image was saved for documentation purposes. Therapeutic paracentesis was performed. PORTAL VENOGRAPHY; 1% Lidocaine  was used for local anesthesia. Ultrasound evaluation showed a patent RIGHT internal jugular vein. Ultrasound image was saved and sent to PACS. Under ultrasound guidance, the right internal jugular vein was access using a 21-G needle which was dilated and exchanged for a 16 Fr sheath over a guidewire. Under fluoroscopic guidance, utilizing a 5 Fr C2 catheter and 0.035-inch Bentson guidewire manipulation, the catheter was advanced through the TIPS shunt and into the main portal vein. Digital subtraction venography was performed. The TIPS shunt was occluded and no significant flow through the shunt was identified. Pressure measurements were performed in the portal vein and in the RIGHT atrium. Over an 0.035 inch Rosen wire, a 12 Fr Penumbra Lightning Flash aspiration catheter was advanced into portal venous confluence and catheter directed thrombectomy was performed. Intermittent and postprocedural portal venograms were performed during thrombus removal. Balloon venoplasty of the TIPS was performed with an 8 mm athletis balloon, dilated to nominal pressure. Completion portal venogram demonstrated a successful dilation of the stent. Post dilatation pressure measurements were obtained following the revision in a similar fashion. Following adequate venographic result, the catheter and sheath were removed and the procedure was terminated. All wires, catheters and sheaths were removed from the patient. Hemostasis was achieved at the RIGHT neck and RIGHT upper abdominal access sites with manual compression. Dressings were applied. The patient tolerated the procedure well without immediate postprocedural complication. FINDINGS: *TIPS thrombosis, without significant extension to the portal vein. *TIPS mechanical thrombectomy with  12 Fr Penumbra lightning aspiration catheter, and revision venoplasty with 8 mm balloon was performed. *No residual opacification of the previously-embolized gastric varices. *Predominant hepatofugal flow, with preferential flow to the SMV. *Minimal reduction of portosystemic gradient from 30 to 29 mm Hg. *Moderate volume intra-abdominal ascites. Ultrasound-guided paracentesis was performed yielding 600 mL of serosanguineous ascitic fluid. Pre-TIPS revision mean Pressures (mmHg): Right atrium: 0 Portal vein: 30 Portosystemic gradient: 30 Post-TIPS revision mean Pressures (mmHg): Right atrium: 0 Portal vein: 29 Portosystemic gradient: 29 IMPRESSION: 1. Mechanical thrombectomy and revision venoplasty of transjugular intrahepatic portosystemic shunt (TIPS), secondary to thrombosis. No significant extension to the portal vein. 2. Successful ultrasound-guided paracentesis yielding 600 mL of ascitic fluid. 3. Predominant hepatofugal flow, with concern for prolonged TIPS patency. Recommend anticoagulation to avoid re-thrombosis. Thom Hall, MD [facility] Radiology Electronically Signed   By: Thom Hall M.D.   On: 10/16/2024 10:17   CT ANGIO ABD/PELVIS  BRTO Result Date: 10/14/2024 EXAM: CTA ABDOMEN AND PELVIS WITH CONTRAST 10/14/2024 03:49:00 PM TECHNIQUE: CTA images of the abdomen and pelvis with intravenous contrast. Three-dimensional MIP/volume rendered formations were performed. Automated exposure control, iterative reconstruction, and/or weight based adjustment of the mA/kV was utilized to reduce the radiation dose to as low as reasonably achievable. COMPARISON: 1 17 26  CLINICAL HISTORY: s/p TIPS/BRTO, drop in hgb FINDINGS: Blood pool is hypodense compared to the interventricular septum suggesting anemia. VASCULATURE: AORTA: No acute finding. No abdominal aortic aneurysm. No dissection. CELIAC TRUNK: No acute finding. No occlusion or significant stenosis. SUPERIOR  MESENTERIC ARTERY: No acute finding. No occlusion or significant stenosis. RENAL ARTERIES: No acute finding. No occlusion or significant stenosis. INFERIOR MESENTERIC ARTERY: Focal calcified plaque of the origin of the inferior mesenteric artery which is patent distally. ILIAC ARTERIES: Mild scattered calcified plaque in bilateral internal iliac arteries. No occlusion or significant stenosis. 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 SUPERIOR MESENTERIC VEIN: Superior mesenteric vein patent. ILIAC VEINS, IVC, RENAL VEINS: Patent iliac venous system, IVC, and bilateral renal veins. TIPS STENT: No definite contrast enhancement through the TIPS stent. VARICES: Streak artifact from multiple embolization coils degrades evaluation of left upper quadrant varices. However, some visible varices near the GE junction remain patent. LIVER: Nodular hepatic contour. GALLBLADDER AND BILE DUCTS: Gallbladder is unremarkable. No biliary ductal dilatation. SPLEEN: The spleen is unremarkable. PANCREAS: The pancreas is unremarkable. ADRENAL GLANDS: Bilateral adrenal glands demonstrate no acute abnormality. KIDNEYS, URETERS AND BLADDER: No stones in the kidneys or ureters. No hydronephrosis. No perinephric or periureteral stranding. Urinary bladder is unremarkable. GI AND BOWEL: Stomach and duodenal sweep demonstrate no acute abnormality. There is no bowel obstruction. No abnormal bowel wall thickening or distension. REPRODUCTIVE: Reproductive organs are unremarkable. PERITONEUM AND RETROPERITONEUM: Small volume pelvic and perihepatic ascites, new since previous. No free air. LUNG BASE: No acute abnormality. LYMPH NODES: No lymphadenopathy. BONES AND SOFT TISSUES: Early degenerative disc disease L3-L4. No acute abnormality of the bones. No acute soft tissue abnormality. IMPRESSION: 1. 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  2. Small volume pelvic and perihepatic ascites, new since previous. 3. Streak artifact from multiple embolization coils limits evaluation of left upper quadrant varices; some visible varices near the GE junction remain patent. 4. Hypodense blood pool compared to the interventricular septum, suggesting anemia. Electronically signed by: Dayne Hassell MD 10/14/2024 04:24 PM EST RP Workstation: [id number]   IR Tips Result Date: 10/11/2024 CLINICAL DATA:  59 year old female with history of primary biliary cirrhosis complicated by gastric varices and gastro renal shunt presenting with gastric variceal hemorrhage. EXAM: 1. Ultrasound-guided access of the right internal jugular vein 2. Ultrasound-guided access of the right common femoral vein 3. Intravascular ultrasound 4. Catheterization of the portal vein 5. Portal venous and central manometry 6. Portal venogram 7. Creation of a transhepatic portal vein to hepatic vein shunt 8. Coil embolization of the left gastric and 2 posterior gastric veins 9. Coil assisted retrograde transvenous obliteration of gastro renal shunt MEDICATIONS: As antibiotic prophylaxis, Rocephin  1 gm IV was ordered pre-procedure and administered intravenously within one hour of incision. ANESTHESIA/SEDATION: General - as administered by the Anesthesia department CONTRAST:  Fifty ML Omnipaque  300, intravenous FLUOROSCOPY TIME:  Four hundred eighty mGy reference air kerma COMPLICATIONS: None immediate. PROCEDURE: Informed written consent was obtained from the patient after a thorough discussion of the procedural risks, benefits and alternatives. All questions were  addressed. Maximal Sterile Barrier Technique was utilized including caps, mask, sterile gowns, sterile gloves, sterile drape, hand hygiene and skin antiseptic. A timeout was performed prior to the initiation of the procedure. A preliminary ultrasound of the right groin was performed and demonstrates a patent right common femoral vein. A  permanent ultrasound image was recorded. Using a combination of fluoroscopy and ultrasound, an access site was determined. A small dermatotomy was made at the planned puncture site. Using ultrasound guidance, access into the right common femoral vein was obtained with visualization of needle entry into the vessel using a standard micropuncture technique. A wire was advanced into the IVC insert all fascial dilation performed. An 8 French, 11 cm vascular sheath was placed into the external iliac vein. Through this access site, an 21 French Accunav ICE catheter was advanced with ease under fluoroscopic guidance to the level of the intrahepatic inferior vena cava. A preliminary ultrasound of the right neck was performed and demonstrates a patent internal jugular vein. A permanent ultrasound image was recorded. Using a combination of fluoroscopy and ultrasound, an access site was determined. A small dermatotomy was made at the planned puncture site. Using ultrasound guidance, access into the right internal jugular vein was obtained with visualization of needle entry into the vessel using a standard micropuncture technique. A wire was advanced into the IVC and serial fascial dilation performed. A 10 French tips sheath was placed into the internal jugular vein and advanced to the IVC. The jugular sheath was retracted into the right atrium and manometry was performed. A 5 French angled tip catheter was then directed into the middle hepatic vein. The catheter was advanced to a wedge portion of the a patent vein over which the 10 French sheath was advanced into the middle hepatic vein. Using ICE ultrasound visualization the catheter within the middle hepatic vein as well as the portal anatomy was defined. A planned exit site from the hepatic vein and puncture site from the portal vein was placed into a single sonographic plane. Under direct ultrasound visualization, the ScorpionX needle was advanced into the central right  portal vein. Hand injection of contrast confirmed position within the portal system. A Glidewire Advantage was then advanced into the splenic vein. A 5 French marking pigtail catheter was then advanced over the wire into the main portal vein and wire removed. Portal venogram was performed which demonstrated a patent portal vein with hepatofugal flow. Multiple large varices were seen arising from the main portal vein. Portal manometry was then performed. The tract was then dilated to 6 mm with an 6 mm x 8 cm Athletis balloon. A 6-10 mm by 7 + 2 cm of Viatorr endograft was placed. This was ultimately dilated to 6 mm. Repeat portal venogram demonstrated persistent hepatopetal flow with preferential opacification of the large gastric varices. A 7 French, 55 cm Tourguide sheath was directed to the central aspect of the splenic vein. Repeat venogram demonstrated a prominent left gastric and too prominent posterior gastric veins with hepatofugal flow into large gastric varices. The left gastric vein was then selected. Dedicated angiogram demonstrated hepatofugal flow through multiple large varices draining via the gastro renal shunt. Coil embolization was then performed about the central aspect of the left gastric vein with an assortment of detachable 0.035  Azur coils. The catheter was then redirected into 1 of the posterior gastric veins in venogram demonstrated hepatopetal flow draining via the large gastro renal shunt. Coil embolization was then performed about the central aspect of  the left gastric vein with an assortment of detachable 0.035 Azur coils. Next, the catheter was then redirected into an additional posterior gastric vein and venogram demonstrated hepatofugal flow draining via the large gastro renal shunt. Coil embolization was then performed about the central aspect of the left gastric vein with an assortment of detachable 0.035  Azur coils. Catheter was retracted into the splenic vein and venogram was  performed which demonstrated persistent albeit significantly slowed hepatopetal flow through the gastric varices and a patent appearing portal vein. The coaxial catheter in sheath system was then removed from the portal vein directed to the inferior vena cava, left renal vein, and into the gastro renal shunt. A 5 French Fogarty balloon was then inserted and inflated in the central aspect of the gastro renal shunt with venogram demonstrating persistent patency of the gastric varices. The balloon was deflated in the contrast cleared via the left renal vein. The balloon was then reinflated and transvenous obliteration was performed with a 3-2-1 ratio of air, 3% Sotradecol , and lipiodol . Sclerosis at was administered under fluoroscopic visualization. With the balloon catheter in place, coil embolization was performed of the gastro renal shunt with an assortment of detachable 0.035 Azur coils. The balloon was then deflated slowly. Completion venogram demonstrated patency of the left renal vein. The catheters and sheaths were removed and manual compression was applied to the right internal jugular and right common femoral venous access sites until hemostasis was achieved. The patient was transferred to the ICU in stable condition. Pre-TIPS Mean Pressures (mmHg): Right atrium: 13 Portal vein: 26 Portosystemic gradient: 13 Post-TIPS Mean Pressures (mmHg): Not obtained due to erroneous measurements from extensive gastric variceal embolization. IMPRESSION: 1. Successful transjugular portosystemic shunt creation. 2. Technically successful coil embolization of a the left gastric vein and 2 posterior gastric veins to exclude large gastric varices. 3. Technically successful coil assisted retrograde transvenous obliteration of large gastrorenal shunt. Ester Sides, MD [facility] Radiology Electronically Signed   By: Ester Sides M.D.   On: 10/11/2024 14:54   IR EMBO VENOUS NOT  HEMORR HEMANG  INC GUIDE ROADMAPPING Result Date: 10/11/2024 CLINICAL DATA:  59 year old female with history of primary biliary cirrhosis complicated by gastric varices and gastro renal shunt presenting with gastric variceal hemorrhage. EXAM: 1. Ultrasound-guided access of the right internal jugular vein 2. Ultrasound-guided access of the right common femoral vein 3. Intravascular ultrasound 4. Catheterization of the portal vein 5. Portal venous and central manometry 6. Portal venogram 7. Creation of a transhepatic portal vein to hepatic vein shunt 8. Coil embolization of the left gastric and 2 posterior gastric veins 9. Coil assisted retrograde transvenous obliteration of gastro renal shunt MEDICATIONS: As antibiotic prophylaxis, Rocephin  1 gm IV was ordered pre-procedure and administered intravenously within one hour of incision. ANESTHESIA/SEDATION: General - as administered by the Anesthesia department CONTRAST:  Fifty ML Omnipaque  300, intravenous FLUOROSCOPY TIME:  Four hundred eighty mGy reference air kerma COMPLICATIONS: None immediate. PROCEDURE: Informed written consent was obtained from the patient after a thorough discussion of the procedural risks, benefits and alternatives. All questions were addressed. Maximal Sterile Barrier Technique was utilized including caps, mask, sterile gowns, sterile gloves, sterile drape, hand hygiene and skin antiseptic. A timeout was performed prior to the initiation of the procedure. A preliminary ultrasound of the right groin was performed and demonstrates a patent right common femoral vein. A permanent ultrasound image was recorded. Using a combination of fluoroscopy and ultrasound, an access site was determined.  A small dermatotomy was made at the planned puncture site. Using ultrasound guidance, access into the right common femoral vein was obtained with visualization of needle entry into the vessel using a standard micropuncture technique. A wire was advanced into  the IVC insert all fascial dilation performed. An 8 French, 11 cm vascular sheath was placed into the external iliac vein. Through this access site, an 85 French Accunav ICE catheter was advanced with ease under fluoroscopic guidance to the level of the intrahepatic inferior vena cava. A preliminary ultrasound of the right neck was performed and demonstrates a patent internal jugular vein. A permanent ultrasound image was recorded. Using a combination of fluoroscopy and ultrasound, an access site was determined. A small dermatotomy was made at the planned puncture site. Using ultrasound guidance, access into the right internal jugular vein was obtained with visualization of needle entry into the vessel using a standard micropuncture technique. A wire was advanced into the IVC and serial fascial dilation performed. A 10 French tips sheath was placed into the internal jugular vein and advanced to the IVC. The jugular sheath was retracted into the right atrium and manometry was performed. A 5 French angled tip catheter was then directed into the middle hepatic vein. The catheter was advanced to a wedge portion of the a patent vein over which the 10 French sheath was advanced into the middle hepatic vein. Using ICE ultrasound visualization the catheter within the middle hepatic vein as well as the portal anatomy was defined. A planned exit site from the hepatic vein and puncture site from the portal vein was placed into a single sonographic plane. Under direct ultrasound visualization, the ScorpionX needle was advanced into the central right portal vein. Hand injection of contrast confirmed position within the portal system. A Glidewire Advantage was then advanced into the splenic vein. A 5 French marking pigtail catheter was then advanced over the wire into the main portal vein and wire removed. Portal venogram was performed which demonstrated a patent portal vein with hepatofugal flow. Multiple large varices were seen  arising from the main portal vein. Portal manometry was then performed. The tract was then dilated to 6 mm with an 6 mm x 8 cm Athletis balloon. A 6-10 mm by 7 + 2 cm of Viatorr endograft was placed. This was ultimately dilated to 6 mm. Repeat portal venogram demonstrated persistent hepatopetal flow with preferential opacification of the large gastric varices. A 7 French, 55 cm Tourguide sheath was directed to the central aspect of the splenic vein. Repeat venogram demonstrated a prominent left gastric and too prominent posterior gastric veins with hepatofugal flow into large gastric varices. The left gastric vein was then selected. Dedicated angiogram demonstrated hepatofugal flow through multiple large varices draining via the gastro renal shunt. Coil embolization was then performed about the central aspect of the left gastric vein with an assortment of detachable 0.035  Azur coils. The catheter was then redirected into 1 of the posterior gastric veins in venogram demonstrated hepatopetal flow draining via the large gastro renal shunt. Coil embolization was then performed about the central aspect of the left gastric vein with an assortment of detachable 0.035 Azur coils. Next, the catheter was then redirected into an additional posterior gastric vein and venogram demonstrated hepatofugal flow draining via the large gastro renal shunt. Coil embolization was then performed about the central aspect of the left gastric vein with an assortment of detachable 0.035  Azur coils. Catheter was retracted into the splenic vein and  venogram was performed which demonstrated persistent albeit significantly slowed hepatopetal flow through the gastric varices and a patent appearing portal vein. The coaxial catheter in sheath system was then removed from the portal vein directed to the inferior vena cava, left renal vein, and into the gastro renal shunt. A 5 French Fogarty balloon was then inserted and inflated in the central  aspect of the gastro renal shunt with venogram demonstrating persistent patency of the gastric varices. The balloon was deflated in the contrast cleared via the left renal vein. The balloon was then reinflated and transvenous obliteration was performed with a 3-2-1 ratio of air, 3% Sotradecol , and lipiodol . Sclerosis at was administered under fluoroscopic visualization. With the balloon catheter in place, coil embolization was performed of the gastro renal shunt with an assortment of detachable 0.035 Azur coils. The balloon was then deflated slowly. Completion venogram demonstrated patency of the left renal vein. The catheters and sheaths were removed and manual compression was applied to the right internal jugular and right common femoral venous access sites until hemostasis was achieved. The patient was transferred to the ICU in stable condition. Pre-TIPS Mean Pressures (mmHg): Right atrium: 13 Portal vein: 26 Portosystemic gradient: 13 Post-TIPS Mean Pressures (mmHg): Not obtained due to erroneous measurements from extensive gastric variceal embolization. IMPRESSION: 1. Successful transjugular portosystemic shunt creation. 2. Technically successful coil embolization of a the left gastric vein and 2 posterior gastric veins to exclude large gastric varices. 3. Technically successful coil assisted retrograde transvenous obliteration of large gastrorenal shunt. Ester Sides, MD [facility] Radiology Electronically Signed   By: Ester Sides M.D.   On: 10/11/2024 14:54   IR US  Guide Vasc Access Right Result Date: 10/11/2024 CLINICAL DATA:  59 year old female with history of primary biliary cirrhosis complicated by gastric varices and gastro renal shunt presenting with gastric variceal hemorrhage. EXAM: 1. Ultrasound-guided access of the right internal jugular vein 2. Ultrasound-guided access of the right common femoral vein 3. Intravascular ultrasound 4.  Catheterization of the portal vein 5. Portal venous and central manometry 6. Portal venogram 7. Creation of a transhepatic portal vein to hepatic vein shunt 8. Coil embolization of the left gastric and 2 posterior gastric veins 9. Coil assisted retrograde transvenous obliteration of gastro renal shunt MEDICATIONS: As antibiotic prophylaxis, Rocephin  1 gm IV was ordered pre-procedure and administered intravenously within one hour of incision. ANESTHESIA/SEDATION: General - as administered by the Anesthesia department CONTRAST:  Fifty ML Omnipaque  300, intravenous FLUOROSCOPY TIME:  Four hundred eighty mGy reference air kerma COMPLICATIONS: None immediate. PROCEDURE: Informed written consent was obtained from the patient after a thorough discussion of the procedural risks, benefits and alternatives. All questions were addressed. Maximal Sterile Barrier Technique was utilized including caps, mask, sterile gowns, sterile gloves, sterile drape, hand hygiene and skin antiseptic. A timeout was performed prior to the initiation of the procedure. A preliminary ultrasound of the right groin was performed and demonstrates a patent right common femoral vein. A permanent ultrasound image was recorded. Using a combination of fluoroscopy and ultrasound, an access site was determined. A small dermatotomy was made at the planned puncture site. Using ultrasound guidance, access into the right common femoral vein was obtained with visualization of needle entry into the vessel using a standard micropuncture technique. A wire was advanced into the IVC insert all fascial dilation performed. An 8 French, 11 cm vascular sheath was placed into the external iliac vein. Through this access site, an 8 French Accunav ICE catheter  was advanced with ease under fluoroscopic guidance to the level of the intrahepatic inferior vena cava. A preliminary ultrasound of the right neck was performed and demonstrates a patent internal jugular vein. A  permanent ultrasound image was recorded. Using a combination of fluoroscopy and ultrasound, an access site was determined. A small dermatotomy was made at the planned puncture site. Using ultrasound guidance, access into the right internal jugular vein was obtained with visualization of needle entry into the vessel using a standard micropuncture technique. A wire was advanced into the IVC and serial fascial dilation performed. A 10 French tips sheath was placed into the internal jugular vein and advanced to the IVC. The jugular sheath was retracted into the right atrium and manometry was performed. A 5 French angled tip catheter was then directed into the middle hepatic vein. The catheter was advanced to a wedge portion of the a patent vein over which the 10 French sheath was advanced into the middle hepatic vein. Using ICE ultrasound visualization the catheter within the middle hepatic vein as well as the portal anatomy was defined. A planned exit site from the hepatic vein and puncture site from the portal vein was placed into a single sonographic plane. Under direct ultrasound visualization, the ScorpionX needle was advanced into the central right portal vein. Hand injection of contrast confirmed position within the portal system. A Glidewire Advantage was then advanced into the splenic vein. A 5 French marking pigtail catheter was then advanced over the wire into the main portal vein and wire removed. Portal venogram was performed which demonstrated a patent portal vein with hepatofugal flow. Multiple large varices were seen arising from the main portal vein. Portal manometry was then performed. The tract was then dilated to 6 mm with an 6 mm x 8 cm Athletis balloon. A 6-10 mm by 7 + 2 cm of Viatorr endograft was placed. This was ultimately dilated to 6 mm. Repeat portal venogram demonstrated persistent hepatopetal flow with preferential opacification of the large gastric varices. A 7 French, 55 cm Tourguide  sheath was directed to the central aspect of the splenic vein. Repeat venogram demonstrated a prominent left gastric and too prominent posterior gastric veins with hepatofugal flow into large gastric varices. The left gastric vein was then selected. Dedicated angiogram demonstrated hepatofugal flow through multiple large varices draining via the gastro renal shunt. Coil embolization was then performed about the central aspect of the left gastric vein with an assortment of detachable 0.035  Azur coils. The catheter was then redirected into 1 of the posterior gastric veins in venogram demonstrated hepatopetal flow draining via the large gastro renal shunt. Coil embolization was then performed about the central aspect of the left gastric vein with an assortment of detachable 0.035 Azur coils. Next, the catheter was then redirected into an additional posterior gastric vein and venogram demonstrated hepatofugal flow draining via the large gastro renal shunt. Coil embolization was then performed about the central aspect of the left gastric vein with an assortment of detachable 0.035  Azur coils. Catheter was retracted into the splenic vein and venogram was performed which demonstrated persistent albeit significantly slowed hepatopetal flow through the gastric varices and a patent appearing portal vein. The coaxial catheter in sheath system was then removed from the portal vein directed to the inferior vena cava, left renal vein, and into the gastro renal shunt. A 5 French Fogarty balloon was then inserted and inflated in the central aspect of the gastro renal shunt with venogram demonstrating  persistent patency of the gastric varices. The balloon was deflated in the contrast cleared via the left renal vein. The balloon was then reinflated and transvenous obliteration was performed with a 3-2-1 ratio of air, 3% Sotradecol , and lipiodol . Sclerosis at was administered under fluoroscopic visualization. With the balloon  catheter in place, coil embolization was performed of the gastro renal shunt with an assortment of detachable 0.035 Azur coils. The balloon was then deflated slowly. Completion venogram demonstrated patency of the left renal vein. The catheters and sheaths were removed and manual compression was applied to the right internal jugular and right common femoral venous access sites until hemostasis was achieved. The patient was transferred to the ICU in stable condition. Pre-TIPS Mean Pressures (mmHg): Right atrium: 13 Portal vein: 26 Portosystemic gradient: 13 Post-TIPS Mean Pressures (mmHg): Not obtained due to erroneous measurements from extensive gastric variceal embolization. IMPRESSION: 1. Successful transjugular portosystemic shunt creation. 2. Technically successful coil embolization of a the left gastric vein and 2 posterior gastric veins to exclude large gastric varices. 3. Technically successful coil assisted retrograde transvenous obliteration of large gastrorenal shunt. Ester Sides, MD [facility] Radiology Electronically Signed   By: Ester Sides M.D.   On: 10/11/2024 14:54   IR US  Guide Vasc Access Right Result Date: 10/11/2024 CLINICAL DATA:  59 year old female with history of primary biliary cirrhosis complicated by gastric varices and gastro renal shunt presenting with gastric variceal hemorrhage. EXAM: 1. Ultrasound-guided access of the right internal jugular vein 2. Ultrasound-guided access of the right common femoral vein 3. Intravascular ultrasound 4. Catheterization of the portal vein 5. Portal venous and central manometry 6. Portal venogram 7. Creation of a transhepatic portal vein to hepatic vein shunt 8. Coil embolization of the left gastric and 2 posterior gastric veins 9. Coil assisted retrograde transvenous obliteration of gastro renal shunt MEDICATIONS: As antibiotic prophylaxis, Rocephin  1 gm IV was ordered pre-procedure and  administered intravenously within one hour of incision. ANESTHESIA/SEDATION: General - as administered by the Anesthesia department CONTRAST:  Fifty ML Omnipaque  300, intravenous FLUOROSCOPY TIME:  Four hundred eighty mGy reference air kerma COMPLICATIONS: None immediate. PROCEDURE: Informed written consent was obtained from the patient after a thorough discussion of the procedural risks, benefits and alternatives. All questions were addressed. Maximal Sterile Barrier Technique was utilized including caps, mask, sterile gowns, sterile gloves, sterile drape, hand hygiene and skin antiseptic. A timeout was performed prior to the initiation of the procedure. A preliminary ultrasound of the right groin was performed and demonstrates a patent right common femoral vein. A permanent ultrasound image was recorded. Using a combination of fluoroscopy and ultrasound, an access site was determined. A small dermatotomy was made at the planned puncture site. Using ultrasound guidance, access into the right common femoral vein was obtained with visualization of needle entry into the vessel using a standard micropuncture technique. A wire was advanced into the IVC insert all fascial dilation performed. An 8 French, 11 cm vascular sheath was placed into the external iliac vein. Through this access site, an 68 French Accunav ICE catheter was advanced with ease under fluoroscopic guidance to the level of the intrahepatic inferior vena cava. A preliminary ultrasound of the right neck was performed and demonstrates a patent internal jugular vein. A permanent ultrasound image was recorded. Using a combination of fluoroscopy and ultrasound, an access site was determined. A small dermatotomy was made at the planned puncture site. Using ultrasound guidance, access into the right internal jugular vein was  obtained with visualization of needle entry into the vessel using a standard micropuncture technique. A wire was advanced into the IVC and  serial fascial dilation performed. A 10 French tips sheath was placed into the internal jugular vein and advanced to the IVC. The jugular sheath was retracted into the right atrium and manometry was performed. A 5 French angled tip catheter was then directed into the middle hepatic vein. The catheter was advanced to a wedge portion of the a patent vein over which the 10 French sheath was advanced into the middle hepatic vein. Using ICE ultrasound visualization the catheter within the middle hepatic vein as well as the portal anatomy was defined. A planned exit site from the hepatic vein and puncture site from the portal vein was placed into a single sonographic plane. Under direct ultrasound visualization, the ScorpionX needle was advanced into the central right portal vein. Hand injection of contrast confirmed position within the portal system. A Glidewire Advantage was then advanced into the splenic vein. A 5 French marking pigtail catheter was then advanced over the wire into the main portal vein and wire removed. Portal venogram was performed which demonstrated a patent portal vein with hepatofugal flow. Multiple large varices were seen arising from the main portal vein. Portal manometry was then performed. The tract was then dilated to 6 mm with an 6 mm x 8 cm Athletis balloon. A 6-10 mm by 7 + 2 cm of Viatorr endograft was placed. This was ultimately dilated to 6 mm. Repeat portal venogram demonstrated persistent hepatopetal flow with preferential opacification of the large gastric varices. A 7 French, 55 cm Tourguide sheath was directed to the central aspect of the splenic vein. Repeat venogram demonstrated a prominent left gastric and too prominent posterior gastric veins with hepatofugal flow into large gastric varices. The left gastric vein was then selected. Dedicated angiogram demonstrated hepatofugal flow through multiple large varices draining via the gastro renal shunt. Coil embolization was then  performed about the central aspect of the left gastric vein with an assortment of detachable 0.035  Azur coils. The catheter was then redirected into 1 of the posterior gastric veins in venogram demonstrated hepatopetal flow draining via the large gastro renal shunt. Coil embolization was then performed about the central aspect of the left gastric vein with an assortment of detachable 0.035 Azur coils. Next, the catheter was then redirected into an additional posterior gastric vein and venogram demonstrated hepatofugal flow draining via the large gastro renal shunt. Coil embolization was then performed about the central aspect of the left gastric vein with an assortment of detachable 0.035  Azur coils. Catheter was retracted into the splenic vein and venogram was performed which demonstrated persistent albeit significantly slowed hepatopetal flow through the gastric varices and a patent appearing portal vein. The coaxial catheter in sheath system was then removed from the portal vein directed to the inferior vena cava, left renal vein, and into the gastro renal shunt. A 5 French Fogarty balloon was then inserted and inflated in the central aspect of the gastro renal shunt with venogram demonstrating persistent patency of the gastric varices. The balloon was deflated in the contrast cleared via the left renal vein. The balloon was then reinflated and transvenous obliteration was performed with a 3-2-1 ratio of air, 3% Sotradecol , and lipiodol . Sclerosis at was administered under fluoroscopic visualization. With the balloon catheter in place, coil embolization was performed of the gastro renal shunt with an assortment of detachable 0.035 Azur coils. The balloon  was then deflated slowly. Completion venogram demonstrated patency of the left renal vein. The catheters and sheaths were removed and manual compression was applied to the right internal jugular and right common femoral venous access sites until hemostasis  was achieved. The patient was transferred to the ICU in stable condition. Pre-TIPS Mean Pressures (mmHg): Right atrium: 13 Portal vein: 26 Portosystemic gradient: 13 Post-TIPS Mean Pressures (mmHg): Not obtained due to erroneous measurements from extensive gastric variceal embolization. IMPRESSION: 1. Successful transjugular portosystemic shunt creation. 2. Technically successful coil embolization of a the left gastric vein and 2 posterior gastric veins to exclude large gastric varices. 3. Technically successful coil assisted retrograde transvenous obliteration of large gastrorenal shunt. Ester Sides, MD [facility] Radiology Electronically Signed   By: Ester Sides M.D.   On: 10/11/2024 14:54   IR VEIN SCLEROSING SINGLE INCOMPOTENT Result Date: 10/11/2024 CLINICAL DATA:  59 year old female with history of primary biliary cirrhosis complicated by gastric varices and gastro renal shunt presenting with gastric variceal hemorrhage. EXAM: 1. Ultrasound-guided access of the right internal jugular vein 2. Ultrasound-guided access of the right common femoral vein 3. Intravascular ultrasound 4. Catheterization of the portal vein 5. Portal venous and central manometry 6. Portal venogram 7. Creation of a transhepatic portal vein to hepatic vein shunt 8. Coil embolization of the left gastric and 2 posterior gastric veins 9. Coil assisted retrograde transvenous obliteration of gastro renal shunt MEDICATIONS: As antibiotic prophylaxis, Rocephin  1 gm IV was ordered pre-procedure and administered intravenously within one hour of incision. ANESTHESIA/SEDATION: General - as administered by the Anesthesia department CONTRAST:  Fifty ML Omnipaque  300, intravenous FLUOROSCOPY TIME:  Four hundred eighty mGy reference air kerma COMPLICATIONS: None immediate. PROCEDURE: Informed written consent was obtained from the patient after a thorough discussion of the procedural risks, benefits  and alternatives. All questions were addressed. Maximal Sterile Barrier Technique was utilized including caps, mask, sterile gowns, sterile gloves, sterile drape, hand hygiene and skin antiseptic. A timeout was performed prior to the initiation of the procedure. A preliminary ultrasound of the right groin was performed and demonstrates a patent right common femoral vein. A permanent ultrasound image was recorded. Using a combination of fluoroscopy and ultrasound, an access site was determined. A small dermatotomy was made at the planned puncture site. Using ultrasound guidance, access into the right common femoral vein was obtained with visualization of needle entry into the vessel using a standard micropuncture technique. A wire was advanced into the IVC insert all fascial dilation performed. An 8 French, 11 cm vascular sheath was placed into the external iliac vein. Through this access site, an 82 French Accunav ICE catheter was advanced with ease under fluoroscopic guidance to the level of the intrahepatic inferior vena cava. A preliminary ultrasound of the right neck was performed and demonstrates a patent internal jugular vein. A permanent ultrasound image was recorded. Using a combination of fluoroscopy and ultrasound, an access site was determined. A small dermatotomy was made at the planned puncture site. Using ultrasound guidance, access into the right internal jugular vein was obtained with visualization of needle entry into the vessel using a standard micropuncture technique. A wire was advanced into the IVC and serial fascial dilation performed. A 10 French tips sheath was placed into the internal jugular vein and advanced to the IVC. The jugular sheath was retracted into the right atrium and manometry was performed. A 5 French angled tip catheter was then directed into the middle hepatic vein. The catheter  was advanced to a wedge portion of the a patent vein over which the 10 French sheath was advanced  into the middle hepatic vein. Using ICE ultrasound visualization the catheter within the middle hepatic vein as well as the portal anatomy was defined. A planned exit site from the hepatic vein and puncture site from the portal vein was placed into a single sonographic plane. Under direct ultrasound visualization, the ScorpionX needle was advanced into the central right portal vein. Hand injection of contrast confirmed position within the portal system. A Glidewire Advantage was then advanced into the splenic vein. A 5 French marking pigtail catheter was then advanced over the wire into the main portal vein and wire removed. Portal venogram was performed which demonstrated a patent portal vein with hepatofugal flow. Multiple large varices were seen arising from the main portal vein. Portal manometry was then performed. The tract was then dilated to 6 mm with an 6 mm x 8 cm Athletis balloon. A 6-10 mm by 7 + 2 cm of Viatorr endograft was placed. This was ultimately dilated to 6 mm. Repeat portal venogram demonstrated persistent hepatopetal flow with preferential opacification of the large gastric varices. A 7 French, 55 cm Tourguide sheath was directed to the central aspect of the splenic vein. Repeat venogram demonstrated a prominent left gastric and too prominent posterior gastric veins with hepatofugal flow into large gastric varices. The left gastric vein was then selected. Dedicated angiogram demonstrated hepatofugal flow through multiple large varices draining via the gastro renal shunt. Coil embolization was then performed about the central aspect of the left gastric vein with an assortment of detachable 0.035  Azur coils. The catheter was then redirected into 1 of the posterior gastric veins in venogram demonstrated hepatopetal flow draining via the large gastro renal shunt. Coil embolization was then performed about the central aspect of the left gastric vein with an assortment of detachable 0.035 Azur  coils. Next, the catheter was then redirected into an additional posterior gastric vein and venogram demonstrated hepatofugal flow draining via the large gastro renal shunt. Coil embolization was then performed about the central aspect of the left gastric vein with an assortment of detachable 0.035  Azur coils. Catheter was retracted into the splenic vein and venogram was performed which demonstrated persistent albeit significantly slowed hepatopetal flow through the gastric varices and a patent appearing portal vein. The coaxial catheter in sheath system was then removed from the portal vein directed to the inferior vena cava, left renal vein, and into the gastro renal shunt. A 5 French Fogarty balloon was then inserted and inflated in the central aspect of the gastro renal shunt with venogram demonstrating persistent patency of the gastric varices. The balloon was deflated in the contrast cleared via the left renal vein. The balloon was then reinflated and transvenous obliteration was performed with a 3-2-1 ratio of air, 3% Sotradecol , and lipiodol . Sclerosis at was administered under fluoroscopic visualization. With the balloon catheter in place, coil embolization was performed of the gastro renal shunt with an assortment of detachable 0.035 Azur coils. The balloon was then deflated slowly. Completion venogram demonstrated patency of the left renal vein. The catheters and sheaths were removed and manual compression was applied to the right internal jugular and right common femoral venous access sites until hemostasis was achieved. The patient was transferred to the ICU in stable condition. Pre-TIPS Mean Pressures (mmHg): Right atrium: 13 Portal vein: 26 Portosystemic gradient: 13 Post-TIPS Mean Pressures (mmHg): Not obtained due to erroneous  measurements from extensive gastric variceal embolization. IMPRESSION: 1. Successful transjugular portosystemic shunt creation. 2. Technically successful coil embolization  of a the left gastric vein and 2 posterior gastric veins to exclude large gastric varices. 3. Technically successful coil assisted retrograde transvenous obliteration of large gastrorenal shunt. Ester Sides, MD [facility] Radiology Electronically Signed   By: Ester Sides M.D.   On: 10/11/2024 14:54   IR INTRAVASCULAR ULTRASOUND NON CORONARY Result Date: 10/11/2024 CLINICAL DATA:  59 year old female with history of primary biliary cirrhosis complicated by gastric varices and gastro renal shunt presenting with gastric variceal hemorrhage. EXAM: 1. Ultrasound-guided access of the right internal jugular vein 2. Ultrasound-guided access of the right common femoral vein 3. Intravascular ultrasound 4. Catheterization of the portal vein 5. Portal venous and central manometry 6. Portal venogram 7. Creation of a transhepatic portal vein to hepatic vein shunt 8. Coil embolization of the left gastric and 2 posterior gastric veins 9. Coil assisted retrograde transvenous obliteration of gastro renal shunt MEDICATIONS: As antibiotic prophylaxis, Rocephin  1 gm IV was ordered pre-procedure and administered intravenously within one hour of incision. ANESTHESIA/SEDATION: General - as administered by the Anesthesia department CONTRAST:  Fifty ML Omnipaque  300, intravenous FLUOROSCOPY TIME:  Four hundred eighty mGy reference air kerma COMPLICATIONS: None immediate. PROCEDURE: Informed written consent was obtained from the patient after a thorough discussion of the procedural risks, benefits and alternatives. All questions were addressed. Maximal Sterile Barrier Technique was utilized including caps, mask, sterile gowns, sterile gloves, sterile drape, hand hygiene and skin antiseptic. A timeout was performed prior to the initiation of the procedure. A preliminary ultrasound of the right groin was performed and demonstrates a patent right common femoral vein. A permanent ultrasound  image was recorded. Using a combination of fluoroscopy and ultrasound, an access site was determined. A small dermatotomy was made at the planned puncture site. Using ultrasound guidance, access into the right common femoral vein was obtained with visualization of needle entry into the vessel using a standard micropuncture technique. A wire was advanced into the IVC insert all fascial dilation performed. An 8 French, 11 cm vascular sheath was placed into the external iliac vein. Through this access site, an 33 French Accunav ICE catheter was advanced with ease under fluoroscopic guidance to the level of the intrahepatic inferior vena cava. A preliminary ultrasound of the right neck was performed and demonstrates a patent internal jugular vein. A permanent ultrasound image was recorded. Using a combination of fluoroscopy and ultrasound, an access site was determined. A small dermatotomy was made at the planned puncture site. Using ultrasound guidance, access into the right internal jugular vein was obtained with visualization of needle entry into the vessel using a standard micropuncture technique. A wire was advanced into the IVC and serial fascial dilation performed. A 10 French tips sheath was placed into the internal jugular vein and advanced to the IVC. The jugular sheath was retracted into the right atrium and manometry was performed. A 5 French angled tip catheter was then directed into the middle hepatic vein. The catheter was advanced to a wedge portion of the a patent vein over which the 10 French sheath was advanced into the middle hepatic vein. Using ICE ultrasound visualization the catheter within the middle hepatic vein as well as the portal anatomy was defined. A planned exit site from the hepatic vein and puncture site from the portal vein was placed into a single sonographic plane. Under direct ultrasound visualization, the ScorpionX needle  was advanced into the central right portal vein. Hand  injection of contrast confirmed position within the portal system. A Glidewire Advantage was then advanced into the splenic vein. A 5 French marking pigtail catheter was then advanced over the wire into the main portal vein and wire removed. Portal venogram was performed which demonstrated a patent portal vein with hepatofugal flow. Multiple large varices were seen arising from the main portal vein. Portal manometry was then performed. The tract was then dilated to 6 mm with an 6 mm x 8 cm Athletis balloon. A 6-10 mm by 7 + 2 cm of Viatorr endograft was placed. This was ultimately dilated to 6 mm. Repeat portal venogram demonstrated persistent hepatopetal flow with preferential opacification of the large gastric varices. A 7 French, 55 cm Tourguide sheath was directed to the central aspect of the splenic vein. Repeat venogram demonstrated a prominent left gastric and too prominent posterior gastric veins with hepatofugal flow into large gastric varices. The left gastric vein was then selected. Dedicated angiogram demonstrated hepatofugal flow through multiple large varices draining via the gastro renal shunt. Coil embolization was then performed about the central aspect of the left gastric vein with an assortment of detachable 0.035  Azur coils. The catheter was then redirected into 1 of the posterior gastric veins in venogram demonstrated hepatopetal flow draining via the large gastro renal shunt. Coil embolization was then performed about the central aspect of the left gastric vein with an assortment of detachable 0.035 Azur coils. Next, the catheter was then redirected into an additional posterior gastric vein and venogram demonstrated hepatofugal flow draining via the large gastro renal shunt. Coil embolization was then performed about the central aspect of the left gastric vein with an assortment of detachable 0.035  Azur coils. Catheter was retracted into the splenic vein and venogram was performed which  demonstrated persistent albeit significantly slowed hepatopetal flow through the gastric varices and a patent appearing portal vein. The coaxial catheter in sheath system was then removed from the portal vein directed to the inferior vena cava, left renal vein, and into the gastro renal shunt. A 5 French Fogarty balloon was then inserted and inflated in the central aspect of the gastro renal shunt with venogram demonstrating persistent patency of the gastric varices. The balloon was deflated in the contrast cleared via the left renal vein. The balloon was then reinflated and transvenous obliteration was performed with a 3-2-1 ratio of air, 3% Sotradecol , and lipiodol . Sclerosis at was administered under fluoroscopic visualization. With the balloon catheter in place, coil embolization was performed of the gastro renal shunt with an assortment of detachable 0.035 Azur coils. The balloon was then deflated slowly. Completion venogram demonstrated patency of the left renal vein. The catheters and sheaths were removed and manual compression was applied to the right internal jugular and right common femoral venous access sites until hemostasis was achieved. The patient was transferred to the ICU in stable condition. Pre-TIPS Mean Pressures (mmHg): Right atrium: 13 Portal vein: 26 Portosystemic gradient: 13 Post-TIPS Mean Pressures (mmHg): Not obtained due to erroneous measurements from extensive gastric variceal embolization. IMPRESSION: 1. Successful transjugular portosystemic shunt creation. 2. Technically successful coil embolization of a the left gastric vein and 2 posterior gastric veins to exclude large gastric varices. 3. Technically successful coil assisted retrograde transvenous obliteration of large gastrorenal shunt. Ester Sides, MD [facility] Electronically Signed   By: Ester Sides M.D.   On: 10/11/2024 14:54  DG CHEST PORT 1 VIEW Result Date:  10/11/2024 EXAM: 1 VIEW(S) XRAY OF THE CHEST 10/11/2024 07:46:00 AM COMPARISON: 08/28/2024 CLINICAL HISTORY: Hypoxia. FINDINGS: LINES, TUBES AND DEVICES: Endotracheal tube in place with tip 3.1 cm above the carina. LUNGS AND PLEURA: Mildly decreased lung volumes. Mild left basilar linear subsegmental atelectasis. No pleural effusion. No pneumothorax. HEART AND MEDIASTINUM: No acute abnormality of the cardiac and mediastinal silhouettes. BONES AND SOFT TISSUES: No acute osseous abnormality. Left upper quadrant embolization coils noted. IMPRESSION: 1. Endotracheal tube tip 3.1 cm above the carina. 2. Mild left basilar linear subsegmental atelectasis. 3. Left upper quadrant embolization coils. Electronically signed by: Evalene Coho MD 10/11/2024 07:50 AM EST RP Workstation: [id number]   CT ABDOMEN PELVIS W CONTRAST Result Date: 10/10/2024 EXAM: CT ABDOMEN AND PELVIS WITH CONTRAST 10/10/2024 01:43:16 AM TECHNIQUE: CT of the abdomen and pelvis was performed with the administration of 75 mL of iohexol  (OMNIPAQUE ) 350 MG/ML injection. Multiplanar reformatted images are provided for review. Automated exposure control, iterative reconstruction, and/or weight-based adjustment of the mA/kV was utilized to reduce the radiation dose to as low as reasonably achievable. COMPARISON: None available. CLINICAL HISTORY: History of cirrhosis. Abdominal pain. Epigastric. Vomiting blood. Elevated lipase. Likely related to esophageal varices. FINDINGS: LOWER CHEST: No acute abnormality. LIVER: Nodular liver compatible with cirrhosis. GALLBLADDER AND BILE DUCTS: Small gallstone noted within the gallbladder. No biliary ductal dilatation. SPLEEN: Normal size. No focal abnormality. PANCREAS: No acute abnormality. ADRENAL GLANDS: No acute abnormality. KIDNEYS, URETERS AND BLADDER: No stones in the kidneys or ureters. No hydronephrosis. No perinephric or periureteral stranding. Urinary bladder is unremarkable. GI AND BOWEL: The stomach is  moderately distended with fluid and debris. Normal appendix. Mild wall thickening in the right colon could reflect portal colopathy or colitis. There is no bowel obstruction. PERITONEUM AND RETROPERITONEUM: No ascites. No free air. VASCULATURE: Associated upper abdominal varices. Aorta is normal in caliber. Spontaneous left splenorenal shunt. LYMPH NODES: No lymphadenopathy. REPRODUCTIVE ORGANS: No acute abnormality. BONES AND SOFT TISSUES: No acute osseous abnormality. No focal soft tissue abnormality. IMPRESSION: 1. Nodular liver compatible with cirrhosis, with associated upper abdominal varices and spontaneous left splenorenal shunt. 2. Moderately distended stomach with fluid and debris. 3. Mild wall thickening in the right colon, possibly reflecting portal colopathy or colitis. Electronically signed by: Franky Crease MD 10/10/2024 01:47 AM EST RP Workstation: [id number]   CT Head Wo Contrast Result Date: 10/10/2024 EXAM: CT HEAD WITHOUT CONTRAST 10/10/2024 01:43:16 AM TECHNIQUE: CT of the head was performed without the administration of intravenous contrast. Automated exposure control, iterative reconstruction, and/or weight based adjustment of the mA/kV was utilized to reduce the radiation dose to as low as reasonably achievable. COMPARISON: 08/28/2024 CLINICAL HISTORY: Altered mental status FINDINGS: BRAIN AND VENTRICLES: No acute hemorrhage. No evidence of acute infarct. No hydrocephalus. No extra-axial collection. No mass effect or midline shift. ORBITS: No acute abnormality. SINUSES: No acute abnormality. SOFT TISSUES AND SKULL: No acute soft tissue abnormality. No skull fracture. IMPRESSION: 1. No acute intracranial abnormality. Electronically signed by: Franky Stanford MD 10/10/2024 01:45 AM EST RP Workstation: [id number]    Microbiology: Results for orders placed or performed during the hospital encounter of 10/09/24  MRSA Next Gen by PCR, Nasal     Status: None   Collection Time: 10/10/24  4:17 AM    Specimen: Nasal Mucosa; Nasal Swab  Result Value Ref Range Status   MRSA by PCR Next Gen NOT DETECTED NOT DETECTED Final    Comment: (NOTE) The GeneXpert MRSA Assay (  FDA approved for NASAL specimens only), is one component of a comprehensive MRSA colonization surveillance program. It is not intended to diagnose MRSA infection nor to guide or monitor treatment for MRSA infections. Test performance is not FDA approved in patients less than 41 years old. Performed at [facility], [address], [place], [place] [address]     Labs: CBC: Recent Labs  Lab 10/17/24 1055 10/18/24 0536 10/19/24 0405 10/20/24 0550 10/21/24 0503  WBC 11.4* 10.2 9.4 10.3 10.5  NEUTROABS 8.0* 5.9 5.5 6.4 6.5  HGB 8.5* 8.2* 8.3* 8.2* 8.4*  HCT 25.6* 23.7* 24.6* 23.4* 24.3*  MCV 93.4 90.8 92.1 90.3 90.0  PLT 165 150 150 178 182   Basic Metabolic Panel: Recent Labs  Lab 10/17/24 1055 10/18/24 0536 10/19/24 0405 10/20/24 0550 10/21/24 0503  NA 128* 127* 128* 127* 128*  K 3.8 3.9 3.9 3.7 3.9  CL 100 98 100 99 99  CO2 19* 20* 17* 16* 20*  GLUCOSE 205* 111* 108* 112* 90  BUN 52* 55* 53* 51* 48*  CREATININE 2.24* 2.56* 2.70* 2.58* 2.45*  CALCIUM  8.2* 8.1* 8.0* 8.0* 8.3*  MG 2.2 2.3 2.2 2.2 2.3   Liver Function Tests: Recent Labs  Lab 10/17/24 1055 10/18/24 0536 10/19/24 0405 10/20/24 0550 10/21/24 0503  AST 36 33 37 42* 39  ALT 17 17 16 18 18   ALKPHOS 100 100 115 134* 140*  BILITOT 1.1 1.1 1.0 1.1 1.2  PROT 5.5* 5.2* 5.2* 5.4* 5.8*  ALBUMIN 2.8* 2.6* 2.5* 2.5* 2.7*   CBG: Recent Labs  Lab 10/21/24 0641 10/21/24 0747 10/21/24 1143 10/21/24 1624 10/22/24 0702  GLUCAP 105* 101* 145* 129* 103*    Discharge time spent: 35 minutes.  Signed: Elgin Lam, MD [facility] 10/23/2024 "

## 2024-10-23 NOTE — Telephone Encounter (Signed)
 Copied from CRM #[id number]. Topic: Clinical - Home Health Verbal Orders >> Oct 23, 2024 11:24 AM Rea ORN wrote: Caller/Agency: [facility]  Callback Number: [phone], may leave voicemail on secure line Service Requested: Physical Therapy Frequency: 1 week  for 9 weeks Any new concerns about the patient? No

## 2024-10-23 NOTE — Transitions of Care (Post Inpatient/ED Visit) (Signed)
" ° °  10/23/2024  Name: Susan [name] MRN: [id number] DOB: 08/28/1966  Today's TOC FU Call Status: Today's TOC FU Call Status:: Unsuccessful Call (1st Attempt) Unsuccessful Call (1st Attempt) Date: 10/23/24  Attempted to reach the patient regarding the most recent Inpatient/ED visit.  Follow Up Plan: Additional outreach attempts will be made to reach the patient to complete the Transitions of Care (Post Inpatient/ED visit) call.    Olam Ku, RN, BSN [place]  [facility], [facility] RN Care Manager Direct Dial: [phone]  Fax: [phone]   "

## 2024-10-26 ENCOUNTER — Telehealth: Payer: Self-pay | Admitting: Family

## 2024-10-26 ENCOUNTER — Telehealth: Payer: Self-pay | Admitting: *Deleted

## 2024-10-26 NOTE — Telephone Encounter (Signed)
 Verbal orders have been given

## 2024-10-26 NOTE — Telephone Encounter (Signed)
 Copied from CRM #[id number]. Topic: Clinical - Home Health Verbal Orders >> Oct 26, 2024 11:06 AM Maisie BROCKS wrote: Caller/Agency: [facility] Callback Number: [phone] (secured VM) Service Requested: Physical Therapy and [facility]  Frequency: every other week Any new concerns about the patient? Yes, just in the hospital for liver function, they referred her for [facility]

## 2024-10-28 ENCOUNTER — Other Ambulatory Visit (HOSPITAL_COMMUNITY): Payer: Self-pay | Admitting: Nurse Practitioner

## 2024-10-28 ENCOUNTER — Telehealth: Payer: Self-pay

## 2024-10-28 ENCOUNTER — Telehealth: Payer: Self-pay | Admitting: *Deleted

## 2024-10-28 DIAGNOSIS — R188 Other ascites: Secondary | ICD-10-CM

## 2024-10-28 NOTE — Transitions of Care (Post Inpatient/ED Visit) (Signed)
" ° °  10/28/2024  Name: Susan [name] MRN: [id number] DOB: 1966/09/06  Today's TOC FU Call Status: Today's TOC FU Call Status:: Unsuccessful Call (3rd Attempt) Unsuccessful Call (3rd Attempt) Date: 10/28/24  Attempted to reach the patient regarding the most recent Inpatient/ED visit.  Follow Up Plan: No further outreach attempts will be made at this time. We have been unable to contact the patient.  Arvin Seip RN, BSN, CCM [employer], Population Health Case Manager Phone: [phone]  "

## 2024-10-28 NOTE — Transitions of Care (Post Inpatient/ED Visit) (Signed)
" ° °  10/28/2024  Name: Susan [name] MRN: [id number] DOB: [birth year]  Today's TOC FU Call Status: Today's TOC FU Call Status:: Unsuccessful Call (3rd Attempt) Unsuccessful Call (3rd Attempt) Date: 10/28/24 RNCM returned a call left to voice message.  Attempted to reach the patient regarding the most recent Inpatient/ED visit.  Follow Up Plan: No further outreach attempts will be made at this time. We have been unable to contact the patient.   Olam Ku, RN, BSN, MSN [facility], [facility] RN Care Manager Direct Dial: [phone]  Fax: [phone]   "

## 2024-11-02 ENCOUNTER — Ambulatory Visit (HOSPITAL_COMMUNITY)

## 2024-11-13 ENCOUNTER — Ambulatory Visit: Admitting: Gastroenterology
